# Patient Record
Sex: Female | Born: 1950 | Race: White | Hispanic: No | Marital: Married | Smoking: Former smoker
Health system: Southern US, Community
[De-identification: ages and names within clinical notes are randomized; demographics above are authoritative.]

## PROBLEM LIST (undated history)

## (undated) DIAGNOSIS — J449 Chronic obstructive pulmonary disease, unspecified: Secondary | ICD-10-CM

## (undated) DIAGNOSIS — K5090 Crohn's disease, unspecified, without complications: Secondary | ICD-10-CM

## (undated) DIAGNOSIS — I10 Essential (primary) hypertension: Secondary | ICD-10-CM

## (undated) DIAGNOSIS — M549 Dorsalgia, unspecified: Secondary | ICD-10-CM

## (undated) DIAGNOSIS — J45909 Unspecified asthma, uncomplicated: Secondary | ICD-10-CM

## (undated) HISTORY — PX: CATARACT EXTRACTION, BILATERAL: SHX1313

## (undated) HISTORY — PX: FRACTURE SURGERY: SHX138

## (undated) HISTORY — PX: ABDOMINAL HYSTERECTOMY: SHX81

---

## 2000-11-02 ENCOUNTER — Emergency Department (HOSPITAL_COMMUNITY): Admission: EM | Admit: 2000-11-02 | Discharge: 2000-11-02 | Payer: Self-pay | Admitting: Emergency Medicine

## 2002-05-01 ENCOUNTER — Emergency Department (HOSPITAL_COMMUNITY): Admission: EM | Admit: 2002-05-01 | Discharge: 2002-05-01 | Payer: Self-pay | Admitting: Emergency Medicine

## 2002-05-01 ENCOUNTER — Encounter: Payer: Self-pay | Admitting: Emergency Medicine

## 2002-05-24 ENCOUNTER — Encounter: Admission: RE | Admit: 2002-05-24 | Discharge: 2002-05-24 | Payer: Self-pay | Admitting: Family Medicine

## 2002-05-24 ENCOUNTER — Encounter: Payer: Self-pay | Admitting: Family Medicine

## 2002-07-02 ENCOUNTER — Encounter: Admission: RE | Admit: 2002-07-02 | Discharge: 2002-07-02 | Payer: Self-pay | Admitting: Occupational Medicine

## 2002-07-02 ENCOUNTER — Encounter: Payer: Self-pay | Admitting: Occupational Medicine

## 2002-08-05 ENCOUNTER — Encounter: Payer: Self-pay | Admitting: Occupational Medicine

## 2002-08-05 ENCOUNTER — Encounter: Admission: RE | Admit: 2002-08-05 | Discharge: 2002-08-05 | Payer: Self-pay | Admitting: Occupational Medicine

## 2002-08-21 ENCOUNTER — Encounter: Payer: Self-pay | Admitting: Occupational Medicine

## 2002-08-21 ENCOUNTER — Encounter: Admission: RE | Admit: 2002-08-21 | Discharge: 2002-08-21 | Payer: Self-pay | Admitting: Occupational Medicine

## 2002-09-09 ENCOUNTER — Encounter: Admission: RE | Admit: 2002-09-09 | Discharge: 2002-10-02 | Payer: Self-pay | Admitting: Occupational Medicine

## 2002-10-24 ENCOUNTER — Encounter: Admission: RE | Admit: 2002-10-24 | Discharge: 2002-10-24 | Payer: Self-pay | Admitting: Occupational Medicine

## 2002-10-24 ENCOUNTER — Encounter: Payer: Self-pay | Admitting: Occupational Medicine

## 2002-11-08 ENCOUNTER — Encounter: Payer: Self-pay | Admitting: Neurosurgery

## 2002-11-08 ENCOUNTER — Ambulatory Visit (HOSPITAL_COMMUNITY): Admission: RE | Admit: 2002-11-08 | Discharge: 2002-11-08 | Payer: Self-pay | Admitting: Neurosurgery

## 2009-11-05 ENCOUNTER — Ambulatory Visit: Payer: Self-pay | Admitting: Interventional Radiology

## 2009-11-05 ENCOUNTER — Emergency Department (HOSPITAL_BASED_OUTPATIENT_CLINIC_OR_DEPARTMENT_OTHER): Admission: EM | Admit: 2009-11-05 | Discharge: 2009-11-05 | Payer: Self-pay | Admitting: Emergency Medicine

## 2010-12-16 LAB — CBC
HCT: 39.4 % (ref 36.0–46.0)
MCHC: 34.6 g/dL (ref 30.0–36.0)
MCV: 92.9 fL (ref 78.0–100.0)
Platelets: 291 10*3/uL (ref 150–400)
RDW: 12 % (ref 11.5–15.5)
WBC: 12.8 10*3/uL — ABNORMAL HIGH (ref 4.0–10.5)

## 2010-12-16 LAB — DIFFERENTIAL
Basophils Relative: 1 % (ref 0–1)
Eosinophils Absolute: 0.1 10*3/uL (ref 0.0–0.7)
Eosinophils Relative: 1 % (ref 0–5)
Neutrophils Relative %: 73 % (ref 43–77)

## 2010-12-16 LAB — BASIC METABOLIC PANEL
Calcium: 8.7 mg/dL (ref 8.4–10.5)
GFR calc Af Amer: >60 mL/min (ref >60)
GFR calc non Af Amer: >60 mL/min (ref >60)
Sodium: 142 mEq/L (ref 135–145)

## 2016-07-13 ENCOUNTER — Emergency Department (HOSPITAL_COMMUNITY): Payer: BLUE CROSS/BLUE SHIELD

## 2016-07-13 ENCOUNTER — Encounter (HOSPITAL_COMMUNITY): Payer: Self-pay | Admitting: Emergency Medicine

## 2016-07-13 ENCOUNTER — Observation Stay (HOSPITAL_COMMUNITY)
Admission: EM | Admit: 2016-07-13 | Discharge: 2016-07-15 | Disposition: A | Discharge status: Home / Self Care | Payer: BLUE CROSS/BLUE SHIELD | Attending: Internal Medicine | Admitting: Internal Medicine

## 2016-07-13 DIAGNOSIS — J9602 Acute respiratory failure with hypercapnia: Secondary | ICD-10-CM

## 2016-07-13 DIAGNOSIS — Z79899 Other long term (current) drug therapy: Secondary | ICD-10-CM | POA: Insufficient documentation

## 2016-07-13 DIAGNOSIS — J45909 Unspecified asthma, uncomplicated: Secondary | ICD-10-CM | POA: Diagnosis not present

## 2016-07-13 DIAGNOSIS — Z7982 Long term (current) use of aspirin: Secondary | ICD-10-CM | POA: Diagnosis not present

## 2016-07-13 DIAGNOSIS — K5090 Crohn's disease, unspecified, without complications: Secondary | ICD-10-CM | POA: Insufficient documentation

## 2016-07-13 DIAGNOSIS — F17200 Nicotine dependence, unspecified, uncomplicated: Secondary | ICD-10-CM | POA: Insufficient documentation

## 2016-07-13 DIAGNOSIS — J9621 Acute and chronic respiratory failure with hypoxia: Principal | ICD-10-CM | POA: Insufficient documentation

## 2016-07-13 DIAGNOSIS — I10 Essential (primary) hypertension: Secondary | ICD-10-CM

## 2016-07-13 DIAGNOSIS — E785 Hyperlipidemia, unspecified: Secondary | ICD-10-CM | POA: Diagnosis not present

## 2016-07-13 DIAGNOSIS — J441 Chronic obstructive pulmonary disease with (acute) exacerbation: Secondary | ICD-10-CM | POA: Diagnosis present

## 2016-07-13 DIAGNOSIS — J9601 Acute respiratory failure with hypoxia: Secondary | ICD-10-CM | POA: Diagnosis present

## 2016-07-13 HISTORY — DX: Crohn's disease, unspecified, without complications: K50.90

## 2016-07-13 HISTORY — DX: Essential (primary) hypertension: I10

## 2016-07-13 HISTORY — DX: Unspecified asthma, uncomplicated: J45.909

## 2016-07-13 HISTORY — DX: Chronic obstructive pulmonary disease, unspecified: J44.9

## 2016-07-13 HISTORY — DX: Dorsalgia, unspecified: M54.9

## 2016-07-13 NOTE — ED Triage Notes (Signed)
Pt transported from home by [facility] for c/o shob all day worsening tonight. Pt has used MDI at home several times without relief.  EMS noted significant exp wheezing pt states CPAP has helped her in the past, pt placed on CPAP enroute. IV est #18 R AC, Mag 2gm, Solumedrol 125mg  given. Albuterol 10/Atr .5 given.  Pt states she is feeling better on arrival.

## 2016-07-13 NOTE — ED Provider Notes (Signed)
[facility]-EMERGENCY DEPT Provider Note   CSN: [id number] Arrival date & time: 07/13/16  2257   By signing my name below, I, Freida Busman, attest that this documentation has been prepared under the direction and in the presence of Tomasita Crumble, MD . Electronically Signed: Freida Busman, Scribe. 07/13/2016. 11:39 PM.   History   Chief Complaint Chief Complaint  Patient presents with  . Shortness of Breath    The history is provided by the patient and the EMS personnel. No language interpreter was used.     HPI Comments:  Sally [name] is a 65 y.o. female with a history of COPD who presents to the Emergency Department via EMS complaining of gradually worsening SOB which began today. She has associated wheezing and cough. Pt used  4 puffs of her inhaler without relief. EMS reports oxygen saturation of 93% on fire departmenst O2 upon EMS arrival to the patient. Pt was given magnesium, solumedrol, albuterol, and Atrovent en route and was placed on CPAP with moderate improvement. Pt denies fever. Her only other complaint at this time is burning abdominal pain.    Past Medical History:  Diagnosis Date  . Asthma   . Back pain   . COPD (chronic obstructive pulmonary disease) (HCC)   . Crohn disease (HCC)   . Hypertension     There are no active problems to display for this patient.   Past Surgical History:  Procedure Laterality Date  . ABDOMINAL HYSTERECTOMY    . CATARACT EXTRACTION, BILATERAL    . FRACTURE SURGERY Left    ARM    OB History    No data available       Home Medications    Prior to Admission medications   Not on File    Family History No family history on file.  Social History Social History  Substance Use Topics  . Smoking status: Current Every Day Smoker  . Smokeless tobacco: Never Used  . Alcohol use No     Allergies   Review of patient's allergies indicates no known allergies.   Review of Systems Review of Systems 10 systems reviewed and  all are negative for acute change except as noted in the HPI.  Physical Exam Updated Vital Signs BP 138/76 (BP Location: Right Arm)   Pulse 104   Temp 98.8 F (37.1 C) (Axillary)   Resp (!) 29   Ht 5\' 1"  (1.549 m)   Wt 168 lb (76.2 kg)   SpO2 100%   BMI 31.74 kg/m   Physical Exam  Constitutional: She is oriented to person, place, and time. She appears well-developed and well-nourished. No distress.  HENT:  Head: Normocephalic and atraumatic.  Nose: Nose normal.  Mouth/Throat: Oropharynx is clear and moist. No oropharyngeal exudate.  Eyes: Conjunctivae and EOM are normal. Pupils are equal, round, and reactive to light. No scleral icterus.  Neck: Normal range of motion. Neck supple. No JVD present. No tracheal deviation present. No thyromegaly present.  Cardiovascular: Regular rhythm and normal heart sounds.  Tachycardia present.  Exam reveals no gallop and no friction rub.   No murmur heard. Pulmonary/Chest: Tachypnea noted. She has wheezes. She exhibits no tenderness.  Bipap mask in place Bilateral expiratorywheezing   Abdominal: Soft. Bowel sounds are normal. She exhibits no distension and no mass. There is no tenderness. There is no rebound and no guarding.  Musculoskeletal: Normal range of motion. She exhibits no edema or tenderness.  Lymphadenopathy:    She has no cervical adenopathy.  Neurological:  She is alert and oriented to person, place, and time. No cranial nerve deficit. She exhibits normal muscle tone.  Skin: Skin is warm and dry. No rash noted. No erythema. No pallor.  Nursing note and vitals reviewed.    ED Treatments / Results  DIAGNOSTIC STUDIES:  Oxygen Saturation is 100% on RA, normal by my interpretation.    COORDINATION OF CARE:  11:39 PM Discussed treatment plan with pt at bedside and pt agreed to plan.  Labs (all labs ordered are listed, but only abnormal results are displayed) Labs Reviewed  CBC WITH DIFFERENTIAL/PLATELET  BRAIN NATRIURETIC  PEPTIDE  COMPREHENSIVE METABOLIC PANEL  I-STAT TROPOININ, ED    EKG  EKG Interpretation  Date/Time:  Wednesday July 13 2016 23:09:09 EDT Ventricular Rate:  106 PR Interval:    QRS Duration: 108 QT Interval:  362 QTC Calculation: 481 R Axis:   2 Text Interpretation:  Sinus tachycardia No old tracing to compare Interpretation limited secondary to artifact Confirmed by Erroll Lunani, Rayjon Wery Ayokunle [phone]([id number]) on 07/13/2016 11:12:45 PM       Radiology No results found.  Procedures Procedures (including critical care time)  Medications Ordered in ED Medications - No data to display   Initial Impression / Assessment and Plan / ED Course  I have reviewed the triage vital signs and the nursing notes.  Pertinent labs & imaging results that were available during my care of the patient were reviewed by me and considered in my medical decision making (see chart for details).  Clinical Course    Patient presents to the ED for COPD exacerbation.  She has already received ideal treatment from EMS, including continuous albuterol, ipratropium, solumedrol and magnesium 2g.  She is currently on bipap and appears well but is still wheezing significantly.  Will call hospitalist for admission.  CXR negative for PNA.    2:16 AM [facility]  She is 90% on RA.  97% on 3L [place] .  Will admit to tele for further care.  CRITICAL CARE Performed by: Tomasita CrumbleNI,Mali Eppard   Total critical care time: 40 minutes - respiratory distress on bipap  Critical care time was exclusive of separately billable procedures and treating other patients.  Critical care was necessary to treat or prevent imminent or life-threatening deterioration.  Critical care was time spent personally by me on the following activities: development of treatment plan with patient and/or surrogate as well as nursing, discussions with consultants, evaluation of patient's response to treatment, examination of patient, obtaining history from  patient or surrogate, ordering and performing treatments and interventions, ordering and review of laboratory studies, ordering and review of radiographic studies, pulse oximetry and re-evaluation of patient's condition.   Final Clinical Impressions(s) / ED Diagnoses   Final diagnoses:  None    New Prescriptions New Prescriptions   No medications on file   I personally performed the services described in this documentation, which was scribed in my presence. The recorded information has been reviewed and is accurate.       Tomasita CrumbleAdeleke Marijean Montanye, MD 07/14/16 [phone]

## 2016-07-14 DIAGNOSIS — E785 Hyperlipidemia, unspecified: Secondary | ICD-10-CM

## 2016-07-14 DIAGNOSIS — J9601 Acute respiratory failure with hypoxia: Secondary | ICD-10-CM | POA: Diagnosis not present

## 2016-07-14 DIAGNOSIS — J441 Chronic obstructive pulmonary disease with (acute) exacerbation: Secondary | ICD-10-CM | POA: Diagnosis not present

## 2016-07-14 DIAGNOSIS — J9602 Acute respiratory failure with hypercapnia: Secondary | ICD-10-CM

## 2016-07-14 DIAGNOSIS — I10 Essential (primary) hypertension: Secondary | ICD-10-CM

## 2016-07-14 LAB — CBC WITH DIFFERENTIAL/PLATELET
BASOS ABS: 0 10*3/uL (ref 0.0–0.1)
BASOS PCT: 0 %
EOS ABS: 0.1 10*3/uL (ref 0.0–0.7)
EOS PCT: 1 %
HCT: 41.8 % (ref 36.0–46.0)
HEMOGLOBIN: 14.2 g/dL (ref 12.0–15.0)
LYMPHS ABS: 1.6 10*3/uL (ref 0.7–4.0)
Lymphocytes Relative: 7 %
MCH: 30.4 pg (ref 26.0–34.0)
MCHC: 34 g/dL (ref 30.0–36.0)
MCV: 89.5 fL (ref 78.0–100.0)
Monocytes Absolute: 0.6 10*3/uL (ref 0.1–1.0)
Monocytes Relative: 3 %
NEUTROS PCT: 89 %
Neutro Abs: 18.8 10*3/uL — ABNORMAL HIGH (ref 1.7–7.7)
PLATELETS: 362 10*3/uL (ref 150–400)
RBC: 4.67 MIL/uL (ref 3.87–5.11)
RDW: 13 % (ref 11.5–15.5)
WBC: 21.2 10*3/uL — AB (ref 4.0–10.5)

## 2016-07-14 LAB — COMPREHENSIVE METABOLIC PANEL
ALT: 23 U/L (ref 14–54)
AST: 25 U/L (ref 15–41)
Albumin: 4 g/dL (ref 3.5–5.0)
Alkaline Phosphatase: 151 U/L — ABNORMAL HIGH (ref 38–126)
Anion gap: 10 (ref 5–15)
BUN: 9 mg/dL (ref 6–20)
CHLORIDE: 101 mmol/L (ref 101–111)
CO2: 26 mmol/L (ref 22–32)
Calcium: 9.3 mg/dL (ref 8.9–10.3)
Creatinine, Ser: 0.92 mg/dL (ref 0.44–1.00)
GFR calc Af Amer: >60 mL/min (ref >60)
Glucose, Bld: 188 mg/dL — ABNORMAL HIGH (ref 65–99)
POTASSIUM: 3.6 mmol/L (ref 3.5–5.1)
SODIUM: 137 mmol/L (ref 135–145)
Total Bilirubin: 0.7 mg/dL (ref 0.3–1.2)
Total Protein: 7.7 g/dL (ref 6.5–8.1)

## 2016-07-14 LAB — I-STAT TROPONIN, ED: TROPONIN I, POC: 0.05 ng/mL (ref 0.00–0.08)

## 2016-07-14 LAB — BRAIN NATRIURETIC PEPTIDE: B NATRIURETIC PEPTIDE 5: 38.4 pg/mL (ref 0.0–100.0)

## 2016-07-14 MED ORDER — AZITHROMYCIN 250 MG PO TABS
250.0000 mg | ORAL_TABLET | Freq: Every day | ORAL | Status: DC
  Administered 2016-07-15: 250 mg via ORAL
  Filled 2016-07-14: qty 1

## 2016-07-14 MED ORDER — HYDROXYZINE PAMOATE 25 MG PO CAPS
25.0000 mg | ORAL_CAPSULE | Freq: Three times a day (TID) | ORAL | Status: DC | PRN
  Filled 2016-07-14: qty 1

## 2016-07-14 MED ORDER — INFLUENZA VAC SPLIT QUAD 0.5 ML IM SUSY
0.5000 mL | PREFILLED_SYRINGE | INTRAMUSCULAR | Status: AC
  Administered 2016-07-15: 0.5 mL via INTRAMUSCULAR
  Filled 2016-07-14: qty 0.5

## 2016-07-14 MED ORDER — METHYLPREDNISOLONE SODIUM SUCC 125 MG IJ SOLR
60.0000 mg | Freq: Three times a day (TID) | INTRAMUSCULAR | Status: DC
  Administered 2016-07-14 – 2016-07-15 (×3): 60 mg via INTRAVENOUS
  Filled 2016-07-14 (×3): qty 2

## 2016-07-14 MED ORDER — HYDROXYZINE HCL 25 MG PO TABS: 25.0000 mg | ORAL_TABLET | Freq: Three times a day (TID) | ORAL | Status: DC | PRN

## 2016-07-14 MED ORDER — ATORVASTATIN CALCIUM 10 MG PO TABS
20.0000 mg | ORAL_TABLET | Freq: Every day | ORAL | Status: DC
  Administered 2016-07-14: 20 mg via ORAL
  Filled 2016-07-14: qty 2

## 2016-07-14 MED ORDER — FLUTICASONE PROPIONATE HFA 110 MCG/ACT IN AERO: 2.0000 | INHALATION_SPRAY | Freq: Two times a day (BID) | RESPIRATORY_TRACT | Status: DC

## 2016-07-14 MED ORDER — DILTIAZEM HCL ER COATED BEADS 120 MG PO CP24
240.0000 mg | ORAL_CAPSULE | Freq: Every day | ORAL | Status: DC
  Administered 2016-07-14 – 2016-07-15 (×2): 240 mg via ORAL
  Filled 2016-07-14 (×2): qty 2

## 2016-07-14 MED ORDER — HYDROCODONE-ACETAMINOPHEN 5-325 MG PO TABS
1.0000 | ORAL_TABLET | ORAL | Status: AC | PRN
  Administered 2016-07-14 (×2): 1 via ORAL
  Filled 2016-07-14 (×2): qty 1

## 2016-07-14 MED ORDER — UMECLIDINIUM-VILANTEROL 62.5-25 MCG/INH IN AEPB
1.0000 | INHALATION_SPRAY | Freq: Every day | RESPIRATORY_TRACT | Status: DC
  Filled 2016-07-14: qty 14

## 2016-07-14 MED ORDER — ARFORMOTEROL TARTRATE 15 MCG/2ML IN NEBU
15.0000 ug | INHALATION_SOLUTION | Freq: Two times a day (BID) | RESPIRATORY_TRACT | Status: DC
  Administered 2016-07-14: 15 ug via RESPIRATORY_TRACT
  Filled 2016-07-14: qty 2

## 2016-07-14 MED ORDER — ASPIRIN EC 81 MG PO TBEC
81.0000 mg | DELAYED_RELEASE_TABLET | Freq: Every day | ORAL | Status: DC
  Administered 2016-07-14 – 2016-07-15 (×2): 81 mg via ORAL
  Filled 2016-07-14 (×2): qty 1

## 2016-07-14 MED ORDER — ACETAMINOPHEN 325 MG PO TABS
650.0000 mg | ORAL_TABLET | Freq: Four times a day (QID) | ORAL | Status: DC | PRN
  Administered 2016-07-14 – 2016-07-15 (×2): 650 mg via ORAL
  Filled 2016-07-14 (×2): qty 2

## 2016-07-14 MED ORDER — IPRATROPIUM-ALBUTEROL 0.5-2.5 (3) MG/3ML IN SOLN
3.0000 mL | Freq: Four times a day (QID) | RESPIRATORY_TRACT | Status: DC
  Administered 2016-07-14 – 2016-07-15 (×4): 3 mL via RESPIRATORY_TRACT
  Filled 2016-07-14 (×4): qty 3

## 2016-07-14 MED ORDER — DILTIAZEM HCL ER 240 MG PO CP24
240.0000 mg | ORAL_CAPSULE | Freq: Every day | ORAL | Status: DC
  Filled 2016-07-14: qty 1

## 2016-07-14 MED ORDER — PREDNISONE 20 MG PO TABS
50.0000 mg | ORAL_TABLET | Freq: Every day | ORAL | Status: DC
  Administered 2016-07-14: 50 mg via ORAL
  Filled 2016-07-14: qty 3

## 2016-07-14 MED ORDER — AZITHROMYCIN 250 MG PO TABS
500.0000 mg | ORAL_TABLET | Freq: Every day | ORAL | Status: AC
  Administered 2016-07-14: 500 mg via ORAL
  Filled 2016-07-14: qty 2

## 2016-07-14 MED ORDER — ALBUTEROL SULFATE (2.5 MG/3ML) 0.083% IN NEBU
2.5000 mg | INHALATION_SOLUTION | Freq: Four times a day (QID) | RESPIRATORY_TRACT | Status: DC | PRN
Start: 2016-07-14 — End: 2016-07-14

## 2016-07-14 MED ORDER — GUAIFENESIN ER 600 MG PO TB12
600.0000 mg | ORAL_TABLET | Freq: Two times a day (BID) | ORAL | Status: DC
  Administered 2016-07-14 – 2016-07-15 (×3): 600 mg via ORAL
  Filled 2016-07-14 (×3): qty 1

## 2016-07-14 MED ORDER — ONDANSETRON HCL 4 MG/2ML IJ SOLN
4.0000 mg | Freq: Four times a day (QID) | INTRAMUSCULAR | Status: DC | PRN
  Administered 2016-07-14 – 2016-07-15 (×2): 4 mg via INTRAVENOUS
  Filled 2016-07-14 (×2): qty 2

## 2016-07-14 MED ORDER — ENOXAPARIN SODIUM 40 MG/0.4ML ~~LOC~~ SOLN
40.0000 mg | SUBCUTANEOUS | Status: DC
  Administered 2016-07-14 – 2016-07-15 (×2): 40 mg via SUBCUTANEOUS
  Filled 2016-07-14 (×2): qty 0.4

## 2016-07-14 MED ORDER — ONDANSETRON HCL 4 MG/2ML IJ SOLN
4.0000 mg | Freq: Once | INTRAMUSCULAR | Status: AC
  Administered 2016-07-14: 4 mg via INTRAVENOUS
  Filled 2016-07-14: qty 2

## 2016-07-14 MED ORDER — ALBUTEROL SULFATE (2.5 MG/3ML) 0.083% IN NEBU: 2.5000 mg | INHALATION_SOLUTION | RESPIRATORY_TRACT | Status: DC | PRN

## 2016-07-14 MED ORDER — BUDESONIDE 0.5 MG/2ML IN SUSP
0.5000 mg | Freq: Two times a day (BID) | RESPIRATORY_TRACT | Status: DC
  Administered 2016-07-14 – 2016-07-15 (×3): 0.5 mg via RESPIRATORY_TRACT
  Filled 2016-07-14 (×3): qty 2

## 2016-07-14 MED ORDER — UMECLIDINIUM BROMIDE 62.5 MCG/INH IN AEPB
1.0000 | INHALATION_SPRAY | Freq: Every day | RESPIRATORY_TRACT | Status: DC
  Administered 2016-07-14: 1 via RESPIRATORY_TRACT
  Filled 2016-07-14: qty 7

## 2016-07-14 MED ORDER — ORAL CARE MOUTH RINSE
15.0000 mL | Freq: Two times a day (BID) | OROMUCOSAL | Status: DC
  Administered 2016-07-14: 15 mL via OROMUCOSAL

## 2016-07-14 NOTE — ED Notes (Signed)
Dr. Gardner at bedside 

## 2016-07-14 NOTE — ED Notes (Signed)
Patient stated she is feeling better and wants to go home  Dr Mora Bellman aware and he removed her oxygen to see if she de sats

## 2016-07-14 NOTE — ED Notes (Signed)
Patient nauseated at this time 

## 2016-07-14 NOTE — Progress Notes (Signed)
PROGRESS NOTE        PATIENT DETAILS Name: Sally [name] Age: 65 y.o. Sex: female Date of Birth: March 17, 1951 Admit Date: 07/13/2016 Admitting Physician [name], DO PCP:Pcp Not In System  Brief Narrative: Patient is a 65 y.o. female with history of COPD,HTN,'s, presented with worsening shortness of breath, thought to have COPD exacerbation. Initially  required BiPAP in the emergency room Admitted and started on bronchodilators, steroids and empiric Zithromax.   Subjective: Feels much better. Shortness of breath has markedly improved  No chest pain No nausea  Assessment/Plan: Acute respiratory failure with hypoxia due to COPD exacerbation: Markedly improved, claims that she is almost back to her baseline. Titrated off oxygen this morning. Still some wheezing. We will continue steroids-but changed to Solu-Medrol, continue nebulized bronchodilators and Zithromax. Suspect that if clinical improvement continues, she should be able to go home tomorrow morning. May need to assess for home O2 requirement on discharge.  Hypertension: Controlled, Continue Cardizem.  Dyslipidemia: Continue statin  Tobacco abuse: Counseled.  DVT Prophylaxis: Lovenox  Code Status: Full code  Family Communication: Spouse at bedside  Disposition Plan: Home 10/20  Antimicrobial agents: Azythromycin  Procedures: None  CONSULTS: None  Time spent: 40 minutes  MEDICATIONS: Anti-infectives    Start     Dose/Rate Route Frequency Ordered Stop   07/15/16 1000  azithromycin (ZITHROMAX) tablet 250 mg     250 mg Oral Daily 07/14/16 0302 07/19/16 0959   07/14/16 0600  azithromycin (ZITHROMAX) tablet 500 mg     500 mg Oral Daily 07/14/16 0302 07/14/16 0528      Scheduled Meds: . aspirin EC  81 mg Oral Daily  . atorvastatin  20 mg Oral QHS  . [START ON 07/15/2016] azithromycin  250 mg Oral Daily  . budesonide (PULMICORT) nebulizer solution  0.5 mg Nebulization BID   . diltiazem  240 mg Oral Daily  . enoxaparin (LOVENOX) injection  40 mg Subcutaneous Q24H  . guaiFENesin  600 mg Oral BID  . [START ON 07/15/2016] Influenza vac split quadrivalent PF  0.5 mL Intramuscular Tomorrow-1000  . ipratropium-albuterol  3 mL Nebulization Q6H  . mouth rinse  15 mL Mouth Rinse BID  . methylPREDNISolone (SOLU-MEDROL) injection  60 mg Intravenous Q8H   Continuous Infusions:  PRN Meds:.acetaminophen, albuterol, HYDROcodone-acetaminophen, hydrOXYzine, ondansetron (ZOFRAN) IV   PHYSICAL EXAM: Vital signs: Vitals:   07/14/16 0330 07/14/16 0406 07/14/16 0835 07/14/16 0935  BP: 129/70 (!) 152/55  123/89  Pulse: 94 95  90  Resp: 23 (!) 21  20  Temp:  98.5 F (36.9 C)  97.7 F (36.5 C)  TempSrc:  Oral  Oral  SpO2: 94% 93% 94% 90%  Weight:  76.2 kg (167 lb 15.9 oz)    Height:  5\' 1"  (1.549 m)     Filed Weights   07/13/16 2315 07/14/16 0406  Weight: 76.2 kg (168 lb) 76.2 kg (167 lb 15.9 oz)   Body mass index is 31.74 kg/m.   General appearance :Awake, alert, not in any distress. Speech Clear. Not toxic Looking Eyes:, pupils equally reactive to light and accomodation,no scleral icterus.Pink conjunctiva HEENT: Atraumatic and Normocephalic Neck: supple, no JVD. No cervical lymphadenopathy. No thyromegaly Resp: Bilateral wheezing. Good air entry. CVS: S1 S2 regular, no murmurs.  GI: Bowel sounds present, Non tender and not distended with no gaurding, rigidity or rebound.No organomegaly  Extremities: B/L Lower Ext shows no edema, both legs are warm to touch Neurology:  speech clear,Non focal, sensation is grossly intact. Psychiatric: Normal judgment and insight. Alert and oriented x 3. Normal mood. Musculoskeletal: No digital cyanosis Skin:No Rash, warm and dry Wounds:N/A  I have personally reviewed following labs and imaging studies  LABORATORY DATA: CBC:  Recent Labs Lab 07/13/16 2311  WBC 21.2*  NEUTROABS 18.8*  HGB 14.2  HCT 41.8  MCV 89.5  PLT  362    Basic Metabolic Panel:  Recent Labs Lab 07/13/16 2311  NA 137  K 3.6  CL 101  CO2 26  GLUCOSE 188*  BUN 9  CREATININE 0.92  CALCIUM 9.3    GFR: Estimated Creatinine Clearance: 57.7 mL/min (by C-G formula based on SCr of 0.92 mg/dL).  Liver Function Tests:  Recent Labs Lab 07/13/16 2311  AST 25  ALT 23  ALKPHOS 151*  BILITOT 0.7  PROT 7.7  ALBUMIN 4.0   No results for input(s): LIPASE, AMYLASE in the last 168 hours. No results for input(s): AMMONIA in the last 168 hours.  Coagulation Profile: No results for input(s): INR, PROTIME in the last 168 hours.  Cardiac Enzymes: No results for input(s): CKTOTAL, CKMB, CKMBINDEX, TROPONINI in the last 168 hours.  BNP (last 3 results) No results for input(s): PROBNP in the last 8760 hours.  HbA1C: No results for input(s): HGBA1C in the last 72 hours.  CBG: No results for input(s): GLUCAP in the last 168 hours.  Lipid Profile: No results for input(s): CHOL, HDL, LDLCALC, TRIG, CHOLHDL, LDLDIRECT in the last 72 hours.  Thyroid Function Tests: No results for input(s): TSH, T4TOTAL, FREET4, T3FREE, THYROIDAB in the last 72 hours.  Anemia Panel: No results for input(s): VITAMINB12, FOLATE, FERRITIN, TIBC, IRON, RETICCTPCT in the last 72 hours.  Urine analysis: No results found for: COLORURINE, APPEARANCEUR, LABSPEC, PHURINE, GLUCOSEU, HGBUR, BILIRUBINUR, KETONESUR, PROTEINUR, UROBILINOGEN, NITRITE, LEUKOCYTESUR  Sepsis Labs: Lactic Acid, Venous No results found for: LATICACIDVEN  MICROBIOLOGY: No results found for this or any previous visit (from the past 240 hour(s)).  RADIOLOGY STUDIES/RESULTS: Dg Chest Port 1 View  Result Date: 07/13/2016 CLINICAL DATA:  Dyspnea EXAM: PORTABLE CHEST 1 VIEW COMPARISON:  08/31/2015 FINDINGS: The heart size and mediastinal contours are within normal limits. The lungs are hyperinflated without pneumonic consolidation, pneumothorax nor CHF. No pleural effusion. Partially  visualized left humeral intramedullary nail. No acute osseous abnormality. IMPRESSION: Hyperinflated lungs without acute infiltrate or CHF. Electronically Signed   By: Tollie Ethavid  Kwon M.D.   On: 07/13/2016 23:51     LOS: 0 days   Gerald DexterLogan Scherer, PA-S  [facility] Pager:[phone]  If 7PM-7AM, please contact night-coverage [email or web address] Password [email or web address] 07/14/2016, 11:47 AM  Attending MD note  Patient was seen, examined,treatment plan was discussed with the PA-S.  I have personally reviewed the clinical findings, lab, imaging studies and management of this patient in detail. I agree with the documentation, as recorded by the PA-S   Patient is markedly improved, claims she is almost back to her baseline. Still with some wheezing but moving air well.  On Exam: Gen. exam: Awake, alert, not in any distress Chest: Good air entry bilaterally, no rhonchi or rales CVS: S1-S2 regular, no murmurs Abdomen: Soft, nontender and nondistended Neurology: Non-focal Skin: No rash or lesions  Plan Continue steroids/bronchodilators/Zithromax for another day-suspect that if clinical improvement continues she will be ready to discharge tomorrow morning  Rest as above  [facility]IM[facility],Lenette Rau [facility]

## 2016-07-14 NOTE — Progress Notes (Signed)
Taken off of Bipap at this time and placed on nasal cannula at 3L and pt tolerating well. RT to monitor as needed.

## 2016-07-14 NOTE — ED Notes (Addendum)
Patient sats 88-89% on RA  [place] 2 liters reapplied  [place] sandwich, etc and hot chocolate given upon request

## 2016-07-14 NOTE — Progress Notes (Addendum)
New Admission Note:  Arrival Method: Stretcher from ED Mental Orientation: A&O x4 Telemetry: [address], CCMD notified with second verifier Assessment: Completed Skin: Assessed with Burley Saver, RN, intact with multiple moles on back IV: R AC, SL Pain: 6/10, lower back, on call notified for pain medication Tubes: 2L of oxygen via nasal cannula Safety Measures: Safety Fall Prevention Plan was given, discussed and signed. Admission: Completed. RN unable to complete Abuse/Neglect assessment due to patient's husband at bedside 6 [place] Orientation: Patient has been orientated to the room, unit and the staff. Family: Husband at bedside.  Patient placed on continuous pulse ox. Orders have been reviewed and implemented. Will continue to monitor the patient. Call light has been placed within reach.  Rivka Barbara BSN, RN  Phone Number: [phone]

## 2016-07-14 NOTE — H&P (Addendum)
History and Physical    Sally [name] UJW:[id number] DOB: May 06, 1951 DOA: 07/13/2016   PCP: Pcp Not In System Chief Complaint:  Chief Complaint  Patient presents with  . Shortness of Breath    HPI: Sally [name] is a 65 y.o. female with medical history significant of COPD, HTN.  Patient presents to the ED with c/o cough, SOB, wheezing.  Symptoms onset earlier today, have been worsening.  Other family members have URI illnesses.  Symptoms are persistent and worsening since onset.  Has tried albuterol for symptoms at home without relief.  ED Course: Given mag, solumedrol, albuterol, atrovent, BIPAP.  Has had significant improvement and has now been weaned down to 3L via [place] (although at baseline she does not use oxygen at home).  Review of Systems: As per HPI otherwise 10 point review of systems negative.    Past Medical History:  Diagnosis Date  . Asthma   . Back pain   . COPD (chronic obstructive pulmonary disease) (HCC)   . Crohn disease (HCC)   . Hypertension     Past Surgical History:  Procedure Laterality Date  . ABDOMINAL HYSTERECTOMY    . CATARACT EXTRACTION, BILATERAL    . FRACTURE SURGERY Left    ARM     reports that she has been smoking.  She has never used smokeless tobacco. She reports that she does not drink alcohol or use drugs.  No Known Allergies  No family history on file. No h/o COPD, other family members do seem to have URI symptoms over the past 1 week however.   Prior to Admission medications   Medication Sig Start Date End Date Taking? Authorizing Provider  albuterol (PROVENTIL) (2.5 MG/3ML) 0.083% nebulizer solution Take 2.5 mg by nebulization every 6 (six) hours as needed for wheezing or shortness of breath.  06/03/16  Yes Historical Provider, MD  ANORO ELLIPTA 62.5-25 MCG/INH AEPB Inhale 1 puff into the lungs daily.  06/30/16  Yes Historical Provider, MD  aspirin EC 81 MG tablet Take 81 mg by mouth daily.   Yes Historical Provider, MD    atorvastatin (LIPITOR) 20 MG tablet Take 20 mg by mouth at bedtime. 05/05/16  Yes Historical Provider, MD  DILT-XR 240 MG 24 hr capsule Take 240 mg by mouth daily. 06/30/16  Yes Historical Provider, MD  FLOVENT HFA 110 MCG/ACT inhaler Inhale 2 puffs into the lungs every 12 (twelve) hours.  06/30/16  Yes Historical Provider, MD  hydrOXYzine (VISTARIL) 25 MG capsule Take 25 mg by mouth 3 (three) times daily as needed for anxiety.  05/05/16  Yes Historical Provider, MD    Physical Exam: Vitals:   07/14/16 0130 07/14/16 0145 07/14/16 0200 07/14/16 0215  BP: 121/56 140/63 138/75 131/67  Pulse: 87 84 97 90  Resp: 20 21 25 20   Temp:      TempSrc:      SpO2: 94% 97% 93% 90%  Weight:      Height:          Constitutional: NAD, calm, comfortable Eyes: PERRL, lids and conjunctivae normal ENMT: Mucous membranes are moist. Posterior pharynx clear of any exudate or lesions.Normal dentition.  Neck: normal, supple, no masses, no thyromegaly Respiratory: Diffuse bilateral wheezes Cardiovascular: Regular rate and rhythm, no murmurs / rubs / gallops. No extremity edema. 2+ pedal pulses. No carotid bruits.  Abdomen: no tenderness, no masses palpated. No hepatosplenomegaly. Bowel sounds positive.  Musculoskeletal: no clubbing / cyanosis. No joint deformity upper and lower extremities. Good ROM, no contractures. Normal  muscle tone.  Skin: no rashes, lesions, ulcers. No induration Neurologic: CN 2-12 grossly intact. Sensation intact, DTR normal. Strength 5/5 in all 4.  Psychiatric: Normal judgment and insight. Alert and oriented x 3. Normal mood.    Labs on Admission: I have personally reviewed following labs and imaging studies  CBC:  Recent Labs Lab 07/13/16 2311  WBC 21.2*  NEUTROABS 18.8*  HGB 14.2  HCT 41.8  MCV 89.5  PLT 362   Basic Metabolic Panel:  Recent Labs Lab 07/13/16 2311  NA 137  K 3.6  CL 101  CO2 26  GLUCOSE 188*  BUN 9  CREATININE 0.92  CALCIUM 9.3    GFR: Estimated Creatinine Clearance: 57.7 mL/min (by C-G formula based on SCr of 0.92 mg/dL). Liver Function Tests:  Recent Labs Lab 07/13/16 2311  AST 25  ALT 23  ALKPHOS 151*  BILITOT 0.7  PROT 7.7  ALBUMIN 4.0   No results for input(s): LIPASE, AMYLASE in the last 168 hours. No results for input(s): AMMONIA in the last 168 hours. Coagulation Profile: No results for input(s): INR, PROTIME in the last 168 hours. Cardiac Enzymes: No results for input(s): CKTOTAL, CKMB, CKMBINDEX, TROPONINI in the last 168 hours. BNP (last 3 results) No results for input(s): PROBNP in the last 8760 hours. HbA1C: No results for input(s): HGBA1C in the last 72 hours. CBG: No results for input(s): GLUCAP in the last 168 hours. Lipid Profile: No results for input(s): CHOL, HDL, LDLCALC, TRIG, CHOLHDL, LDLDIRECT in the last 72 hours. Thyroid Function Tests: No results for input(s): TSH, T4TOTAL, FREET4, T3FREE, THYROIDAB in the last 72 hours. Anemia Panel: No results for input(s): VITAMINB12, FOLATE, FERRITIN, TIBC, IRON, RETICCTPCT in the last 72 hours. Urine analysis: No results found for: COLORURINE, APPEARANCEUR, LABSPEC, PHURINE, GLUCOSEU, HGBUR, BILIRUBINUR, KETONESUR, PROTEINUR, UROBILINOGEN, NITRITE, LEUKOCYTESUR Sepsis Labs: @LABRCNTIP (procalcitonin:4,lacticidven:4) )No results found for this or any previous visit (from the past 240 hour(s)).   Radiological Exams on Admission: Dg Chest Port 1 View  Result Date: 07/13/2016 CLINICAL DATA:  Dyspnea EXAM: PORTABLE CHEST 1 VIEW COMPARISON:  08/31/2015 FINDINGS: The heart size and mediastinal contours are within normal limits. The lungs are hyperinflated without pneumonic consolidation, pneumothorax nor CHF. No pleural effusion. Partially visualized left humeral intramedullary nail. No acute osseous abnormality. IMPRESSION: Hyperinflated lungs without acute infiltrate or CHF. Electronically Signed   By: Tollie Eth M.D.   On: 07/13/2016  23:51    EKG: Independently reviewed.  Assessment/Plan Principal Problem:   Acute respiratory failure with hypoxia (HCC) Active Problems:   COPD exacerbation (HCC)    1. COPD exacerbation - causing acute respiratory failure with hypoxia, new oxygen requirement 1. Adult wheeze protocol 2. Prednisone 3. z pak 4. Continuous pulse ox   DVT prophylaxis: Lovenox Code Status: Full Family Communication: Husband at bedside Consults called: None Admission status: Place in [place], [place] Iles. DO [facility] Pager [phone] from 7PM-7AM  If 7AM-7PM, please contact the day physician for the patient [email or web address] Password [email or web address]  07/14/2016, 3:03 AM

## 2016-07-15 DIAGNOSIS — J441 Chronic obstructive pulmonary disease with (acute) exacerbation: Secondary | ICD-10-CM | POA: Diagnosis not present

## 2016-07-15 DIAGNOSIS — J9601 Acute respiratory failure with hypoxia: Secondary | ICD-10-CM | POA: Diagnosis not present

## 2016-07-15 DIAGNOSIS — I10 Essential (primary) hypertension: Secondary | ICD-10-CM | POA: Diagnosis not present

## 2016-07-15 DIAGNOSIS — E785 Hyperlipidemia, unspecified: Secondary | ICD-10-CM | POA: Diagnosis not present

## 2016-07-15 LAB — CBC
HCT: 40.8 % (ref 36.0–46.0)
Hemoglobin: 13.3 g/dL (ref 12.0–15.0)
MCH: 29.8 pg (ref 26.0–34.0)
MCHC: 32.6 g/dL (ref 30.0–36.0)
MCV: 91.3 fL (ref 78.0–100.0)
PLATELETS: 371 10*3/uL (ref 150–400)
RBC: 4.47 MIL/uL (ref 3.87–5.11)
RDW: 13.1 % (ref 11.5–15.5)
WBC: 16 10*3/uL — AB (ref 4.0–10.5)

## 2016-07-15 MED ORDER — PREDNISONE 10 MG PO TABS: ORAL_TABLET | ORAL | 0 refills | Status: DC

## 2016-07-15 MED ORDER — AZITHROMYCIN 250 MG PO TABS: 250.0000 mg | ORAL_TABLET | Freq: Every day | ORAL | 0 refills | Status: DC

## 2016-07-15 NOTE — Care Management Note (Signed)
Case Management Note  Patient Details  Name: Sally [name] MRN: [id number] Date of Birth: 02-12-1951  Subjective/Objective:     CM following for progression and d/c planning.                Action/Plan: 07/15/2016 Noted order for home oxygen, met with pt who has used oxygen previously from [facility] and wishes to use that agency again. This CM notified [facility] of plan for pt to d/c and need for home oxygen, qualifying sats in the notes and pt has diagnosis of COPD. No other HH needs identified.   Expected Discharge Date:    07/15/2016              Expected Discharge Plan:  Home/Self Care  In-House Referral:  NA  Discharge planning Services  CM Consult  Post Acute Care Choice:  Durable Medical Equipment Choice offered to:  Patient  DME Arranged:  Oxygen DME Agency:  [place]:  NA [place] Agency:  NA  Status of Service:  Completed, signed off  If discussed at [place] of Stay Meetings, dates discussed:    Additional Comments:  Adron Bene, RN 07/15/2016, 12:24 PM

## 2016-07-15 NOTE — Final Progress Note (Signed)
Patient discharge teaching given, including activity, diet, follow-up appoints, and medications. Patient verbalized understanding of all discharge instructions. IV access was d/c'd. Vitals are stable. Skin is intact except as charted in most recent assessments. Pt to be escorted out by NT, to be driven home by family.  Chris-RN 

## 2016-07-15 NOTE — Progress Notes (Signed)
SATURATION QUALIFICATIONS: (This note is used to comply with regulatory documentation for home oxygen)  Patient Saturations on Room Air at Rest = 92%  Patient Saturations on Room Air while Ambulating = 85%  Patient Saturations on 2 Liters of oxygen while Ambulating = 94%  Please briefly explain why patient needs home oxygen: Patient desats while ambulating.

## 2016-07-15 NOTE — Discharge Summary (Addendum)
PATIENT DETAILS Name: Sally [name] Age: 65 y.o. Sex: female Date of Birth: 1951-02-06 MRN: [id number]. Admitting Physician: Hillary BowJared M Gardner, DO PCP:Pcp Not In System  Admit Date: 07/13/2016 Discharge date: 07/15/2016  Recommendations for Outpatient Follow-up:  1. Follow up with PCP in 1 week to evaluate COPD exacerbation and HTN 2. Qualifies for  home oxygen 3. Please obtain BMP/CBC in one week  Admitted From:  Home  Disposition: Home   Home Health: No  Equipment/Devices: None  Discharge Condition: Stable  CODE STATUS: FULL CODE  Diet recommendation:  Heart Healthy  Brief Summary: See H&P, Labs, Consult and Test reports for all details. In brief, patient is 65 y.o. female with history of COPD and HTN who presented with worsening shortness of breath, thought to have COPD exacerbation. Initially required BiPAP in the emergency room. Admitted and started on bronchodilators, steroids and empiric Zithromax. Patient has rapidly improved and claims to have returned to baseline.   Brief Hospital Course: Acute on chronic respiratory failure with hypoxia due to COPD exacerbation: Markedly improved. Titrated off oxygen and ambulated yesterday. Claims that she is back to her baseline. Will send home with Zithromax and tapering prednisone. Patient claims she will follow-up with her pulmonologist next week  Chronic hypoxemic respiratory failure: Secondary to long-standing COPD, qualifies for home O2-have ordered. Have counseled patient regarding importance of not smoking while she is on oxygen.  Hypertension: Occasional elevated SBP. Continue Cardizem. Follow-up with PCP in one week.  Dyslipidemia: Stable. Continue statin.  Tobacco abuse: Counseled.  Procedures/Studies: None  Discharge Diagnoses:  Principal Problem:   Acute respiratory failure with hypoxia (HCC) Active Problems:   COPD exacerbation (HCC)   Essential hypertension   Dyslipidemia   Discharge  Instructions:  Activity:  As tolerated with assistance as needed.  Discharge Instructions    Call MD for:  difficulty breathing, headache or visual disturbances    Complete by:  As directed    Diet - low sodium heart healthy    Complete by:  As directed    Increase activity slowly    Complete by:  As directed        Medication List    TAKE these medications   albuterol (2.5 MG/3ML) 0.083% nebulizer solution Commonly known as:  PROVENTIL Take 2.5 mg by nebulization every 6 (six) hours as needed for wheezing or shortness of breath.   ANORO ELLIPTA 62.5-25 MCG/INH Aepb Generic drug:  umeclidinium-vilanterol Inhale 1 puff into the lungs daily.   aspirin EC 81 MG tablet Take 81 mg by mouth daily.   atorvastatin 20 MG tablet Commonly known as:  LIPITOR Take 20 mg by mouth at bedtime.   azithromycin 250 MG tablet Commonly known as:  ZITHROMAX Take 1 tablet (250 mg total) by mouth daily.   DILT-XR 240 MG 24 hr capsule Generic drug:  diltiazem Take 240 mg by mouth daily.   FLOVENT HFA 110 MCG/ACT inhaler Generic drug:  fluticasone Inhale 2 puffs into the lungs every 12 (twelve) hours.   hydrOXYzine 25 MG capsule Commonly known as:  VISTARIL Take 25 mg by mouth 3 (three) times daily as needed for anxiety.   predniSONE 10 MG tablet Commonly known as:  DELTASONE Take 4 tablets (40 mg) daily for 2 days, then, Take 3 tablets (30 mg) daily for 2 days, then, Take 2 tablets (20 mg) daily for 2 days, then, Take 1 tablets (10 mg) daily for 1 days, then stop      Follow-up Information  CABEZA,YURI, MD. Schedule an appointment as soon as possible for a visit in 1 week(s).   Specialty:  Internal Medicine Contact information: [address] [place] [address] [phone]          No Known Allergies  Consultations: None   Other Procedures/Studies: Dg Chest Port 1 View  Result Date: 07/13/2016 CLINICAL DATA:  Dyspnea EXAM: PORTABLE CHEST 1 VIEW  COMPARISON:  08/31/2015 FINDINGS: The heart size and mediastinal contours are within normal limits. The lungs are hyperinflated without pneumonic consolidation, pneumothorax nor CHF. No pleural effusion. Partially visualized left humeral intramedullary nail. No acute osseous abnormality. IMPRESSION: Hyperinflated lungs without acute infiltrate or CHF. Electronically Signed   By: Tollie Eth M.D.   On: 07/13/2016 23:51      TODAY-DAY OF DISCHARGE:  Subjective:   Sally [name] today has no headache, no chest or abdominal pain, no new weakness tingling or numbness, feels much better and back to baseline. Wants to go home today.  Objective:   Blood pressure (!) 158/82, pulse (!) 104, temperature 98.5 F (36.9 C), temperature source Oral, resp. rate 20, height 5\' 1"  (1.549 m), weight 76.3 kg (168 lb 3.4 oz), SpO2 95 %.  Intake/Output Summary (Last 24 hours) at 07/15/16 1018 Last data filed at 07/15/16 0602  Gross per 24 hour  Intake              240 ml  Output                0 ml  Net              240 ml   Filed Weights   07/13/16 2315 07/14/16 0406 07/14/16 2039  Weight: 76.2 kg (168 lb) 76.2 kg (167 lb 15.9 oz) 76.3 kg (168 lb 3.4 oz)    Exam: Awake Alert, Oriented *3, No new F.N deficits, Normal affect [place].AT,PERRAL Supple Neck,No JVD, No cervical lymphadenopathy appriciated.  Symmetrical chest wall movement. Good air movement bilaterally. Few scattered rhonchi. RRR,No Gallops,Rubs or new Murmurs, No Parasternal Heave +ve B.Sounds, Abd Soft, Non tender, No organomegaly appriciated, No rebound -guarding or rigidity. No Cyanosis, Clubbing or edema, No new Rash or bruise   PERTINENT RADIOLOGIC STUDIES: Dg Chest Port 1 View  Result Date: 07/13/2016 CLINICAL DATA:  Dyspnea EXAM: PORTABLE CHEST 1 VIEW COMPARISON:  08/31/2015 FINDINGS: The heart size and mediastinal contours are within normal limits. The lungs are hyperinflated without pneumonic consolidation, pneumothorax nor CHF.  No pleural effusion. Partially visualized left humeral intramedullary nail. No acute osseous abnormality. IMPRESSION: Hyperinflated lungs without acute infiltrate or CHF. Electronically Signed   By: Tollie Eth M.D.   On: 07/13/2016 23:51     PERTINENT LAB RESULTS: CBC:  Recent Labs  07/13/16 2311 07/15/16 0639  WBC 21.2* 16.0*  HGB 14.2 13.3  HCT 41.8 40.8  PLT 362 371   CMET CMP     Component Value Date/Time   NA 137 07/13/2016 2311   K 3.6 07/13/2016 2311   CL 101 07/13/2016 2311   CO2 26 07/13/2016 2311   GLUCOSE 188 (H) 07/13/2016 2311   BUN 9 07/13/2016 2311   CREATININE 0.92 07/13/2016 2311   CALCIUM 9.3 07/13/2016 2311   PROT 7.7 07/13/2016 2311   ALBUMIN 4.0 07/13/2016 2311   AST 25 07/13/2016 2311   ALT 23 07/13/2016 2311   ALKPHOS 151 (H) 07/13/2016 2311   BILITOT 0.7 07/13/2016 2311   GFRNONAA >60 07/13/2016 2311   GFRAA >60 07/13/2016  2311    GFR Estimated Creatinine Clearance: 57.7 mL/min (by C-G formula based on SCr of 0.92 mg/dL). No results for input(s): LIPASE, AMYLASE in the last 72 hours. No results for input(s): CKTOTAL, CKMB, CKMBINDEX, TROPONINI in the last 72 hours. Invalid input(s): POCBNP No results for input(s): DDIMER in the last 72 hours. No results for input(s): HGBA1C in the last 72 hours. No results for input(s): CHOL, HDL, LDLCALC, TRIG, CHOLHDL, LDLDIRECT in the last 72 hours. No results for input(s): TSH, T4TOTAL, T3FREE, THYROIDAB in the last 72 hours.  Invalid input(s): FREET3 No results for input(s): VITAMINB12, FOLATE, FERRITIN, TIBC, IRON, RETICCTPCT in the last 72 hours. Coags: No results for input(s): INR in the last 72 hours.  Invalid input(s): PT Microbiology: No results found for this or any previous visit (from the past 240 hour(s)).  FURTHER DISCHARGE INSTRUCTIONS:  Get Medicines reviewed and adjusted: Please take all your medications with you for your next visit with your Primary MD  Laboratory/radiological  data: Please request your Primary MD to go over all hospital tests and procedure/radiological results at the follow up, please ask your Primary MD to get all Hospital records sent to his/her office.  In some cases, they will be blood work, cultures and biopsy results pending at the time of your discharge. Please request that your primary care M.D. goes through all the records of your hospital data and follows up on these results.  Also Note the following: If you experience worsening of your admission symptoms, develop shortness of breath, life threatening emergency, suicidal or homicidal thoughts you must seek medical attention immediately by calling 911 or calling your MD immediately  if symptoms less severe.  You must read complete instructions/literature along with all the possible adverse reactions/side effects for all the Medicines you take and that have been prescribed to you. Take any new Medicines after you have completely understood and accpet all the possible adverse reactions/side effects.   Do not drive when taking Pain medications or sleeping medications (Benzodaizepines)  Do not take more than prescribed Pain, Sleep and Anxiety Medications. It is not advisable to combine anxiety,sleep and pain medications without talking with your primary care practitioner  Special Instructions: If you have smoked or chewed Tobacco  in the last 2 yrs please stop smoking, stop any regular Alcohol  and or any Recreational drug use.  Wear Seat belts while driving.  Please note: You were cared for by a hospitalist during your hospital stay. Once you are discharged, your primary care physician will handle any further medical issues. Please note that NO REFILLS for any discharge medications will be authorized once you are discharged, as it is imperative that you return to your primary care physician (or establish a relationship with a primary care physician if you do not have one) for your post hospital  discharge needs so that they can reassess your need for medications and monitor your lab values.  Total Time spent coordinating discharge including counseling, education and face to face time equals 25 minutes.  Signed: Gerald Dexter PA-S 07/15/2016 10:18 AM   Attending MD note  Patient was seen, examined,treatment plan was discussed with the PA-S.  I have personally reviewed the clinical findings, lab, imaging studies and management of this patient in detail. I agree with the documentation, as recorded by the PA-S.   Patient is much better-claims she is back to her baseline, she claims that she has ambulated in the hallway and did not have any shortness of  breath. On exam, she appears very comfortable. It is good air entry bilaterally, only a few scattered rhonchi.  On Exam: Gen. exam: Awake, alert, not in any distress Chest: Good air entry bilaterally, few scattered rhonchi CVS: S1-S2 regular, no murmurs Abdomen: Soft, nontender and nondistended Neurology: Non-focal Skin: No rash or lesions  Plan Discharge home today Taper prednisone over a week Continue her usual bronchodilator regimen She indicates to me that she will follow up with her primary pulmonologist in [facility] next week  Rest as above  [facility]

## 2016-07-15 NOTE — Progress Notes (Signed)
Pt ambulated a short distance down the hall and oxygen saturation drop form 95% to 86%  on room air.  Pt was given 2L oxygen via nasal cannula.  Oxygen saturation rose back to 95%.

## 2016-09-29 ENCOUNTER — Inpatient Hospital Stay (HOSPITAL_BASED_OUTPATIENT_CLINIC_OR_DEPARTMENT_OTHER)
Admission: EM | Admit: 2016-09-29 | Discharge: 2016-10-06 | DRG: 202 | Disposition: A | Discharge status: Home / Self Care | Payer: BLUE CROSS/BLUE SHIELD | Attending: Internal Medicine | Admitting: Internal Medicine

## 2016-09-29 ENCOUNTER — Emergency Department (HOSPITAL_BASED_OUTPATIENT_CLINIC_OR_DEPARTMENT_OTHER): Payer: BLUE CROSS/BLUE SHIELD

## 2016-09-29 DIAGNOSIS — Z79899 Other long term (current) drug therapy: Secondary | ICD-10-CM

## 2016-09-29 DIAGNOSIS — Z7952 Long term (current) use of systemic steroids: Secondary | ICD-10-CM

## 2016-09-29 DIAGNOSIS — E785 Hyperlipidemia, unspecified: Secondary | ICD-10-CM | POA: Diagnosis present

## 2016-09-29 DIAGNOSIS — F17200 Nicotine dependence, unspecified, uncomplicated: Secondary | ICD-10-CM | POA: Diagnosis present

## 2016-09-29 DIAGNOSIS — Z90710 Acquired absence of both cervix and uterus: Secondary | ICD-10-CM

## 2016-09-29 DIAGNOSIS — J9602 Acute respiratory failure with hypercapnia: Secondary | ICD-10-CM | POA: Diagnosis present

## 2016-09-29 DIAGNOSIS — J210 Acute bronchiolitis due to respiratory syncytial virus: Principal | ICD-10-CM | POA: Diagnosis present

## 2016-09-29 DIAGNOSIS — Z7982 Long term (current) use of aspirin: Secondary | ICD-10-CM

## 2016-09-29 DIAGNOSIS — Z8249 Family history of ischemic heart disease and other diseases of the circulatory system: Secondary | ICD-10-CM

## 2016-09-29 DIAGNOSIS — K5900 Constipation, unspecified: Secondary | ICD-10-CM | POA: Diagnosis not present

## 2016-09-29 DIAGNOSIS — K5090 Crohn's disease, unspecified, without complications: Secondary | ICD-10-CM | POA: Diagnosis present

## 2016-09-29 DIAGNOSIS — J9601 Acute respiratory failure with hypoxia: Secondary | ICD-10-CM | POA: Diagnosis present

## 2016-09-29 DIAGNOSIS — J441 Chronic obstructive pulmonary disease with (acute) exacerbation: Secondary | ICD-10-CM | POA: Diagnosis present

## 2016-09-29 DIAGNOSIS — Z7951 Long term (current) use of inhaled steroids: Secondary | ICD-10-CM

## 2016-09-29 DIAGNOSIS — R000 Tachycardia, unspecified: Secondary | ICD-10-CM | POA: Diagnosis present

## 2016-09-29 DIAGNOSIS — T380X5A Adverse effect of glucocorticoids and synthetic analogues, initial encounter: Secondary | ICD-10-CM | POA: Diagnosis present

## 2016-09-29 DIAGNOSIS — J440 Chronic obstructive pulmonary disease with acute lower respiratory infection: Secondary | ICD-10-CM | POA: Diagnosis present

## 2016-09-29 DIAGNOSIS — I10 Essential (primary) hypertension: Secondary | ICD-10-CM | POA: Diagnosis present

## 2016-09-29 LAB — COMPREHENSIVE METABOLIC PANEL
ALBUMIN: 4.1 g/dL (ref 3.5–5.0)
ALK PHOS: 143 U/L — AB (ref 38–126)
ALT: 25 U/L (ref 14–54)
AST: 27 U/L (ref 15–41)
Anion gap: 11 (ref 5–15)
BUN: 10 mg/dL (ref 6–20)
CALCIUM: 8.7 mg/dL — AB (ref 8.9–10.3)
CHLORIDE: 103 mmol/L (ref 101–111)
CO2: 23 mmol/L (ref 22–32)
CREATININE: 0.73 mg/dL (ref 0.44–1.00)
GFR calc Af Amer: >60 mL/min (ref >60)
GFR calc non Af Amer: >60 mL/min (ref >60)
GLUCOSE: 157 mg/dL — AB (ref 65–99)
Potassium: 3.7 mmol/L (ref 3.5–5.1)
SODIUM: 137 mmol/L (ref 135–145)
Total Bilirubin: 0.3 mg/dL (ref 0.3–1.2)
Total Protein: 7.8 g/dL (ref 6.5–8.1)

## 2016-09-29 LAB — CBC
HCT: 42.2 % (ref 36.0–46.0)
HEMOGLOBIN: 14 g/dL (ref 12.0–15.0)
MCH: 29.9 pg (ref 26.0–34.0)
MCHC: 33.2 g/dL (ref 30.0–36.0)
MCV: 90.2 fL (ref 78.0–100.0)
PLATELETS: 328 10*3/uL (ref 150–400)
RBC: 4.68 MIL/uL (ref 3.87–5.11)
RDW: 13.5 % (ref 11.5–15.5)
WBC: 10.7 10*3/uL — AB (ref 4.0–10.5)

## 2016-09-29 LAB — BRAIN NATRIURETIC PEPTIDE: B Natriuretic Peptide: 29.6 pg/mL (ref 0.0–100.0)

## 2016-09-29 LAB — TROPONIN I

## 2016-09-29 MED ORDER — ONDANSETRON HCL 4 MG/2ML IJ SOLN
INTRAMUSCULAR | Status: AC
  Administered 2016-09-29: 4 mg via INTRAVENOUS
  Filled 2016-09-29: qty 2

## 2016-09-29 MED ORDER — ALBUTEROL SULFATE (2.5 MG/3ML) 0.083% IN NEBU
5.0000 mg | INHALATION_SOLUTION | Freq: Once | RESPIRATORY_TRACT | Status: DC
  Filled 2016-09-29: qty 6

## 2016-09-29 MED ORDER — ALBUTEROL SULFATE (2.5 MG/3ML) 0.083% IN NEBU
5.0000 mg | INHALATION_SOLUTION | Freq: Once | RESPIRATORY_TRACT | Status: AC
  Administered 2016-09-29: 5 mg via RESPIRATORY_TRACT
  Filled 2016-09-29: qty 6

## 2016-09-29 MED ORDER — ALBUTEROL SULFATE (2.5 MG/3ML) 0.083% IN NEBU
2.5000 mg | INHALATION_SOLUTION | Freq: Once | RESPIRATORY_TRACT | Status: AC
  Administered 2016-09-29: 2.5 mg via RESPIRATORY_TRACT

## 2016-09-29 MED ORDER — IPRATROPIUM-ALBUTEROL 0.5-2.5 (3) MG/3ML IN SOLN
3.0000 mL | RESPIRATORY_TRACT | Status: DC
  Administered 2016-09-30: 3 mL via RESPIRATORY_TRACT
  Filled 2016-09-29: qty 3

## 2016-09-29 MED ORDER — DOXYCYCLINE HYCLATE 100 MG IV SOLR
INTRAVENOUS | Status: AC
  Filled 2016-09-29: qty 100

## 2016-09-29 MED ORDER — MORPHINE SULFATE (PF) 4 MG/ML IV SOLN
4.0000 mg | Freq: Once | INTRAVENOUS | Status: AC
  Administered 2016-09-29: 4 mg via INTRAVENOUS
  Filled 2016-09-29: qty 1

## 2016-09-29 MED ORDER — ONDANSETRON HCL 4 MG/2ML IJ SOLN
4.0000 mg | Freq: Once | INTRAMUSCULAR | Status: AC
  Administered 2016-09-29: 4 mg via INTRAVENOUS

## 2016-09-29 MED ORDER — METHYLPREDNISOLONE SODIUM SUCC 125 MG IJ SOLR
125.0000 mg | Freq: Once | INTRAMUSCULAR | Status: AC
  Administered 2016-09-29: 125 mg via INTRAVENOUS
  Filled 2016-09-29: qty 2

## 2016-09-29 MED ORDER — ALBUTEROL SULFATE (2.5 MG/3ML) 0.083% IN NEBU
2.5000 mg | INHALATION_SOLUTION | RESPIRATORY_TRACT | Status: DC | PRN
  Administered 2016-09-30: 2.5 mg via RESPIRATORY_TRACT
  Filled 2016-09-29: qty 3

## 2016-09-29 MED ORDER — IPRATROPIUM-ALBUTEROL 0.5-2.5 (3) MG/3ML IN SOLN
3.0000 mL | Freq: Once | RESPIRATORY_TRACT | Status: AC
  Administered 2016-09-29: 3 mL via RESPIRATORY_TRACT
  Filled 2016-09-29: qty 3

## 2016-09-29 MED ORDER — DOXYCYCLINE HYCLATE 100 MG IV SOLR
100.0000 mg | Freq: Once | INTRAVENOUS | Status: AC
  Administered 2016-09-29: 100 mg via INTRAVENOUS
  Filled 2016-09-29: qty 100

## 2016-09-29 NOTE — Progress Notes (Signed)
[facility] transfer discussed with Victoriano Lain  Sally [name] is a 66 year old female with past medical history of HTN, COPD, tobacco abuse, and Crohn's disease; who presented with complaints of 1 week history of productive cough and worsening SOB. O2 saturations as low as 85% on room air, heart rates up to 110, respirations up to 28, blood pressure 178/82.. WBC 10.7. Given 3 breathing treatments and placed on 3 L of nasal cannula oxygen with improvement of O2 sats noted to the low 90s. Chest x-ray negative for acute infiltrate. Abx of Doxycycline IV.  Inpatient admission to telemetry bed for presumed COPD exacerbation.

## 2016-09-29 NOTE — ED Provider Notes (Signed)
[facility] Provider Note   CSN: [id number] Arrival date & time: 09/29/16  2145   By signing my name below, I, Sally [name], attest that this documentation has been prepared under the direction and in the presence of Geoffery Lyons, MD. Electronically Signed: Cynda [name], Scribe. 09/29/16. 10:17 PM.  History   Chief Complaint Chief Complaint  Patient presents with  . Shortness of Breath    HPI Comments: Sally [name] is a 66 y.o. female with a history of COPD, Crohn's, and hypertension, who presents to the Emergency Department complaining of sudden-onset, gradually worsening shortness of breath that began this morning.Pateint has associated nausea with no appetite. Patient states her shortness of breath has progressively worsened throughout the day. Patient used her home nebulizer with no improvement. Patient states the severity as a 10 out of 10. She denies any sputum production, or any other symptoms.   The history is provided by the patient. No language interpreter was used.  Shortness of Breath  This is a chronic problem. The current episode started 6 to 12 hours ago. The problem has been gradually worsening. Associated symptoms include wheezing. Pertinent negatives include no sputum production and no chest pain. It is unknown what precipitated the problem. Associated medical issues include COPD.    Past Medical History:  Diagnosis Date  . Asthma   . Back pain   . COPD (chronic obstructive pulmonary disease) (HCC)   . Crohn disease (HCC)   . Hypertension     Patient Active Problem List   Diagnosis Date Noted  . COPD exacerbation (HCC) 07/14/2016  . Acute respiratory failure with hypoxia (HCC)   . Essential hypertension   . Dyslipidemia     Past Surgical History:  Procedure Laterality Date  . ABDOMINAL HYSTERECTOMY    . CATARACT EXTRACTION, BILATERAL    . FRACTURE SURGERY Left    ARM    OB History    No data available       Home Medications     Prior to Admission medications   Medication Sig Start Date End Date Taking? Authorizing Provider  albuterol (PROVENTIL) (2.5 MG/3ML) 0.083% nebulizer solution Take 2.5 mg by nebulization every 6 (six) hours as needed for wheezing or shortness of breath.  06/03/16   Historical Provider, MD  ANORO ELLIPTA 62.5-25 MCG/INH AEPB Inhale 1 puff into the lungs daily.  06/30/16   Historical Provider, MD  aspirin EC 81 MG tablet Take 81 mg by mouth daily.    Historical Provider, MD  atorvastatin (LIPITOR) 20 MG tablet Take 20 mg by mouth at bedtime. 05/05/16   Historical Provider, MD  azithromycin (ZITHROMAX) 250 MG tablet Take 1 tablet (250 mg total) by mouth daily. 07/15/16   Shanker Levora Dredge, MD  DILT-XR 240 MG 24 hr capsule Take 240 mg by mouth daily. 06/30/16   Historical Provider, MD  FLOVENT HFA 110 MCG/ACT inhaler Inhale 2 puffs into the lungs every 12 (twelve) hours.  06/30/16   Historical Provider, MD  hydrOXYzine (VISTARIL) 25 MG capsule Take 25 mg by mouth 3 (three) times daily as needed for anxiety.  05/05/16   Historical Provider, MD  predniSONE (DELTASONE) 10 MG tablet Take 4 tablets (40 mg) daily for 2 days, then, Take 3 tablets (30 mg) daily for 2 days, then, Take 2 tablets (20 mg) daily for 2 days, then, Take 1 tablets (10 mg) daily for 1 days, then stop 07/15/16   Maretta Bees, MD    Family History No family  history on file.  Social History Social History  Substance Use Topics  . Smoking status: Current Every Day Smoker  . Smokeless tobacco: Never Used  . Alcohol use No     Allergies   Patient has no known allergies.   Review of Systems Review of Systems  Constitutional: Positive for appetite change.  Respiratory: Positive for shortness of breath and wheezing. Negative for sputum production.   Cardiovascular: Negative for chest pain.  All other systems reviewed and are negative.    Physical Exam Updated Vital Signs BP 178/82 (BP Location: Right Arm)   Temp 98  F (36.7 C) (Oral)   Resp (!) 28   Ht 5\' 1"  (1.549 m)   Wt 163 lb (73.9 kg)   SpO2 (!) 84%   BMI 30.80 kg/m   Physical Exam  Constitutional: She is oriented to person, place, and time. She appears well-developed and well-nourished. No distress.  HENT:  Head: Normocephalic and atraumatic.  Eyes: EOM are normal.  Neck: Normal range of motion.  Cardiovascular: Normal rate, regular rhythm and normal heart sounds.   Pulmonary/Chest: She is in respiratory distress. She has wheezes.  Patient with bilateral expiratory wheezes and decreased air movement.  She is in moderate respiratory distress.    Abdominal: Soft. She exhibits no distension. There is no tenderness.  Musculoskeletal: Normal range of motion. She exhibits no edema.  Neurological: She is alert and oriented to person, place, and time.  Skin: Skin is warm and dry.  Psychiatric: She has a normal mood and affect. Judgment normal.  Nursing note and vitals reviewed.    ED Treatments / Results  DIAGNOSTIC STUDIES: Oxygen Saturation is 84% on [place], low by my interpretation.    COORDINATION OF CARE: 10:10 PM Discussed treatment plan with pt at bedside and pt agreed to plan.  Labs (all labs ordered are listed, but only abnormal results are displayed) Labs Reviewed  CBC  COMPREHENSIVE METABOLIC PANEL    EKG  EKG Interpretation None       Radiology No results found.  Procedures Procedures (including critical care time)  Medications Ordered in ED Medications  ipratropium-albuterol (DUONEB) 0.5-2.5 (3) MG/3ML nebulizer solution 3 mL (3 mLs Nebulization Given 09/29/16 2159)  albuterol (PROVENTIL) (2.5 MG/3ML) 0.083% nebulizer solution 5 mg (5 mg Nebulization Given 09/29/16 2158)     Initial Impression / Assessment and Plan / ED Course  I have reviewed the triage vital signs and the nursing notes.  Pertinent labs & imaging results that were available during my care of the patient were reviewed by me and considered in  my medical decision making (see chart for details).  Clinical Course     Patient presents with wheezing and respiratory distress with oxygen saturations of 85%. This appears to be an exacerbation of COPD. She was given several nebulizer treatments as well as steroids and has improved, however does remain hypoxic with some difficulty finishing sentences and persistent wheezing. I feel as though she will require admission for IV steroids and repeat nebs. Dr. Katrinka Blazing agrees to accept in transfer.  CRITICAL CARE Performed by: Geoffery Lyons Total critical care time: 30 minutes Critical care time was exclusive of separately billable procedures and treating other patients. Critical care was necessary to treat or prevent imminent or life-threatening deterioration. Critical care was time spent personally by me on the following activities: development of treatment plan with patient and/or surrogate as well as nursing, discussions with consultants, evaluation of patient's response to treatment, examination of patient,  obtaining history from patient or surrogate, ordering and performing treatments and interventions, ordering and review of laboratory studies, ordering and review of radiographic studies, pulse oximetry and re-evaluation of patient's condition.   Final Clinical Impressions(s) / ED Diagnoses   Final diagnoses:  None    New Prescriptions New Prescriptions   No medications on file   I personally performed the services described in this documentation, which was scribed in my presence. The recorded information has been reviewed and is accurate.        Geoffery Lyons, MD 09/29/16 2325

## 2016-09-29 NOTE — ED Triage Notes (Signed)
Patient is alert and oriented x4.  She is being seen for shortness of breath that started this morning.  She has a history of asthma and COPD.  Patient is having labored breathing with expiatory wheezing on arrival to the ED.  Currently she rates her pain 10 of 10.

## 2016-09-29 NOTE — ED Notes (Signed)
SOB since this am, productive cough, yellow mucus. Last home neb at 1900ish, lower back pain, chronic

## 2016-09-30 ENCOUNTER — Encounter (HOSPITAL_COMMUNITY): Payer: Self-pay | Admitting: Internal Medicine

## 2016-09-30 DIAGNOSIS — J9601 Acute respiratory failure with hypoxia: Secondary | ICD-10-CM | POA: Diagnosis not present

## 2016-09-30 DIAGNOSIS — J441 Chronic obstructive pulmonary disease with (acute) exacerbation: Secondary | ICD-10-CM | POA: Diagnosis not present

## 2016-09-30 DIAGNOSIS — I10 Essential (primary) hypertension: Secondary | ICD-10-CM | POA: Diagnosis not present

## 2016-09-30 DIAGNOSIS — J9602 Acute respiratory failure with hypercapnia: Secondary | ICD-10-CM | POA: Diagnosis not present

## 2016-09-30 LAB — CBC WITH DIFFERENTIAL/PLATELET
Basophils Absolute: 0 10*3/uL (ref 0.0–0.1)
Basophils Relative: 0 %
EOS ABS: 0 10*3/uL (ref 0.0–0.7)
EOS PCT: 0 %
HCT: 39.6 % (ref 36.0–46.0)
Hemoglobin: 13.6 g/dL (ref 12.0–15.0)
LYMPHS ABS: 0.5 10*3/uL — AB (ref 0.7–4.0)
Lymphocytes Relative: 4 %
MCH: 30.6 pg (ref 26.0–34.0)
MCHC: 34.3 g/dL (ref 30.0–36.0)
MCV: 89 fL (ref 78.0–100.0)
MONOS PCT: 0 %
Monocytes Absolute: 0 10*3/uL — ABNORMAL LOW (ref 0.1–1.0)
Neutro Abs: 12 10*3/uL — ABNORMAL HIGH (ref 1.7–7.7)
Neutrophils Relative %: 96 %
PLATELETS: 314 10*3/uL (ref 150–400)
RBC: 4.45 MIL/uL (ref 3.87–5.11)
RDW: 13.2 % (ref 11.5–15.5)
WBC: 12.5 10*3/uL — ABNORMAL HIGH (ref 4.0–10.5)

## 2016-09-30 LAB — BLOOD GAS, ARTERIAL
Acid-Base Excess: 1.3 mmol/L (ref 0.0–2.0)
Bicarbonate: 26.6 mmol/L (ref 20.0–28.0)
Drawn by: 31101
O2 Content: 3 L/min
O2 SAT: 95.6 %
PATIENT TEMPERATURE: 98.6
PO2 ART: 88 mmHg (ref 83.0–108.0)
pCO2 arterial: 52 mmHg — ABNORMAL HIGH (ref 32.0–48.0)
pH, Arterial: 7.329 — ABNORMAL LOW (ref 7.350–7.450)

## 2016-09-30 LAB — COMPREHENSIVE METABOLIC PANEL
ALBUMIN: 3.6 g/dL (ref 3.5–5.0)
ALT: 24 U/L (ref 14–54)
AST: 20 U/L (ref 15–41)
Alkaline Phosphatase: 133 U/L — ABNORMAL HIGH (ref 38–126)
Anion gap: 11 (ref 5–15)
BUN: 9 mg/dL (ref 6–20)
CHLORIDE: 103 mmol/L (ref 101–111)
CO2: 26 mmol/L (ref 22–32)
CREATININE: 0.83 mg/dL (ref 0.44–1.00)
Calcium: 8.9 mg/dL (ref 8.9–10.3)
GFR calc Af Amer: >60 mL/min (ref >60)
Glucose, Bld: 201 mg/dL — ABNORMAL HIGH (ref 65–99)
Potassium: 3.9 mmol/L (ref 3.5–5.1)
SODIUM: 140 mmol/L (ref 135–145)
Total Bilirubin: 0.2 mg/dL — ABNORMAL LOW (ref 0.3–1.2)
Total Protein: 7.3 g/dL (ref 6.5–8.1)

## 2016-09-30 LAB — TROPONIN I

## 2016-09-30 MED ORDER — HYDROXYZINE HCL 25 MG PO TABS
25.0000 mg | ORAL_TABLET | Freq: Three times a day (TID) | ORAL | Status: DC | PRN
  Administered 2016-10-01 – 2016-10-05 (×2): 25 mg via ORAL
  Filled 2016-09-30 (×2): qty 1

## 2016-09-30 MED ORDER — DULOXETINE HCL 30 MG PO CPEP
30.0000 mg | ORAL_CAPSULE | Freq: Every day | ORAL | Status: DC
  Administered 2016-09-30 – 2016-10-06 (×7): 30 mg via ORAL
  Filled 2016-09-30 (×7): qty 1

## 2016-09-30 MED ORDER — IPRATROPIUM-ALBUTEROL 0.5-2.5 (3) MG/3ML IN SOLN: 3.0000 mL | RESPIRATORY_TRACT | Status: DC | PRN

## 2016-09-30 MED ORDER — IPRATROPIUM-ALBUTEROL 0.5-2.5 (3) MG/3ML IN SOLN
3.0000 mL | Freq: Four times a day (QID) | RESPIRATORY_TRACT | Status: DC
  Administered 2016-10-01 – 2016-10-05 (×17): 3 mL via RESPIRATORY_TRACT
  Filled 2016-09-30 (×17): qty 3

## 2016-09-30 MED ORDER — ONDANSETRON HCL 4 MG PO TABS
4.0000 mg | ORAL_TABLET | Freq: Four times a day (QID) | ORAL | Status: DC | PRN
  Administered 2016-09-30 – 2016-10-06 (×5): 4 mg via ORAL
  Filled 2016-09-30 (×5): qty 1

## 2016-09-30 MED ORDER — ONDANSETRON HCL 4 MG/2ML IJ SOLN
4.0000 mg | Freq: Four times a day (QID) | INTRAMUSCULAR | Status: DC | PRN
  Administered 2016-09-30 – 2016-10-04 (×4): 4 mg via INTRAVENOUS
  Filled 2016-09-30 (×5): qty 2

## 2016-09-30 MED ORDER — ENOXAPARIN SODIUM 40 MG/0.4ML ~~LOC~~ SOLN
40.0000 mg | SUBCUTANEOUS | Status: DC
  Administered 2016-09-30 – 2016-10-06 (×7): 40 mg via SUBCUTANEOUS
  Filled 2016-09-30 (×7): qty 0.4

## 2016-09-30 MED ORDER — METHYLPREDNISOLONE SODIUM SUCC 40 MG IJ SOLR
40.0000 mg | Freq: Two times a day (BID) | INTRAMUSCULAR | Status: DC
  Administered 2016-09-30 – 2016-10-01 (×3): 40 mg via INTRAVENOUS
  Filled 2016-09-30 (×3): qty 1

## 2016-09-30 MED ORDER — DOXYCYCLINE HYCLATE 100 MG IV SOLR
100.0000 mg | Freq: Two times a day (BID) | INTRAVENOUS | Status: DC
  Filled 2016-09-30: qty 100

## 2016-09-30 MED ORDER — ASPIRIN EC 81 MG PO TBEC
81.0000 mg | DELAYED_RELEASE_TABLET | Freq: Every day | ORAL | Status: DC
  Administered 2016-09-30 – 2016-10-06 (×7): 81 mg via ORAL
  Filled 2016-09-30 (×7): qty 1

## 2016-09-30 MED ORDER — ACETAMINOPHEN 325 MG PO TABS
650.0000 mg | ORAL_TABLET | Freq: Four times a day (QID) | ORAL | Status: DC | PRN
  Administered 2016-09-30 – 2016-10-05 (×4): 650 mg via ORAL
  Filled 2016-09-30 (×5): qty 2

## 2016-09-30 MED ORDER — ALBUTEROL SULFATE (2.5 MG/3ML) 0.083% IN NEBU
2.5000 mg | INHALATION_SOLUTION | RESPIRATORY_TRACT | Status: DC | PRN
  Administered 2016-10-01 (×3): 2.5 mg via RESPIRATORY_TRACT
  Filled 2016-09-30 (×3): qty 3

## 2016-09-30 MED ORDER — DILTIAZEM HCL ER COATED BEADS 240 MG PO CP24
240.0000 mg | ORAL_CAPSULE | Freq: Every day | ORAL | Status: DC
  Administered 2016-09-30 – 2016-10-06 (×7): 240 mg via ORAL
  Filled 2016-09-30 (×7): qty 1

## 2016-09-30 MED ORDER — TRAMADOL HCL 50 MG PO TABS
50.0000 mg | ORAL_TABLET | Freq: Four times a day (QID) | ORAL | Status: DC | PRN
  Administered 2016-09-30 – 2016-10-06 (×10): 50 mg via ORAL
  Filled 2016-09-30 (×10): qty 1

## 2016-09-30 MED ORDER — ATORVASTATIN CALCIUM 20 MG PO TABS
20.0000 mg | ORAL_TABLET | Freq: Every day | ORAL | Status: DC
  Administered 2016-09-30 – 2016-10-05 (×5): 20 mg via ORAL
  Filled 2016-09-30 (×5): qty 1

## 2016-09-30 MED ORDER — BUDESONIDE 0.25 MG/2ML IN SUSP
0.2500 mg | Freq: Two times a day (BID) | RESPIRATORY_TRACT | Status: DC
  Administered 2016-09-30 – 2016-10-06 (×13): 0.25 mg via RESPIRATORY_TRACT
  Filled 2016-09-30 (×13): qty 2

## 2016-09-30 MED ORDER — DOXYCYCLINE HYCLATE 100 MG PO TABS
100.0000 mg | ORAL_TABLET | Freq: Two times a day (BID) | ORAL | Status: DC
  Administered 2016-09-30 – 2016-10-05 (×10): 100 mg via ORAL
  Filled 2016-09-30 (×11): qty 1

## 2016-09-30 MED ORDER — IPRATROPIUM-ALBUTEROL 0.5-2.5 (3) MG/3ML IN SOLN
3.0000 mL | RESPIRATORY_TRACT | Status: DC
  Administered 2016-09-30 (×4): 3 mL via RESPIRATORY_TRACT
  Filled 2016-09-30 (×4): qty 3

## 2016-09-30 MED ORDER — IPRATROPIUM-ALBUTEROL 0.5-2.5 (3) MG/3ML IN SOLN: 3.0000 mL | Freq: Four times a day (QID) | RESPIRATORY_TRACT | Status: DC

## 2016-09-30 MED ORDER — ACETAMINOPHEN 650 MG RE SUPP: 650.0000 mg | Freq: Four times a day (QID) | RECTAL | Status: DC | PRN

## 2016-09-30 NOTE — Care Management Note (Addendum)
Case Management Note Donn Pierini RN, BSN Unit 2W-Case Manager [phone]  Patient Details  Name: Sally [name] MRN: [id number] Date of Birth: 04/19/1951  Subjective/Objective:  Pt admitted with PNA/COPD                  Action/Plan: PTA pt lived at home with spouse- referral received for possible medication needs- spoke with pt at bedside- per pt she does not use home 02- however does have a nebulizer at home- no other DME. PCP- Andreas Blower. -- per pt she had BCBS up until first of the year- but as of Jan. 1- does not have insurance currently- pt will turn 66 next month and plans to sign up for Medicare- pt also asked about Medicaid- informed pt she could go to DSS and speak with someone about applying for Medicaid. Went over pt's medications with her- and she states that she has medications at home but is getting low on her Cymbalta. Uses [facility] -- checked pt's Hospital account notes- and per notes pt's insurance has been verified- with effective date of 09/26/16- will speak with pt again regarding insurance- as pt has active insurance would not qualify for [facility] program for any kind of medication assistance.   Expected Discharge Date:                Expected Discharge Plan:  Home/Self Care  In-House Referral:     Discharge planning Services  CM Consult, Medication Assistance  Post Acute Care Choice:    Choice offered to:     DME Arranged:    DME Agency:     HH Arranged:    HH Agency:     Status of Service:  In process, will continue to follow  If discussed at Long Length of Stay Meetings, dates discussed:    Additional Comments:  Darrold Span, RN 09/30/2016, 2:40 PM

## 2016-09-30 NOTE — Progress Notes (Signed)
Progress note  Patient admitted earlier this morning, see H&P. Patient admitted with chief complaint of increasing shortness of breath since yesterday as well as productive cough. She is admitted for acute hypoxemic and hypercapnic respiratory failure due to COPD exacerbation. She states that since she has received treatment with steroids, nebulizer, antibiotics, she does feel better, although not at baseline yet. Continue present treatment. Plan to wean steroids tomorrow if improved  Noralee Stain, DO [facility] [email or web address] Password [email or web address] 09/30/2016, 12:09 PM

## 2016-09-30 NOTE — H&P (Signed)
History and Physical    Sally [name] AVW:[id number] DOB: [birth year] DOA: 09/29/2016  PCP: Dennis Bast, MD  Patient coming from: Home.  Chief Complaint: Shortness of breath.  HPI: Sally [name] is a 66 y.o. female with history of COPD, hypertension, hyperlipidemia and Crohn's disease in remission presents to the ER with complaints of increasing shortness of breath since yesterday morning. Patient also has been having productive cough. Denies any associated fever chills or chest pain. In the ER patient was found to be having continuous wheezing and was given steroids and nebulizer. Since patient continues to have persistent wheezing will be admitted for further management of COPD exacerbation. On my exam patient is mildly short of breath and is alert awake and oriented. Has bilateral expiratory wheeze.  ED Course: Chest x-ray was unremarkable EKG shows sinus tachycardia. Patient was given nebulizer and steroids. ABG done shows mild hypercapnia.  Review of Systems: As per HPI, rest all negative.   Past Medical History:  Diagnosis Date  . Asthma   . Back pain   . COPD (chronic obstructive pulmonary disease) (HCC)   . Crohn disease (HCC)   . Hypertension     Past Surgical History:  Procedure Laterality Date  . ABDOMINAL HYSTERECTOMY    . CATARACT EXTRACTION, BILATERAL    . FRACTURE SURGERY Left    ARM     reports that she has been smoking.  She has never used smokeless tobacco. She reports that she does not drink alcohol or use drugs.  No Known Allergies  Family History  Problem Relation Age of Onset  . Hypertension Other     Prior to Admission medications   Medication Sig Start Date End Date Taking? Authorizing Provider  albuterol (PROVENTIL) (2.5 MG/3ML) 0.083% nebulizer solution Take 2.5 mg by nebulization every 6 (six) hours as needed for wheezing or shortness of breath.  06/03/16   Historical Provider, MD  ANORO ELLIPTA 62.5-25 MCG/INH AEPB Inhale 1 puff into the lungs  daily.  06/30/16   Historical Provider, MD  aspirin EC 81 MG tablet Take 81 mg by mouth daily.    Historical Provider, MD  atorvastatin (LIPITOR) 20 MG tablet Take 20 mg by mouth at bedtime. 05/05/16   Historical Provider, MD  azithromycin (ZITHROMAX) 250 MG tablet Take 1 tablet (250 mg total) by mouth daily. 07/15/16   Shanker Levora Dredge, MD  DILT-XR 240 MG 24 hr capsule Take 240 mg by mouth daily. 06/30/16   Historical Provider, MD  FLOVENT HFA 110 MCG/ACT inhaler Inhale 2 puffs into the lungs every 12 (twelve) hours.  06/30/16   Historical Provider, MD  hydrOXYzine (VISTARIL) 25 MG capsule Take 25 mg by mouth 3 (three) times daily as needed for anxiety.  05/05/16   Historical Provider, MD  predniSONE (DELTASONE) 10 MG tablet Take 4 tablets (40 mg) daily for 2 days, then, Take 3 tablets (30 mg) daily for 2 days, then, Take 2 tablets (20 mg) daily for 2 days, then, Take 1 tablets (10 mg) daily for 1 days, then stop 07/15/16   Maretta Bees, MD    Physical Exam: Vitals:   09/30/16 0030 09/30/16 0055 09/30/16 0149 09/30/16 0204  BP: 162/75 155/81 (!) 161/73   Pulse: 86 87 91   Resp: 24 23 (!) 24   Temp:  98.7 F (37.1 C) 99.2 F (37.3 C)   TempSrc:  Oral Axillary   SpO2: 97% 97% 95% 97%  Weight:   75.1 kg (165 lb 9.6 oz)  Height:   5\' 1"  (1.549 m)       Constitutional: Moderately well nourished. Vitals:   09/30/16 0030 09/30/16 0055 09/30/16 0149 09/30/16 0204  BP: 162/75 155/81 (!) 161/73   Pulse: 86 87 91   Resp: 24 23 (!) 24   Temp:  98.7 F (37.1 C) 99.2 F (37.3 C)   TempSrc:  Oral Axillary   SpO2: 97% 97% 95% 97%  Weight:   75.1 kg (165 lb 9.6 oz)   Height:   5\' 1"  (1.549 m)    Eyes: Anicteric no pallor. ENMT: No discharge from the ears eyes nose and mouth. Neck: No JVD appreciated no mass felt. Respiratory: Bilateral expiratory wheeze and no crepitations. Cardiovascular: S1-S2 heard no murmurs appreciated. Abdomen: Soft nontender bowel sounds present. No guarding  or rigidity. Musculoskeletal: No edema. No joint effusion. Skin: No rash. Skin appears warm. Neurologic: Alert awake oriented to time place and person. Moves all extremities. Psychiatric: Appears normal. Normal affect.   Labs on Admission: I have personally reviewed following labs and imaging studies  CBC:  Recent Labs Lab 09/29/16 2157  WBC 10.7*  HGB 14.0  HCT 42.2  MCV 90.2  PLT 328   Basic Metabolic Panel:  Recent Labs Lab 09/29/16 2157  NA 137  K 3.7  CL 103  CO2 23  GLUCOSE 157*  BUN 10  CREATININE 0.73  CALCIUM 8.7*   GFR: Estimated Creatinine Clearance: 65.8 mL/min (by C-G formula based on SCr of 0.73 mg/dL). Liver Function Tests:  Recent Labs Lab 09/29/16 2157  AST 27  ALT 25  ALKPHOS 143*  BILITOT 0.3  PROT 7.8  ALBUMIN 4.1   No results for input(s): LIPASE, AMYLASE in the last 168 hours. No results for input(s): AMMONIA in the last 168 hours. Coagulation Profile: No results for input(s): INR, PROTIME in the last 168 hours. Cardiac Enzymes:  Recent Labs Lab 09/29/16 2157  TROPONINI <0.03   BNP (last 3 results) No results for input(s): PROBNP in the last 8760 hours. HbA1C: No results for input(s): HGBA1C in the last 72 hours. CBG: No results for input(s): GLUCAP in the last 168 hours. Lipid Profile: No results for input(s): CHOL, HDL, LDLCALC, TRIG, CHOLHDL, LDLDIRECT in the last 72 hours. Thyroid Function Tests: No results for input(s): TSH, T4TOTAL, FREET4, T3FREE, THYROIDAB in the last 72 hours. Anemia Panel: No results for input(s): VITAMINB12, FOLATE, FERRITIN, TIBC, IRON, RETICCTPCT in the last 72 hours. Urine analysis: No results found for: COLORURINE, APPEARANCEUR, LABSPEC, PHURINE, GLUCOSEU, HGBUR, BILIRUBINUR, KETONESUR, PROTEINUR, UROBILINOGEN, NITRITE, LEUKOCYTESUR Sepsis Labs: @LABRCNTIP (procalcitonin:4,lacticidven:4) )No results found for this or any previous visit (from the past 240 hour(s)).   Radiological Exams on  Admission: Dg Chest 2 View  Result Date: 09/29/2016 CLINICAL DATA:  Increased shortness of breath.  History of asthma. EXAM: CHEST  2 VIEW COMPARISON:  07/13/2016 FINDINGS: Cardiomediastinal silhouette is normal. Mediastinal contours appear intact. Tortuosity of the aorta. There is no evidence of focal airspace consolidation, pleural effusion or pneumothorax. Chronic interstitial lung changes and upper lobe predominant emphysema noted. Osseous structures are without acute abnormality. Soft tissues are grossly normal. IMPRESSION: No active cardiopulmonary disease. Chronic emphysematous changes of the lungs. Electronically Signed   By: Ted Mcalpine M.D.   On: 09/29/2016 22:38    EKG: Independently reviewed. Sinus tachycardia with nonspecific ST-T changes.  Assessment/Plan Principal Problem:   Acute respiratory failure with hypoxia and hypercapnia (HCC) Active Problems:   COPD exacerbation (HCC)   Essential hypertension  1. Acute respiratory failure with hypoxia and hypercapnia secondary to COPD exacerbation - patient appears short of breath but able to complete sentences. I will place patient on Solu-Medrol 40 mg every 12 hourly with doxycycline nebulizer treatment and Pulmicort. We'll closely monitor in telemetry. 2. Hypertension - blood pressure mildly elevated. Probably from #1. Continue diltiazem. 3. Hyperlipidemia on statins. 4. History of Crohn's in remission.  Note that patient requested regular diet.   DVT prophylaxis: Lovenox. Code Status: Full code.  Family Communication: Discussed with patient.  Disposition Plan: Home.  Consults called: None.  Admission status: Inpatient.    Eduard Clos MD [facility] Pager [phone].  If 7PM-7AM, please contact night-coverage [email or web address] Password [email or web address]  09/30/2016, 3:21 AM

## 2016-09-30 NOTE — Progress Notes (Signed)
Pt. States she takes tx. QID at home. Pt. Currently in no distress but has Exp. Wheezing. Pt. Will be given a tx. Pt. On 3L nasal cannula as well.

## 2016-10-01 DIAGNOSIS — J9601 Acute respiratory failure with hypoxia: Secondary | ICD-10-CM | POA: Diagnosis not present

## 2016-10-01 DIAGNOSIS — J9602 Acute respiratory failure with hypercapnia: Secondary | ICD-10-CM | POA: Diagnosis not present

## 2016-10-01 LAB — BLOOD GAS, ARTERIAL
Acid-Base Excess: 4.2 mmol/L — ABNORMAL HIGH (ref 0.0–2.0)
BICARBONATE: 30.5 mmol/L — AB (ref 20.0–28.0)
Drawn by: 275531
O2 CONTENT: 4 L/min
O2 Saturation: 96.5 %
PATIENT TEMPERATURE: 98.6
PCO2 ART: 67.1 mmHg — AB (ref 32.0–48.0)
PO2 ART: 91.8 mmHg (ref 83.0–108.0)
pH, Arterial: 7.28 — ABNORMAL LOW (ref 7.350–7.450)

## 2016-10-01 LAB — CBC WITH DIFFERENTIAL/PLATELET
BASOS PCT: 0 %
Basophils Absolute: 0 10*3/uL (ref 0.0–0.1)
EOS ABS: 0 10*3/uL (ref 0.0–0.7)
Eosinophils Relative: 0 %
HCT: 42.7 % (ref 36.0–46.0)
Hemoglobin: 13.9 g/dL (ref 12.0–15.0)
Lymphocytes Relative: 6 %
Lymphs Abs: 1.1 10*3/uL (ref 0.7–4.0)
MCH: 29.6 pg (ref 26.0–34.0)
MCHC: 32.6 g/dL (ref 30.0–36.0)
MCV: 90.9 fL (ref 78.0–100.0)
MONO ABS: 0.5 10*3/uL (ref 0.1–1.0)
MONOS PCT: 3 %
Neutro Abs: 16.7 10*3/uL — ABNORMAL HIGH (ref 1.7–7.7)
Neutrophils Relative %: 91 %
Platelets: 326 10*3/uL (ref 150–400)
RBC: 4.7 MIL/uL (ref 3.87–5.11)
RDW: 13.2 % (ref 11.5–15.5)
WBC: 18.3 10*3/uL — ABNORMAL HIGH (ref 4.0–10.5)

## 2016-10-01 LAB — BASIC METABOLIC PANEL
Anion gap: 10 (ref 5–15)
BUN: 15 mg/dL (ref 6–20)
CALCIUM: 9 mg/dL (ref 8.9–10.3)
CO2: 27 mmol/L (ref 22–32)
CREATININE: 0.71 mg/dL (ref 0.44–1.00)
Chloride: 103 mmol/L (ref 101–111)
GFR calc non Af Amer: >60 mL/min (ref >60)
Glucose, Bld: 165 mg/dL — ABNORMAL HIGH (ref 65–99)
Potassium: 4.7 mmol/L (ref 3.5–5.1)
SODIUM: 140 mmol/L (ref 135–145)

## 2016-10-01 MED ORDER — LORAZEPAM 2 MG/ML IJ SOLN
1.0000 mg | Freq: Four times a day (QID) | INTRAMUSCULAR | Status: DC | PRN
  Administered 2016-10-01 – 2016-10-04 (×5): 1 mg via INTRAVENOUS
  Filled 2016-10-01 (×4): qty 1

## 2016-10-01 MED ORDER — METHYLPREDNISOLONE SODIUM SUCC 40 MG IJ SOLR
40.0000 mg | Freq: Four times a day (QID) | INTRAMUSCULAR | Status: DC
  Administered 2016-10-01 – 2016-10-04 (×12): 40 mg via INTRAVENOUS
  Filled 2016-10-01 (×12): qty 1

## 2016-10-01 MED ORDER — LORAZEPAM 2 MG/ML IJ SOLN
INTRAMUSCULAR | Status: AC
  Filled 2016-10-01: qty 1

## 2016-10-01 MED ORDER — IBUPROFEN 800 MG PO TABS
800.0000 mg | ORAL_TABLET | Freq: Four times a day (QID) | ORAL | Status: DC | PRN
  Administered 2016-10-01: 800 mg via ORAL
  Filled 2016-10-01: qty 1

## 2016-10-01 NOTE — Progress Notes (Signed)
Patient transferred to 3W room 38.

## 2016-10-01 NOTE — Progress Notes (Signed)
PROGRESS NOTE    Sally [name]  ZOX:[id number] DOB: December 19, 1950 DOA: 09/29/2016 PCP: Dennis Bast, MD     Brief Narrative:  Sally [name] is a 66 y.o. female with history of COPD, hypertension, hyperlipidemia and Crohn's disease in remission presents to the ER with complaints of increasing shortness of breath, productive cough. She is admitted for acute respiratory failure secondary to COPD exacerbation.  Assessment & Plan:   Principal Problem:   Acute respiratory failure with hypoxia and hypercapnia (HCC) Active Problems:   COPD exacerbation (HCC)   Essential hypertension  Acute hypoxemic and hypercapnic respiratory failure secondary to COPD exacerbation -Solu-Medrol - increase dose today as patient worsening -Continue doxycycline -Nebs -Shortly after my evaluation this morning, the patient continued to deteriorate. ABG revealed hypercapnia. Patient transferred to stepdown unit for BiPAP management. Confirmed with patient that she is a FULL CODE STATUS.   Essential hypertension -Continue diltiazem  Hyperlipidemia -Continue lipitor   Leukocytosis -Likely secondary to steroid use, not sepsis. Trend CBC   DVT prophylaxis: lovenox Code Status: FULL, confirmed today prior to BIPAP Family Communication: Patient specifically asked me not to update husband  Disposition Plan: Transfer to stepdown today  Consultants:   None  Procedures:   None  Antimicrobials:   Doxycycline 1/4 >>>    Subjective: Patient states that she is not doing any better. She continues to complain of generalized malaise, shortness of breath, productive cough. She is very anxious this morning.    Objective: Vitals:   10/01/16 1119 10/01/16 1204 10/01/16 1431 10/01/16 1435  BP:  (!) 167/55  (!) 157/62  Pulse:  79 75   Resp:  16 18   Temp:  98.5 F (36.9 C)  97.6 F (36.4 C)  TempSrc:  Oral  Axillary  SpO2: 95% 98% 98% 100%  Weight:      Height:  5\' 1"  (1.549 m)      Intake/Output  Summary (Last 24 hours) at 10/01/16 1446 Last data filed at 10/01/16 0900  Gross per 24 hour  Intake              600 ml  Output             1000 ml  Net             -400 ml   Filed Weights   09/29/16 2151 09/30/16 0149 10/01/16 0503  Weight: 73.9 kg (163 lb) 75.1 kg (165 lb 9.6 oz) 76.3 kg (168 lb 4.8 oz)    Examination:  General exam: Appears anxious, labored breathing  Respiratory system: Diminished breath sounds diffusely, minimal wheezing, no conversational dyspnea but does appear uncomfortable with respiration  Cardiovascular system: S1 & S2 heard, RRR. No JVD, murmurs, rubs, gallops or clicks. No pedal edema. Gastrointestinal system: Abdomen is nondistended, soft and nontender. No organomegaly or masses felt. Normal bowel sounds heard. Central nervous system: Alert and oriented. No focal neurological deficits. Extremities: Symmetric 5 x 5 power. Skin: No rashes, lesions or ulcers Psychiatry: Judgement and insight appear normal. Mood & affect appropriate.   Data Reviewed: I have personally reviewed following labs and imaging studies  CBC:  Recent Labs Lab 09/29/16 2157 09/30/16 0344 10/01/16 0236  WBC 10.7* 12.5* 18.3*  NEUTROABS  --  12.0* 16.7*  HGB 14.0 13.6 13.9  HCT 42.2 39.6 42.7  MCV 90.2 89.0 90.9  PLT 328 314 326   Basic Metabolic Panel:  Recent Labs Lab 09/29/16 2157 09/30/16 0344 10/01/16 0236  NA 137 140 140  K 3.7  3.9 4.7  CL 103 103 103  CO2 23 26 27   GLUCOSE 157* 201* 165*  BUN 10 9 15   CREATININE 0.73 0.83 0.71  CALCIUM 8.7* 8.9 9.0   GFR: Estimated Creatinine Clearance: 66.4 mL/min (by C-G formula based on SCr of 0.71 mg/dL). Liver Function Tests:  Recent Labs Lab 09/29/16 2157 09/30/16 0344  AST 27 20  ALT 25 24  ALKPHOS 143* 133*  BILITOT 0.3 0.2*  PROT 7.8 7.3  ALBUMIN 4.1 3.6   No results for input(s): LIPASE, AMYLASE in the last 168 hours. No results for input(s): AMMONIA in the last 168 hours. Coagulation Profile: No  results for input(s): INR, PROTIME in the last 168 hours. Cardiac Enzymes:  Recent Labs Lab 09/29/16 2157 09/30/16 0344  TROPONINI <0.03 <0.03   BNP (last 3 results) No results for input(s): PROBNP in the last 8760 hours. HbA1C: No results for input(s): HGBA1C in the last 72 hours. CBG: No results for input(s): GLUCAP in the last 168 hours. Lipid Profile: No results for input(s): CHOL, HDL, LDLCALC, TRIG, CHOLHDL, LDLDIRECT in the last 72 hours. Thyroid Function Tests: No results for input(s): TSH, T4TOTAL, FREET4, T3FREE, THYROIDAB in the last 72 hours. Anemia Panel: No results for input(s): VITAMINB12, FOLATE, FERRITIN, TIBC, IRON, RETICCTPCT in the last 72 hours. Sepsis Labs: No results for input(s): PROCALCITON, LATICACIDVEN in the last 168 hours.  No results found for this or any previous visit (from the past 240 hour(s)).     Radiology Studies: Dg Chest 2 View  Result Date: 09/29/2016 CLINICAL DATA:  Increased shortness of breath.  History of asthma. EXAM: CHEST  2 VIEW COMPARISON:  07/13/2016 FINDINGS: Cardiomediastinal silhouette is normal. Mediastinal contours appear intact. Tortuosity of the aorta. There is no evidence of focal airspace consolidation, pleural effusion or pneumothorax. Chronic interstitial lung changes and upper lobe predominant emphysema noted. Osseous structures are without acute abnormality. Soft tissues are grossly normal. IMPRESSION: No active cardiopulmonary disease. Chronic emphysematous changes of the lungs. Electronically Signed   By: Ted Mcalpine M.D.   On: 09/29/2016 22:38      Scheduled Meds: . aspirin EC  81 mg Oral Daily  . atorvastatin  20 mg Oral QHS  . budesonide (PULMICORT) nebulizer solution  0.25 mg Nebulization BID  . diltiazem  240 mg Oral Daily  . doxycycline  100 mg Oral Q12H  . DULoxetine  30 mg Oral Daily  . enoxaparin (LOVENOX) injection  40 mg Subcutaneous Q24H  . ipratropium-albuterol  3 mL Nebulization QID  .  LORazepam      . methylPREDNISolone (SOLU-MEDROL) injection  40 mg Intravenous Q6H   Continuous Infusions:   LOS: 2 days    Time spent: 40 minutes   Noralee Stain, DO [facility] [email or web address] Password [email or web address] 10/01/2016, 2:46 PM

## 2016-10-01 NOTE — Progress Notes (Signed)
Patient noted to have no improvement in lung sounds with all available neb treatments and ordered IV steroids.  O2 sat before and after breathing treatment was 85% on 3 L [place].  MD was called.  Rapid Response was called.

## 2016-10-01 NOTE — Significant Event (Signed)
Rapid Response Event Note  Overview:  Called by RN to evaluate patient Time Called: 1129 Arrival Time: 1135 Event Type: Respiratory  Initial Focused Assessment:  Called by RN to evaluate patient due to increased SOB and wheezing.  As per RN Patient has been receiving breathing treatments this am which is not giving patient long term relief.  On my arrival to patients bedside, patient sitting on the side of the bed, trying to eat her breakfast.  Can not speak in complete sentences. 167/55, 79, 97%, 3 l [place], RR 22.  Appears anxious. Breath Sounds diminished with inspiratory and expiratory wheezes bilaterally.  Patient expresses frustration due to Her breathing status not improving.     Interventions:  MD paged to evaluate.  Spoke with MD, ABG ordered.  Results given to MD Patient started on BIPAP.  RT sat with patient while on 2 W  Plan of Care (if not transferred):  Patient to transfer to SDU when available  Event Summary: RN or RT to call if assistance needed   at      at          [facility], [place]

## 2016-10-02 DIAGNOSIS — J9602 Acute respiratory failure with hypercapnia: Secondary | ICD-10-CM | POA: Diagnosis not present

## 2016-10-02 DIAGNOSIS — J9601 Acute respiratory failure with hypoxia: Secondary | ICD-10-CM | POA: Diagnosis not present

## 2016-10-02 LAB — BLOOD GAS, ARTERIAL
Acid-Base Excess: 5.6 mmol/L — ABNORMAL HIGH (ref 0.0–2.0)
BICARBONATE: 31.4 mmol/L — AB (ref 20.0–28.0)
DRAWN BY: 406621
FIO2: 40
Mode: POSITIVE
O2 Saturation: 99 %
PATIENT TEMPERATURE: 98.6
PCO2 ART: 62.5 mmHg — AB (ref 32.0–48.0)
PEEP/CPAP: 6 cmH2O
PO2 ART: 148 mmHg — AB (ref 83.0–108.0)
PRESSURE CONTROL: 8 cmH2O
RATE: 12 resp/min
pH, Arterial: 7.321 — ABNORMAL LOW (ref 7.350–7.450)

## 2016-10-02 LAB — BASIC METABOLIC PANEL
ANION GAP: 8 (ref 5–15)
BUN: 32 mg/dL — ABNORMAL HIGH (ref 6–20)
CHLORIDE: 103 mmol/L (ref 101–111)
CO2: 30 mmol/L (ref 22–32)
CREATININE: 0.97 mg/dL (ref 0.44–1.00)
Calcium: 9 mg/dL (ref 8.9–10.3)
GFR calc non Af Amer: >60 mL/min (ref >60)
Glucose, Bld: 166 mg/dL — ABNORMAL HIGH (ref 65–99)
POTASSIUM: 4.4 mmol/L (ref 3.5–5.1)
Sodium: 141 mmol/L (ref 135–145)

## 2016-10-02 LAB — CBC WITH DIFFERENTIAL/PLATELET
Basophils Absolute: 0 10*3/uL (ref 0.0–0.1)
Basophils Relative: 0 %
Eosinophils Absolute: 0 10*3/uL (ref 0.0–0.7)
Eosinophils Relative: 0 %
HEMATOCRIT: 40.5 % (ref 36.0–46.0)
HEMOGLOBIN: 12.8 g/dL (ref 12.0–15.0)
Lymphocytes Relative: 10 %
Lymphs Abs: 1.2 10*3/uL (ref 0.7–4.0)
MCH: 29.6 pg (ref 26.0–34.0)
MCHC: 31.6 g/dL (ref 30.0–36.0)
MCV: 93.8 fL (ref 78.0–100.0)
MONOS PCT: 3 %
Monocytes Absolute: 0.3 10*3/uL (ref 0.1–1.0)
NEUTROS ABS: 10 10*3/uL — AB (ref 1.7–7.7)
NEUTROS PCT: 87 %
Platelets: 316 10*3/uL (ref 150–400)
RBC: 4.32 MIL/uL (ref 3.87–5.11)
RDW: 13.7 % (ref 11.5–15.5)
WBC: 11.4 10*3/uL — AB (ref 4.0–10.5)

## 2016-10-02 MED ORDER — KETOROLAC TROMETHAMINE 30 MG/ML IJ SOLN
15.0000 mg | Freq: Four times a day (QID) | INTRAMUSCULAR | Status: DC | PRN
  Administered 2016-10-02 – 2016-10-06 (×8): 15 mg via INTRAVENOUS
  Filled 2016-10-02 (×8): qty 1

## 2016-10-02 NOTE — Progress Notes (Signed)
Pt taken off bipap so she could eat her dinner. Placed on 5L HFNC. RN to call if pt wob increases. RT will closely monitor.

## 2016-10-02 NOTE — Progress Notes (Signed)
PROGRESS NOTE    Sally [name]  [id number] DOB: February 26, 1951 DOA: 09/29/2016 PCP: Dennis Bast, MD     Brief Narrative:  Sally [name] is a 66 y.o. female with history of COPD, hypertension, hyperlipidemia and Crohn's disease in remission presents to the ER with complaints of increasing shortness of breath, productive cough. She is admitted for acute respiratory failure secondary to COPD exacerbation.  Assessment & Plan:   Principal Problem:   Acute respiratory failure with hypoxia and hypercapnia (HCC) Active Problems:   COPD exacerbation (HCC)   Essential hypertension  Acute hypoxemic and hypercapnic respiratory failure secondary to COPD exacerbation -Continue Solu-Medrol, plan to wean to PO once off bipap  -Continue doxycycline -Nebs -Transferred to stepdown yesterday for BiPAP. Improved in appearance. Remain on BiPAP today, hopefully able to wean   Essential hypertension -Continue diltiazem  Hyperlipidemia -Continue lipitor   Leukocytosis -Likely secondary to steroid use, not sepsis. Trend CBC   DVT prophylaxis: lovenox  Code Status: FULL Family Communication: Patient has specifically asked me not to update husband  Disposition Plan: pending further improvement   Consultants:   None  Procedures:   None  Antimicrobials:   Doxycycline 1/4 >>>    Subjective: Patient doing much better on BiPAP this morning. She complains of hunger and headache. She denies any chest pain or shortness of breath. Continues to have mild coughing.   Objective: Vitals:   10/01/16 2338 10/02/16 0414 10/02/16 0751 10/02/16 0814  BP: (!) 155/70 134/71    Pulse: 85 86    Resp: 17 18    Temp: 97.6 F (36.4 C) 97.9 F (36.6 C)    TempSrc: Axillary Axillary    SpO2: 99% 99% 100% 99%  Weight:  76.7 kg (169 lb)    Height:       No intake or output data in the 24 hours ending 10/02/16 1104 Filed Weights   09/30/16 0149 10/01/16 0503 10/02/16 0414  Weight: 75.1 kg (165 lb 9.6  oz) 76.3 kg (168 lb 4.8 oz) 76.7 kg (169 lb)    Examination:  General exam: Appears comfortable, calm  Respiratory system: Diminished breath sounds diffusely, on BiPAP  Cardiovascular system: S1 & S2 heard, RRR. No JVD, murmurs, rubs, gallops or clicks. No pedal edema. Gastrointestinal system: Abdomen is nondistended, soft and nontender. No organomegaly or masses felt. Normal bowel sounds heard. Central nervous system: Alert and oriented. No focal neurological deficits. Extremities: Symmetric 5 x 5 power. Skin: No rashes, lesions or ulcers Psychiatry: Judgement and insight appear normal. Mood & affect appropriate.   Data Reviewed: I have personally reviewed following labs and imaging studies  CBC:  Recent Labs Lab 09/29/16 2157 09/30/16 0344 10/01/16 0236 10/02/16 0430  WBC 10.7* 12.5* 18.3* 11.4*  NEUTROABS  --  12.0* 16.7* 10.0*  HGB 14.0 13.6 13.9 12.8  HCT 42.2 39.6 42.7 40.5  MCV 90.2 89.0 90.9 93.8  PLT 328 314 326 316   Basic Metabolic Panel:  Recent Labs Lab 09/29/16 2157 09/30/16 0344 10/01/16 0236 10/02/16 0430  NA 137 140 140 141  K 3.7 3.9 4.7 4.4  CL 103 103 103 103  CO2 23 26 27 30   GLUCOSE 157* 201* 165* 166*  BUN 10 9 15  32*  CREATININE 0.73 0.83 0.71 0.97  CALCIUM 8.7* 8.9 9.0 9.0   GFR: Estimated Creatinine Clearance: 54.9 mL/min (by C-G formula based on SCr of 0.97 mg/dL). Liver Function Tests:  Recent Labs Lab 09/29/16 2157 09/30/16 0344  AST 27 20  ALT 25  24  ALKPHOS 143* 133*  BILITOT 0.3 0.2*  PROT 7.8 7.3  ALBUMIN 4.1 3.6   No results for input(s): LIPASE, AMYLASE in the last 168 hours. No results for input(s): AMMONIA in the last 168 hours. Coagulation Profile: No results for input(s): INR, PROTIME in the last 168 hours. Cardiac Enzymes:  Recent Labs Lab 09/29/16 2157 09/30/16 0344  TROPONINI <0.03 <0.03   BNP (last 3 results) No results for input(s): PROBNP in the last 8760 hours. HbA1C: No results for input(s):  HGBA1C in the last 72 hours. CBG: No results for input(s): GLUCAP in the last 168 hours. Lipid Profile: No results for input(s): CHOL, HDL, LDLCALC, TRIG, CHOLHDL, LDLDIRECT in the last 72 hours. Thyroid Function Tests: No results for input(s): TSH, T4TOTAL, FREET4, T3FREE, THYROIDAB in the last 72 hours. Anemia Panel: No results for input(s): VITAMINB12, FOLATE, FERRITIN, TIBC, IRON, RETICCTPCT in the last 72 hours. Sepsis Labs: No results for input(s): PROCALCITON, LATICACIDVEN in the last 168 hours.  No results found for this or any previous visit (from the past 240 hour(s)).     Radiology Studies: No results found.    Scheduled Meds: . aspirin EC  81 mg Oral Daily  . atorvastatin  20 mg Oral QHS  . budesonide (PULMICORT) nebulizer solution  0.25 mg Nebulization BID  . diltiazem  240 mg Oral Daily  . doxycycline  100 mg Oral Q12H  . DULoxetine  30 mg Oral Daily  . enoxaparin (LOVENOX) injection  40 mg Subcutaneous Q24H  . ipratropium-albuterol  3 mL Nebulization QID  . methylPREDNISolone (SOLU-MEDROL) injection  40 mg Intravenous Q6H   Continuous Infusions:   LOS: 3 days    Time spent: 30 minutes   Noralee Stain, DO [facility] [email or web address] Password [email or web address] 10/02/2016, 11:04 AM

## 2016-10-02 NOTE — Progress Notes (Signed)
Patient placed on BiPAP for the night without complication. Tolerating it well at this time. Patient placed on due to increased work of breathing and shortness of breath on 6L nasal cannula.

## 2016-10-02 NOTE — Progress Notes (Signed)
Rt found pt's vent/bipap alarming this am due to high leak (>100). RT explained to RN when leak is high, it causes bipap to shut down until corrected and mask is adjusted on pt. When leak is high, pt re-breathes CO2, causing her to remain on bipap longer. Rapid Response RN made aware of situation. RT will continue to closely monitor pt

## 2016-10-02 NOTE — Progress Notes (Signed)
Pt taken off bipap and placed on 6L HFNC. Pt more awake at this time and in no distress. RN at bedside. RT will continue to monitor need for bipap again.

## 2016-10-03 DIAGNOSIS — J9602 Acute respiratory failure with hypercapnia: Secondary | ICD-10-CM | POA: Diagnosis not present

## 2016-10-03 DIAGNOSIS — J9601 Acute respiratory failure with hypoxia: Secondary | ICD-10-CM | POA: Diagnosis not present

## 2016-10-03 MED ORDER — HYDRALAZINE HCL 20 MG/ML IJ SOLN: 5.0000 mg | INTRAMUSCULAR | Status: DC | PRN

## 2016-10-03 NOTE — Plan of Care (Signed)
Problem: Safety: Goal: Ability to remain free from injury will improve Outcome: Progressing Patient moved closer to nurses desk, increased assessments, pt educated to use call light or phone for assistance, call light phone, tray and personal belongings within reach.  Problem: Physical Regulation: Goal: Ability to maintain clinical measurements within normal limits will improve Outcome: Progressing Bi-pap removed placed on high flow 02 BNC, sats remaining in high 90s

## 2016-10-03 NOTE — Progress Notes (Signed)
PROGRESS NOTE    Sally [name]  WUJ:[id number] DOB: [birth year] DOA: 09/29/2016 PCP: Dennis Bast, MD     Brief Narrative:  Sally [name] is a 66 y.o. female with history of COPD, hypertension, hyperlipidemia and Crohn's disease in remission presents to the ER with complaints of increasing shortness of breath, productive cough. She is admitted for acute respiratory failure secondary to COPD exacerbation.  Assessment & Plan:   Principal Problem:   Acute respiratory failure with hypoxia and hypercapnia (HCC) Active Problems:   COPD exacerbation (HCC)   Essential hypertension  Acute hypoxemic and hypercapnic respiratory failure secondary to COPD exacerbation -Continue Solu-Medrol, plan to wean to PO once off bipap  -Continue doxycycline -Nebs -Transferred to stepdown for BiPAP. Improved in appearance. Remain on BiPAP today, hopefully able to wean. ABG tomorrow AM   Essential hypertension -Continue diltiazem  Hyperlipidemia -Continue lipitor   Leukocytosis -Likely secondary to steroid use, not sepsis. Trend CBC   DVT prophylaxis: lovenox  Code Status: FULL Family Communication: Patient has specifically asked me not to update husband  Disposition Plan: pending further improvement   Consultants:   None  Procedures:   None  Antimicrobials:   Doxycycline 1/4 >>>    Subjective: Patient currently off BiPAP this morning. Denies CP or SOB. States she is starving but no other acute events. Suspect she will require BiPAP another day    Objective: Vitals:   10/03/16 0436 10/03/16 0603 10/03/16 0737 10/03/16 0905  BP: (!) 176/84 (!) 156/75    Pulse: (!) 113  90   Resp: (!) 22  18   Temp:   98.1 F (36.7 C)   TempSrc:   Oral   SpO2: 97% 98% 100% 99%  Weight: 76.2 kg (168 lb)     Height:        Intake/Output Summary (Last 24 hours) at 10/03/16 1126 Last data filed at 10/03/16 0700  Gross per 24 hour  Intake              650 ml  Output             1000 ml  Net              -350 ml   Filed Weights   10/01/16 0503 10/02/16 0414 10/03/16 0436  Weight: 76.3 kg (168 lb 4.8 oz) 76.7 kg (169 lb) 76.2 kg (168 lb)    Examination:  General exam: Appears comfortable, calm  Respiratory system: Diminished breath sounds diffusely, on HFNC  Cardiovascular system: S1 & S2 heard, RRR. No JVD, murmurs, rubs, gallops or clicks. No pedal edema. Gastrointestinal system: Abdomen is nondistended, soft and nontender. No organomegaly or masses felt. Normal bowel sounds heard. Central nervous system: Alert and oriented. No focal neurological deficits. Extremities: Symmetric 5 x 5 power. Skin: No rashes, lesions or ulcers Psychiatry: Judgement and insight appear normal. Mood & affect appropriate.   Data Reviewed: I have personally reviewed following labs and imaging studies  CBC:  Recent Labs Lab 09/29/16 2157 09/30/16 0344 10/01/16 0236 10/02/16 0430  WBC 10.7* 12.5* 18.3* 11.4*  NEUTROABS  --  12.0* 16.7* 10.0*  HGB 14.0 13.6 13.9 12.8  HCT 42.2 39.6 42.7 40.5  MCV 90.2 89.0 90.9 93.8  PLT 328 314 326 316   Basic Metabolic Panel:  Recent Labs Lab 09/29/16 2157 09/30/16 0344 10/01/16 0236 10/02/16 0430  NA 137 140 140 141  K 3.7 3.9 4.7 4.4  CL 103 103 103 103  CO2 23 26 27  30  GLUCOSE 157* 201* 165* 166*  BUN 10 9 15  32*  CREATININE 0.73 0.83 0.71 0.97  CALCIUM 8.7* 8.9 9.0 9.0   GFR: Estimated Creatinine Clearance: 54.8 mL/min (by C-G formula based on SCr of 0.97 mg/dL). Liver Function Tests:  Recent Labs Lab 09/29/16 2157 09/30/16 0344  AST 27 20  ALT 25 24  ALKPHOS 143* 133*  BILITOT 0.3 0.2*  PROT 7.8 7.3  ALBUMIN 4.1 3.6   No results for input(s): LIPASE, AMYLASE in the last 168 hours. No results for input(s): AMMONIA in the last 168 hours. Coagulation Profile: No results for input(s): INR, PROTIME in the last 168 hours. Cardiac Enzymes:  Recent Labs Lab 09/29/16 2157 09/30/16 0344  TROPONINI <0.03 <0.03   BNP (last 3  results) No results for input(s): PROBNP in the last 8760 hours. HbA1C: No results for input(s): HGBA1C in the last 72 hours. CBG: No results for input(s): GLUCAP in the last 168 hours. Lipid Profile: No results for input(s): CHOL, HDL, LDLCALC, TRIG, CHOLHDL, LDLDIRECT in the last 72 hours. Thyroid Function Tests: No results for input(s): TSH, T4TOTAL, FREET4, T3FREE, THYROIDAB in the last 72 hours. Anemia Panel: No results for input(s): VITAMINB12, FOLATE, FERRITIN, TIBC, IRON, RETICCTPCT in the last 72 hours. Sepsis Labs: No results for input(s): PROCALCITON, LATICACIDVEN in the last 168 hours.  No results found for this or any previous visit (from the past 240 hour(s)).     Radiology Studies: No results found.    Scheduled Meds: . aspirin EC  81 mg Oral Daily  . atorvastatin  20 mg Oral QHS  . budesonide (PULMICORT) nebulizer solution  0.25 mg Nebulization BID  . diltiazem  240 mg Oral Daily  . doxycycline  100 mg Oral Q12H  . DULoxetine  30 mg Oral Daily  . enoxaparin (LOVENOX) injection  40 mg Subcutaneous Q24H  . ipratropium-albuterol  3 mL Nebulization QID  . methylPREDNISolone (SOLU-MEDROL) injection  40 mg Intravenous Q6H   Continuous Infusions:   LOS: 4 days    Time spent: 30 minutes   Noralee Stain, DO [facility] [email or web address] Password [email or web address] 10/03/2016, 11:26 AM

## 2016-10-03 NOTE — Progress Notes (Signed)
Patient transported from 3W38 to 3W26 without complication.

## 2016-10-03 NOTE — Progress Notes (Signed)
Pt found by nursing staff to have bipap mask off with saturations in th 70s.  Pt's bipap reapplied with sats 99-100%.  This is a recurrent event.  Pt moved closer to nurse's station into surveilence room for closer observation.  Pt notified and gave consent to move.  RT assisted with pt transfer.  Will continue to monitor closely.

## 2016-10-03 NOTE — Progress Notes (Signed)
Patient's recent SBP's have been >160. Takes Cardizem 240 mg PO daily. Paged NP Craige Cotta. Ordered Hydralazine IV 5 mg if SBP > 170. Will continue to monitor closely.

## 2016-10-04 DIAGNOSIS — J9601 Acute respiratory failure with hypoxia: Secondary | ICD-10-CM | POA: Diagnosis not present

## 2016-10-04 DIAGNOSIS — J9602 Acute respiratory failure with hypercapnia: Secondary | ICD-10-CM | POA: Diagnosis not present

## 2016-10-04 LAB — BLOOD GAS, ARTERIAL
Acid-Base Excess: 8.9 mmol/L — ABNORMAL HIGH (ref 0.0–2.0)
Bicarbonate: 34.7 mmol/L — ABNORMAL HIGH (ref 20.0–28.0)
DRAWN BY: 365271
O2 CONTENT: 2 L/min
O2 SAT: 96.7 %
PATIENT TEMPERATURE: 98.6
pCO2 arterial: 65.7 mmHg (ref 32.0–48.0)
pH, Arterial: 7.342 — ABNORMAL LOW (ref 7.350–7.450)
pO2, Arterial: 92.9 mmHg (ref 83.0–108.0)

## 2016-10-04 MED ORDER — PREDNISONE 10 MG PO TABS
50.0000 mg | ORAL_TABLET | Freq: Every day | ORAL | Status: DC
  Administered 2016-10-05 – 2016-10-06 (×2): 50 mg via ORAL
  Filled 2016-10-04 (×2): qty 2

## 2016-10-04 NOTE — Progress Notes (Addendum)
PROGRESS NOTE    Sally [name]  FIE:[id number] DOB: May 22, 1951 DOA: 09/29/2016 PCP: Dennis Bast, MD     Brief Narrative:  Sally [name] is a 66 y.o. female with history of COPD, hypertension, hyperlipidemia and Crohn's disease in remission presents to the ER with complaints of increasing shortness of breath, productive cough. She is admitted for acute respiratory failure secondary to COPD exacerbation. Due to worsening respiratory status, she was transferred to step down unit for BiPAP.   Assessment & Plan:   Principal Problem:   Acute respiratory failure with hypoxia and hypercapnia (HCC) Active Problems:   COPD exacerbation (HCC)   Essential hypertension  Acute hypoxemic and hypercapnic respiratory failure secondary to COPD exacerbation -Continue doxycycline, Nebs -Wean steroids to PO today  -Transferred to stepdown for BiPAP. Improving slowly, has been off BiPAP just over 24 hours   Essential hypertension -Continue diltiazem  Hyperlipidemia -Continue lipitor    DVT prophylaxis: lovenox  Code Status: FULL Family Communication: Patient has specifically asked me not to update husband  Disposition Plan: pending further improvement   Consultants:   None  Procedures:   None  Antimicrobials:   Doxycycline 1/4 >>>    Subjective: Patient currently off BiPAP this morning. Denies CP or SOB. States that she is starting to feel better    Objective: Vitals:   10/04/16 0745 10/04/16 0855 10/04/16 0858 10/04/16 1100  BP: (!) 155/60   (!) 141/58  Pulse: 64   71  Resp: 18     Temp: 97.3 F (36.3 C)   98.2 F (36.8 C)  TempSrc: Oral   Oral  SpO2: 99% 98% 98% 97%  Weight:      Height:        Intake/Output Summary (Last 24 hours) at 10/04/16 1133 Last data filed at 10/04/16 0900  Gross per 24 hour  Intake              900 ml  Output                0 ml  Net              900 ml   Filed Weights   10/02/16 0414 10/03/16 0436 10/04/16 0349  Weight: 76.7 kg (169  lb) 76.2 kg (168 lb) 75.3 kg (166 lb)    Examination:  General exam: Appears comfortable, calm  Respiratory system: Diminished breath sounds diffusely, on [place] O2  Cardiovascular system: S1 & S2 heard, RRR. No JVD, murmurs, rubs, gallops or clicks. No pedal edema. Gastrointestinal system: Abdomen is nondistended, soft and nontender. No organomegaly or masses felt. Normal bowel sounds heard. Central nervous system: Alert and oriented. No focal neurological deficits. Extremities: Symmetric 5 x 5 power. Skin: No rashes, lesions or ulcers Psychiatry: Judgement and insight appear normal. Mood & affect appropriate.   Data Reviewed: I have personally reviewed following labs and imaging studies  CBC:  Recent Labs Lab 09/29/16 2157 09/30/16 0344 10/01/16 0236 10/02/16 0430  WBC 10.7* 12.5* 18.3* 11.4*  NEUTROABS  --  12.0* 16.7* 10.0*  HGB 14.0 13.6 13.9 12.8  HCT 42.2 39.6 42.7 40.5  MCV 90.2 89.0 90.9 93.8  PLT 328 314 326 316   Basic Metabolic Panel:  Recent Labs Lab 09/29/16 2157 09/30/16 0344 10/01/16 0236 10/02/16 0430  NA 137 140 140 141  K 3.7 3.9 4.7 4.4  CL 103 103 103 103  CO2 23 26 27 30   GLUCOSE 157* 201* 165* 166*  BUN 10 9 15  32*  CREATININE 0.73 0.83 0.71 0.97  CALCIUM 8.7* 8.9 9.0 9.0   GFR: Estimated Creatinine Clearance: 54.4 mL/min (by C-G formula based on SCr of 0.97 mg/dL). Liver Function Tests:  Recent Labs Lab 09/29/16 2157 09/30/16 0344  AST 27 20  ALT 25 24  ALKPHOS 143* 133*  BILITOT 0.3 0.2*  PROT 7.8 7.3  ALBUMIN 4.1 3.6   No results for input(s): LIPASE, AMYLASE in the last 168 hours. No results for input(s): AMMONIA in the last 168 hours. Coagulation Profile: No results for input(s): INR, PROTIME in the last 168 hours. Cardiac Enzymes:  Recent Labs Lab 09/29/16 2157 09/30/16 0344  TROPONINI <0.03 <0.03   BNP (last 3 results) No results for input(s): PROBNP in the last 8760 hours. HbA1C: No results for input(s): HGBA1C in  the last 72 hours. CBG: No results for input(s): GLUCAP in the last 168 hours. Lipid Profile: No results for input(s): CHOL, HDL, LDLCALC, TRIG, CHOLHDL, LDLDIRECT in the last 72 hours. Thyroid Function Tests: No results for input(s): TSH, T4TOTAL, FREET4, T3FREE, THYROIDAB in the last 72 hours. Anemia Panel: No results for input(s): VITAMINB12, FOLATE, FERRITIN, TIBC, IRON, RETICCTPCT in the last 72 hours. Sepsis Labs: No results for input(s): PROCALCITON, LATICACIDVEN in the last 168 hours.  No results found for this or any previous visit (from the past 240 hour(s)).     Radiology Studies: No results found.    Scheduled Meds: . aspirin EC  81 mg Oral Daily  . atorvastatin  20 mg Oral QHS  . budesonide (PULMICORT) nebulizer solution  0.25 mg Nebulization BID  . diltiazem  240 mg Oral Daily  . doxycycline  100 mg Oral Q12H  . DULoxetine  30 mg Oral Daily  . enoxaparin (LOVENOX) injection  40 mg Subcutaneous Q24H  . ipratropium-albuterol  3 mL Nebulization QID  . methylPREDNISolone (SOLU-MEDROL) injection  40 mg Intravenous Q6H   Continuous Infusions:   LOS: 5 days    Time spent: 30 minutes   Noralee Stain, DO [facility] [email or web address] Password [email or web address] 10/04/2016, 11:33 AM

## 2016-10-04 NOTE — Progress Notes (Signed)
Critical ABG result called to RN.

## 2016-10-04 NOTE — Evaluation (Signed)
Physical Therapy Evaluation Patient Details Name: Sally [name] MRN: [id number] DOB: [birth year] Today's Date: 10/04/2016   History of Present Illness  Sally [name] is a 66 y.o. female with history of COPD, hypertension, hyperlipidemia and Crohn's disease in remission presents to the ER with complaints of increasing shortness of breath, productive cough. She is admitted for acute respiratory failure secondary to COPD exacerbation  Clinical Impression  Pt presents with decreased strength, activity tolerance and mobility and will benefit from skilled PT services to address deficits and progress towards prior level of function.  Pt on 3LO2 throughout session with spO2 90% with mobility.    Follow Up Recommendations Home health PT    Equipment Recommendations   (TBD based on progress)    Recommendations for Other Services       Precautions / Restrictions Precautions Precautions: Fall Restrictions Weight Bearing Restrictions: No      Mobility  Bed Mobility Overal bed mobility: Modified Independent             General bed mobility comments: increased time with HOB raised  Transfers Overall transfer level: Needs assistance Equipment used: Rolling walker (2 wheeled) Transfers: Sit to/from Stand Sit to Stand: Min guard         General transfer comment: multiple sit <> stand from bed with pt able to tolerate standing x 2.5 minutes at most, steadying assist for balance when standing  Ambulation/Gait Ambulation/Gait assistance: Min guard Ambulation Distance (Feet): 5 Feet Assistive device: Rolling walker (2 wheeled)       General Gait Details: pt wtih decreased cadence, step and stride length, cues for breathing during gait.  Stairs            Wheelchair Mobility    Modified Rankin (Stroke Patients Only)       Balance Overall balance assessment: Needs assistance           Standing balance-Leahy Scale: Fair Standing balance comment: requires RW for  standing balance                             Pertinent Vitals/Pain Pain Assessment: 0-10 Pain Score: 4  Pain Location: back, head Pain Intervention(s): Limited activity within patient's tolerance;Monitored during session;Repositioned    Home Living Family/patient expects to be discharged to:: Private residence Living Arrangements: Spouse/significant other Available Help at Discharge: Family Type of Home: House Home Access: Stairs to enter Entrance Stairs-Rails: None Secretary/administrator of Steps: 5 Home Layout: One level        Prior Function Level of Independence: Independent               Hand Dominance        Extremity/Trunk Assessment   Upper Extremity Assessment Upper Extremity Assessment: Generalized weakness    Lower Extremity Assessment Lower Extremity Assessment: Generalized weakness    Cervical / Trunk Assessment Cervical / Trunk Assessment: Kyphotic  Communication   Communication: No difficulties  Cognition Arousal/Alertness: Awake/alert Behavior During Therapy: WFL for tasks assessed/performed Overall Cognitive Status: Within Functional Limits for tasks assessed                      General Comments      Exercises     Assessment/Plan    PT Assessment Patient needs continued PT services  PT Problem List Decreased strength;Decreased mobility;Decreased activity tolerance;Decreased balance;Cardiopulmonary status limiting activity          PT Treatment Interventions DME instruction;Therapeutic activities;Modalities;Gait  training;Therapeutic exercise;Patient/family education;Stair training;Balance training;Functional mobility training;Neuromuscular re-education;Manual techniques    PT Goals (Current goals can be found in the Care Plan section)  Acute Rehab PT Goals Patient Stated Goal: go home and never feel this better again PT Goal Formulation: With patient Time For Goal Achievement: 10/11/16 Potential to Achieve  Goals: Good    Frequency Min 3X/week   Barriers to discharge        Co-evaluation               End of Session Equipment Utilized During Treatment: Oxygen Activity Tolerance: Patient limited by fatigue Patient left: in bed;with call bell/phone within reach Nurse Communication: Mobility status         Time: 1610-9604 PT Time Calculation (min) (ACUTE ONLY): 21 min   Charges:   PT Evaluation $PT Eval Moderate Complexity: 1 Procedure PT Treatments $Therapeutic Activity: 8-22 mins   PT G Codes:        Brelee Renk 10-29-2016, 2:02 PM

## 2016-10-05 DIAGNOSIS — J441 Chronic obstructive pulmonary disease with (acute) exacerbation: Secondary | ICD-10-CM

## 2016-10-05 DIAGNOSIS — J9602 Acute respiratory failure with hypercapnia: Secondary | ICD-10-CM

## 2016-10-05 DIAGNOSIS — J9601 Acute respiratory failure with hypoxia: Secondary | ICD-10-CM | POA: Diagnosis not present

## 2016-10-05 DIAGNOSIS — I10 Essential (primary) hypertension: Secondary | ICD-10-CM

## 2016-10-05 DIAGNOSIS — E785 Hyperlipidemia, unspecified: Secondary | ICD-10-CM | POA: Diagnosis not present

## 2016-10-05 LAB — RESPIRATORY PANEL BY PCR
ADENOVIRUS-RVPPCR: NOT DETECTED
BORDETELLA PERTUSSIS-RVPCR: NOT DETECTED
CHLAMYDOPHILA PNEUMONIAE-RVPPCR: NOT DETECTED
CORONAVIRUS 229E-RVPPCR: NOT DETECTED
CORONAVIRUS HKU1-RVPPCR: NOT DETECTED
Coronavirus NL63: NOT DETECTED
Coronavirus OC43: NOT DETECTED
INFLUENZA A H1 2009-RVPPR: NOT DETECTED
INFLUENZA A H1-RVPPCR: NOT DETECTED
INFLUENZA A H3-RVPPCR: NOT DETECTED
Influenza A: NOT DETECTED
Influenza B: NOT DETECTED
MYCOPLASMA PNEUMONIAE-RVPPCR: NOT DETECTED
Metapneumovirus: NOT DETECTED
Parainfluenza Virus 1: NOT DETECTED
Parainfluenza Virus 2: NOT DETECTED
Parainfluenza Virus 3: NOT DETECTED
Parainfluenza Virus 4: NOT DETECTED
RESPIRATORY SYNCYTIAL VIRUS-RVPPCR: DETECTED — AB
Rhinovirus / Enterovirus: NOT DETECTED

## 2016-10-05 MED ORDER — POLYETHYLENE GLYCOL 3350 17 G PO PACK
17.0000 g | PACK | Freq: Every day | ORAL | Status: DC
Start: 2016-10-05 — End: 2016-10-06
  Administered 2016-10-05 – 2016-10-06 (×2): 17 g via ORAL
  Filled 2016-10-05 (×2): qty 1

## 2016-10-05 MED ORDER — IPRATROPIUM-ALBUTEROL 0.5-2.5 (3) MG/3ML IN SOLN
3.0000 mL | Freq: Three times a day (TID) | RESPIRATORY_TRACT | Status: DC
  Administered 2016-10-05 – 2016-10-06 (×4): 3 mL via RESPIRATORY_TRACT
  Filled 2016-10-05 (×5): qty 3

## 2016-10-05 NOTE — Progress Notes (Signed)
Progress Note    Sally [name]  RUE:[id number] DOB: Feb 10, 1951  DOA: 09/29/2016 PCP: Dennis Bast, MD    Brief Narrative:   Chief complaint: Follow-up COPD exacerbation  Sally [name] is an 66 y.o. female with history of COPD, hypertension, hyperlipidemia and Crohn's disease in remission presents to the ER with complaints of increasing shortness of breath, productive cough. She is admitted for acute respiratory failure secondary to COPD exacerbation. Due to worsening respiratory status, she was transferred to step down unit for BiPAP.   Assessment/Plan:   Principal Problem: Acute hypoxemic and hypercapnic respiratory failure secondary to COPD exacerbation Currently being treated with empiric doxycycline, Nebs, steroids. Respiratory status has improved and she has not required BiPAP for the past 48 hours. Still mildly toxic in appearance and reports myalgias and headache. Will obtain a respiratory virus panel.  Active problems:  Essential hypertension Continue diltiazem, occasionally with high systolic pressure. Monitor as steroids are weaned.  Hyperlipidemia Continue lipitor.   Family Communication/Anticipated D/C date and plan/Code Status   DVT prophylaxis: Lovenox ordered. Code Status: Full Code.  Family Communication: Multiple family members updated at the bedside. Disposition Plan: Home in 24-48 hours depending on progress.   Medical Consultants:    None.   Procedures:    None  Anti-Infectives:    Doxycycline 09/29/16--->  Subjective:   The patient reports that Her shortness of breath has improved but she continues to have myalgias and a headache. Review of symptoms is positive for a dry cough and constipation as well as mild loss of appetite which has improved, and negative for vomiting.  Objective:    Vitals:   10/04/16 2337 10/05/16 0056 10/05/16 0300 10/05/16 0803  BP:  (!) 153/70 (!) 155/69 135/66  Pulse:  67 88 74  Resp:      Temp:  98.4  F (36.9 C) 97.5 F (36.4 C) 98.3 F (36.8 C)  TempSrc:  Oral Oral Axillary  SpO2: 95% 98% 95% 98%  Weight:   74.8 kg (165 lb)   Height:        Intake/Output Summary (Last 24 hours) at 10/05/16 0841 Last data filed at 10/05/16 0305  Gross per 24 hour  Intake             1200 ml  Output              350 ml  Net              850 ml   Filed Weights   10/03/16 0436 10/04/16 0349 10/05/16 0300  Weight: 76.2 kg (168 lb) 75.3 kg (166 lb) 74.8 kg (165 lb)    Exam: General exam: Appears calm, Sitting up in the chair. Respiratory system: Diminished with an occasional wheeze. Respiratory effort mildly increased. Cardiovascular system: S1 & S2 heard, RRR. No JVD,  rubs, gallops or clicks. No murmurs. Gastrointestinal system: Abdomen is nondistended, soft and nontender. No organomegaly or masses felt. Normal bowel sounds heard. Central nervous system: Alert and oriented. No focal neurological deficits. Extremities: No clubbing,  or cyanosis. No edema. Skin: No rashes, lesions or ulcers. Psychiatry: Judgement and insight appear normal. Mood & affect appropriate.   Data Reviewed:   I have personally reviewed following labs and imaging studies:  Labs: Basic Metabolic Panel:  Recent Labs Lab 09/29/16 2157 09/30/16 0344 10/01/16 0236 10/02/16 0430  NA 137 140 140 141  K 3.7 3.9 4.7 4.4  CL 103 103 103 103  CO2 23 26 27  30  GLUCOSE 157* 201* 165* 166*  BUN 10 9 15  32*  CREATININE 0.73 0.83 0.71 0.97  CALCIUM 8.7* 8.9 9.0 9.0   GFR Estimated Creatinine Clearance: 54.2 mL/min (by C-G formula based on SCr of 0.97 mg/dL). Liver Function Tests:  Recent Labs Lab 09/29/16 2157 09/30/16 0344  AST 27 20  ALT 25 24  ALKPHOS 143* 133*  BILITOT 0.3 0.2*  PROT 7.8 7.3  ALBUMIN 4.1 3.6   No results for input(s): LIPASE, AMYLASE in the last 168 hours. No results for input(s): AMMONIA in the last 168 hours. Coagulation profile No results for input(s): INR, PROTIME in the last 168  hours.  CBC:  Recent Labs Lab 09/29/16 2157 09/30/16 0344 10/01/16 0236 10/02/16 0430  WBC 10.7* 12.5* 18.3* 11.4*  NEUTROABS  --  12.0* 16.7* 10.0*  HGB 14.0 13.6 13.9 12.8  HCT 42.2 39.6 42.7 40.5  MCV 90.2 89.0 90.9 93.8  PLT 328 314 326 316   Cardiac Enzymes:  Recent Labs Lab 09/29/16 2157 09/30/16 0344  TROPONINI <0.03 <0.03   BNP (last 3 results) No results for input(s): PROBNP in the last 8760 hours. CBG: No results for input(s): GLUCAP in the last 168 hours. D-Dimer: No results for input(s): DDIMER in the last 72 hours. Hgb A1c: No results for input(s): HGBA1C in the last 72 hours. Lipid Profile: No results for input(s): CHOL, HDL, LDLCALC, TRIG, CHOLHDL, LDLDIRECT in the last 72 hours. Thyroid function studies: No results for input(s): TSH, T4TOTAL, T3FREE, THYROIDAB in the last 72 hours.  Invalid input(s): FREET3 Anemia work up: No results for input(s): VITAMINB12, FOLATE, FERRITIN, TIBC, IRON, RETICCTPCT in the last 72 hours. Sepsis Labs:  Recent Labs Lab 09/29/16 2157 09/30/16 0344 10/01/16 0236 10/02/16 0430  WBC 10.7* 12.5* 18.3* 11.4*    Microbiology No results found for this or any previous visit (from the past 240 hour(s)).  Radiology: No results found.  Medications:   . aspirin EC  81 mg Oral Daily  . atorvastatin  20 mg Oral QHS  . budesonide (PULMICORT) nebulizer solution  0.25 mg Nebulization BID  . diltiazem  240 mg Oral Daily  . doxycycline  100 mg Oral Q12H  . DULoxetine  30 mg Oral Daily  . enoxaparin (LOVENOX) injection  40 mg Subcutaneous Q24H  . ipratropium-albuterol  3 mL Nebulization QID  . predniSONE  50 mg Oral Q breakfast   Continuous Infusions:  4 or more problem point, 2 data points, high risk: Level III visit   Problems/DDx Points   Self limited or minor (max 2)       1   Established problem, stable       1   Established problem, worsening       2   New problem, no additional W/U planned (max 1)       3    New problem, additional W/U planned        4    Data Reviewed Points   Review/order clinical lab tests       1   Review/order x-rays       1   Review/order tests (Echo, EKG, PFTs, etc)       1   Discussion of test results w/ performing MD       1   Independent review of image, tracing or specimen       2   Decision to obtain old records       1   Review and summation  of old records       2    Level of risk Presenting prob Diagnostics Management   Minimal 1 self limited/minor Labs CXR EKG/EEG U/A U/S Rest Gargles Bandages Dressings   Low 2 or more self limited/minor 1 stable chronic Acute uncomplicated illness Tests (PFTS) Non-CV imaging Arterial labs Biopsies of skin OTC drugs Minor surgery-no risk PT OT IVF without additives    Moderate 1 or more chronic illnesses w/ mild exac, progression or S/E from tx 2 or more stable chronic illnesses Undiagnosed new problem w/ uncertain prognosis Acute complicated injury  Stress tests Endoscopies with no risk factors Deep needle or incisional bx CV imaging without risk LP Thoracentesis Paracentesis Minor surgery w/ risks Elective major surgery w/ no risk (open, percutaneous or endoscopic) Prescription drugs Therapeutic nucl med IVF with additives Closed tx of fracture/dislocation    High Severe exac of chronic illness Acute or chronic illness/injury may pose a threat to life or bodily function (ARF) Change in neuro status    CV imaging w/ contrast and risk Cardio electophysiologic tests Endoscopies w/ risk Discography Elective major surgery Emergency major surgery Parenteral controlled substances Drug therapy req monitoring for toxicity DNR/de-escalation of care    MDM Prob points Data points Risk   Straightforward    <1    <1    Min   Low complexity    2    2    Low   Moderate    3    3    Mod   High Complexity    4 or more    4 or more    High      LOS: 6 days   Gweneth Fredlund  [facility] Pager [phone]. If unable to reach me by pager, please call my cell phone at [phone].  *Please refer to [email or web address], password [email or web address] to get updated schedule on who will round on this patient, as hospitalists switch teams weekly. If 7PM-7AM, please contact night-coverage at [email or web address], password [email or web address] for any overnight needs.  10/05/2016, 8:41 AM

## 2016-10-05 NOTE — Progress Notes (Signed)
Physical Therapy Treatment Patient Details Name: Sally [name] MRN: [id number] DOB: 04/23/1951 Today's Date: 10/05/2016    History of Present Illness Sally [name] is a 66 y.o. female with history of COPD, hypertension, hyperlipidemia and Crohn's disease in remission presents to the ER with complaints of increasing shortness of breath, productive cough. She is admitted for acute respiratory failure secondary to COPD exacerbation    PT Comments    Pt agreeable to ambulation around bed. Pt keeping eyes shut for most of session.  Encouraged OOB, opened blinds, and educated on importance of mobility.  Will con't to follow.  Follow Up Recommendations  Home health PT     Equipment Recommendations  Other (comment) (to be determined)    Recommendations for Other Services       Precautions / Restrictions Precautions Precautions: Fall Restrictions Weight Bearing Restrictions: No    Mobility  Bed Mobility Overal bed mobility: Modified Independent             General bed mobility comments: increased time with HOB raised  Transfers Overall transfer level: Needs assistance Equipment used: 1 person hand held assist Transfers: Sit to/from Stand Sit to Stand: Min guard            Ambulation/Gait Ambulation/Gait assistance: Min assist Ambulation Distance (Feet): 10 Feet Assistive device: 1 person hand held assist Gait Pattern/deviations: Decreased step length - right;Decreased step length - left;Trunk flexed Gait velocity: decreased Gait velocity interpretation: Below normal speed for age/gender General Gait Details: Pt needing cues to open eyes.  HHA and cruising furniture around the bed to recliner.   Stairs            Wheelchair Mobility    Modified Rankin (Stroke Patients Only)       Balance             Standing balance-Leahy Scale: Poor Standing balance comment: required UE support                    Cognition Arousal/Alertness:  Awake/alert Behavior During Therapy: WFL for tasks assessed/performed Overall Cognitive Status: Within Functional Limits for tasks assessed                 General Comments: Pt not opening eyes    Exercises      General Comments        Pertinent Vitals/Pain Pain Assessment: Faces Faces Pain Scale: Hurts even more Pain Location: back, head Pain Descriptors / Indicators: Grimacing Pain Intervention(s): Repositioned;Heat applied    Home Living                      Prior Function            PT Goals (current goals can now be found in the care plan section) Acute Rehab PT Goals PT Goal Formulation: With patient Time For Goal Achievement: 10/11/16 Potential to Achieve Goals: Good Progress towards PT goals: Progressing toward goals    Frequency    Min 3X/week      PT Plan Current plan remains appropriate    Co-evaluation             End of Session Equipment Utilized During Treatment: Oxygen Activity Tolerance: Patient limited by fatigue Patient left: in chair;with call bell/phone within reach;with nursing/sitter in room;with family/visitor present     Time: 9449-6759 PT Time Calculation (min) (ACUTE ONLY): 16 min  Charges:  $Gait Training: 8-22 mins  G Codes:      Sally [name] 10/05/2016, 11:09 AM

## 2016-10-06 DIAGNOSIS — J210 Acute bronchiolitis due to respiratory syncytial virus: Secondary | ICD-10-CM | POA: Diagnosis present

## 2016-10-06 DIAGNOSIS — E785 Hyperlipidemia, unspecified: Secondary | ICD-10-CM | POA: Diagnosis not present

## 2016-10-06 DIAGNOSIS — J441 Chronic obstructive pulmonary disease with (acute) exacerbation: Secondary | ICD-10-CM | POA: Diagnosis not present

## 2016-10-06 DIAGNOSIS — J9601 Acute respiratory failure with hypoxia: Secondary | ICD-10-CM | POA: Diagnosis not present

## 2016-10-06 DIAGNOSIS — I10 Essential (primary) hypertension: Secondary | ICD-10-CM | POA: Diagnosis not present

## 2016-10-06 MED ORDER — HYDROXYZINE PAMOATE 25 MG PO CAPS: 25.0000 mg | ORAL_CAPSULE | Freq: Three times a day (TID) | ORAL | 2 refills | Status: DC | PRN

## 2016-10-06 MED ORDER — DULOXETINE HCL 30 MG PO CPEP: 30.0000 mg | ORAL_CAPSULE | Freq: Every day | ORAL | 3 refills | Status: DC

## 2016-10-06 MED ORDER — PREDNISONE 20 MG PO TABS: 40.0000 mg | ORAL_TABLET | Freq: Every day | ORAL | 0 refills | Status: DC

## 2016-10-06 NOTE — Progress Notes (Signed)
SATURATION QUALIFICATIONS: (This note is used to comply with regulatory documentation for home oxygen)  Patient Saturations on Room Air at Rest = 88%  Patient Saturations on Room Air while Ambulating = N/A  Patient Saturations on 3 Liters of oxygen while Ambulating = 92%  Please briefly explain why patient needs home oxygen:

## 2016-10-06 NOTE — Care Management (Addendum)
Case Management Note Initial Note started by Donn Pierini RN, BSN 09-30-16 Unit 2W-Case Manager [phone]  Patient Details  Name: Ariceli Stobaugh MRN: [id number] Date of Birth: [birth year]  Subjective/Objective:  Pt admitted with PNA/COPD                  Action/Plan: PTA pt lived at home with spouse- referral received for possible medication needs- spoke with pt at bedside- per pt she does not use home 02- however does have a nebulizer at home- no other DME. PCP- Andreas Blower. -- per pt she had BCBS up until first of the year- but as of Jan. 1- does not have insurance currently- pt will turn 66 next month and plans to sign up for Medicare- pt also asked about Medicaid- informed pt she could go to DSS and speak with someone about applying for Medicaid. Went over pt's medications with her- and she states that she has medications at home but is getting low on her Cymbalta. Uses [facility] -- checked pt's Hospital account notes- and per notes pt's insurance has been verified- with effective date of 09/26/16- will speak with pt again regarding insurance- as pt has active insurance would not qualify for [facility] program for any kind of medication assistance.   Expected Discharge Date:                         Expected Discharge Plan:  Home/Self Care  In-House Referral:     Discharge planning Services  CM Consult, Medication Assistance  Post Acute Care Choice: Durable Medical Equipment Choice offered to:  Patient   DME Arranged:   Oxygen DME Agency:   [facility]  HH Arranged:  N/A  HH Agency:   N/A  Status of Service:  Completed.   If discussed at Long Length of Stay Meetings, dates discussed:    Additional Comments: 1630 10-06-16 Tomi Bamberger, RN,BSN, [phone] Insurance verified as active. [facility] to provide pt with DME. DME 02 to be delivered to room. Pt declined HH PT services at this time. No further needs from CM at this time.    1556 10-06-16  Tomi Bamberger, RN,BSN [phone] CM did speak with pt in regards to DME 02.  Pt will need 02 for home. CM did provide referral to [facility] for 02. [facility] is checking insurance to see if active. Pt aware of plan. Awaiting return call in regards to DME.    1226 10-06-16 Tomi Bamberger, RN,BSN [phone]  Pt is from home with husband- per pt she gets $600.00 per month unable to pay the premiums on the insurance. BCBS is active in the system. Per pt she will be 66 in Feb and was advised by her insurance agent not to get any insurance and await Medicare. Pt has PCP Dennis Bast. CM did reach out to the Financial Counselor to see if they can assist with insurance verification. Pt will need HHPT Serivces- need to determine if therapy will be covered. Pt has DME nebulizer and may need home 02 once stable. CM will continue to monitor.

## 2016-10-06 NOTE — Discharge Summary (Signed)
Physician Discharge Summary  [name] ZOX:[id number] DOB: Aug 26, 1951 DOA: 09/29/2016  PCP: Dennis Bast, MD  Admit date: 09/29/2016 Discharge date: 10/06/2016  Admitted From: Home Discharge disposition: Home   Recommendations for Outpatient Follow-Up:   1. F/U with PCP in 1 week to reassess BP control and possible need for adjustment of antihypertensives. 2. Required supplemental oxygen at discharge, wean as tolerated.   Discharge Diagnosis:   Principal Problem:    Acute respiratory failure with hypoxia and hypercapnia (HCC) Active Problems:    COPD exacerbation (HCC)    Essential hypertension    Dyslipidemia    RSV (acute bronchiolitis due to respiratory syncytial virus)  Discharge Condition: Improved.  Diet recommendation: Low sodium, heart healthy.   History of Present Illness:   Sally [name] is an 66 y.o. female with history of COPD, hypertension, hyperlipidemia and Crohn's disease in remission presents to the ER with complaints of increasing shortness of breath, productive cough. She is admitted for acute respiratory failure secondary to COPD exacerbation. Due to worsening respiratory status, she was transferred to step down unit for BiPAP.    Hospital Course by Problem:   Principal Problem: Acute hypoxemic and hypercapnic respiratory failure secondary to COPD exacerbation from RSV Treated with empiric doxycycline, Nebs, steroids. Respiratory status has improved after requiring BiPAP earlier in her hospitalization. Respiratory virus panel + for RSV.  D/C home on 3 more days of oral prednisone.  No further antibiotics needed. Sent home on supplemental oxygen, wean as tolerated.  Active problems:  Essential hypertension Continue diltiazem.  Hyperlipidemia Continue lipitor.     Medical Consultants:    None.   Discharge Exam:   Vitals:   10/06/16 0741 10/06/16 1143  BP: (!) 158/76 (!) 176/79  Pulse: 68 81  Resp: 14 11  Temp: 98.1 F (36.7  C) 97.8 F (36.6 C)   Vitals:   10/06/16 0520 10/06/16 0741 10/06/16 0909 10/06/16 1143  BP: (!) 166/76 (!) 158/76  (!) 176/79  Pulse: 75 68  81  Resp: 17 14  11   Temp: 97.7 F (36.5 C) 98.1 F (36.7 C)  97.8 F (36.6 C)  TempSrc: Oral Oral  Oral  SpO2: 96% 98% 93% 95%  Weight: 75 kg (165 lb 6.4 oz)     Height: 5\' 1"  (1.549 m)       General exam: NAD. Respiratory system: Diminished with an occasional rare wheeze. Respiratory effort improved.  Cardiovascular system: S1 & S2 heard, RRR. No JVD,  rubs, gallops or clicks. No murmurs. Gastrointestinal system: Abdomen is nondistended, soft and nontender. No organomegaly or masses felt. Normal bowel sounds heard. Central nervous system: Alert and oriented. No focal neurological deficits. Extremities: No clubbing,  or cyanosis. No edema. Skin: No rashes, lesions or ulcers. Psychiatry: Judgement and insight appear normal. Mood & affect appropriate.    The results of significant diagnostics from this hospitalization (including imaging, microbiology, ancillary and laboratory) are listed below for reference.     Procedures and Diagnostic Studies:   Dg Chest 2 View  Result Date: 09/29/2016 CLINICAL DATA:  Increased shortness of breath.  History of asthma. EXAM: CHEST  2 VIEW COMPARISON:  07/13/2016 FINDINGS: Cardiomediastinal silhouette is normal. Mediastinal contours appear intact. Tortuosity of the aorta. There is no evidence of focal airspace consolidation, pleural effusion or pneumothorax. Chronic interstitial lung changes and upper lobe predominant emphysema noted. Osseous structures are without acute abnormality. Soft tissues are grossly normal. IMPRESSION: No active cardiopulmonary disease. Chronic emphysematous changes of the lungs. Electronically Signed  By: Ted Mcalpine M.D.   On: 09/29/2016 22:38     Labs:   Basic Metabolic Panel:  Recent Labs Lab 09/29/16 2157 09/30/16 0344 10/01/16 0236 10/02/16 0430  NA 137  140 140 141  K 3.7 3.9 4.7 4.4  CL 103 103 103 103  CO2 23 26 27 30   GLUCOSE 157* 201* 165* 166*  BUN 10 9 15  32*  CREATININE 0.73 0.83 0.71 0.97  CALCIUM 8.7* 8.9 9.0 9.0   GFR Estimated Creatinine Clearance: 54.3 mL/min (by C-G formula based on SCr of 0.97 mg/dL). Liver Function Tests:  Recent Labs Lab 09/29/16 2157 09/30/16 0344  AST 27 20  ALT 25 24  ALKPHOS 143* 133*  BILITOT 0.3 0.2*  PROT 7.8 7.3  ALBUMIN 4.1 3.6   No results for input(s): LIPASE, AMYLASE in the last 168 hours. No results for input(s): AMMONIA in the last 168 hours. Coagulation profile No results for input(s): INR, PROTIME in the last 168 hours.  CBC:  Recent Labs Lab 09/29/16 2157 09/30/16 0344 10/01/16 0236 10/02/16 0430  WBC 10.7* 12.5* 18.3* 11.4*  NEUTROABS  --  12.0* 16.7* 10.0*  HGB 14.0 13.6 13.9 12.8  HCT 42.2 39.6 42.7 40.5  MCV 90.2 89.0 90.9 93.8  PLT 328 314 326 316   Cardiac Enzymes:  Recent Labs Lab 09/29/16 2157 09/30/16 0344  TROPONINI <0.03 <0.03   BNP: Invalid input(s): POCBNP CBG: No results for input(s): GLUCAP in the last 168 hours. D-Dimer No results for input(s): DDIMER in the last 72 hours. Hgb A1c No results for input(s): HGBA1C in the last 72 hours. Lipid Profile No results for input(s): CHOL, HDL, LDLCALC, TRIG, CHOLHDL, LDLDIRECT in the last 72 hours. Thyroid function studies No results for input(s): TSH, T4TOTAL, T3FREE, THYROIDAB in the last 72 hours.  Invalid input(s): FREET3 Anemia work up No results for input(s): VITAMINB12, FOLATE, FERRITIN, TIBC, IRON, RETICCTPCT in the last 72 hours. Microbiology Recent Results (from the past 240 hour(s))  Respiratory Panel by PCR     Status: Abnormal   Collection Time: 10/05/16  1:20 PM  Result Value Ref Range Status   Adenovirus NOT DETECTED NOT DETECTED Final   Coronavirus 229E NOT DETECTED NOT DETECTED Final   Coronavirus HKU1 NOT DETECTED NOT DETECTED Final   Coronavirus NL63 NOT DETECTED NOT  DETECTED Final   Coronavirus OC43 NOT DETECTED NOT DETECTED Final   Metapneumovirus NOT DETECTED NOT DETECTED Final   Rhinovirus / Enterovirus NOT DETECTED NOT DETECTED Final   Influenza A NOT DETECTED NOT DETECTED Final   Influenza A H1 NOT DETECTED NOT DETECTED Final   Influenza A H1 2009 NOT DETECTED NOT DETECTED Final   Influenza A H3 NOT DETECTED NOT DETECTED Final   Influenza B NOT DETECTED NOT DETECTED Final   Parainfluenza Virus 1 NOT DETECTED NOT DETECTED Final   Parainfluenza Virus 2 NOT DETECTED NOT DETECTED Final   Parainfluenza Virus 3 NOT DETECTED NOT DETECTED Final   Parainfluenza Virus 4 NOT DETECTED NOT DETECTED Final   Respiratory Syncytial Virus DETECTED (A) NOT DETECTED Corrected    Comment: CRITICAL RESULT CALLED TO, READ BACK BY AND VERIFIED WITH: T TOURVILLE RN 1720 10/05/16 A BROWNING CORRECTED ON 01/10 AT 2214: PREVIOUSLY REPORTED AS DETECTED T TOURVILLE RN 1720 10/05/16 A BROWNING    Bordetella pertussis NOT DETECTED NOT DETECTED Final   Chlamydophila pneumoniae NOT DETECTED NOT DETECTED Final   Mycoplasma pneumoniae NOT DETECTED NOT DETECTED Final     Discharge Instructions:  Discharge Instructions    Call MD for:  difficulty breathing, headache or visual disturbances    Complete by:  As directed    Call MD for:  extreme fatigue    Complete by:  As directed    Call MD for:  persistant dizziness or light-headedness    Complete by:  As directed    Call MD for:  temperature >100.4    Complete by:  As directed    Diet - low sodium heart healthy    Complete by:  As directed    Increase activity slowly    Complete by:  As directed      Allergies as of 10/06/2016   No Known Allergies     Medication List    TAKE these medications   albuterol 108 (90 Base) MCG/ACT inhaler Commonly known as:  PROVENTIL HFA;VENTOLIN HFA Inhale 1-2 puffs into the lungs every 6 (six) hours as needed for wheezing or shortness of breath.   albuterol (2.5 MG/3ML) 0.083%  nebulizer solution Commonly known as:  PROVENTIL Take 2.5 mg by nebulization every 6 (six) hours as needed for wheezing or shortness of breath.   ANORO ELLIPTA 62.5-25 MCG/INH Aepb Generic drug:  umeclidinium-vilanterol Inhale 1 puff into the lungs daily.   aspirin EC 81 MG tablet Take 81 mg by mouth daily.   atorvastatin 20 MG tablet Commonly known as:  LIPITOR Take 20 mg by mouth at bedtime.   DILT-XR 240 MG 24 hr capsule Generic drug:  diltiazem Take 240 mg by mouth daily.   DULoxetine 30 MG capsule Commonly known as:  CYMBALTA Take 1 capsule (30 mg total) by mouth daily.   FLOVENT HFA 110 MCG/ACT inhaler Generic drug:  fluticasone Inhale 2 puffs into the lungs every 12 (twelve) hours.   hydrOXYzine 25 MG capsule Commonly known as:  VISTARIL Take 1 capsule (25 mg total) by mouth 3 (three) times daily as needed for anxiety.   predniSONE 20 MG tablet Commonly known as:  DELTASONE Take 2 tablets (40 mg total) by mouth daily with breakfast.      Follow-up Information    [facility] Follow up.   Why:  Oxygen Contact information: [address] [place] [address] [phone]            Time coordinating discharge: > 35 minutes.  Signed:  RAMA,CHRISTINA  Pager [phone] [facility] 10/06/2016, 8:38 PM

## 2017-09-26 ENCOUNTER — Encounter (HOSPITAL_COMMUNITY): Payer: Self-pay

## 2017-09-26 ENCOUNTER — Inpatient Hospital Stay (HOSPITAL_COMMUNITY)
Admission: EM | Admit: 2017-09-26 | Discharge: 2017-10-27 | DRG: 004 | Disposition: A | Discharge status: Rehabilitation Facility | Payer: Medicare Other | Attending: Internal Medicine | Admitting: Internal Medicine

## 2017-09-26 ENCOUNTER — Emergency Department (HOSPITAL_COMMUNITY): Payer: Medicare Other

## 2017-09-26 DIAGNOSIS — Z930 Tracheostomy status: Secondary | ICD-10-CM | POA: Diagnosis not present

## 2017-09-26 DIAGNOSIS — I10 Essential (primary) hypertension: Secondary | ICD-10-CM | POA: Diagnosis present

## 2017-09-26 DIAGNOSIS — E870 Hyperosmolality and hypernatremia: Secondary | ICD-10-CM | POA: Diagnosis not present

## 2017-09-26 DIAGNOSIS — Z7189 Other specified counseling: Secondary | ICD-10-CM | POA: Diagnosis not present

## 2017-09-26 DIAGNOSIS — J101 Influenza due to other identified influenza virus with other respiratory manifestations: Secondary | ICD-10-CM

## 2017-09-26 DIAGNOSIS — Z7952 Long term (current) use of systemic steroids: Secondary | ICD-10-CM

## 2017-09-26 DIAGNOSIS — G9340 Encephalopathy, unspecified: Secondary | ICD-10-CM | POA: Diagnosis present

## 2017-09-26 DIAGNOSIS — J42 Unspecified chronic bronchitis: Secondary | ICD-10-CM | POA: Diagnosis not present

## 2017-09-26 DIAGNOSIS — Z01818 Encounter for other preprocedural examination: Secondary | ICD-10-CM

## 2017-09-26 DIAGNOSIS — J9620 Acute and chronic respiratory failure, unspecified whether with hypoxia or hypercapnia: Secondary | ICD-10-CM | POA: Diagnosis not present

## 2017-09-26 DIAGNOSIS — G7281 Critical illness myopathy: Secondary | ICD-10-CM | POA: Diagnosis not present

## 2017-09-26 DIAGNOSIS — J9601 Acute respiratory failure with hypoxia: Secondary | ICD-10-CM | POA: Diagnosis not present

## 2017-09-26 DIAGNOSIS — D649 Anemia, unspecified: Secondary | ICD-10-CM | POA: Diagnosis not present

## 2017-09-26 DIAGNOSIS — K5090 Crohn's disease, unspecified, without complications: Secondary | ICD-10-CM | POA: Diagnosis present

## 2017-09-26 DIAGNOSIS — Z7951 Long term (current) use of inhaled steroids: Secondary | ICD-10-CM

## 2017-09-26 DIAGNOSIS — J95811 Postprocedural pneumothorax: Secondary | ICD-10-CM | POA: Diagnosis not present

## 2017-09-26 DIAGNOSIS — Z8249 Family history of ischemic heart disease and other diseases of the circulatory system: Secondary | ICD-10-CM

## 2017-09-26 DIAGNOSIS — Z9841 Cataract extraction status, right eye: Secondary | ICD-10-CM

## 2017-09-26 DIAGNOSIS — F419 Anxiety disorder, unspecified: Secondary | ICD-10-CM | POA: Diagnosis not present

## 2017-09-26 DIAGNOSIS — J441 Chronic obstructive pulmonary disease with (acute) exacerbation: Secondary | ICD-10-CM | POA: Diagnosis present

## 2017-09-26 DIAGNOSIS — Z9689 Presence of other specified functional implants: Secondary | ICD-10-CM

## 2017-09-26 DIAGNOSIS — K219 Gastro-esophageal reflux disease without esophagitis: Secondary | ICD-10-CM | POA: Diagnosis present

## 2017-09-26 DIAGNOSIS — E785 Hyperlipidemia, unspecified: Secondary | ICD-10-CM | POA: Diagnosis present

## 2017-09-26 DIAGNOSIS — R1310 Dysphagia, unspecified: Secondary | ICD-10-CM | POA: Diagnosis not present

## 2017-09-26 DIAGNOSIS — D62 Acute posthemorrhagic anemia: Secondary | ICD-10-CM | POA: Diagnosis not present

## 2017-09-26 DIAGNOSIS — J9602 Acute respiratory failure with hypercapnia: Secondary | ICD-10-CM | POA: Diagnosis not present

## 2017-09-26 DIAGNOSIS — J410 Simple chronic bronchitis: Secondary | ICD-10-CM | POA: Diagnosis not present

## 2017-09-26 DIAGNOSIS — M7989 Other specified soft tissue disorders: Secondary | ICD-10-CM | POA: Diagnosis not present

## 2017-09-26 DIAGNOSIS — F17210 Nicotine dependence, cigarettes, uncomplicated: Secondary | ICD-10-CM | POA: Diagnosis present

## 2017-09-26 DIAGNOSIS — E1165 Type 2 diabetes mellitus with hyperglycemia: Secondary | ICD-10-CM | POA: Diagnosis present

## 2017-09-26 DIAGNOSIS — D72829 Elevated white blood cell count, unspecified: Secondary | ICD-10-CM | POA: Diagnosis not present

## 2017-09-26 DIAGNOSIS — Z9889 Other specified postprocedural states: Secondary | ICD-10-CM | POA: Diagnosis not present

## 2017-09-26 DIAGNOSIS — J1000 Influenza due to other identified influenza virus with unspecified type of pneumonia: Secondary | ICD-10-CM | POA: Diagnosis present

## 2017-09-26 DIAGNOSIS — J939 Pneumothorax, unspecified: Secondary | ICD-10-CM | POA: Diagnosis not present

## 2017-09-26 DIAGNOSIS — J9621 Acute and chronic respiratory failure with hypoxia: Secondary | ICD-10-CM | POA: Diagnosis present

## 2017-09-26 DIAGNOSIS — Z86718 Personal history of other venous thrombosis and embolism: Secondary | ICD-10-CM | POA: Diagnosis not present

## 2017-09-26 DIAGNOSIS — J9801 Acute bronchospasm: Secondary | ICD-10-CM | POA: Diagnosis present

## 2017-09-26 DIAGNOSIS — I82409 Acute embolism and thrombosis of unspecified deep veins of unspecified lower extremity: Secondary | ICD-10-CM | POA: Diagnosis not present

## 2017-09-26 DIAGNOSIS — Z978 Presence of other specified devices: Secondary | ICD-10-CM

## 2017-09-26 DIAGNOSIS — Z79899 Other long term (current) drug therapy: Secondary | ICD-10-CM

## 2017-09-26 DIAGNOSIS — Z7982 Long term (current) use of aspirin: Secondary | ICD-10-CM

## 2017-09-26 DIAGNOSIS — J449 Chronic obstructive pulmonary disease, unspecified: Secondary | ICD-10-CM | POA: Diagnosis not present

## 2017-09-26 DIAGNOSIS — Y658 Other specified misadventures during surgical and medical care: Secondary | ICD-10-CM | POA: Diagnosis not present

## 2017-09-26 DIAGNOSIS — Y654 Failure to introduce or to remove other tube or instrument: Secondary | ICD-10-CM

## 2017-09-26 DIAGNOSIS — R0600 Dyspnea, unspecified: Secondary | ICD-10-CM | POA: Diagnosis not present

## 2017-09-26 DIAGNOSIS — Z4682 Encounter for fitting and adjustment of non-vascular catheter: Secondary | ICD-10-CM

## 2017-09-26 DIAGNOSIS — M21371 Foot drop, right foot: Secondary | ICD-10-CM | POA: Diagnosis not present

## 2017-09-26 DIAGNOSIS — Z430 Encounter for attention to tracheostomy: Secondary | ICD-10-CM

## 2017-09-26 DIAGNOSIS — Y92230 Patient room in hospital as the place of occurrence of the external cause: Secondary | ICD-10-CM | POA: Diagnosis not present

## 2017-09-26 DIAGNOSIS — J9622 Acute and chronic respiratory failure with hypercapnia: Secondary | ICD-10-CM | POA: Diagnosis present

## 2017-09-26 DIAGNOSIS — Z9842 Cataract extraction status, left eye: Secondary | ICD-10-CM

## 2017-09-26 DIAGNOSIS — S270XXA Traumatic pneumothorax, initial encounter: Secondary | ICD-10-CM | POA: Diagnosis not present

## 2017-09-26 DIAGNOSIS — R0902 Hypoxemia: Secondary | ICD-10-CM

## 2017-09-26 DIAGNOSIS — Z4659 Encounter for fitting and adjustment of other gastrointestinal appliance and device: Secondary | ICD-10-CM

## 2017-09-26 DIAGNOSIS — Z90710 Acquired absence of both cervix and uterus: Secondary | ICD-10-CM

## 2017-09-26 DIAGNOSIS — I82492 Acute embolism and thrombosis of other specified deep vein of left lower extremity: Secondary | ICD-10-CM | POA: Diagnosis not present

## 2017-09-26 DIAGNOSIS — J9600 Acute respiratory failure, unspecified whether with hypoxia or hypercapnia: Secondary | ICD-10-CM

## 2017-09-26 DIAGNOSIS — I82402 Acute embolism and thrombosis of unspecified deep veins of left lower extremity: Secondary | ICD-10-CM | POA: Diagnosis not present

## 2017-09-26 DIAGNOSIS — E876 Hypokalemia: Secondary | ICD-10-CM | POA: Diagnosis not present

## 2017-09-26 DIAGNOSIS — M21372 Foot drop, left foot: Secondary | ICD-10-CM | POA: Diagnosis not present

## 2017-09-26 DIAGNOSIS — J9690 Respiratory failure, unspecified, unspecified whether with hypoxia or hypercapnia: Secondary | ICD-10-CM

## 2017-09-26 DIAGNOSIS — J440 Chronic obstructive pulmonary disease with acute lower respiratory infection: Secondary | ICD-10-CM | POA: Diagnosis present

## 2017-09-26 DIAGNOSIS — Z7409 Other reduced mobility: Secondary | ICD-10-CM | POA: Diagnosis not present

## 2017-09-26 DIAGNOSIS — G6281 Critical illness polyneuropathy: Secondary | ICD-10-CM | POA: Diagnosis not present

## 2017-09-26 DIAGNOSIS — E871 Hypo-osmolality and hyponatremia: Secondary | ICD-10-CM | POA: Diagnosis present

## 2017-09-26 DIAGNOSIS — F4321 Adjustment disorder with depressed mood: Secondary | ICD-10-CM | POA: Diagnosis not present

## 2017-09-26 DIAGNOSIS — Z9289 Personal history of other medical treatment: Secondary | ICD-10-CM

## 2017-09-26 LAB — I-STAT ARTERIAL BLOOD GAS, ED
Acid-Base Excess: 1 mmol/L (ref 0.0–2.0)
Bicarbonate: 29.2 mmol/L — ABNORMAL HIGH (ref 20.0–28.0)
O2 Saturation: 100 %
TCO2: 31 mmol/L (ref 22–32)
pCO2 arterial: 62.3 mmHg — ABNORMAL HIGH (ref 32.0–48.0)
pH, Arterial: 7.278 — ABNORMAL LOW (ref 7.350–7.450)
pO2, Arterial: 590 mmHg — ABNORMAL HIGH (ref 83.0–108.0)

## 2017-09-26 LAB — BASIC METABOLIC PANEL
Anion gap: 13 (ref 5–15)
BUN: 18 mg/dL (ref 6–20)
CO2: 24 mmol/L (ref 22–32)
Calcium: 8.8 mg/dL — ABNORMAL LOW (ref 8.9–10.3)
Chloride: 97 mmol/L — ABNORMAL LOW (ref 101–111)
Creatinine, Ser: 0.98 mg/dL (ref 0.44–1.00)
GFR calc Af Amer: >60 mL/min (ref >60)
GFR calc non Af Amer: 59 mL/min — ABNORMAL LOW (ref >60)
Glucose, Bld: 276 mg/dL — ABNORMAL HIGH (ref 65–99)
Potassium: 4.4 mmol/L (ref 3.5–5.1)
Sodium: 134 mmol/L — ABNORMAL LOW (ref 135–145)

## 2017-09-26 LAB — CBC WITH DIFFERENTIAL/PLATELET
Basophils Absolute: 0 10*3/uL (ref 0.0–0.1)
Basophils Relative: 0 %
Eosinophils Absolute: 0 10*3/uL (ref 0.0–0.7)
Eosinophils Relative: 0 %
HCT: 41.9 % (ref 36.0–46.0)
Hemoglobin: 13.9 g/dL (ref 12.0–15.0)
Lymphocytes Relative: 14 %
Lymphs Abs: 3.1 10*3/uL (ref 0.7–4.0)
MCH: 31 pg (ref 26.0–34.0)
MCHC: 33.2 g/dL (ref 30.0–36.0)
MCV: 93.3 fL (ref 78.0–100.0)
Monocytes Absolute: 1.3 10*3/uL — ABNORMAL HIGH (ref 0.1–1.0)
Monocytes Relative: 6 %
Neutro Abs: 17.5 10*3/uL — ABNORMAL HIGH (ref 1.7–7.7)
Neutrophils Relative %: 80 %
Platelets: 514 10*3/uL — ABNORMAL HIGH (ref 150–400)
RBC: 4.49 MIL/uL (ref 3.87–5.11)
RDW: 13.8 % (ref 11.5–15.5)
WBC: 21.9 10*3/uL — ABNORMAL HIGH (ref 4.0–10.5)

## 2017-09-26 LAB — CBG MONITORING, ED: GLUCOSE-CAPILLARY: 259 mg/dL — AB (ref 65–99)

## 2017-09-26 LAB — CBC
HCT: 40.8 % (ref 36.0–46.0)
HEMOGLOBIN: 13.5 g/dL (ref 12.0–15.0)
MCH: 30.9 pg (ref 26.0–34.0)
MCHC: 33.1 g/dL (ref 30.0–36.0)
MCV: 93.4 fL (ref 78.0–100.0)
PLATELETS: 412 10*3/uL — AB (ref 150–400)
RBC: 4.37 MIL/uL (ref 3.87–5.11)
RDW: 14 % (ref 11.5–15.5)
WBC: 16.7 10*3/uL — ABNORMAL HIGH (ref 4.0–10.5)

## 2017-09-26 LAB — GLUCOSE, CAPILLARY: Glucose-Capillary: 141 mg/dL — ABNORMAL HIGH (ref 65–99)

## 2017-09-26 LAB — TRIGLYCERIDES: TRIGLYCERIDES: 236 mg/dL — AB (ref <150)

## 2017-09-26 LAB — CREATININE, SERUM
CREATININE: 1.09 mg/dL — AB (ref 0.44–1.00)
GFR calc Af Amer: >60 mL/min (ref >60)
GFR calc non Af Amer: 52 mL/min — ABNORMAL LOW (ref >60)

## 2017-09-26 LAB — MAGNESIUM: Magnesium: 2.4 mg/dL (ref 1.7–2.4)

## 2017-09-26 LAB — PHOSPHORUS: Phosphorus: 4.6 mg/dL (ref 2.5–4.6)

## 2017-09-26 MED ORDER — ROCURONIUM BROMIDE 50 MG/5ML IV SOLN
INTRAVENOUS | Status: AC | PRN
  Administered 2017-09-26: 80 mg via INTRAVENOUS

## 2017-09-26 MED ORDER — PROPOFOL 1000 MG/100ML IV EMUL
5.0000 ug/kg/min | INTRAVENOUS | Status: DC
  Administered 2017-09-26: 10 ug/kg/min via INTRAVENOUS
  Filled 2017-09-26 (×2): qty 100

## 2017-09-26 MED ORDER — ALBUTEROL SULFATE (2.5 MG/3ML) 0.083% IN NEBU
2.5000 mg | INHALATION_SOLUTION | RESPIRATORY_TRACT | Status: DC | PRN
  Administered 2017-10-14: 2.5 mg via RESPIRATORY_TRACT
  Filled 2017-09-26: qty 3

## 2017-09-26 MED ORDER — ONDANSETRON HCL 4 MG/2ML IJ SOLN
4.0000 mg | Freq: Four times a day (QID) | INTRAMUSCULAR | Status: DC | PRN
  Administered 2017-10-10 – 2017-10-17 (×4): 4 mg via INTRAVENOUS
  Filled 2017-09-26 (×4): qty 2

## 2017-09-26 MED ORDER — PRO-STAT SUGAR FREE PO LIQD
30.0000 mL | Freq: Two times a day (BID) | ORAL | Status: DC
  Administered 2017-09-26 – 2017-09-27 (×2): 30 mL
  Filled 2017-09-26 (×3): qty 30

## 2017-09-26 MED ORDER — PROPOFOL 1000 MG/100ML IV EMUL
0.0000 ug/kg/min | INTRAVENOUS | Status: DC
  Administered 2017-09-26: 40 ug/kg/min via INTRAVENOUS
  Administered 2017-09-27: 25 ug/kg/min via INTRAVENOUS
  Administered 2017-09-27 (×2): 50 ug/kg/min via INTRAVENOUS
  Administered 2017-09-28 (×3): 30 ug/kg/min via INTRAVENOUS
  Administered 2017-09-28: 25 ug/kg/min via INTRAVENOUS
  Administered 2017-09-29 – 2017-09-30 (×3): 30 ug/kg/min via INTRAVENOUS
  Filled 2017-09-26 (×10): qty 100

## 2017-09-26 MED ORDER — ETOMIDATE 2 MG/ML IV SOLN
INTRAVENOUS | Status: AC | PRN
  Administered 2017-09-26: 20 mg via INTRAVENOUS

## 2017-09-26 MED ORDER — ALBUTEROL SULFATE (2.5 MG/3ML) 0.083% IN NEBU
2.5000 mg | INHALATION_SOLUTION | RESPIRATORY_TRACT | Status: DC
  Administered 2017-09-26 – 2017-10-13 (×99): 2.5 mg via RESPIRATORY_TRACT
  Filled 2017-09-26 (×98): qty 3

## 2017-09-26 MED ORDER — LORAZEPAM 2 MG/ML IJ SOLN
1.0000 mg | Freq: Once | INTRAMUSCULAR | Status: AC
  Administered 2017-09-26: 1 mg via INTRAVENOUS

## 2017-09-26 MED ORDER — FENTANYL CITRATE (PF) 100 MCG/2ML IJ SOLN: 50.0000 ug | INTRAMUSCULAR | Status: DC | PRN

## 2017-09-26 MED ORDER — DILTIAZEM HCL ER COATED BEADS 240 MG PO CP24
240.0000 mg | ORAL_CAPSULE | Freq: Every day | ORAL | Status: DC
  Filled 2017-09-26 (×2): qty 1

## 2017-09-26 MED ORDER — ALBUTEROL (5 MG/ML) CONTINUOUS INHALATION SOLN
15.0000 mg/h | INHALATION_SOLUTION | RESPIRATORY_TRACT | Status: DC
  Administered 2017-09-26: 15 mg/h via RESPIRATORY_TRACT
  Filled 2017-09-26: qty 20

## 2017-09-26 MED ORDER — ACETAMINOPHEN 325 MG PO TABS
650.0000 mg | ORAL_TABLET | ORAL | Status: DC | PRN
  Administered 2017-10-11: 650 mg via ORAL
  Filled 2017-09-26: qty 2

## 2017-09-26 MED ORDER — MIDAZOLAM HCL 2 MG/2ML IJ SOLN
1.0000 mg | INTRAMUSCULAR | Status: DC | PRN
  Administered 2017-09-27 – 2017-09-28 (×2): 1 mg via INTRAVENOUS
  Filled 2017-09-26 (×4): qty 2

## 2017-09-26 MED ORDER — MIDAZOLAM HCL 2 MG/2ML IJ SOLN
1.0000 mg | INTRAMUSCULAR | Status: DC | PRN
  Administered 2017-09-27: 1 mg via INTRAVENOUS
  Administered 2017-09-28: 2 mg via INTRAVENOUS
  Filled 2017-09-26: qty 2

## 2017-09-26 MED ORDER — FENTANYL CITRATE (PF) 100 MCG/2ML IJ SOLN
INTRAMUSCULAR | Status: AC | PRN
  Administered 2017-09-26: 100 ug via INTRAVENOUS

## 2017-09-26 MED ORDER — HEPARIN SODIUM (PORCINE) 5000 UNIT/ML IJ SOLN
5000.0000 [IU] | Freq: Three times a day (TID) | INTRAMUSCULAR | Status: DC
  Administered 2017-09-26 – 2017-10-27 (×91): 5000 [IU] via SUBCUTANEOUS
  Filled 2017-09-26 (×94): qty 1

## 2017-09-26 MED ORDER — IPRATROPIUM BROMIDE 0.02 % IN SOLN
0.5000 mg | Freq: Once | RESPIRATORY_TRACT | Status: AC
  Administered 2017-09-26: 0.5 mg via RESPIRATORY_TRACT
  Filled 2017-09-26: qty 2.5

## 2017-09-26 MED ORDER — PROPOFOL 1000 MG/100ML IV EMUL
INTRAVENOUS | Status: AC
  Filled 2017-09-26: qty 100

## 2017-09-26 MED ORDER — FAMOTIDINE IN NACL 20-0.9 MG/50ML-% IV SOLN
20.0000 mg | Freq: Two times a day (BID) | INTRAVENOUS | Status: DC
  Administered 2017-09-26 – 2017-10-12 (×33): 20 mg via INTRAVENOUS
  Filled 2017-09-26 (×35): qty 50

## 2017-09-26 MED ORDER — FENTANYL CITRATE (PF) 100 MCG/2ML IJ SOLN
50.0000 ug | INTRAMUSCULAR | Status: AC | PRN
  Administered 2017-09-27 (×3): 50 ug via INTRAVENOUS
  Filled 2017-09-26 (×3): qty 2

## 2017-09-26 MED ORDER — FENTANYL CITRATE (PF) 100 MCG/2ML IJ SOLN
INTRAMUSCULAR | Status: AC
  Filled 2017-09-26: qty 2

## 2017-09-26 MED ORDER — VITAL HIGH PROTEIN PO LIQD
1000.0000 mL | ORAL | Status: DC
  Administered 2017-09-26: 1000 mL

## 2017-09-26 MED ORDER — SODIUM CHLORIDE 0.9 % IV SOLN
250.0000 mL | INTRAVENOUS | Status: DC | PRN
  Administered 2017-10-06 – 2017-10-11 (×3): 250 mL via INTRAVENOUS

## 2017-09-26 NOTE — ED Notes (Signed)
Chaplain paged to contact family

## 2017-09-26 NOTE — H&P (Addendum)
PULMONARY / CRITICAL CARE MEDICINE   Name: Gwenn Teodoro MRN: [id number] DOB: May 25, 1951    ADMISSION DATE:  09/26/2017 CONSULTATION DATE:  09/26/2017  REFERRING MD:  Juleen China  CHIEF COMPLAINT:  Dyspnea  HISTORY OF PRESENT ILLNESS:   67 year old female with a past medical history significant for severe COPD came to the emergency department on September 26, 2017 complaining of shortness of breath.  Her husband provides the history because she was intubated on my arrival.  He says that she continues to smoke cigarettes, greater than 1 pack/day.  She uses albuterol about 3 times a day but does not take any other inhalers.  She does not use oxygen.  About a week ago they had a cough or cold and were prescribed a Z-Pak.  She said that her symptoms persisted.  He denied fevers or chills.  She came to the emergency department and was initially treated with BiPAP but she developed worsening respiratory failure and required intubation.  PAST MEDICAL HISTORY :  She  has a past medical history of Asthma, Back pain, COPD (chronic obstructive pulmonary disease) (HCC), Crohn disease (HCC), and Hypertension.  PAST SURGICAL HISTORY: She  has a past surgical history that includes Abdominal hysterectomy; Fracture surgery (Left); and Cataract extraction, bilateral.  No Known Allergies  No current facility-administered medications on file prior to encounter.    Current Outpatient Medications on File Prior to Encounter  Medication Sig  . albuterol (PROVENTIL HFA;VENTOLIN HFA) 108 (90 Base) MCG/ACT inhaler Inhale 1-2 puffs into the lungs every 6 (six) hours as needed for wheezing or shortness of breath.  Marland Kitchen albuterol (PROVENTIL) (2.5 MG/3ML) 0.083% nebulizer solution Take 2.5 mg by nebulization every 6 (six) hours as needed for wheezing or shortness of breath.   Ailene Ards ELLIPTA 62.5-25 MCG/INH AEPB Inhale 1 puff into the lungs daily.   Marland Kitchen aspirin EC 81 MG tablet Take 81 mg by mouth daily.  Marland Kitchen atorvastatin (LIPITOR) 20  MG tablet Take 20 mg by mouth at bedtime.  Marland Kitchen DILT-XR 240 MG 24 hr capsule Take 240 mg by mouth daily.  . DULoxetine (CYMBALTA) 30 MG capsule Take 1 capsule (30 mg total) by mouth daily.  Marland Kitchen FLOVENT HFA 110 MCG/ACT inhaler Inhale 2 puffs into the lungs every 12 (twelve) hours.   . hydrOXYzine (VISTARIL) 25 MG capsule Take 1 capsule (25 mg total) by mouth 3 (three) times daily as needed for anxiety.  . predniSONE (DELTASONE) 20 MG tablet Take 2 tablets (40 mg total) by mouth daily with breakfast.    FAMILY HISTORY:  Her indicated that the status of her other is unknown.   SOCIAL HISTORY: She  reports that she has been smoking.  she has never used smokeless tobacco. She reports that she does not drink alcohol or use drugs.  REVIEW OF SYSTEMS:   Cannot obtain due to intubation  SUBJECTIVE:  As above  VITAL SIGNS: BP 125/82   Pulse 99   Resp (!) 24   Ht 5\' 1"  (1.549 m)   Wt 72.6 kg (160 lb)   SpO2 100%   BMI 30.23 kg/m   HEMODYNAMICS:    VENTILATOR SETTINGS: Vent Mode: PRVC FiO2 (%):  [40 %-100 %] 40 % Set Rate:  [18 bmp-24 bmp] 24 bmp Vt Set:  [380 mL] 380 mL PEEP:  [5 cmH20] 5 cmH20 Plateau Pressure:  [23 cmH20] 23 cmH20  INTAKE / OUTPUT: No intake/output data recorded.  PHYSICAL EXAMINATION:  General:  In bed on vent HENT: NCAT ETT in  place PULM: Wheezing, poor air movement B, vent supported breathing CV: RRR, no mgr GI: BS+, soft, nontender MSK: normal bulk and tone Neuro: sedated on vent    LABS:  BMET Recent Labs  Lab 09/26/17 1446  NA 134*  K 4.4  CL 97*  CO2 24  BUN 18  CREATININE 0.98  GLUCOSE 276*    Electrolytes Recent Labs  Lab 09/26/17 1446  CALCIUM 8.8*    CBC Recent Labs  Lab 09/26/17 1446  WBC 21.9*  HGB 13.9  HCT 41.9  PLT 514*    Coag's No results for input(s): APTT, INR in the last 168 hours.  Sepsis Markers No results for input(s): LATICACIDVEN, PROCALCITON, O2SATVEN in the last 168 hours.  ABG Recent Labs   Lab 09/26/17 1654  PHART 7.278*  PCO2ART 62.3*  PO2ART 590.0*    Liver Enzymes No results for input(s): AST, ALT, ALKPHOS, BILITOT, ALBUMIN in the last 168 hours.  Cardiac Enzymes No results for input(s): TROPONINI, PROBNP in the last 168 hours.  Glucose Recent Labs  Lab 09/26/17 1452  GLUCAP 259*    Imaging Dg Chest Portable 1 View  Result Date: 09/26/2017 CLINICAL DATA:  Respiratory failure.  Check ETT position. EXAM: PORTABLE CHEST 1 VIEW COMPARISON:  None. FINDINGS: The heart size and mediastinal contours are within normal limits. The tip of an endotracheal tube is 5.5 cm above the carina, in satisfactory position. A gastric tube with tip and side-port side port project within the expected location the stomach. Intramedullary nail fixation of the left humerus. IMPRESSION: 1. Satisfactory support line and tube positions. 2. Pulmonary hyperinflation. Electronically Signed   By: Tollie Eth M.D.   On: 09/26/2017 16:16   Dg Chest Portable 1 View  Result Date: 09/26/2017 CLINICAL DATA:  Shortness of breath for the past 12 hours. EXAM: PORTABLE CHEST 1 VIEW COMPARISON:  Chest x-ray dated December 07, 2016. FINDINGS: The heart size and mediastinal contours are within normal limits. Both lungs are clear. The visualized skeletal structures are unremarkable. IMPRESSION: No active disease. Electronically Signed   By: Obie Dredge M.D.   On: 09/26/2017 14:59     STUDIES:  Chest x-ray shows endotracheal tube in place, emphysema  CULTURES: September 26, 2017 respiratory culture September 26, 2017 respiratory viral panel  ANTIBIOTICS: Levaquin  SIGNIFICANT EVENTS:   LINES/TUBES: January 1 endotracheal tube  DISCUSSION: 67 year old female with a past medical history significant for severe COPD admitted for a COPD exacerbation causing acute respiratory failure with hypoxemia necessitating mechanical ventilation.  ASSESSMENT / PLAN:  PULMONARY A: Acute respiratory failure with  hypoxemia COPD exacerbation P:   Solu-Medrol twice daily DuoNeb every 6 hours Full mechanical vent support VAP prevention Daily WUA/SBT   CARDIOVASCULAR A:  No acute issues P:  Telemetry monitoring  RENAL A:   No acute issues P:   Monitor BMET and UOP Replace electrolytes as needed   GASTROINTESTINAL A:   GERD P:   Pepcid Tube feeding per protocol  HEMATOLOGIC A:   No acute issues P:  Monitor for bleeding  INFECTIOUS A:   COPD exacerbation P:   Check respiratory viral panel Levaquin  ENDOCRINE A:   No acute issues P:   Monitor glucose  NEUROLOGIC A:   Acute encephalopathy secondary to intubation/hypercarbic respiratory failure P:   RASS goal: 0--1 Propofol infusion, as needed fentanyl, as needed Versed per PAD protocol   FAMILY  - Updates: husband updated bedside by me  - Inter-disciplinary family meet or Palliative Care  meeting due by:  day 7  My cc time 45 minutes  Heber Hidalgo, MD [place] [facility] Pager: [phone] Cell: [phone] After 3pm or if no response, call [phone]   09/26/2017, 5:53 PM

## 2017-09-26 NOTE — ED Notes (Signed)
Pt needed suctioning, showed obvious signs of discomfort rate increased by 

## 2017-09-26 NOTE — ED Triage Notes (Signed)
Pt arrived via [facility] from home c/o SOB x12 hours. Hx-COPD. EMS gave 1 SL Nitro, 125mg  Solumedrol.  Pt arrives anxious pulling of CPAP and mottled.

## 2017-09-26 NOTE — ED Notes (Signed)
Family at bedside. 

## 2017-09-26 NOTE — ED Notes (Signed)
Pt moving swiping at face propofol increased

## 2017-09-26 NOTE — ED Provider Notes (Signed)
[facility] EMERGENCY DEPARTMENT Provider Note   CSN: [id number] Arrival date & time: 09/26/17  1442     History   Chief Complaint Chief Complaint  Patient presents with  . Respiratory Distress    HPI Helane Vanroy is a 67 y.o. female.  HPI   67 year old female no respiratory distress.  Emesis bring her from home.  Worsening breathing over the past 12 hours.  She reports a history of COPD but is not on home oxygen.  On EMS arrival she is very anxious tachypneic and diffuse wheezing.  She was given 125 mg of Solu-Medrol tried starting her on CPAP but she could not tolerate it very well because of anxiety.  Past Medical History:  Diagnosis Date  . Asthma   . Back pain   . COPD (chronic obstructive pulmonary disease) (HCC)   . Crohn disease (HCC)   . Hypertension     Patient Active Problem List   Diagnosis Date Noted  . RSV (acute bronchiolitis due to respiratory syncytial virus) 10/06/2016  . COPD exacerbation (HCC) 07/14/2016  . Acute respiratory failure with hypoxia and hypercapnia (HCC)   . Essential hypertension   . Dyslipidemia     Past Surgical History:  Procedure Laterality Date  . ABDOMINAL HYSTERECTOMY    . CATARACT EXTRACTION, BILATERAL    . FRACTURE SURGERY Left    ARM    OB History    No data available       Home Medications    Prior to Admission medications   Medication Sig Start Date End Date Taking? Authorizing Provider  albuterol (PROVENTIL HFA;VENTOLIN HFA) 108 (90 Base) MCG/ACT inhaler Inhale 1-2 puffs into the lungs every 6 (six) hours as needed for wheezing or shortness of breath.    [provider]  albuterol (PROVENTIL) (2.5 MG/3ML) 0.083% nebulizer solution Take 2.5 mg by nebulization every 6 (six) hours as needed for wheezing or shortness of breath.  06/03/16   [provider]  ANORO ELLIPTA 62.5-25 MCG/INH AEPB Inhale 1 puff into the lungs daily.  06/30/16   [provider]  aspirin EC 81 MG  tablet Take 81 mg by mouth daily.    [provider]  atorvastatin (LIPITOR) 20 MG tablet Take 20 mg by mouth at bedtime. 05/05/16   [provider]  DILT-XR 240 MG 24 hr capsule Take 240 mg by mouth daily. 06/30/16   [provider]  DULoxetine (CYMBALTA) 30 MG capsule Take 1 capsule (30 mg total) by mouth daily. 10/06/16 01/04/17  Rama, Maryruth Bun, MD  FLOVENT HFA 110 MCG/ACT inhaler Inhale 2 puffs into the lungs every 12 (twelve) hours.  06/30/16   [provider]  hydrOXYzine (VISTARIL) 25 MG capsule Take 1 capsule (25 mg total) by mouth 3 (three) times daily as needed for anxiety. 10/06/16   Rama, Maryruth Bun, MD  predniSONE (DELTASONE) 20 MG tablet Take 2 tablets (40 mg total) by mouth daily with breakfast. 10/06/16   Rama, Maryruth Bun, MD    Family History Family History  Problem Relation Age of Onset  . Hypertension Other     Social History Social History   Tobacco Use  . Smoking status: Current Every Day Smoker  . Smokeless tobacco: Never Used  Substance Use Topics  . Alcohol use: No  . Drug use: No     Allergies   Patient has no known allergies.   Review of Systems Review of Systems  All systems reviewed and negative, other than  as noted in HPI.  Physical Exam Updated Vital Signs Ht 5\' 5"  (1.651 m)   Wt 72.6 kg (160 lb)   BMI 26.63 kg/m   Physical Exam  Constitutional: She appears well-developed and well-nourished. No distress.  HENT:  Head: Normocephalic and atraumatic.  Eyes: Conjunctivae are normal. Right eye exhibits no discharge. Left eye exhibits no discharge.  Neck: Neck supple.  Cardiovascular: Regular rhythm and normal heart sounds. Exam reveals no gallop and no friction rub.  No murmur heard. Sinus tachycardia  Pulmonary/Chest: She is in respiratory distress.  Tachypnea. accessory muscle usage.  Expiratory wheezing bilaterally.  Abdominal: Soft. She exhibits no distension. There is no tenderness.    Musculoskeletal: She exhibits no edema or tenderness.  Neurological: She is alert.  Skin: Skin is warm and dry.  Psychiatric: She has a normal mood and affect. Her behavior is normal. Thought content normal.  Nursing note and vitals reviewed.    ED Treatments / Results  Labs (all labs ordered are listed, but only abnormal results are displayed) Labs Reviewed  CBC WITH DIFFERENTIAL/PLATELET - Abnormal; Notable for the following components:      Result Value   WBC 21.9 (*)    Platelets 514 (*)    Neutro Abs 17.5 (*)    Monocytes Absolute 1.3 (*)    All other components within normal limits  BASIC METABOLIC PANEL - Abnormal; Notable for the following components:   Sodium 134 (*)    Chloride 97 (*)    Glucose, Bld 276 (*)    Calcium 8.8 (*)    GFR calc non Af Amer 59 (*)    All other components within normal limits  CBG MONITORING, ED - Abnormal; Notable for the following components:   Glucose-Capillary 259 (*)    All other components within normal limits  I-STAT ARTERIAL BLOOD GAS, ED    EKG  EKG Interpretation None       Radiology Dg Chest Portable 1 View  Result Date: 09/26/2017 CLINICAL DATA:  Shortness of breath for the past 12 hours. EXAM: PORTABLE CHEST 1 VIEW COMPARISON:  Chest x-ray dated December 07, 2016. FINDINGS: The heart size and mediastinal contours are within normal limits. Both lungs are clear. The visualized skeletal structures are unremarkable. IMPRESSION: No active disease. Electronically Signed   By: Obie Dredge M.D.   On: 09/26/2017 14:59    Procedures Procedures (including critical care time)  CRITICAL CARE Performed by: Raeford Razor Total critical care time: 35 minutes Critical care time was exclusive of separately billable procedures and treating other patients. Critical care was necessary to treat or prevent imminent or life-threatening deterioration. Critical care was time spent personally by me on the following activities: development  of treatment plan with patient and/or surrogate as well as nursing, discussions with consultants, evaluation of patient's response to treatment, examination of patient, obtaining history from patient or surrogate, ordering and performing treatments and interventions, ordering and review of laboratory studies, ordering and review of radiographic studies, pulse oximetry and re-evaluation of patient's condition.  INTUBATION Performed by: Raeford Razor  Required items: required blood products, implants, devices, and special equipment available Patient identity confirmed: provided demographic data and hospital-assigned identification number Time out: Immediately prior to procedure a "time out" was called to verify the correct patient, procedure, equipment, support staff and site/side marked as required.  Indications: Airway protection Intubation method: Glidescope Laryngoscopy   Preoxygenation: BVM  Sedatives: Etomidate Paralytic: Succinylcholine  Tube Size: 7.5 cuffed  Post-procedure assessment: chest rise  and ETCO2 monitor Breath sounds: equal and absent over the epigastrium Tube secured with: ETT holder Chest x-ray interpreted by radiologist and me.  Chest x-ray findings: endotracheal tube in appropriate position  Patient tolerated the procedure well with no immediate complications.     Medications Ordered in ED Medications  albuterol (PROVENTIL,VENTOLIN) solution continuous neb (not administered)  ipratropium (ATROVENT) nebulizer solution 0.5 mg (not administered)     Initial Impression / Assessment and Plan / ED Course  I have reviewed the triage vital signs and the nursing notes.  Pertinent labs & imaging results that were available during my care of the patient were reviewed by me and considered in my medical decision making (see chart for details).  Clinical Course as of Sep 26 1657  Tue Sep 26, 2017  1606 Patient increasing work of breathing. She was given Ativan and  then too drowsy to protect her airway.  She was intubated.  [SK]  1655 Husband at bedside.  He reports that her breathing is there is been worsening over the past several days.  She was evaluated in urgent care on the 30th prescribed steroids.  She has been taking these as far as he is aware.  She continues to smoke.  [SK]    Clinical Course User Index [SK] Raeford Razor, MD    67 year old female with respiratory distress.  Clinically COPD exacerbation.  See Solu-Medrol prehospital.  She was started on BiPAP in a continuous neb treatment.  Despite BiPAP her work of breathing increased.  Ativan was tried because of how anxious she was but she became too drowsy to protect her airway.  She was intubated.  Chest x-ray does not show any acute abnormality.  Final Clinical Impressions(s) / ED Diagnoses   Final diagnoses:  COPD exacerbation (HCC)  Acute on chronic respiratory failure, unspecified whether with hypoxia or hypercapnia [facility])    ED Discharge Orders    None       Raeford Razor, MD 09/26/17 1659

## 2017-09-26 NOTE — ED Notes (Signed)
Respiratory called for transport, unable to transport at this time will be soon

## 2017-09-27 ENCOUNTER — Inpatient Hospital Stay (HOSPITAL_COMMUNITY): Payer: Medicare Other

## 2017-09-27 LAB — RESPIRATORY PANEL BY PCR
ADENOVIRUS-RVPPCR: NOT DETECTED
Bordetella pertussis: NOT DETECTED
CORONAVIRUS NL63-RVPPCR: NOT DETECTED
CORONAVIRUS OC43-RVPPCR: NOT DETECTED
Chlamydophila pneumoniae: NOT DETECTED
Coronavirus 229E: NOT DETECTED
Coronavirus HKU1: NOT DETECTED
INFLUENZA A H1 2009-RVPPR: DETECTED — AB
INFLUENZA B-RVPPCR: NOT DETECTED
MYCOPLASMA PNEUMONIAE-RVPPCR: NOT DETECTED
Metapneumovirus: NOT DETECTED
PARAINFLUENZA VIRUS 1-RVPPCR: NOT DETECTED
PARAINFLUENZA VIRUS 3-RVPPCR: NOT DETECTED
PARAINFLUENZA VIRUS 4-RVPPCR: NOT DETECTED
Parainfluenza Virus 2: NOT DETECTED
RESPIRATORY SYNCYTIAL VIRUS-RVPPCR: NOT DETECTED
RHINOVIRUS / ENTEROVIRUS - RVPPCR: NOT DETECTED

## 2017-09-27 LAB — POCT I-STAT 3, ART BLOOD GAS (G3+)
Acid-Base Excess: 1 mmol/L (ref 0.0–2.0)
Acid-Base Excess: 1 mmol/L (ref 0.0–2.0)
BICARBONATE: 29 mmol/L — AB (ref 20.0–28.0)
BICARBONATE: 28.9 mmol/L — AB (ref 20.0–28.0)
O2 Saturation: 99 %
O2 Saturation: 99 %
PO2 ART: 155 mmHg — AB (ref 83.0–108.0)
TCO2: 31 mmol/L (ref 22–32)
TCO2: 31 mmol/L (ref 22–32)
pCO2 arterial: 61.6 mmHg — ABNORMAL HIGH (ref 32.0–48.0)
pCO2 arterial: 61.3 mmHg — ABNORMAL HIGH (ref 32.0–48.0)
pH, Arterial: 7.28 — ABNORMAL LOW (ref 7.350–7.450)
pH, Arterial: 7.281 — ABNORMAL LOW (ref 7.350–7.450)
pO2, Arterial: 161 mmHg — ABNORMAL HIGH (ref 83.0–108.0)

## 2017-09-27 LAB — GLUCOSE, CAPILLARY
GLUCOSE-CAPILLARY: 152 mg/dL — AB (ref 65–99)
Glucose-Capillary: 183 mg/dL — ABNORMAL HIGH (ref 65–99)
Glucose-Capillary: 174 mg/dL — ABNORMAL HIGH (ref 65–99)
Glucose-Capillary: 186 mg/dL — ABNORMAL HIGH (ref 65–99)
Glucose-Capillary: 172 mg/dL — ABNORMAL HIGH (ref 65–99)

## 2017-09-27 LAB — CBC
HCT: 39.7 % (ref 36.0–46.0)
HEMOGLOBIN: 12.8 g/dL (ref 12.0–15.0)
MCH: 30.7 pg (ref 26.0–34.0)
MCHC: 32.2 g/dL (ref 30.0–36.0)
MCV: 95.2 fL (ref 78.0–100.0)
Platelets: 392 10*3/uL (ref 150–400)
RBC: 4.17 MIL/uL (ref 3.87–5.11)
RDW: 14.2 % (ref 11.5–15.5)
WBC: 16.7 10*3/uL — AB (ref 4.0–10.5)

## 2017-09-27 LAB — BASIC METABOLIC PANEL
ANION GAP: 9 (ref 5–15)
BUN: 23 mg/dL — AB (ref 6–20)
CHLORIDE: 102 mmol/L (ref 101–111)
CO2: 26 mmol/L (ref 22–32)
Calcium: 8.5 mg/dL — ABNORMAL LOW (ref 8.9–10.3)
Creatinine, Ser: 0.85 mg/dL (ref 0.44–1.00)
GFR calc Af Amer: >60 mL/min (ref >60)
Glucose, Bld: 155 mg/dL — ABNORMAL HIGH (ref 65–99)
Potassium: 4.2 mmol/L (ref 3.5–5.1)
SODIUM: 137 mmol/L (ref 135–145)

## 2017-09-27 LAB — MRSA PCR SCREENING: MRSA BY PCR: NEGATIVE

## 2017-09-27 LAB — MAGNESIUM
MAGNESIUM: 2.4 mg/dL (ref 1.7–2.4)
MAGNESIUM: 2.5 mg/dL — AB (ref 1.7–2.4)

## 2017-09-27 LAB — HIV ANTIBODY (ROUTINE TESTING W REFLEX): HIV Screen 4th Generation wRfx: NONREACTIVE

## 2017-09-27 LAB — PHOSPHORUS
PHOSPHORUS: 3.1 mg/dL (ref 2.5–4.6)
Phosphorus: 4.3 mg/dL (ref 2.5–4.6)

## 2017-09-27 MED ORDER — METHYLPREDNISOLONE SODIUM SUCC 125 MG IJ SOLR
60.0000 mg | Freq: Two times a day (BID) | INTRAMUSCULAR | Status: DC
  Administered 2017-09-27 – 2017-09-29 (×5): 60 mg via INTRAVENOUS
  Filled 2017-09-27 (×3): qty 0.96
  Filled 2017-09-27: qty 2
  Filled 2017-09-27: qty 0.96

## 2017-09-27 MED ORDER — CHLORHEXIDINE GLUCONATE 0.12% ORAL RINSE (MEDLINE KIT)
15.0000 mL | Freq: Two times a day (BID) | OROMUCOSAL | Status: DC
  Administered 2017-09-27 – 2017-10-06 (×19): 15 mL via OROMUCOSAL

## 2017-09-27 MED ORDER — FENTANYL CITRATE (PF) 100 MCG/2ML IJ SOLN
50.0000 ug | Freq: Once | INTRAMUSCULAR | Status: AC
  Administered 2017-09-27: 50 ug via INTRAVENOUS

## 2017-09-27 MED ORDER — FENTANYL BOLUS VIA INFUSION
25.0000 ug | INTRAVENOUS | Status: DC | PRN
  Administered 2017-09-28 – 2017-09-29 (×3): 25 ug via INTRAVENOUS
  Filled 2017-09-27: qty 25

## 2017-09-27 MED ORDER — MIDAZOLAM HCL 2 MG/2ML IJ SOLN
2.0000 mg | Freq: Once | INTRAMUSCULAR | Status: AC
  Administered 2017-09-27: 2 mg via INTRAVENOUS

## 2017-09-27 MED ORDER — OSELTAMIVIR PHOSPHATE 75 MG PO CAPS
75.0000 mg | ORAL_CAPSULE | Freq: Two times a day (BID) | ORAL | Status: AC
  Administered 2017-09-27 – 2017-10-01 (×10): 75 mg via ORAL
  Filled 2017-09-27 (×10): qty 1

## 2017-09-27 MED ORDER — VITAL HIGH PROTEIN PO LIQD
1000.0000 mL | ORAL | Status: DC
  Administered 2017-09-27 – 2017-10-02 (×4): 1000 mL
  Filled 2017-09-27 (×4): qty 1000

## 2017-09-27 MED ORDER — INSULIN ASPART 100 UNIT/ML ~~LOC~~ SOLN
0.0000 [IU] | SUBCUTANEOUS | Status: DC
  Administered 2017-09-27 – 2017-09-28 (×6): 3 [IU] via SUBCUTANEOUS
  Administered 2017-09-28: 5 [IU] via SUBCUTANEOUS
  Administered 2017-09-28 – 2017-09-29 (×3): 3 [IU] via SUBCUTANEOUS
  Administered 2017-09-29: 8 [IU] via SUBCUTANEOUS
  Administered 2017-09-29 (×3): 3 [IU] via SUBCUTANEOUS
  Administered 2017-09-30: 5 [IU] via SUBCUTANEOUS
  Administered 2017-09-30 – 2017-10-01 (×6): 3 [IU] via SUBCUTANEOUS
  Administered 2017-10-01 (×2): 5 [IU] via SUBCUTANEOUS
  Administered 2017-10-01 (×2): 3 [IU] via SUBCUTANEOUS
  Administered 2017-10-01 – 2017-10-02 (×3): 5 [IU] via SUBCUTANEOUS
  Administered 2017-10-02 (×2): 3 [IU] via SUBCUTANEOUS
  Administered 2017-10-02 – 2017-10-03 (×3): 2 [IU] via SUBCUTANEOUS
  Administered 2017-10-03: 3 [IU] via SUBCUTANEOUS
  Administered 2017-10-03: 5 [IU] via SUBCUTANEOUS
  Administered 2017-10-03: 2 [IU] via SUBCUTANEOUS
  Administered 2017-10-03 – 2017-10-04 (×6): 3 [IU] via SUBCUTANEOUS
  Administered 2017-10-04: 5 [IU] via SUBCUTANEOUS
  Administered 2017-10-05 (×4): 3 [IU] via SUBCUTANEOUS
  Administered 2017-10-05: 2 [IU] via SUBCUTANEOUS
  Administered 2017-10-06: 3 [IU] via SUBCUTANEOUS
  Administered 2017-10-06 (×2): 2 [IU] via SUBCUTANEOUS
  Administered 2017-10-06 (×2): 3 [IU] via SUBCUTANEOUS
  Administered 2017-10-07: 2 [IU] via SUBCUTANEOUS
  Administered 2017-10-07 (×5): 3 [IU] via SUBCUTANEOUS
  Administered 2017-10-08: 2 [IU] via SUBCUTANEOUS
  Administered 2017-10-08 (×3): 3 [IU] via SUBCUTANEOUS
  Administered 2017-10-08: 2 [IU] via SUBCUTANEOUS
  Administered 2017-10-08 – 2017-10-09 (×2): 3 [IU] via SUBCUTANEOUS
  Administered 2017-10-09 – 2017-10-10 (×2): 2 [IU] via SUBCUTANEOUS
  Administered 2017-10-10: 1 [IU] via SUBCUTANEOUS
  Administered 2017-10-10 (×2): 2 [IU] via SUBCUTANEOUS
  Administered 2017-10-10: 1 [IU] via SUBCUTANEOUS
  Administered 2017-10-11 (×3): 2 [IU] via SUBCUTANEOUS
  Administered 2017-10-12: 3 [IU] via SUBCUTANEOUS
  Administered 2017-10-12 – 2017-10-13 (×4): 2 [IU] via SUBCUTANEOUS
  Administered 2017-10-13: 3 [IU] via SUBCUTANEOUS
  Administered 2017-10-13: 2 [IU] via SUBCUTANEOUS
  Administered 2017-10-14: 3 [IU] via SUBCUTANEOUS
  Administered 2017-10-15 – 2017-10-16 (×3): 2 [IU] via SUBCUTANEOUS
  Administered 2017-10-16: 3 [IU] via SUBCUTANEOUS
  Administered 2017-10-17 – 2017-10-20 (×7): 2 [IU] via SUBCUTANEOUS
  Administered 2017-10-22: 3 [IU] via SUBCUTANEOUS
  Administered 2017-10-23 – 2017-10-24 (×3): 2 [IU] via SUBCUTANEOUS
  Administered 2017-10-24: 3 [IU] via SUBCUTANEOUS
  Administered 2017-10-24 – 2017-10-25 (×3): 2 [IU] via SUBCUTANEOUS

## 2017-09-27 MED ORDER — FENTANYL 2500MCG IN NS 250ML (10MCG/ML) PREMIX INFUSION
0.0000 ug/h | INTRAVENOUS | Status: DC
  Administered 2017-09-27: 50 ug/h via INTRAVENOUS
  Administered 2017-09-28 – 2017-09-30 (×4): 200 ug/h via INTRAVENOUS
  Administered 2017-10-01: 50 ug/h via INTRAVENOUS
  Administered 2017-10-02: 0 ug/h via INTRAVENOUS
  Filled 2017-09-27 (×6): qty 250

## 2017-09-27 MED ORDER — LORAZEPAM 2 MG/ML IJ SOLN
INTRAMUSCULAR | Status: AC
  Filled 2017-09-27: qty 1

## 2017-09-27 MED ORDER — PRO-STAT SUGAR FREE PO LIQD
30.0000 mL | Freq: Three times a day (TID) | ORAL | Status: DC
Start: 2017-09-27 — End: 2017-10-03
  Administered 2017-09-27 – 2017-10-03 (×18): 30 mL
  Filled 2017-09-27 (×18): qty 30

## 2017-09-27 MED ORDER — DILTIAZEM HCL 60 MG PO TABS
75.0000 mg | ORAL_TABLET | Freq: Three times a day (TID) | ORAL | Status: DC
  Administered 2017-09-27 – 2017-10-01 (×12): 75 mg via ORAL
  Filled 2017-09-27 (×12): qty 0.5

## 2017-09-27 MED ORDER — SODIUM CHLORIDE 0.9 % IV SOLN
25.0000 ug/h | INTRAVENOUS | Status: DC
  Filled 2017-09-27: qty 50

## 2017-09-27 MED ORDER — ORAL CARE MOUTH RINSE
15.0000 mL | Freq: Four times a day (QID) | OROMUCOSAL | Status: DC
  Administered 2017-09-27 – 2017-10-06 (×38): 15 mL via OROMUCOSAL

## 2017-09-27 NOTE — Progress Notes (Signed)
Inpatient Diabetes Program Recommendations  AACE/ADA: New Consensus Statement on Inpatient Glycemic Control (2015)  Target Ranges:  Prepandial:   less than 140 mg/dL      Peak postprandial:   less than 180 mg/dL (1-2 hours)      Critically ill patients:  140 - 180 mg/dL   Lab Results  Component Value Date   GLUCAP 174 (H) 09/27/2017    Review of Glycemic Control Results for Sally, [name] (MRN [id number]) as of 09/27/2017 11:52  Ref. Range 09/26/2017 23:30 09/27/2017 03:54 09/27/2017 07:28 09/27/2017 11:48  Glucose-Capillary Latest Ref Range: 65 - 99 mg/dL 401 (H) 027 (H) 253 (H) 174 (H)   Diabetes history: None Outpatient Diabetes medications: None Current orders for Inpatient glycemic control: None  Inpatient Diabetes Program Recommendations:    Noted initiation of Solumedrol 60mg  Q12H and tube feeds. Would expect blood sugars to increase throughout the day. Already checking sugars Q4H, therefore, if blood sugars exceed 250mg /dL may initiate the ICU glycemic control order set.   Thanks, Lujean Rave, MSN, RNC-OB Diabetes Coordinator [phone] (8a-5p)

## 2017-09-27 NOTE — Progress Notes (Signed)
RT changed pts ETT holder with help of another RT. Will continue to monitor.

## 2017-09-27 NOTE — Progress Notes (Signed)
PULMONARY / CRITICAL CARE MEDICINE   Name: Sally [name] MRN: [id number] DOB: 10-Oct-1950    ADMISSION DATE:  09/26/2017 CONSULTATION DATE:  09/26/2017  REFERRING MD:  Juleen China  CHIEF COMPLAINT:  dyspnea  HISTORY OF PRESENT ILLNESS:   67 year old female with a past medical history significant for severe COPD came to the emergency department on September 26, 2017 complaining of shortness of breath.  Her husband provides the history because she was intubated on my arrival.  He says that she continues to smoke cigarettes, greater than 1 pack/day.  She uses albuterol about 3 times a day but does not take any other inhalers.  She does not use oxygen.  About a week ago they had a cough or cold and were prescribed a Z-Pak.  She said that her symptoms persisted.  He denied fevers or chills.  She came to the emergency department and was initially treated with BiPAP but she developed worsening respiratory failure and required intubation.    PAST MEDICAL HISTORY :  She  has a past medical history of Asthma, Back pain, COPD (chronic obstructive pulmonary disease) (HCC), Crohn disease (HCC), and Hypertension.  PAST SURGICAL HISTORY: She  has a past surgical history that includes Abdominal hysterectomy; Fracture surgery (Left); and Cataract extraction, bilateral.  No Known Allergies  No current facility-administered medications on file prior to encounter.    Current Outpatient Medications on File Prior to Encounter  Medication Sig  . albuterol (PROVENTIL HFA;VENTOLIN HFA) 108 (90 Base) MCG/ACT inhaler Inhale 1-2 puffs into the lungs every 6 (six) hours as needed for wheezing or shortness of breath.  Marland Kitchen albuterol (PROVENTIL) (2.5 MG/3ML) 0.083% nebulizer solution Take 2.5 mg by nebulization every 6 (six) hours as needed for wheezing or shortness of breath.   Ailene Ards ELLIPTA 62.5-25 MCG/INH AEPB Inhale 1 puff into the lungs daily.   Marland Kitchen aspirin EC 81 MG tablet Take 81 mg by mouth daily.  Marland Kitchen atorvastatin (LIPITOR)  20 MG tablet Take 20 mg by mouth at bedtime.  Marland Kitchen DILT-XR 240 MG 24 hr capsule Take 240 mg by mouth daily.  . DULoxetine (CYMBALTA) 30 MG capsule Take 1 capsule (30 mg total) by mouth daily.  Marland Kitchen FLOVENT HFA 110 MCG/ACT inhaler Inhale 2 puffs into the lungs every 12 (twelve) hours.   . hydrOXYzine (VISTARIL) 25 MG capsule Take 1 capsule (25 mg total) by mouth 3 (three) times daily as needed for anxiety.  . predniSONE (DELTASONE) 20 MG tablet Take 2 tablets (40 mg total) by mouth daily with breakfast.    FAMILY HISTORY:  Her indicated that the status of her other is unknown.   SOCIAL HISTORY: She  reports that she has been smoking.  she has never used smokeless tobacco. She reports that she does not drink alcohol or use drugs.  REVIEW OF SYSTEMS:   Pt intubated  SUBJECTIVE:  Some vent dysynchrony this am, afebrile, tachycardia improving, increased wheezing overnight   VITAL SIGNS: BP 122/72   Pulse 95   Temp 98.5 F (36.9 C) (Oral)   Resp (!) 24   Ht 5\' 1"  (1.549 m)   Wt 169 lb 15.6 oz (77.1 kg)   SpO2 96%   BMI 32.12 kg/m   HEMODYNAMICS:    VENTILATOR SETTINGS: Vent Mode: PRVC FiO2 (%):  [40 %-100 %] 40 % Set Rate:  [18 bmp-24 bmp] 24 bmp Vt Set:  [380 mL-450 mL] 450 mL PEEP:  [5 cmH20] 5 cmH20 Plateau Pressure:  [23 cmH20-25 cmH20] 23 cmH20  INTAKE / OUTPUT: I/O last 3 completed shifts: In: 77 [I.V.:54] Out: -   PHYSICAL EXAMINATION: General:  Sedated, looks slightly uncomfortable Neuro:  Sedated, on vent HENT:  ETT in place PULM: continued Wheezing diffusely, poor air movement, vent supported breathing CV: RRR, no murmurs, clear S1, S2 GI: BS+, soft, nontender MSK: normal bulk and tone    LABS:  BMET Recent Labs  Lab 09/26/17 1446 09/26/17 2109  NA 134*  --   K 4.4  --   CL 97*  --   CO2 24  --   BUN 18  --   CREATININE 0.98 1.09*  GLUCOSE 276*  --     Electrolytes Recent Labs  Lab 09/26/17 1446 09/26/17 2109  CALCIUM 8.8*  --   MG  --   2.4  PHOS  --  4.6    CBC Recent Labs  Lab 09/26/17 1446 09/26/17 2109  WBC 21.9* 16.7*  HGB 13.9 13.5  HCT 41.9 40.8  PLT 514* 412*    Coag's No results for input(s): APTT, INR in the last 168 hours.  Sepsis Markers No results for input(s): LATICACIDVEN, PROCALCITON, O2SATVEN in the last 168 hours.  ABG Recent Labs  Lab 09/26/17 1654  PHART 7.278*  PCO2ART 62.3*  PO2ART 590.0*    Liver Enzymes No results for input(s): AST, ALT, ALKPHOS, BILITOT, ALBUMIN in the last 168 hours.  Cardiac Enzymes No results for input(s): TROPONINI, PROBNP in the last 168 hours.  Glucose Recent Labs  Lab 09/26/17 1452 09/26/17 2330 09/27/17 0354  GLUCAP 259* 141* 183*    Imaging Dg Chest Port 1 View  Result Date: 09/27/2017 CLINICAL DATA:  Initial evaluation for COPD exacerbation. EXAM: PORTABLE CHEST 1 VIEW COMPARISON:  Prior radiograph from 09/26/2017. FINDINGS: Endotracheal tube in place with tip positioned well above the carina. Enteric tube courses in the abdomen. Cardiac and mediastinal silhouettes are stable, and remain within normal limits. Aortic atherosclerosis. Lungs are normally inflated. Changes related COPD. No focal infiltrates. No pulmonary edema or pleural effusion. No pneumothorax. Osseous structures unchanged. IMPRESSION: 1. Support apparatus in satisfactory position. 2. COPD.  No new acute cardiopulmonary abnormality. Electronically Signed   By: Rise Mu M.D.   On: 09/27/2017 06:52   Dg Chest Portable 1 View  Result Date: 09/26/2017 CLINICAL DATA:  Respiratory failure.  Check ETT position. EXAM: PORTABLE CHEST 1 VIEW COMPARISON:  None. FINDINGS: The heart size and mediastinal contours are within normal limits. The tip of an endotracheal tube is 5.5 cm above the carina, in satisfactory position. A gastric tube with tip and side-port side port project within the expected location the stomach. Intramedullary nail fixation of the left humerus. IMPRESSION: 1.  Satisfactory support line and tube positions. 2. Pulmonary hyperinflation. Electronically Signed   By: Tollie Eth M.D.   On: 09/26/2017 16:16   Dg Chest Portable 1 View  Result Date: 09/26/2017 CLINICAL DATA:  Shortness of breath for the past 12 hours. EXAM: PORTABLE CHEST 1 VIEW COMPARISON:  Chest x-ray dated December 07, 2016. FINDINGS: The heart size and mediastinal contours are within normal limits. Both lungs are clear. The visualized skeletal structures are unremarkable. IMPRESSION: No active disease. Electronically Signed   By: Obie Dredge M.D.   On: 09/26/2017 14:59         STUDIES:  Chest x-ray shows endotracheal tube in place, emphysema   CULTURES: September 26, 2017 respiratory culture September 26, 2017 respiratory viral panel  ANTIBIOTICS: Levaquin   SIGNIFICANT EVENTS:  LINES/TUBES: January 1 endotracheal tube   DISCUSSION: 67 year old female with a past medical history significant for severe COPD admitted for a COPD exacerbation causing acute respiratory failure with hypoxemia necessitating mechanical ventilation.   ASSESSMENT / PLAN:   PULMONARY A: Acute respiratory failure with hypoxemia COPD exacerbation P:   Solu-Medrol 60mg  twice daily DuoNeb every 6 hours Full mechanical vent support VAP prevention Daily WUA/SBT Will recheck ABG today     CARDIOVASCULAR A:  Sinus tach P:  Telemetry monitoring On diltiazem for HTN only no afib history   RENAL A:   No acute issues P:   Monitor BMET and UOP Replace electrolytes as needed     GASTROINTESTINAL A:   GERD P:   Pepcid Tube feeding per protocol   HEMATOLOGIC A:   No acute issues P:  Monitor for bleeding   INFECTIOUS A:   COPD exacerbation P:   Respiratory viral panel pending Continue Levaquin   ENDOCRINE A:   No acute issues P:   Monitor glucose   NEUROLOGIC A:   Acute encephalopathy secondary to intubation/hypercarbic respiratory failure P:   RASS goal: 0-  -1 Propofol infusion, switched to fentanyl gtt due to pt discomfort,  as needed Versed per PAD protocol     FAMILY  - Updates: husband updated yesterday at bedside   - Inter-disciplinary family meet or Palliative Care meeting due by:  day 7     Pulmonary and Critical Care Medicine [facility] Pager: [phone]  09/27/2017, 7:10 AM

## 2017-09-27 NOTE — Progress Notes (Signed)
Attending:  I have seen and examined the patient with nurse practitioner/resident and agree with the note above.  We formulated the plan together and I elicited the following history.    Subjective: Some vent dyssynchrony overnight Not following commands  Objective: Vitals:   09/27/17 0600 09/27/17 0737 09/27/17 0812 09/27/17 0913  BP: 123/68 (!) 150/75    Pulse: 92 (!) 109    Resp: (!) 24 (!) 27    Temp:   98.5 F (36.9 C)   TempSrc:   Oral   SpO2: 96% 95%  96%  Weight:      Height:       Vent Mode: PCV FiO2 (%):  [40 %-100 %] 40 % Set Rate:  [18 bmp-24 bmp] 24 bmp Vt Set:  [380 mL-450 mL] 450 mL PEEP:  [5 cmH20] 5 cmH20 Plateau Pressure:  [23 cmH20-26 cmH20] 26 cmH20  Intake/Output Summary (Last 24 hours) at 09/27/2017 0933 Last data filed at 09/26/2017 2200 Gross per 24 hour  Intake 54.01 ml  Output -  Net 54.01 ml    General:  In bed on vent HENT: NCAT ETT in place PULM: Wheezing B, vent supported breathing CV: RRR, no mgr GI: BS+, soft, nontender MSK: normal bulk and tone Neuro: sedated on vent     CBC    Component Value Date/Time   WBC 16.7 (H) 09/26/2017 2109   RBC 4.37 09/26/2017 2109   HGB 13.5 09/26/2017 2109   HCT 40.8 09/26/2017 2109   PLT 412 (H) 09/26/2017 2109   MCV 93.4 09/26/2017 2109   MCH 30.9 09/26/2017 2109   MCHC 33.1 09/26/2017 2109   RDW 14.0 09/26/2017 2109   LYMPHSABS 3.1 09/26/2017 1446   MONOABS 1.3 (H) 09/26/2017 1446   EOSABS 0.0 09/26/2017 1446   BASOSABS 0.0 09/26/2017 1446    BMET    Component Value Date/Time   NA 134 (L) 09/26/2017 1446   K 4.4 09/26/2017 1446   CL 97 (L) 09/26/2017 1446   CO2 24 09/26/2017 1446   GLUCOSE 276 (H) 09/26/2017 1446   BUN 18 09/26/2017 1446   CREATININE 1.09 (H) 09/26/2017 2109   CALCIUM 8.8 (L) 09/26/2017 1446   GFRNONAA 52 (L) 09/26/2017 2109   GFRAA >60 09/26/2017 2109   RSV pending  CXR images reviewed: emphysema, support apparatus in place, no  infiltrate  Impression/Plan:  Acute respiratory failure with hypoxemia: continue full vent support, daily SBT/WUA COPD exacerbation: solumedrol, continue duoneb, add higher sedation, change to pressure control Hypertension: continuing dilt for now Renal as above F/U RSV panel   My cc time 31 minutes  Heber Atwood, MD [place] [facility] Pager: [phone] Cell: [phone] After 3pm or if no response, call [phone]

## 2017-09-27 NOTE — Progress Notes (Signed)
Initial Nutrition Assessment  DOCUMENTATION CODES:   Not applicable  INTERVENTION:    Vital High Protein at 25 ml/h (600 ml per day)  Pro-stat 30 ml TID  Provides 900 kcal (1214 kcal total with TF + Propofol), 98 gm protein, 502 ml free water daily  NUTRITION DIAGNOSIS:   Inadequate oral intake related to inability to eat as evidenced by NPO status.  GOAL:   Provide needs based on ASPEN/SCCM guidelines  MONITOR:   Vent status, TF tolerance, Labs, I & O's  REASON FOR ASSESSMENT:   Ventilator, Consult Enteral/tube feeding initiation and management  ASSESSMENT:   67 yo female with PMH of COPD, HTN, Crohn's DZ, asthma who was admitted on 1/1 with COPD exacerbation, requiring intubation.  Discussed patient in ICU rounds and with RN today. Received MD Consult for TF initiation and management. Patient is currently intubated on ventilator support MV: 7.9 L/min Temp (24hrs), Avg:98.6 F (37 C), Min:98.2 F (36.8 C), Max:98.9 F (37.2 C)  Propofol: 10.9 ml/hr providing 314 kcal from lipid. Labs reviewed. CBG's: [phone] Medications reviewed and include Propofol.  NUTRITION - FOCUSED PHYSICAL EXAM:    Most Recent Value  Orbital Region  No depletion  Upper Arm Region  No depletion  Thoracic and Lumbar Region  Unable to assess  Buccal Region  No depletion  Temple Region  No depletion  Clavicle Bone Region  No depletion  Clavicle and Acromion Bone Region  No depletion  Scapular Bone Region  Unable to assess  Dorsal Hand  Unable to assess  Patellar Region  No depletion  Anterior Thigh Region  No depletion  Posterior Calf Region  No depletion  Edema (RD Assessment)  None  Hair  Reviewed  Eyes  Unable to assess  Mouth  Unable to assess  Skin  Reviewed  Nails  Unable to assess       Diet Order:  Diet NPO time specified  EDUCATION NEEDS:   No education needs have been identified at this time  Skin:  Skin Assessment: Reviewed RN Assessment  Last BM:   PTA  Height:   Ht Readings from Last 1 Encounters:  09/26/17 5\' 1"  (1.549 m)    Weight:   Wt Readings from Last 1 Encounters:  09/27/17 169 lb 15.6 oz (77.1 kg)    Ideal Body Weight:  47.7 kg  BMI:  Body mass index is 32.12 kg/m.  Estimated Nutritional Needs:   Kcal:  [phone]  Protein:  95 gm  Fluid:  1.2-1.4 L   Joaquin Courts, RD, LDN, CNSC Pager [phone] After Hours Pager [phone]

## 2017-09-28 ENCOUNTER — Inpatient Hospital Stay (HOSPITAL_COMMUNITY): Payer: Medicare Other

## 2017-09-28 ENCOUNTER — Other Ambulatory Visit: Payer: Self-pay

## 2017-09-28 DIAGNOSIS — J9620 Acute and chronic respiratory failure, unspecified whether with hypoxia or hypercapnia: Secondary | ICD-10-CM

## 2017-09-28 LAB — GLUCOSE, CAPILLARY
GLUCOSE-CAPILLARY: 172 mg/dL — AB (ref 65–99)
GLUCOSE-CAPILLARY: 182 mg/dL — AB (ref 65–99)
Glucose-Capillary: 174 mg/dL — ABNORMAL HIGH (ref 65–99)
Glucose-Capillary: 172 mg/dL — ABNORMAL HIGH (ref 65–99)
Glucose-Capillary: 170 mg/dL — ABNORMAL HIGH (ref 65–99)
Glucose-Capillary: 166 mg/dL — ABNORMAL HIGH (ref 65–99)
Glucose-Capillary: 171 mg/dL — ABNORMAL HIGH (ref 65–99)

## 2017-09-28 LAB — CBC
HEMATOCRIT: 38.3 % (ref 36.0–46.0)
HEMOGLOBIN: 12 g/dL (ref 12.0–15.0)
MCH: 30 pg (ref 26.0–34.0)
MCHC: 31.3 g/dL (ref 30.0–36.0)
MCV: 95.8 fL (ref 78.0–100.0)
Platelets: 353 10*3/uL (ref 150–400)
RBC: 4 MIL/uL (ref 3.87–5.11)
RDW: 14 % (ref 11.5–15.5)
WBC: 14.7 10*3/uL — ABNORMAL HIGH (ref 4.0–10.5)

## 2017-09-28 LAB — POCT I-STAT 3, ART BLOOD GAS (G3+)
ACID-BASE EXCESS: 3 mmol/L — AB (ref 0.0–2.0)
Bicarbonate: 32.2 mmol/L — ABNORMAL HIGH (ref 20.0–28.0)
O2 SAT: 98 %
TCO2: 34 mmol/L — ABNORMAL HIGH (ref 22–32)
pCO2 arterial: 70.7 mmHg (ref 32.0–48.0)
pH, Arterial: 7.264 — ABNORMAL LOW (ref 7.350–7.450)
pO2, Arterial: 125 mmHg — ABNORMAL HIGH (ref 83.0–108.0)

## 2017-09-28 LAB — BASIC METABOLIC PANEL
ANION GAP: 10 (ref 5–15)
BUN: 42 mg/dL — ABNORMAL HIGH (ref 6–20)
CALCIUM: 8.8 mg/dL — AB (ref 8.9–10.3)
CO2: 26 mmol/L (ref 22–32)
CREATININE: 0.9 mg/dL (ref 0.44–1.00)
Chloride: 104 mmol/L (ref 101–111)
GLUCOSE: 168 mg/dL — AB (ref 65–99)
Potassium: 4.4 mmol/L (ref 3.5–5.1)
Sodium: 140 mmol/L (ref 135–145)

## 2017-09-28 LAB — MAGNESIUM: Magnesium: 2.6 mg/dL — ABNORMAL HIGH (ref 1.7–2.4)

## 2017-09-28 LAB — TROPONIN I
TROPONIN I: 0.08 ng/mL — AB (ref <0.03)
Troponin I: 0.15 ng/mL (ref <0.03)
Troponin I: 0.11 ng/mL (ref <0.03)

## 2017-09-28 LAB — PHOSPHORUS: Phosphorus: 3.2 mg/dL (ref 2.5–4.6)

## 2017-09-28 MED ORDER — HYDRALAZINE HCL 20 MG/ML IJ SOLN
5.0000 mg | Freq: Four times a day (QID) | INTRAMUSCULAR | Status: DC | PRN
  Administered 2017-09-30 (×2): 5 mg via INTRAVENOUS
  Filled 2017-09-28 (×2): qty 1

## 2017-09-28 MED ORDER — ASPIRIN EC 81 MG PO TBEC
81.0000 mg | DELAYED_RELEASE_TABLET | Freq: Every day | ORAL | Status: DC
  Administered 2017-09-28: 81 mg via ORAL
  Filled 2017-09-28 (×2): qty 1

## 2017-09-28 MED ORDER — MIDAZOLAM HCL 2 MG/2ML IJ SOLN
4.0000 mg | Freq: Once | INTRAMUSCULAR | Status: AC
  Administered 2017-09-28: 4 mg via INTRAVENOUS
  Filled 2017-09-28: qty 4

## 2017-09-28 MED ORDER — ARFORMOTEROL TARTRATE 15 MCG/2ML IN NEBU
15.0000 ug | INHALATION_SOLUTION | Freq: Two times a day (BID) | RESPIRATORY_TRACT | Status: DC
  Administered 2017-09-28 – 2017-10-27 (×58): 15 ug via RESPIRATORY_TRACT
  Filled 2017-09-28 (×63): qty 2

## 2017-09-28 MED ORDER — BUDESONIDE 0.5 MG/2ML IN SUSP
0.5000 mg | Freq: Two times a day (BID) | RESPIRATORY_TRACT | Status: DC
  Administered 2017-09-28 – 2017-10-27 (×59): 0.5 mg via RESPIRATORY_TRACT
  Filled 2017-09-28 (×63): qty 2

## 2017-09-28 MED ORDER — CLONAZEPAM 1 MG PO TABS
1.0000 mg | ORAL_TABLET | Freq: Two times a day (BID) | ORAL | Status: DC
  Administered 2017-09-28 – 2017-10-03 (×11): 1 mg via ORAL
  Filled 2017-09-28 (×11): qty 1

## 2017-09-28 MED ORDER — MIDAZOLAM HCL 2 MG/2ML IJ SOLN
2.0000 mg | INTRAMUSCULAR | Status: DC | PRN
  Administered 2017-10-01 (×3): 2 mg via INTRAVENOUS
  Filled 2017-09-28 (×3): qty 2

## 2017-09-28 NOTE — Progress Notes (Signed)
CRITICAL VALUE ALERT  Critical Value:  Troponin 0.15  Date & Time Notied: 09/28/2017 0725  Provider Notified: B.Winfrey MD   Orders Received/Actions taken: MD notified face to face

## 2017-09-28 NOTE — Progress Notes (Signed)
MD notified of panic results on ABG PCO2 70.7. Reported to Dr Kendrick Fries. No changes a this time, patient is looking more comfortable on vent with previous changes. Will cont to monitor

## 2017-09-28 NOTE — Progress Notes (Signed)
eLink Physician-Brief Progress Note Patient Name: Sally [name] DOB: 1951/02/08 MRN: [id number]   Date of Service  09/28/2017  HPI/Events of Note  New flipped Twaves on leads II, III  eICU Interventions  Check trop     Intervention Category Intermediate Interventions: Diagnostic test evaluation  Kyng Matlock V. Majour Frei 09/28/2017, 1:26 AM

## 2017-09-28 NOTE — Progress Notes (Signed)
PULMONARY / CRITICAL CARE MEDICINE   Name: Sally [name] MRN: [id number] DOB: 1951-06-05    ADMISSION DATE:  09/26/2017 CONSULTATION DATE:  09/26/2017  REFERRING MD:  Juleen China  CHIEF COMPLAINT:  dyspnea  HISTORY OF PRESENT ILLNESS:   68 year old female with a past medical history significant for severe COPD came to the emergency department on September 26, 2017 complaining of shortness of breath.  Her husband provides the history because she was intubated on my arrival.  He says that she continues to smoke cigarettes, greater than 1 pack/day.  She uses albuterol about 3 times a day but does not take any other inhalers.  She does not use oxygen.  About a week ago they had a cough or cold and were prescribed a Z-Pak.  She said that her symptoms persisted.  He denied fevers or chills.  She came to the emergency department and was initially treated with BiPAP but she developed worsening respiratory failure and required intubation. Respiratory panel returned influenza A positive   PAST MEDICAL HISTORY :  She  has a past medical history of Asthma, Back pain, COPD (chronic obstructive pulmonary disease) (HCC), Crohn disease (HCC), and Hypertension.  PAST SURGICAL HISTORY: She  has a past surgical history that includes Abdominal hysterectomy; Fracture surgery (Left); and Cataract extraction, bilateral.  No Known Allergies  No current facility-administered medications on file prior to encounter.    Current Outpatient Medications on File Prior to Encounter  Medication Sig  . albuterol (PROVENTIL) (2.5 MG/3ML) 0.083% nebulizer solution Take 2.5 mg by nebulization every 6 (six) hours as needed for wheezing or shortness of breath.   . cefdinir (OMNICEF) 300 MG capsule Take 300 mg by mouth 2 (two) times daily.  . methylPREDNISolone (MEDROL DOSEPAK) 4 MG TBPK tablet Take by mouth.  Marland Kitchen albuterol (PROVENTIL HFA;VENTOLIN HFA) 108 (90 Base) MCG/ACT inhaler Inhale 1-2 puffs into the lungs every 6 (six) hours as  needed for wheezing or shortness of breath.  Ailene Ards ELLIPTA 62.5-25 MCG/INH AEPB Inhale 1 puff into the lungs daily.   Marland Kitchen aspirin EC 81 MG tablet Take 81 mg by mouth daily.  Marland Kitchen atorvastatin (LIPITOR) 20 MG tablet Take 20 mg by mouth at bedtime.  Marland Kitchen DILT-XR 240 MG 24 hr capsule Take 240 mg by mouth daily.  . DULoxetine (CYMBALTA) 30 MG capsule Take 1 capsule (30 mg total) by mouth daily.  Marland Kitchen FLOVENT HFA 110 MCG/ACT inhaler Inhale 2 puffs into the lungs every 12 (twelve) hours.   . hydrOXYzine (VISTARIL) 25 MG capsule Take 1 capsule (25 mg total) by mouth 3 (three) times daily as needed for anxiety.  . predniSONE (DELTASONE) 20 MG tablet Take 2 tablets (40 mg total) by mouth daily with breakfast.    FAMILY HISTORY:  Her indicated that the status of her other is unknown.   SOCIAL HISTORY: She  reports that she has been smoking.  she has never used smokeless tobacco. She reports that she does not drink alcohol or use drugs.  REVIEW OF SYSTEMS:   Pt intubated  SUBJECTIVE:  Wheezing improved, pt looks more comfortable today, some concern for EKG changes overnight, elevation in troponin   VITAL SIGNS: BP 122/70   Pulse 70   Temp 98.8 F (37.1 C) (Oral)   Resp (!) 9   Ht 5\' 1"  (1.549 m)   Wt 170 lb 10.2 oz (77.4 kg)   SpO2 95%   BMI 32.24 kg/m   HEMODYNAMICS:    VENTILATOR SETTINGS: Vent Mode: PCV FiO2 (%):  [  40 %] 40 % Set Rate:  [16 bmp-24 bmp] 16 bmp PEEP:  [5 cmH20] 5 cmH20 Plateau Pressure:  [18 cmH20-21 cmH20] 18 cmH20  INTAKE / OUTPUT: I/O last 3 completed shifts: In: 2277.4 [I.V.:812.1; NG/GT:1365.3; IV Piggyback:100] Out: 1225 [Urine:1225]  PHYSICAL EXAMINATION: General:  Sedated, comfortable Neuro:  Sedated, on vent HENT:  ETT in place PULM: Lungs clear today, still belly breathing, vent supported breathing CV: RRR, no murmurs, clear S1, S2 GI: BS+, soft, nontender, slightly distended MSK: normal bulk and tone    LABS:  BMET Recent Labs  Lab  09/26/17 1446 09/26/17 2109 09/27/17 0949  NA 134*  --  137  K 4.4  --  4.2  CL 97*  --  102  CO2 24  --  26  BUN 18  --  23*  CREATININE 0.98 1.09* 0.85  GLUCOSE 276*  --  155*    Electrolytes Recent Labs  Lab 09/26/17 1446  09/27/17 0949 09/27/17 1642 09/28/17 0542  CALCIUM 8.8*  --  8.5*  --   --   MG  --    < > 2.4 2.5* 2.6*  PHOS  --    < > 4.3 3.1 3.2   < > = values in this interval not displayed.    CBC Recent Labs  Lab 09/26/17 1446 09/26/17 2109 09/27/17 0949  WBC 21.9* 16.7* 16.7*  HGB 13.9 13.5 12.8  HCT 41.9 40.8 39.7  PLT 514* 412* 392    Coag's No results for input(s): APTT, INR in the last 168 hours.  Sepsis Markers No results for input(s): LATICACIDVEN, PROCALCITON, O2SATVEN in the last 168 hours.  ABG Recent Labs  Lab 09/26/17 1654 09/27/17 1050 09/27/17 1453  PHART 7.278* 7.280* 7.281*  PCO2ART 62.3* 61.6* 61.3*  PO2ART 590.0* 155.0* 161.0*    Liver Enzymes No results for input(s): AST, ALT, ALKPHOS, BILITOT, ALBUMIN in the last 168 hours.  Cardiac Enzymes Recent Labs  Lab 09/28/17 0542  TROPONINI 0.15*    Glucose Recent Labs  Lab 09/27/17 0728 09/27/17 1148 09/27/17 1551 09/27/17 2022 09/27/17 2355 09/28/17 0400  GLUCAP 152* 174* 186* 172* 172* 174*    Imaging No results found.       STUDIES:  Chest x-ray shows endotracheal tube in place, emphysema   CULTURES: September 26, 2017 respiratory culture September 26, 2017 respiratory viral panel  ANTIBIOTICS: Levaquin d/ced 09/27/17 positive for Influenza A   SIGNIFICANT EVENTS:     LINES/TUBES: January 1 endotracheal tube   DISCUSSION: 67 year old female with a past medical history significant for severe COPD admitted for a COPD exacerbation causing acute respiratory failure with hypoxemia necessitating mechanical ventilation.   ASSESSMENT / PLAN:   PULMONARY A: Acute respiratory failure with hypoxemia COPD exacerbation P:   Solu-Medrol 60mg  twice  daily DuoNeb every 6 hours Full mechanical vent support VAP prevention Daily WUA/SBT ABG still showing CO2 retention w/mild resp acidosis, pt already breathing over RR      CARDIOVASCULAR A:  Sinus tach Some T wave inversions leads 2,3, avf  overnight elevated troponin to 0.15 P:  Telemetry monitoring On diltiazem for HTN only no afib history Will trend troponins   RENAL A:   No acute issues P:   Monitor BMET and UOP Replace electrolytes as needed     GASTROINTESTINAL A:   GERD P:   Pepcid Tube feeding per protocol   HEMATOLOGIC A:   No acute issues P:  Monitor for bleeding   INFECTIOUS A:   COPD  exacerbation P:   Respiratory viral shows influenza A H1 Will d/c abx, now on tamiflu   ENDOCRINE A:   No acute issues P:   Monitor glucose   NEUROLOGIC A:   Acute encephalopathy secondary to intubation/hypercarbic respiratory failure P:   RASS goal: 0- -1 Propofol infusion, switched to fentanyl gtt due to pt discomfort,  as needed Versed per PAD protocol     FAMILY  - Updates: husband updated yesterday at bedside   - Inter-disciplinary family meet or Palliative Care meeting due by:  day 7     Pulmonary and Critical Care Medicine [facility] Pager: [phone]  09/28/2017, 7:39 AM

## 2017-09-29 ENCOUNTER — Inpatient Hospital Stay (HOSPITAL_COMMUNITY): Payer: Medicare Other

## 2017-09-29 ENCOUNTER — Ambulatory Visit (HOSPITAL_COMMUNITY): Payer: Medicare Other

## 2017-09-29 DIAGNOSIS — J9621 Acute and chronic respiratory failure with hypoxia: Secondary | ICD-10-CM

## 2017-09-29 DIAGNOSIS — J9622 Acute and chronic respiratory failure with hypercapnia: Secondary | ICD-10-CM

## 2017-09-29 DIAGNOSIS — R0600 Dyspnea, unspecified: Secondary | ICD-10-CM

## 2017-09-29 LAB — GLUCOSE, CAPILLARY
GLUCOSE-CAPILLARY: 185 mg/dL — AB (ref 65–99)
Glucose-Capillary: 151 mg/dL — ABNORMAL HIGH (ref 65–99)
Glucose-Capillary: 165 mg/dL — ABNORMAL HIGH (ref 65–99)
Glucose-Capillary: 166 mg/dL — ABNORMAL HIGH (ref 65–99)
Glucose-Capillary: 173 mg/dL — ABNORMAL HIGH (ref 65–99)
Glucose-Capillary: 176 mg/dL — ABNORMAL HIGH (ref 65–99)

## 2017-09-29 LAB — BASIC METABOLIC PANEL
Anion gap: 10 (ref 5–15)
BUN: 61 mg/dL — AB (ref 6–20)
CALCIUM: 8.8 mg/dL — AB (ref 8.9–10.3)
CO2: 30 mmol/L (ref 22–32)
CREATININE: 0.98 mg/dL (ref 0.44–1.00)
Chloride: 101 mmol/L (ref 101–111)
GFR calc non Af Amer: 59 mL/min — ABNORMAL LOW (ref >60)
GLUCOSE: 158 mg/dL — AB (ref 65–99)
Potassium: 4.6 mmol/L (ref 3.5–5.1)
Sodium: 141 mmol/L (ref 135–145)

## 2017-09-29 LAB — CBC
HEMATOCRIT: 36.3 % (ref 36.0–46.0)
Hemoglobin: 11.1 g/dL — ABNORMAL LOW (ref 12.0–15.0)
MCH: 29.5 pg (ref 26.0–34.0)
MCHC: 30.6 g/dL (ref 30.0–36.0)
MCV: 96.5 fL (ref 78.0–100.0)
Platelets: 338 10*3/uL (ref 150–400)
RBC: 3.76 MIL/uL — ABNORMAL LOW (ref 3.87–5.11)
RDW: 14 % (ref 11.5–15.5)
WBC: 12.1 10*3/uL — ABNORMAL HIGH (ref 4.0–10.5)

## 2017-09-29 LAB — ECHOCARDIOGRAM COMPLETE
Height: 61 in
WEIGHTICAEL: 2761.92 [oz_av]

## 2017-09-29 LAB — TRIGLYCERIDES: Triglycerides: 452 mg/dL — ABNORMAL HIGH (ref <150)

## 2017-09-29 LAB — TROPONIN I: Troponin I: 0.06 ng/mL (ref <0.03)

## 2017-09-29 MED ORDER — BISACODYL 10 MG RE SUPP
10.0000 mg | Freq: Once | RECTAL | Status: AC
  Administered 2017-09-29: 10 mg via RECTAL
  Filled 2017-09-29: qty 1

## 2017-09-29 MED ORDER — SENNOSIDES 8.8 MG/5ML PO SYRP
5.0000 mL | ORAL_SOLUTION | Freq: Two times a day (BID) | ORAL | Status: DC
  Administered 2017-09-29 – 2017-10-09 (×20): 5 mL
  Filled 2017-09-29 (×27): qty 5

## 2017-09-29 MED ORDER — ASPIRIN 81 MG PO CHEW
81.0000 mg | CHEWABLE_TABLET | Freq: Every day | ORAL | Status: DC
Start: 2017-09-29 — End: 2017-10-13
  Administered 2017-09-29 – 2017-10-13 (×13): 81 mg via ORAL
  Filled 2017-09-29 (×13): qty 1

## 2017-09-29 MED ORDER — METHYLPREDNISOLONE SODIUM SUCC 125 MG IJ SOLR
125.0000 mg | Freq: Three times a day (TID) | INTRAMUSCULAR | Status: DC
  Administered 2017-09-29 – 2017-09-30 (×3): 125 mg via INTRAVENOUS
  Filled 2017-09-29 (×3): qty 2

## 2017-09-29 NOTE — Progress Notes (Signed)
PULMONARY / CRITICAL CARE MEDICINE   Name: Sally [name] MRN: [id number] DOB: 1950/10/12    ADMISSION DATE:  09/26/2017 CONSULTATION DATE:  09/26/2017  REFERRING MD:  Juleen China  CHIEF COMPLAINT:  dyspnea  HISTORY OF PRESENT ILLNESS:   67 year old female with a past medical history significant for severe COPD came to the emergency department on September 26, 2017 complaining of shortness of breath.  Her husband provides the history because she was intubated on my arrival.  He says that she continues to smoke cigarettes, greater than 1 pack/day.  She uses albuterol about 3 times a day but does not take any other inhalers.  She does not use oxygen.  About a week ago they had a cough or cold and were prescribed a Z-Pak.  She said that her symptoms persisted.  He denied fevers or chills.  She came to the emergency department and was initially treated with BiPAP but she developed worsening respiratory failure and required intubation. Respiratory panel returned influenza A positive   PAST MEDICAL HISTORY :  She  has a past medical history of Asthma, Back pain, COPD (chronic obstructive pulmonary disease) (HCC), Crohn disease (HCC), and Hypertension.  PAST SURGICAL HISTORY: She  has a past surgical history that includes Abdominal hysterectomy; Fracture surgery (Left); and Cataract extraction, bilateral.  No Known Allergies  No current facility-administered medications on file prior to encounter.    Current Outpatient Medications on File Prior to Encounter  Medication Sig  . albuterol (PROVENTIL) (2.5 MG/3ML) 0.083% nebulizer solution Take 2.5 mg by nebulization every 6 (six) hours as needed for wheezing or shortness of breath.   . cefdinir (OMNICEF) 300 MG capsule Take 300 mg by mouth 2 (two) times daily.  . methylPREDNISolone (MEDROL DOSEPAK) 4 MG TBPK tablet Take by mouth.  Marland Kitchen albuterol (PROVENTIL HFA;VENTOLIN HFA) 108 (90 Base) MCG/ACT inhaler Inhale 1-2 puffs into the lungs every 6 (six) hours as  needed for wheezing or shortness of breath.  Ailene Ards ELLIPTA 62.5-25 MCG/INH AEPB Inhale 1 puff into the lungs daily.   Marland Kitchen aspirin EC 81 MG tablet Take 81 mg by mouth daily.  Marland Kitchen atorvastatin (LIPITOR) 20 MG tablet Take 20 mg by mouth at bedtime.  Marland Kitchen DILT-XR 240 MG 24 hr capsule Take 240 mg by mouth daily.  . DULoxetine (CYMBALTA) 30 MG capsule Take 1 capsule (30 mg total) by mouth daily.  Marland Kitchen FLOVENT HFA 110 MCG/ACT inhaler Inhale 2 puffs into the lungs every 12 (twelve) hours.   . hydrOXYzine (VISTARIL) 25 MG capsule Take 1 capsule (25 mg total) by mouth 3 (three) times daily as needed for anxiety.  . predniSONE (DELTASONE) 20 MG tablet Take 2 tablets (40 mg total) by mouth daily with breakfast.    FAMILY HISTORY:  Her indicated that the status of her other is unknown.   SOCIAL HISTORY: She  reports that she has been smoking.  she has never used smokeless tobacco. She reports that she does not drink alcohol or use drugs.  REVIEW OF SYSTEMS:   Pt intubated  SUBJECTIVE:  Wheezing improved, pt looks more comfortable today, troponins trending down  VITAL SIGNS: BP 132/61   Pulse 76   Temp 98.2 F (36.8 C) (Oral)   Resp 16   Ht 5\' 1"  (1.549 m)   Wt 172 lb 9.9 oz (78.3 kg)   SpO2 92%   BMI 32.62 kg/m   HEMODYNAMICS:    VENTILATOR SETTINGS: Vent Mode: PRVC FiO2 (%):  [30 %-40 %] 40 % Set  Rate:  [16 bmp] 16 bmp Vt Set:  [380 mL] 380 mL PEEP:  [5 cmH20-13 cmH20] 13 cmH20 Plateau Pressure:  [19 cmH20-26 cmH20] 26 cmH20  INTAKE / OUTPUT: I/O last 3 completed shifts: In: 2434.8 [I.V.:1234.8; NG/GT:1100; IV Piggyback:100] Out: 1550 [Urine:1550]  PHYSICAL EXAMINATION: General:  Sedated, comfortable Neuro:  Sedated, on vent HENT:  ETT in place PULM: Lungs clear today, vent supported breathing CV: RRR, no murmurs, clear S1, S2 GI: BS+, soft, nontender, slightly distended MSK: normal bulk and tone    LABS:  BMET Recent Labs  Lab 09/27/17 0949 09/28/17 1034  09/29/17 0514  NA 137 140 141  K 4.2 4.4 4.6  CL 102 104 101  CO2 26 26 30   BUN 23* 42* 61*  CREATININE 0.85 0.90 0.98  GLUCOSE 155* 168* 158*    Electrolytes Recent Labs  Lab 09/27/17 0949 09/27/17 1642 09/28/17 0542 09/28/17 1034 09/29/17 0514  CALCIUM 8.5*  --   --  8.8* 8.8*  MG 2.4 2.5* 2.6*  --   --   PHOS 4.3 3.1 3.2  --   --     CBC Recent Labs  Lab 09/27/17 0949 09/28/17 1034 09/29/17 0514  WBC 16.7* 14.7* 12.1*  HGB 12.8 12.0 11.1*  HCT 39.7 38.3 36.3  PLT 392 353 338    Coag's No results for input(s): APTT, INR in the last 168 hours.  Sepsis Markers No results for input(s): LATICACIDVEN, PROCALCITON, O2SATVEN in the last 168 hours.  ABG Recent Labs  Lab 09/27/17 1050 09/27/17 1453 09/28/17 1321  PHART 7.280* 7.281* 7.264*  PCO2ART 61.6* 61.3* 70.7*  PO2ART 155.0* 161.0* 125.0*    Liver Enzymes No results for input(s): AST, ALT, ALKPHOS, BILITOT, ALBUMIN in the last 168 hours.  Cardiac Enzymes Recent Labs  Lab 09/28/17 1034 09/28/17 1801 09/29/17 0041  TROPONINI 0.11* 0.08* 0.06*    Glucose Recent Labs  Lab 09/28/17 0802 09/28/17 1129 09/28/17 1525 09/28/17 2002 09/28/17 2346 09/29/17 0327  GLUCAP 172* 170* 166* 171* 182* 166*    Imaging No results found.       STUDIES:  Chest x-ray shows endotracheal tube in place, emphysema   CULTURES: September 26, 2017 respiratory culture September 26, 2017 respiratory viral panel  ANTIBIOTICS: Levaquin d/ced 09/27/17 positive for Influenza A   SIGNIFICANT EVENTS:     LINES/TUBES: January 1 endotracheal tube   DISCUSSION: 67 year old female with a past medical history significant for severe COPD admitted for a COPD exacerbation causing acute respiratory failure with hypoxemia necessitating mechanical ventilation.   ASSESSMENT / PLAN:   PULMONARY A: Acute respiratory failure with hypoxemia COPD exacerbation P:   Solu-Medrol 60mg  twice daily Albuterol every 4  hours Added pulmicort and brovana Full mechanical vent support VAP prevention Daily WUA/SBT Patient working with vent today RR 16, no breath stacking, looks more comfortable Will consider repeat ABG expect it to be better     CARDIOVASCULAR A:  Sinus tach Some T wave inversions 09/28/17 early morning,  leads 2,3, avf   elevated troponin to 0.15 P:  Telemetry monitoring On diltiazem for HTN only no afib history troponins trending down last was 0.06 Started asa Will need ischemic workup on discharge   RENAL A:   No acute issues P:   Cr stable Monitor BMET and UOP Replace electrolytes as needed     GASTROINTESTINAL A:   GERD P:   Pepcid Tube feeding per protocol   HEMATOLOGIC A:   No acute issues P:  Monitor for  bleeding   INFECTIOUS A:   COPD exacerbation P:   Respiratory viral shows influenza A H1  d/ced abx, now on tamiflu   ENDOCRINE A:   No acute issues P:   Monitor glucose   NEUROLOGIC A:   Acute encephalopathy secondary to intubation/hypercarbic respiratory failure P:   RASS goal: 0- -1 Propofol infusion, switched to fentanyl gtt due to pt discomfort,  as needed Versed per PAD protocol     FAMILY  - Updates: husband updated yesterday at bedside   - Inter-disciplinary family meet or Palliative Care meeting due by:  day 7     Pulmonary and Critical Care Medicine [facility] Pager: [phone]  09/29/2017, 7:23 AM

## 2017-09-29 NOTE — Progress Notes (Signed)
  Echocardiogram 2D Echocardiogram has been performed.  Janalyn Harder 09/29/2017, 9:56 AM

## 2017-09-30 ENCOUNTER — Inpatient Hospital Stay (HOSPITAL_COMMUNITY): Payer: Medicare Other

## 2017-09-30 LAB — BLOOD GAS, ARTERIAL
Acid-Base Excess: 8 mmol/L — ABNORMAL HIGH (ref 0.0–2.0)
BICARBONATE: 33 mmol/L — AB (ref 20.0–28.0)
Drawn by: 51191
FIO2: 50
MECHVT: 500 mL
O2 Saturation: 98.7 %
PEEP: 5 cmH2O
PO2 ART: 132 mmHg — AB (ref 83.0–108.0)
Patient temperature: 99.8
RATE: 16 resp/min
pCO2 arterial: 57.1 mmHg — ABNORMAL HIGH (ref 32.0–48.0)
pH, Arterial: 7.384 (ref 7.350–7.450)

## 2017-09-30 LAB — GLUCOSE, CAPILLARY
GLUCOSE-CAPILLARY: 172 mg/dL — AB (ref 65–99)
GLUCOSE-CAPILLARY: 212 mg/dL — AB (ref 65–99)
Glucose-Capillary: 163 mg/dL — ABNORMAL HIGH (ref 65–99)
Glucose-Capillary: 173 mg/dL — ABNORMAL HIGH (ref 65–99)
Glucose-Capillary: 195 mg/dL — ABNORMAL HIGH (ref 65–99)
Glucose-Capillary: 204 mg/dL — ABNORMAL HIGH (ref 65–99)

## 2017-09-30 LAB — CBC
HEMATOCRIT: 34 % — AB (ref 36.0–46.0)
Hemoglobin: 10.4 g/dL — ABNORMAL LOW (ref 12.0–15.0)
MCH: 29.4 pg (ref 26.0–34.0)
MCHC: 30.6 g/dL (ref 30.0–36.0)
MCV: 96 fL (ref 78.0–100.0)
Platelets: 303 10*3/uL (ref 150–400)
RBC: 3.54 MIL/uL — ABNORMAL LOW (ref 3.87–5.11)
RDW: 14.2 % (ref 11.5–15.5)
WBC: 11.2 10*3/uL — ABNORMAL HIGH (ref 4.0–10.5)

## 2017-09-30 LAB — BASIC METABOLIC PANEL
Anion gap: 9 (ref 5–15)
BUN: 55 mg/dL — ABNORMAL HIGH (ref 6–20)
CALCIUM: 9.2 mg/dL (ref 8.9–10.3)
CO2: 30 mmol/L (ref 22–32)
CREATININE: 0.81 mg/dL (ref 0.44–1.00)
Chloride: 106 mmol/L (ref 101–111)
GFR calc non Af Amer: >60 mL/min (ref >60)
GLUCOSE: 177 mg/dL — AB (ref 65–99)
Potassium: 4.7 mmol/L (ref 3.5–5.1)
Sodium: 145 mmol/L (ref 135–145)

## 2017-09-30 MED ORDER — METHYLPREDNISOLONE SODIUM SUCC 125 MG IJ SOLR
80.0000 mg | Freq: Three times a day (TID) | INTRAMUSCULAR | Status: DC
  Administered 2017-09-30 – 2017-10-02 (×6): 80 mg via INTRAVENOUS
  Filled 2017-09-30 (×7): qty 1.28

## 2017-09-30 MED ORDER — DEXMEDETOMIDINE HCL 200 MCG/2ML IV SOLN
0.4000 ug/kg/h | INTRAVENOUS | Status: DC
  Administered 2017-09-30: 0.4 ug/kg/h via INTRAVENOUS
  Administered 2017-09-30: 0.7 ug/kg/h via INTRAVENOUS
  Filled 2017-09-30 (×2): qty 2

## 2017-09-30 MED ORDER — DEXMEDETOMIDINE HCL IN NACL 400 MCG/100ML IV SOLN
0.4000 ug/kg/h | INTRAVENOUS | Status: DC
  Administered 2017-09-30 – 2017-10-01 (×3): 0.8 ug/kg/h via INTRAVENOUS
  Administered 2017-10-01: 1 ug/kg/h via INTRAVENOUS
  Administered 2017-10-01: 1.2 ug/kg/h via INTRAVENOUS
  Administered 2017-10-02 (×2): 0.8 ug/kg/h via INTRAVENOUS
  Administered 2017-10-02: 1.2 ug/kg/h via INTRAVENOUS
  Administered 2017-10-02 – 2017-10-03 (×3): 1 ug/kg/h via INTRAVENOUS
  Administered 2017-10-04: 0.8 ug/kg/h via INTRAVENOUS
  Administered 2017-10-04 (×2): 1 ug/kg/h via INTRAVENOUS
  Administered 2017-10-04: 0.8 ug/kg/h via INTRAVENOUS
  Administered 2017-10-05 (×4): 1.2 ug/kg/h via INTRAVENOUS
  Administered 2017-10-06: 0.6 ug/kg/h via INTRAVENOUS
  Administered 2017-10-06 (×3): 1.2 ug/kg/h via INTRAVENOUS
  Filled 2017-09-30 (×24): qty 100

## 2017-09-30 MED ORDER — DEXTROSE 5 % IV SOLN
1.0000 g | Freq: Every day | INTRAVENOUS | Status: AC
  Administered 2017-09-30 – 2017-10-04 (×5): 1 g via INTRAVENOUS
  Filled 2017-09-30 (×5): qty 10

## 2017-09-30 NOTE — Progress Notes (Signed)
Titrated FIO2 back down to  40% per order.

## 2017-09-30 NOTE — Progress Notes (Signed)
eLink Physician-Brief Progress Note Patient Name: Sally [name] DOB: 14-Jun-1951 MRN: [id number]   Date of Service  09/30/2017  HPI/Events of Note  Restraints renewed  eICU Interventions       Intervention Category Minor Interventions: Other:  [name] 09/30/2017, 8:04 PM

## 2017-09-30 NOTE — Progress Notes (Addendum)
67 year old woman, smoker with severe COPD admitted 1/1 with acute hypercarbic respiratory failure requiring mechanical ventilation due to influenza A  She remains synchronous overnight with new ventilator settings and oxygen saturation remains about 93% on 40% FiO2.  On my exam this morning, auto PEEP is minimal on tidal volume of 500 Rhonchi are decreased although expiration is prolonged she remains deeply sedated with RA SS -5 on propofol and fentanyl.  Labs were reviewed, triglycerides are increasing Chest x-ray shows right basilar atelectasis/infiltrate.  Impression/plan Acute hypercarbic respiratory failure due to COPD exacerbation-auto PEEP seems sufficiently controlled and bronchospasm has decreased somewhat. We will decrease IV Solu-Medrol 80 every 8 and initiate spontaneous breathing trials if agitation is controlled.  Influenza A-continue Tamiflu for 5 days Add empiric ceftriaxone for right lower lobe infiltrate   Agitation/acute encephalopathy related to inadequate sedation-since triglycerides are rising, will discontinue propofol and use Precedex instead with low-dose fentanyl to avoid ventilator asynchrony Clonazepam to continue  Hopeful for extubation over the next few days based on progress with weaning.  My critical care time x 32 m  Tennie Grussing V. Vassie Loll MD

## 2017-10-01 ENCOUNTER — Inpatient Hospital Stay (HOSPITAL_COMMUNITY): Payer: Medicare Other

## 2017-10-01 DIAGNOSIS — J101 Influenza due to other identified influenza virus with other respiratory manifestations: Secondary | ICD-10-CM

## 2017-10-01 LAB — CBC
HCT: 37 % (ref 36.0–46.0)
Hemoglobin: 11.1 g/dL — ABNORMAL LOW (ref 12.0–15.0)
MCH: 29.1 pg (ref 26.0–34.0)
MCHC: 30 g/dL (ref 30.0–36.0)
MCV: 96.9 fL (ref 78.0–100.0)
PLATELETS: 294 10*3/uL (ref 150–400)
RBC: 3.82 MIL/uL — ABNORMAL LOW (ref 3.87–5.11)
RDW: 14.9 % (ref 11.5–15.5)
WBC: 13.4 10*3/uL — ABNORMAL HIGH (ref 4.0–10.5)

## 2017-10-01 LAB — GLUCOSE, CAPILLARY
GLUCOSE-CAPILLARY: 190 mg/dL — AB (ref 65–99)
GLUCOSE-CAPILLARY: 223 mg/dL — AB (ref 65–99)
GLUCOSE-CAPILLARY: 178 mg/dL — AB (ref 65–99)
Glucose-Capillary: 210 mg/dL — ABNORMAL HIGH (ref 65–99)
Glucose-Capillary: 187 mg/dL — ABNORMAL HIGH (ref 65–99)

## 2017-10-01 LAB — BASIC METABOLIC PANEL
Anion gap: 6 (ref 5–15)
BUN: 41 mg/dL — ABNORMAL HIGH (ref 6–20)
CALCIUM: 8.3 mg/dL — AB (ref 8.9–10.3)
CO2: 33 mmol/L — ABNORMAL HIGH (ref 22–32)
CREATININE: 0.73 mg/dL (ref 0.44–1.00)
Chloride: 110 mmol/L (ref 101–111)
Glucose, Bld: 211 mg/dL — ABNORMAL HIGH (ref 65–99)
Potassium: 4.9 mmol/L (ref 3.5–5.1)
SODIUM: 149 mmol/L — AB (ref 135–145)

## 2017-10-01 LAB — MAGNESIUM: MAGNESIUM: 2.7 mg/dL — AB (ref 1.7–2.4)

## 2017-10-01 LAB — PHOSPHORUS: Phosphorus: 3.3 mg/dL (ref 2.5–4.6)

## 2017-10-01 MED ORDER — FREE WATER
300.0000 mL | Freq: Three times a day (TID) | Status: DC
  Administered 2017-10-01 – 2017-10-02 (×3): 300 mL

## 2017-10-01 MED ORDER — DILTIAZEM HCL 30 MG PO TABS
30.0000 mg | ORAL_TABLET | Freq: Three times a day (TID) | ORAL | Status: DC
  Administered 2017-10-01 – 2017-10-13 (×32): 30 mg via ORAL
  Filled 2017-10-01 (×37): qty 1

## 2017-10-01 MED ORDER — HYDRALAZINE HCL 20 MG/ML IJ SOLN
10.0000 mg | Freq: Four times a day (QID) | INTRAMUSCULAR | Status: DC | PRN
  Administered 2017-10-01 – 2017-10-06 (×4): 10 mg via INTRAVENOUS
  Filled 2017-10-01 (×4): qty 1

## 2017-10-01 NOTE — Progress Notes (Signed)
PULMONARY / CRITICAL CARE MEDICINE   Name: Sally [name] MRN: [id number] DOB: 26-Nov-1950    ADMISSION DATE:  09/26/2017 CONSULTATION DATE:  09/26/2017  REFERRING MD:  Juleen China  CHIEF COMPLAINT:  dyspnea  HISTORY OF PRESENT ILLNESS:   67 year old female with a past medical history significant for severe COPD came to the emergency department on September 26, 2017 complaining of shortness of breath.  Her husband provides the history because she was intubated on my arrival.  He says that she continues to smoke cigarettes, greater than 1 pack/day.  She uses albuterol about 3 times a day but does not take any other inhalers.  She does not use oxygen.  About a week ago they had a cough or cold and were prescribed a Z-Pak.  She said that her symptoms persisted.  He denied fevers or chills.  She came to the emergency department and was initially treated with BiPAP but she developed worsening respiratory failure and required intubation. Respiratory panel returned influenza A positive   PAST MEDICAL HISTORY :  She  has a past medical history of Asthma, Back pain, COPD (chronic obstructive pulmonary disease) (HCC), Crohn disease (HCC), and Hypertension.  PAST SURGICAL HISTORY: She  has a past surgical history that includes Abdominal hysterectomy; Fracture surgery (Left); and Cataract extraction, bilateral.  No Known Allergies  No current facility-administered medications on file prior to encounter.    Current Outpatient Medications on File Prior to Encounter  Medication Sig  . albuterol (PROVENTIL) (2.5 MG/3ML) 0.083% nebulizer solution Take 2.5 mg by nebulization every 6 (six) hours as needed for wheezing or shortness of breath.   . cefdinir (OMNICEF) 300 MG capsule Take 300 mg by mouth 2 (two) times daily.  . methylPREDNISolone (MEDROL DOSEPAK) 4 MG TBPK tablet Take by mouth.  Marland Kitchen albuterol (PROVENTIL HFA;VENTOLIN HFA) 108 (90 Base) MCG/ACT inhaler Inhale 1-2 puffs into the lungs every 6 (six) hours as  needed for wheezing or shortness of breath.  Ailene Ards ELLIPTA 62.5-25 MCG/INH AEPB Inhale 1 puff into the lungs daily.   Marland Kitchen aspirin EC 81 MG tablet Take 81 mg by mouth daily.  Marland Kitchen atorvastatin (LIPITOR) 20 MG tablet Take 20 mg by mouth at bedtime.  Marland Kitchen DILT-XR 240 MG 24 hr capsule Take 240 mg by mouth daily.  . DULoxetine (CYMBALTA) 30 MG capsule Take 1 capsule (30 mg total) by mouth daily.  Marland Kitchen FLOVENT HFA 110 MCG/ACT inhaler Inhale 2 puffs into the lungs every 12 (twelve) hours.   . hydrOXYzine (VISTARIL) 25 MG capsule Take 1 capsule (25 mg total) by mouth 3 (three) times daily as needed for anxiety.  . predniSONE (DELTASONE) 20 MG tablet Take 2 tablets (40 mg total) by mouth daily with breakfast.    FAMILY HISTORY:  Her indicated that the status of her other is unknown.   SOCIAL HISTORY: She  reports that she has been smoking.  she has never used smokeless tobacco. She reports that she does not drink alcohol or use drugs.  REVIEW OF SYSTEMS:   Pt intubated  SUBJECTIVE:  No wheezing today, taking deeper breaths, pt looks comfortable today,   VITAL SIGNS: BP (!) 160/65   Pulse 70   Temp 98.7 F (37.1 C) (Oral)   Resp 16   Ht 5\' 1"  (1.549 m)   Wt 175 lb 4.3 oz (79.5 kg)   SpO2 96%   BMI 33.12 kg/m   HEMODYNAMICS:    VENTILATOR SETTINGS: Vent Mode: PRVC FiO2 (%):  [40 %-50 %] 40 %  Set Rate:  [16 bmp] 16 bmp Vt Set:  [500 mL] 500 mL PEEP:  [5 cmH20] 5 cmH20 Pressure Support:  [10 cmH20] 10 cmH20 Plateau Pressure:  [14 cmH20-21 cmH20] 15 cmH20  INTAKE / OUTPUT: I/O last 3 completed shifts: In: 2467.8 [I.V.:1317.8; NG/GT:900; IV Piggyback:250] Out: 3600 [Urine:3600]  PHYSICAL EXAMINATION: General:  Sedated, comfortable Neuro:  Sedated, on vent HENT:  ETT in place PULM: Lungs clear today, vent supported breathing CV: RRR, no murmurs, clear S1, S2 GI: BS+, soft, nontender, slightly distended MSK: normal bulk and tone    LABS:  BMET Recent Labs  Lab 09/29/17 0514  09/30/17 0437 10/01/17 0225  NA 141 145 149*  K 4.6 4.7 4.9  CL 101 106 110  CO2 30 30 33*  BUN 61* 55* 41*  CREATININE 0.98 0.81 0.73  GLUCOSE 158* 177* 211*    Electrolytes Recent Labs  Lab 09/27/17 1642 09/28/17 0542  09/29/17 0514 09/30/17 0437 10/01/17 0225  CALCIUM  --   --    < > 8.8* 9.2 8.3*  MG 2.5* 2.6*  --   --   --  2.7*  PHOS 3.1 3.2  --   --   --  3.3   < > = values in this interval not displayed.    CBC Recent Labs  Lab 09/29/17 0514 09/30/17 0437 10/01/17 0225  WBC 12.1* 11.2* 13.4*  HGB 11.1* 10.4* 11.1*  HCT 36.3 34.0* 37.0  PLT 338 303 294    Coag's No results for input(s): APTT, INR in the last 168 hours.  Sepsis Markers No results for input(s): LATICACIDVEN, PROCALCITON, O2SATVEN in the last 168 hours.  ABG Recent Labs  Lab 09/28/17 1321 09/29/17 1230 09/30/17 0357  PHART 7.264* 7.209* 7.384  PCO2ART 70.7* 79.0* 57.1*  PO2ART 125.0* 104 132*    Liver Enzymes No results for input(s): AST, ALT, ALKPHOS, BILITOT, ALBUMIN in the last 168 hours.  Cardiac Enzymes Recent Labs  Lab 09/28/17 1034 09/28/17 1801 09/29/17 0041  TROPONINI 0.11* 0.08* 0.06*    Glucose Recent Labs  Lab 09/30/17 0739 09/30/17 1133 09/30/17 1606 09/30/17 1936 09/30/17 2349 10/01/17 0355  GLUCAP 163* 173* 212* 195* 204* 190*    Imaging No results found.       STUDIES:  Chest x-ray shows endotracheal tube in place, emphysema   CULTURES: September 26, 2017 respiratory culture September 26, 2017 respiratory viral panel  ANTIBIOTICS: Levaquin d/ced 09/27/17 positive for Influenza A Ceftriaxone 09/30/17----  for new RLL infiltrate   SIGNIFICANT EVENTS:     LINES/TUBES: January 1 endotracheal tube   DISCUSSION: 67 year old female with a past medical history significant for severe COPD admitted for a COPD exacerbation causing acute respiratory failure with hypoxemia necessitating mechanical ventilation.   ASSESSMENT / PLAN:    PULMONARY A: Acute respiratory failure with hypoxemia COPD exacerbation P:   Solu-Medrol @ 80mg  every 8 hours, will try to cut back Albuterol every 4 hours Added pulmicort and brovana Full mechanical vent support VAP prevention Daily WUA/SBT Patient working with vent today no dysynchrony Last ABG showed improvement in hypercapnea and pH     CARDIOVASCULAR A:  Sinus tach Some T wave inversions 09/28/17 early morning,  leads 2,3, avf   elevated troponin to 0.15 P:  Telemetry monitoring On diltiazem for HTN only no afib history troponins trending down last was 0.06 Started asa Will need ischemic workup on discharge BP trending up has standing dilt and PRN hydral , mild bradycardia with switch to precedex  will decrease dilt dose and increase hydral   RENAL A:   No acute issues P:   Cr stable Monitor BMET and UOP Replace electrolytes as needed Will add free water due to hypernatremia     GASTROINTESTINAL A:   GERD P:   Pepcid Tube feeding per protocol   HEMATOLOGIC A:   No acute issues P:  Monitor for bleeding   INFECTIOUS A:   COPD exacerbation P:   Respiratory viral shows influenza A H1 5 days of tamiflu Added ceftriaxone prophylactically for new RLL infiltrate   ENDOCRINE A:   Some increase in blood glucose due to steroid increase P:   ICU SSI   NEUROLOGIC A:   Acute encephalopathy secondary to intubation/hypercarbic respiratory failure P:   RASS goal: 0- -1 Propofol infusion d/ced switched to precedex, on fentanyl gtt due to pt discomfort,  conazepam scheduled, as needed Versed per PAD protocol     FAMILY  - Updates: husband updated yesterday at bedside   - Inter-disciplinary family meet or Palliative Care meeting due by:  day 7     Pulmonary and Critical Care Medicine [facility] Pager: [phone]  10/01/2017, 6:38 AM

## 2017-10-01 NOTE — Progress Notes (Signed)
Wasted 100 mL fentanyl in sink with Haliegh, RN.

## 2017-10-02 LAB — GLUCOSE, CAPILLARY
GLUCOSE-CAPILLARY: 151 mg/dL — AB (ref 65–99)
GLUCOSE-CAPILLARY: 206 mg/dL — AB (ref 65–99)
Glucose-Capillary: 203 mg/dL — ABNORMAL HIGH (ref 65–99)
Glucose-Capillary: 143 mg/dL — ABNORMAL HIGH (ref 65–99)
Glucose-Capillary: 139 mg/dL — ABNORMAL HIGH (ref 65–99)
Glucose-Capillary: 156 mg/dL — ABNORMAL HIGH (ref 65–99)

## 2017-10-02 LAB — BLOOD GAS, ARTERIAL
ACID-BASE EXCESS: 3 mmol/L — AB (ref 0.0–2.0)
BICARBONATE: 30.4 mmol/L — AB (ref 20.0–28.0)
Drawn by: 50522
FIO2: 40
LHR: 12 {breaths}/min
MECHVT: 500 mL
O2 Saturation: 96.6 %
PEEP/CPAP: 5 cmH2O
Patient temperature: 98.6
pCO2 arterial: 79 mmHg (ref 32.0–48.0)
pH, Arterial: 7.209 — ABNORMAL LOW (ref 7.350–7.450)
pO2, Arterial: 104 mmHg (ref 83.0–108.0)

## 2017-10-02 LAB — CBC
HCT: 38.5 % (ref 36.0–46.0)
HEMOGLOBIN: 11.5 g/dL — AB (ref 12.0–15.0)
MCH: 29.3 pg (ref 26.0–34.0)
MCHC: 29.9 g/dL — AB (ref 30.0–36.0)
MCV: 98 fL (ref 78.0–100.0)
PLATELETS: 297 10*3/uL (ref 150–400)
RBC: 3.93 MIL/uL (ref 3.87–5.11)
RDW: 15 % (ref 11.5–15.5)
WBC: 17.1 10*3/uL — ABNORMAL HIGH (ref 4.0–10.5)

## 2017-10-02 LAB — BASIC METABOLIC PANEL
Anion gap: 9 (ref 5–15)
BUN: 45 mg/dL — AB (ref 6–20)
CHLORIDE: 110 mmol/L (ref 101–111)
CO2: 31 mmol/L (ref 22–32)
CREATININE: 0.64 mg/dL (ref 0.44–1.00)
Calcium: 8.3 mg/dL — ABNORMAL LOW (ref 8.9–10.3)
GFR calc Af Amer: >60 mL/min (ref >60)
GFR calc non Af Amer: >60 mL/min (ref >60)
GLUCOSE: 228 mg/dL — AB (ref 65–99)
Potassium: 5 mmol/L (ref 3.5–5.1)
SODIUM: 150 mmol/L — AB (ref 135–145)

## 2017-10-02 MED ORDER — BISACODYL 10 MG RE SUPP
10.0000 mg | Freq: Once | RECTAL | Status: AC
  Administered 2017-10-02: 10 mg via RECTAL
  Filled 2017-10-02: qty 1

## 2017-10-02 MED ORDER — DEXTROSE 5 % IV SOLN: INTRAVENOUS | Status: DC

## 2017-10-02 MED ORDER — FREE WATER
300.0000 mL | Status: DC
  Administered 2017-10-02 – 2017-10-03 (×6): 300 mL

## 2017-10-02 MED ORDER — METHYLPREDNISOLONE SODIUM SUCC 40 MG IJ SOLR
40.0000 mg | Freq: Two times a day (BID) | INTRAMUSCULAR | Status: DC
  Administered 2017-10-02 – 2017-10-08 (×12): 40 mg via INTRAVENOUS
  Filled 2017-10-02 (×12): qty 1

## 2017-10-02 MED ORDER — LACTULOSE 10 GM/15ML PO SOLN
30.0000 g | Freq: Once | ORAL | Status: AC
  Administered 2017-10-02: 30 g
  Filled 2017-10-02: qty 45

## 2017-10-02 NOTE — Progress Notes (Signed)
PULMONARY / CRITICAL CARE MEDICINE   Name: Sally [name] MRN: [id number] DOB: 1950/10/31    ADMISSION DATE:  09/26/2017 CONSULTATION DATE:  09/26/2017  REFERRING MD:  Juleen China  CHIEF COMPLAINT:  dyspnea  HISTORY OF PRESENT ILLNESS:   67 year old female with a past medical history significant for severe COPD came to the emergency department on September 26, 2017 complaining of shortness of breath.  Her husband provides the history because she was intubated on my arrival.  He says that she continues to smoke cigarettes, greater than 1 pack/day.  She uses albuterol about 3 times a day but does not take any other inhalers.  She does not use oxygen.  About a week ago they had a cough or cold and were prescribed a Z-Pak.  She said that her symptoms persisted.  He denied fevers or chills.  She came to the emergency department and was initially treated with BiPAP but she developed worsening respiratory failure and required intubation. Respiratory panel returned influenza A positive   PAST MEDICAL HISTORY :  She  has a past medical history of Asthma, Back pain, COPD (chronic obstructive pulmonary disease) (HCC), Crohn disease (HCC), and Hypertension.  PAST SURGICAL HISTORY: She  has a past surgical history that includes Abdominal hysterectomy; Fracture surgery (Left); and Cataract extraction, bilateral.  No Known Allergies  No current facility-administered medications on file prior to encounter.    Current Outpatient Medications on File Prior to Encounter  Medication Sig  . albuterol (PROVENTIL) (2.5 MG/3ML) 0.083% nebulizer solution Take 2.5 mg by nebulization every 6 (six) hours as needed for wheezing or shortness of breath.   . cefdinir (OMNICEF) 300 MG capsule Take 300 mg by mouth 2 (two) times daily.  . methylPREDNISolone (MEDROL DOSEPAK) 4 MG TBPK tablet Take by mouth.  Marland Kitchen albuterol (PROVENTIL HFA;VENTOLIN HFA) 108 (90 Base) MCG/ACT inhaler Inhale 1-2 puffs into the lungs every 6 (six) hours as  needed for wheezing or shortness of breath.  Ailene Ards ELLIPTA 62.5-25 MCG/INH AEPB Inhale 1 puff into the lungs daily.   Marland Kitchen aspirin EC 81 MG tablet Take 81 mg by mouth daily.  Marland Kitchen atorvastatin (LIPITOR) 20 MG tablet Take 20 mg by mouth at bedtime.  Marland Kitchen DILT-XR 240 MG 24 hr capsule Take 240 mg by mouth daily.  . DULoxetine (CYMBALTA) 30 MG capsule Take 1 capsule (30 mg total) by mouth daily.  Marland Kitchen FLOVENT HFA 110 MCG/ACT inhaler Inhale 2 puffs into the lungs every 12 (twelve) hours.   . hydrOXYzine (VISTARIL) 25 MG capsule Take 1 capsule (25 mg total) by mouth 3 (three) times daily as needed for anxiety.  . predniSONE (DELTASONE) 20 MG tablet Take 2 tablets (40 mg total) by mouth daily with breakfast.    FAMILY HISTORY:  Her indicated that the status of her other is unknown.   SOCIAL HISTORY: She  reports that she has been smoking.  she has never used smokeless tobacco. She reports that she does not drink alcohol or use drugs.  REVIEW OF SYSTEMS:   Pt intubated  SUBJECTIVE:  No wheezing today, taking deeper breaths, pt looks comfortable today, later this morning working well with pressure support will attempt to wean  VITAL SIGNS: BP (!) 155/75   Pulse 77   Temp 98.7 F (37.1 C) (Oral)   Resp 18   Ht 5\' 1"  (1.549 m)   Wt 172 lb 6.4 oz (78.2 kg)   SpO2 98%   BMI 32.57 kg/m   HEMODYNAMICS:    VENTILATOR  SETTINGS: Vent Mode: PSV;CPAP FiO2 (%):  [40 %] 40 % Set Rate:  [16 bmp] 16 bmp Vt Set:  [500 mL] 500 mL PEEP:  [5 cmH20] 5 cmH20 Pressure Support:  [10 cmH20] 10 cmH20 Plateau Pressure:  [14 cmH20-21 cmH20] 18 cmH20  INTAKE / OUTPUT: I/O last 3 completed shifts: In: 3059.4 [I.V.:1059.4; NG/GT:1800; IV Piggyback:200] Out: 2775 [Urine:2775]  PHYSICAL EXAMINATION: General:  Sedated, comfortable Neuro:  Sedated, on vent HENT:  ETT in place PULM: Lungs clear today, vent supported breathing CV: RRR, no murmurs, clear S1, S2 GI: BS+, soft, nontender, slightly distended MSK:  normal bulk and tone    LABS:  BMET Recent Labs  Lab 09/30/17 0437 10/01/17 0225 10/02/17 0347  NA 145 149* 150*  K 4.7 4.9 5.0  CL 106 110 110  CO2 30 33* 31  BUN 55* 41* 45*  CREATININE 0.81 0.73 0.64  GLUCOSE 177* 211* 228*    Electrolytes Recent Labs  Lab 09/27/17 1642 09/28/17 0542  09/30/17 0437 10/01/17 0225 10/02/17 0347  CALCIUM  --   --    < > 9.2 8.3* 8.3*  MG 2.5* 2.6*  --   --  2.7*  --   PHOS 3.1 3.2  --   --  3.3  --    < > = values in this interval not displayed.    CBC Recent Labs  Lab 09/30/17 0437 10/01/17 0225 10/02/17 0347  WBC 11.2* 13.4* 17.1*  HGB 10.4* 11.1* 11.5*  HCT 34.0* 37.0 38.5  PLT 303 294 297    Coag's No results for input(s): APTT, INR in the last 168 hours.  Sepsis Markers No results for input(s): LATICACIDVEN, PROCALCITON, O2SATVEN in the last 168 hours.  ABG Recent Labs  Lab 09/28/17 1321 09/29/17 1230 09/30/17 0357  PHART 7.264* 7.209* 7.384  PCO2ART 70.7* 79.0* 57.1*  PO2ART 125.0* 104 132*    Liver Enzymes No results for input(s): AST, ALT, ALKPHOS, BILITOT, ALBUMIN in the last 168 hours.  Cardiac Enzymes Recent Labs  Lab 09/28/17 1034 09/28/17 1801 09/29/17 0041  TROPONINI 0.11* 0.08* 0.06*    Glucose Recent Labs  Lab 10/01/17 0806 10/01/17 1141 10/01/17 1612 10/01/17 2005 10/02/17 0005 10/02/17 0417  GLUCAP 223* 178* 210* 187* 206* 203*    Imaging No results found.       STUDIES:  Chest x-ray shows endotracheal tube in place, emphysema   CULTURES: September 26, 2017 respiratory culture September 26, 2017 respiratory viral panel  ANTIBIOTICS: Levaquin d/ced 09/27/17 positive for Influenza A Ceftriaxone 09/30/17----  for new RLL infiltrate   SIGNIFICANT EVENTS:     LINES/TUBES: January 1 endotracheal tube   DISCUSSION: 67 year old female with a past medical history significant for severe COPD admitted for a COPD exacerbation causing acute respiratory failure with hypoxemia  necessitating mechanical ventilation.   ASSESSMENT / PLAN:   PULMONARY A: Acute respiratory failure with hypoxemia COPD exacerbation P:   Solu-Medrol @ 40mg  every 12 hours decreased from 80mg  q6 Albuterol every 4 hours Added pulmicort and brovana Full mechanical vent support VAP prevention Daily WUA/SBT Patient working with vent today no dysynchrony Last ABG showed improvement in hypercapnea and pH Will attempt to wean today     CARDIOVASCULAR A:  Sinus tach Some T wave inversions 09/28/17 early morning,  leads 2,3, avf   elevated troponin to 0.15 P:  Telemetry monitoring On diltiazem for HTN only no afib history troponins trending down last was 0.06 Started asa Will need ischemic workup on discharge BP  trending up has standing dilt and PRN hydral , mild bradycardia with switch to precedex decreased dilt dose and increase hydral   RENAL A:   No acute issues P:   Cr stable Monitor BMET and UOP Replace electrolytes as needed Will add more free water due to hypernatremia     GASTROINTESTINAL A:   GERD P:   Pepcid Tube feeding per protocol Dulcolax suppository given Colace scheduled   HEMATOLOGIC A:   No acute issues P:  Monitor for bleeding   INFECTIOUS A:   COPD exacerbation P:   Respiratory viral shows influenza A H1 5 days of tamiflu Added 5 days ceftriaxone prophylactically for new RLL infiltrate   ENDOCRINE A:   Some increase in blood glucose due to steroid increase P:   ICU SSI   NEUROLOGIC A:   Acute encephalopathy secondary to intubation/hypercarbic respiratory failure P:   RASS goal: 0- -1 Propofol infusion d/ced switched to precedex due to rising triglycerides, on fentanyl gtt due to pt discomfort,  clonazepam scheduled, as needed Versed per PAD protocol     FAMILY  - Updates: husband updated yesterday at bedside   - Inter-disciplinary family meet or Palliative Care meeting due by:  day 7     Pulmonary and Critical Care  [facility] Pager: [phone]  10/02/2017, 7:52 AM

## 2017-10-02 NOTE — Progress Notes (Signed)
Inpatient Diabetes Program Recommendations  AACE/ADA: New Consensus Statement on Inpatient Glycemic Control (2015)  Target Ranges:  Prepandial:   less than 140 mg/dL      Peak postprandial:   less than 180 mg/dL (1-2 hours)      Critically ill patients:  140 - 180 mg/dL   Lab Results  Component Value Date   GLUCAP 143 (H) 10/02/2017    Review of Glycemic ControlResults for Sally [name], Sally [name] (MRN [id number]) as of 10/02/2017 11:33  Ref. Range 10/01/2017 16:12 10/01/2017 20:05 10/02/2017 00:05 10/02/2017 04:17 10/02/2017 08:18  Glucose-Capillary Latest Ref Range: 65 - 99 mg/dL 937 (H) 342 (H) 876 (H) 203 (H) 143 (H)    Diabetes history: None Outpatient Diabetes medications: None Current orders for Inpatient glycemic control:  Novolog moderate q 4 hours  Inpatient Diabetes Program Recommendations:    Blood sugars >goal.  May consider adding Levemir 10 units daily.   Thanks,  Beryl Meager, RN, BC-ADM Inpatient Diabetes Coordinator Pager [phone] (8a-5p)

## 2017-10-03 ENCOUNTER — Inpatient Hospital Stay (HOSPITAL_COMMUNITY): Payer: Medicare Other

## 2017-10-03 DIAGNOSIS — J9602 Acute respiratory failure with hypercapnia: Secondary | ICD-10-CM

## 2017-10-03 LAB — GLUCOSE, CAPILLARY
GLUCOSE-CAPILLARY: 168 mg/dL — AB (ref 65–99)
GLUCOSE-CAPILLARY: 128 mg/dL — AB (ref 65–99)
GLUCOSE-CAPILLARY: 129 mg/dL — AB (ref 65–99)
Glucose-Capillary: 168 mg/dL — ABNORMAL HIGH (ref 65–99)
Glucose-Capillary: 172 mg/dL — ABNORMAL HIGH (ref 65–99)
Glucose-Capillary: 199 mg/dL — ABNORMAL HIGH (ref 65–99)
Glucose-Capillary: 209 mg/dL — ABNORMAL HIGH (ref 65–99)

## 2017-10-03 LAB — BLOOD GAS, ARTERIAL
Acid-Base Excess: 7.3 mmol/L — ABNORMAL HIGH (ref 0.0–2.0)
Bicarbonate: 31.8 mmol/L — ABNORMAL HIGH (ref 20.0–28.0)
Drawn by: 331761
FIO2: 40
MECHVT: 500 mL
O2 Saturation: 97.4 %
PATIENT TEMPERATURE: 98.6
PCO2 ART: 49.9 mmHg — AB (ref 32.0–48.0)
PEEP: 5 cmH2O
RATE: 18 resp/min
pH, Arterial: 7.421 (ref 7.350–7.450)
pO2, Arterial: 104 mmHg (ref 83.0–108.0)

## 2017-10-03 LAB — BASIC METABOLIC PANEL
ANION GAP: 12 (ref 5–15)
BUN: 43 mg/dL — ABNORMAL HIGH (ref 6–20)
CO2: 29 mmol/L (ref 22–32)
Calcium: 8.4 mg/dL — ABNORMAL LOW (ref 8.9–10.3)
Chloride: 108 mmol/L (ref 101–111)
Creatinine, Ser: 0.7 mg/dL (ref 0.44–1.00)
GFR calc Af Amer: >60 mL/min (ref >60)
GFR calc non Af Amer: >60 mL/min (ref >60)
GLUCOSE: 196 mg/dL — AB (ref 65–99)
POTASSIUM: 4.6 mmol/L (ref 3.5–5.1)
Sodium: 149 mmol/L — ABNORMAL HIGH (ref 135–145)

## 2017-10-03 LAB — CBC
HEMATOCRIT: 38.4 % (ref 36.0–46.0)
Hemoglobin: 11.4 g/dL — ABNORMAL LOW (ref 12.0–15.0)
MCH: 29.5 pg (ref 26.0–34.0)
MCHC: 29.7 g/dL — ABNORMAL LOW (ref 30.0–36.0)
MCV: 99.5 fL (ref 78.0–100.0)
Platelets: 311 10*3/uL (ref 150–400)
RBC: 3.86 MIL/uL — AB (ref 3.87–5.11)
RDW: 15.2 % (ref 11.5–15.5)
WBC: 21.8 10*3/uL — AB (ref 4.0–10.5)

## 2017-10-03 MED ORDER — FENTANYL CITRATE (PF) 100 MCG/2ML IJ SOLN
50.0000 ug | INTRAMUSCULAR | Status: DC | PRN
  Administered 2017-10-03 – 2017-10-05 (×6): 50 ug via INTRAVENOUS
  Filled 2017-10-03 (×7): qty 2

## 2017-10-03 MED ORDER — MIDAZOLAM HCL 2 MG/2ML IJ SOLN
INTRAMUSCULAR | Status: AC
  Administered 2017-10-03: 2 mg
  Filled 2017-10-03: qty 2

## 2017-10-03 MED ORDER — FENTANYL CITRATE (PF) 100 MCG/2ML IJ SOLN
50.0000 ug | INTRAMUSCULAR | Status: AC | PRN
  Administered 2017-10-04 – 2017-10-05 (×3): 50 ug via INTRAVENOUS
  Filled 2017-10-03 (×3): qty 2

## 2017-10-03 MED ORDER — VITAL HIGH PROTEIN PO LIQD
1000.0000 mL | ORAL | Status: DC
  Administered 2017-10-03: 1000 mL

## 2017-10-03 MED ORDER — ETOMIDATE 2 MG/ML IV SOLN
30.0000 mg | Freq: Once | INTRAVENOUS | Status: AC
  Administered 2017-10-03: 30 mg via INTRAVENOUS
  Filled 2017-10-03: qty 15

## 2017-10-03 MED ORDER — FREE WATER
200.0000 mL | Freq: Three times a day (TID) | Status: DC
  Administered 2017-10-03 – 2017-10-04 (×2): 200 mL

## 2017-10-03 MED ORDER — DEXTROSE 5 % IV SOLN
INTRAVENOUS | Status: DC
  Administered 2017-10-03 (×2): via INTRAVENOUS

## 2017-10-03 MED ORDER — FENTANYL CITRATE (PF) 100 MCG/2ML IJ SOLN
INTRAMUSCULAR | Status: AC
  Administered 2017-10-03: 100 ug
  Filled 2017-10-03: qty 2

## 2017-10-03 MED ORDER — INSULIN DETEMIR 100 UNIT/ML ~~LOC~~ SOLN
10.0000 [IU] | Freq: Every day | SUBCUTANEOUS | Status: DC
  Administered 2017-10-03 – 2017-10-08 (×6): 10 [IU] via SUBCUTANEOUS
  Filled 2017-10-03 (×7): qty 0.1

## 2017-10-03 NOTE — Procedures (Signed)
Extubation Procedure Note  Patient Details:   Name: Sally [name] DOB: 1951-08-16 MRN: [id number]   Airway Documentation:     Evaluation  O2 sats: stable throughout Complications: Complications of patient began to experience difficulty breathing & was placed on BIPAP per CCM.  Patient did not tolerate procedure well. Bilateral Breath Sounds: Expiratory wheezes, Stridor(Dr. Vassie Loll made aware )   Yes   Patient was extubated to a 4L [place] & was given 2.5 of Albuterol without relief of shortness of breath. Patient's began to have stridor and wheezes in all lung fields. CCM MD was made aware & patient was immediately placed on BIPAP 14/7 with 40% FIO2.   Sally [name], Sally [name] 10/03/2017, 11:32 AM

## 2017-10-03 NOTE — Progress Notes (Signed)
175 mls Fentanyl wasted and witnessed by April Lewis RN

## 2017-10-03 NOTE — Progress Notes (Signed)
PULMONARY / CRITICAL CARE MEDICINE   Name: Sally [name] MRN: [id number] DOB: 07-23-1951    ADMISSION DATE:  09/26/2017 CONSULTATION DATE:  09/26/2017  REFERRING MD:  Juleen China  CHIEF COMPLAINT:  dyspnea  HISTORY OF PRESENT ILLNESS:   67 year old female with a past medical history significant for severe COPD came to the emergency department on September 26, 2017 complaining of shortness of breath.  Her husband provides the history because she was intubated on my arrival.  He says that she continues to smoke cigarettes, greater than 1 pack/day.  She uses albuterol about 3 times a day but does not take any other inhalers.  She does not use oxygen.  About a week ago they had a cough or cold and were prescribed a Z-Pak.  She said that her symptoms persisted.  He denied fevers or chills.  She came to the emergency department and was initially treated with BiPAP but she developed worsening respiratory failure and required intubation. Respiratory panel returned influenza A positive   PAST MEDICAL HISTORY :  She  has a past medical history of Asthma, Back pain, COPD (chronic obstructive pulmonary disease) (HCC), Crohn disease (HCC), and Hypertension.  PAST SURGICAL HISTORY: She  has a past surgical history that includes Abdominal hysterectomy; Fracture surgery (Left); and Cataract extraction, bilateral.  No Known Allergies  No current facility-administered medications on file prior to encounter.    Current Outpatient Medications on File Prior to Encounter  Medication Sig  . albuterol (PROVENTIL) (2.5 MG/3ML) 0.083% nebulizer solution Take 2.5 mg by nebulization every 6 (six) hours as needed for wheezing or shortness of breath.   . cefdinir (OMNICEF) 300 MG capsule Take 300 mg by mouth 2 (two) times daily.  . methylPREDNISolone (MEDROL DOSEPAK) 4 MG TBPK tablet Take by mouth.  Marland Kitchen albuterol (PROVENTIL HFA;VENTOLIN HFA) 108 (90 Base) MCG/ACT inhaler Inhale 1-2 puffs into the lungs every 6 (six) hours as  needed for wheezing or shortness of breath.  Ailene Ards ELLIPTA 62.5-25 MCG/INH AEPB Inhale 1 puff into the lungs daily.   Marland Kitchen aspirin EC 81 MG tablet Take 81 mg by mouth daily.  Marland Kitchen atorvastatin (LIPITOR) 20 MG tablet Take 20 mg by mouth at bedtime.  Marland Kitchen DILT-XR 240 MG 24 hr capsule Take 240 mg by mouth daily.  . DULoxetine (CYMBALTA) 30 MG capsule Take 1 capsule (30 mg total) by mouth daily.  Marland Kitchen FLOVENT HFA 110 MCG/ACT inhaler Inhale 2 puffs into the lungs every 12 (twelve) hours.   . hydrOXYzine (VISTARIL) 25 MG capsule Take 1 capsule (25 mg total) by mouth 3 (three) times daily as needed for anxiety.  . predniSONE (DELTASONE) 20 MG tablet Take 2 tablets (40 mg total) by mouth daily with breakfast.    FAMILY HISTORY:  Her indicated that the status of her other is unknown.   SOCIAL HISTORY: She  reports that she has been smoking.  she has never used smokeless tobacco. She reports that she does not drink alcohol or use drugs.  REVIEW OF SYSTEMS:   Pt intubated  SUBJECTIVE:  No wheezing today, taking deeper breaths, pt looks comfortable today, will attempt to extubate this morning   VITAL SIGNS: BP 133/66   Pulse 71   Temp 98.6 F (37 C)   Resp 17   Ht 5\' 1"  (1.549 m)   Wt 171 lb 4.8 oz (77.7 kg)   SpO2 99%   BMI 32.37 kg/m   HEMODYNAMICS:    VENTILATOR SETTINGS: Vent Mode: PRVC FiO2 (%):  [  40 %] 40 % Set Rate:  [16 bmp] 16 bmp Vt Set:  [500 mL] 500 mL PEEP:  [5 cmH20] 5 cmH20 Pressure Support:  [5 cmH20-15 cmH20] 15 cmH20 Plateau Pressure:  [12 cmH20-19 cmH20] 12 cmH20  INTAKE / OUTPUT: I/O last 3 completed shifts: In: 5490 [P.O.:1300; I.V.:1005; ZO/XW:9604; IV Piggyback:200] Out: 2785 [Urine:2785]  PHYSICAL EXAMINATION: General:  Sedated, comfortable Neuro:  Sedated, on vent HENT:  ETT in place PULM: Lungs clear today, vent supported breathing CV: RRR, no murmurs, clear S1, S2 GI: BS+, soft, nontender, slightly distended MSK: normal bulk and  tone    LABS:  BMET Recent Labs  Lab 10/01/17 0225 10/02/17 0347 10/03/17 0333  NA 149* 150* 149*  K 4.9 5.0 4.6  CL 110 110 108  CO2 33* 31 29  BUN 41* 45* 43*  CREATININE 0.73 0.64 0.70  GLUCOSE 211* 228* 196*    Electrolytes Recent Labs  Lab 09/27/17 1642 09/28/17 0542  10/01/17 0225 10/02/17 0347 10/03/17 0333  CALCIUM  --   --    < > 8.3* 8.3* 8.4*  MG 2.5* 2.6*  --  2.7*  --   --   PHOS 3.1 3.2  --  3.3  --   --    < > = values in this interval not displayed.    CBC Recent Labs  Lab 10/01/17 0225 10/02/17 0347 10/03/17 0333  WBC 13.4* 17.1* 21.8*  HGB 11.1* 11.5* 11.4*  HCT 37.0 38.5 38.4  PLT 294 297 311    Coag's No results for input(s): APTT, INR in the last 168 hours.  Sepsis Markers No results for input(s): LATICACIDVEN, PROCALCITON, O2SATVEN in the last 168 hours.  ABG Recent Labs  Lab 09/28/17 1321 09/29/17 1230 09/30/17 0357  PHART 7.264* 7.209* 7.384  PCO2ART 70.7* 79.0* 57.1*  PO2ART 125.0* 104 132*    Liver Enzymes No results for input(s): AST, ALT, ALKPHOS, BILITOT, ALBUMIN in the last 168 hours.  Cardiac Enzymes Recent Labs  Lab 09/28/17 1034 09/28/17 1801 09/29/17 0041  TROPONINI 0.11* 0.08* 0.06*    Glucose Recent Labs  Lab 10/02/17 1151 10/02/17 1526 10/02/17 2028 10/03/17 0058 10/03/17 0322 10/03/17 0748  GLUCAP 151* 139* 156* 172* 168* 209*    Imaging No results found.       STUDIES:  Chest x-ray shows endotracheal tube in place, emphysema   CULTURES: September 26, 2017 respiratory culture September 26, 2017 respiratory viral panel  ANTIBIOTICS: Levaquin d/ced 09/27/17 positive for Influenza A Ceftriaxone 09/30/17----  for new RLL infiltrate   SIGNIFICANT EVENTS:     LINES/TUBES: January 1 endotracheal tube   DISCUSSION: 67 year old female with a past medical history significant for severe COPD admitted for a COPD exacerbation causing acute respiratory failure with hypoxemia necessitating  mechanical ventilation.   ASSESSMENT / PLAN:   PULMONARY A: Acute respiratory failure with hypoxemia COPD exacerbation P:   Solu-Medrol @ 40mg  every 12 hours decreased from 80mg  q6 Albuterol every 4 hours Added pulmicort and brovana Full mechanical vent support VAP prevention Daily WUA/SBT Patient working with vent today no dysynchrony Last ABG showed improvement in hypercapnea and pH Will attempt to wean today     CARDIOVASCULAR A:  Sinus tach Some T wave inversions 09/28/17 early morning,  leads 2,3, avf   elevated troponin to 0.15 P:  Telemetry monitoring On diltiazem for HTN only no afib history troponins trending down last was 0.06 Started asa Will need ischemic workup on discharge BP trending up has standing dilt  and PRN hydral , mild bradycardia with switch to precedex decreased dilt dose and increase hydral   RENAL A:   No acute issues P:   Cr stable Monitor BMET and UOP Replace electrolytes as needed Free water doubled yesterday need to add D5W due to poor response    GASTROINTESTINAL A:   GERD P:   Pepcid Tube feeding per protocol Colace scheduled Had bowel movements after lactulose yesterday evening   HEMATOLOGIC A:   No acute issues P:  Monitor for bleeding   INFECTIOUS A:   COPD exacerbation P:   Respiratory viral shows influenza A H1 5 days of tamiflu Added 5 days ceftriaxone prophylactically for new RLL infiltrate   ENDOCRINE A:   Some increase in blood glucose due to steroid increase P:   ICU SSI   NEUROLOGIC A:   Acute encephalopathy secondary to intubation/hypercarbic respiratory failure P:   RASS goal: 0- -1 Propofol infusion d/ced switched to precedex due to rising triglycerides, on fentanyl gtt due to pt discomfort,  clonazepam scheduled, as needed Versed per PAD protocol     FAMILY  - Updates: husband updated yesterday at bedside   - Inter-disciplinary family meet or Palliative Care meeting due by:  day 7      Pulmonary and Critical Care Medicine [facility] Pager: [phone]  10/03/2017, 7:51 AM

## 2017-10-03 NOTE — Progress Notes (Signed)
PT Cancellation Note  Patient Details Name: Sally [name] MRN: [id number] DOB: 11/29/1950   Cancelled Treatment:    Reason Eval/Treat Not Completed: Patient not medically ready, patient recently extubated then placed on bipap. Not appropriate for evaluation at this time, will follow for readiness.   Fabio Asa 10/03/2017, 1:43 PM

## 2017-10-03 NOTE — Progress Notes (Signed)
Patient's daughter updated via phone that her mom was intubated again .

## 2017-10-04 ENCOUNTER — Inpatient Hospital Stay (HOSPITAL_COMMUNITY): Payer: Medicare Other

## 2017-10-04 LAB — GLUCOSE, CAPILLARY
GLUCOSE-CAPILLARY: 178 mg/dL — AB (ref 65–99)
Glucose-Capillary: 206 mg/dL — ABNORMAL HIGH (ref 65–99)
Glucose-Capillary: 153 mg/dL — ABNORMAL HIGH (ref 65–99)
Glucose-Capillary: 114 mg/dL — ABNORMAL HIGH (ref 65–99)
Glucose-Capillary: 153 mg/dL — ABNORMAL HIGH (ref 65–99)
Glucose-Capillary: 173 mg/dL — ABNORMAL HIGH (ref 65–99)

## 2017-10-04 LAB — CBC
HEMATOCRIT: 36.4 % (ref 36.0–46.0)
HEMOGLOBIN: 11 g/dL — AB (ref 12.0–15.0)
MCH: 28.9 pg (ref 26.0–34.0)
MCHC: 30.2 g/dL (ref 30.0–36.0)
MCV: 95.8 fL (ref 78.0–100.0)
Platelets: 271 10*3/uL (ref 150–400)
RBC: 3.8 MIL/uL — AB (ref 3.87–5.11)
RDW: 14.5 % (ref 11.5–15.5)
WBC: 18.7 10*3/uL — ABNORMAL HIGH (ref 4.0–10.5)

## 2017-10-04 LAB — BASIC METABOLIC PANEL
Anion gap: 8 (ref 5–15)
BUN: 39 mg/dL — ABNORMAL HIGH (ref 6–20)
CALCIUM: 8.1 mg/dL — AB (ref 8.9–10.3)
CO2: 30 mmol/L (ref 22–32)
Chloride: 102 mmol/L (ref 101–111)
Creatinine, Ser: 0.62 mg/dL (ref 0.44–1.00)
GFR calc non Af Amer: >60 mL/min (ref >60)
GLUCOSE: 186 mg/dL — AB (ref 65–99)
POTASSIUM: 4.8 mmol/L (ref 3.5–5.1)
Sodium: 140 mmol/L (ref 135–145)

## 2017-10-04 MED ORDER — VITAL HIGH PROTEIN PO LIQD
1000.0000 mL | ORAL | Status: DC
  Administered 2017-10-04: 1000 mL
  Filled 2017-10-04: qty 1000

## 2017-10-04 MED ORDER — FREE WATER
300.0000 mL | Freq: Four times a day (QID) | Status: DC
  Administered 2017-10-04 – 2017-10-05 (×3): 300 mL

## 2017-10-04 MED ORDER — NICOTINE 21 MG/24HR TD PT24
21.0000 mg | MEDICATED_PATCH | Freq: Every day | TRANSDERMAL | Status: DC
Start: 2017-10-04 — End: 2017-10-27
  Administered 2017-10-04 – 2017-10-27 (×24): 21 mg via TRANSDERMAL
  Filled 2017-10-04 (×24): qty 1

## 2017-10-04 MED ORDER — FREE WATER: 250.0000 mL | Freq: Three times a day (TID) | Status: DC

## 2017-10-04 MED ORDER — PRO-STAT SUGAR FREE PO LIQD
30.0000 mL | Freq: Every day | ORAL | Status: DC
  Administered 2017-10-04 – 2017-10-06 (×3): 30 mL
  Filled 2017-10-04 (×3): qty 30

## 2017-10-04 NOTE — Progress Notes (Signed)
PT Cancellation Note  Patient Details Name: Sally [name] MRN: [id number] DOB: 03-21-1951   Cancelled Treatment:    Reason Eval/Treat Not Completed: Patient not medically ready. Pt re-intubated (failed extubation yesterday 10/03/17). Pt's RN requesting to hold PT for today. PT will continue to f/u with pt as available and appropriate.    Alessandra Bevels Barnaby Rippeon 10/04/2017, 10:22 AM

## 2017-10-04 NOTE — Procedures (Signed)
Intubation Procedure Note Sally [name] [id number] September 12, 1951  Procedure: Intubation Indications: Respiratory insufficiency  Procedure Details Consent: Risks of procedure as well as the alternatives and risks of each were explained to the (patient/caregiver).  Consent for procedure obtained. Time Out: Verified patient identification, verified procedure, site/side was marked, verified correct patient position, special equipment/implants available, medications/allergies/relevent history reviewed, required imaging and test results available.  Performed  Maximum sterile technique was used including cap, gloves, gown, hand hygiene and mask.  MAC and 3  Versed 2 fent 100 Etomidate 30 in divided doses  Evaluation Hemodynamic Status: BP stable throughout; O2 sats: stable throughout Patient's Current Condition: stable Complications: No apparent complications Patient did tolerate procedure well. Chest X-ray ordered to verify placement.  CXR: tube position acceptable.   Leanna Sato Elsworth Soho 10/04/2017

## 2017-10-04 NOTE — Progress Notes (Signed)
Nutrition Follow-up  DOCUMENTATION CODES:   Not applicable  INTERVENTION:    Vital High Protein at 40 ml/h (960 ml per day)  Pro-stat 30 ml once daily  Provides 1060 kcal, 99 gm protein, 803 ml free water daily  NUTRITION DIAGNOSIS:   Inadequate oral intake related to inability to eat as evidenced by NPO status.  Ongoing  GOAL:   Provide needs based on ASPEN/SCCM guidelines   Being addressed with TF  MONITOR:   Vent status, TF tolerance, Labs, I & O's  ASSESSMENT:   67 yo female with PMH of COPD, HTN, Crohn's DZ, asthma who was admitted on 1/1 with COPD exacerbation, requiring intubation.  Discussed patient in ICU rounds and with RN today. Patient was extubated yesterday, but required re-intubation. TF was held for extubation, but resumed after re-intubation. Patient is currently receiving Vital High Protein via OGT at 25 ml/h (600 ml/day) to provide 600 kcals, 53 gm protein, 502 ml free water daily.  Patient remains intubated on ventilator support MV: 7.8 L/min Temp (24hrs), Avg:98.8 F (37.1 C), Min:98.6 F (37 C), Max:99 F (37.2 C)  Propofol: off Labs reviewed. CBG's: [phone] Medications reviewed.  Diet Order:  No diet orders on file  EDUCATION NEEDS:   No education needs have been identified at this time  Skin:  Skin Assessment: Reviewed RN Assessment  Last BM:  1/7 (rectal tube)  Height:   Ht Readings from Last 1 Encounters:  09/26/17 5\' 1"  (1.549 m)    Weight:   Wt Readings from Last 1 Encounters:  10/04/17 172 lb 6.4 oz (78.2 kg)    Ideal Body Weight:  47.7 kg  BMI:  Body mass index is 32.57 kg/m.  Estimated Nutritional Needs:   Kcal:  [phone]  Protein:  95 gm  Fluid:  1.2-1.4 L   Joaquin Courts, RD, LDN, CNSC Pager [phone] After Hours Pager [phone]

## 2017-10-04 NOTE — Care Management Note (Signed)
Case Management Note  Patient Details  Name: Sally [name] MRN: [id number] Date of Birth: Feb 13, 1951  Subjective/Objective:                   Pt admitted with respiratory failure secondary to the flu  Action/Plan:   PTA independent from home with husband.  Pt had to be re-intubated 10/03/17.  CM will continue to follow for discharge needs   Expected Discharge Date:                  Expected Discharge Plan:  (From home with husband)  In-House Referral:     Discharge planning Services  CM Consult  Post Acute Care Choice:    Choice offered to:     DME Arranged:    DME Agency:     HH Arranged:    HH Agency:     Status of Service:     If discussed at [employer], dates discussed:    Additional Comments:  Cherylann Parr, RN 10/04/2017, 4:24 PM

## 2017-10-04 NOTE — Progress Notes (Signed)
PULMONARY / CRITICAL CARE MEDICINE   Name: Sally [name] MRN: [id number] DOB: [birth year]    ADMISSION DATE:  09/26/2017 CONSULTATION DATE:  09/26/2017  REFERRING MD:  Juleen China  CHIEF COMPLAINT:  dyspnea  HISTORY OF PRESENT ILLNESS:   67 year old female with a past medical history significant for severe COPD came to the emergency department on September 26, 2017 complaining of shortness of breath.  Her husband provides the history because she was intubated on my arrival.  He says that she continues to smoke cigarettes, greater than 1 pack/day.  She uses albuterol about 3 times a day but does not take any other inhalers.  She does not use oxygen.  About a week ago they had a cough or cold and were prescribed a Z-Pak.  She said that her symptoms persisted.  He denied fevers or chills.  She came to the emergency department and was initially treated with BiPAP but she developed worsening respiratory failure and required intubation. Respiratory panel returned influenza A positive   PAST MEDICAL HISTORY :  She  has a past medical history of Asthma, Back pain, COPD (chronic obstructive pulmonary disease) (HCC), Crohn disease (HCC), and Hypertension.  PAST SURGICAL HISTORY: She  has a past surgical history that includes Abdominal hysterectomy; Fracture surgery (Left); and Cataract extraction, bilateral.  No Known Allergies  No current facility-administered medications on file prior to encounter.    Current Outpatient Medications on File Prior to Encounter  Medication Sig  . albuterol (PROVENTIL) (2.5 MG/3ML) 0.083% nebulizer solution Take 2.5 mg by nebulization every 6 (six) hours as needed for wheezing or shortness of breath.   . cefdinir (OMNICEF) 300 MG capsule Take 300 mg by mouth 2 (two) times daily.  . methylPREDNISolone (MEDROL DOSEPAK) 4 MG TBPK tablet Take by mouth.  Marland Kitchen albuterol (PROVENTIL HFA;VENTOLIN HFA) 108 (90 Base) MCG/ACT inhaler Inhale 1-2 puffs into the lungs every 6 (six) hours as  needed for wheezing or shortness of breath.  Ailene Ards ELLIPTA 62.5-25 MCG/INH AEPB Inhale 1 puff into the lungs daily.   Marland Kitchen aspirin EC 81 MG tablet Take 81 mg by mouth daily.  Marland Kitchen atorvastatin (LIPITOR) 20 MG tablet Take 20 mg by mouth at bedtime.  Marland Kitchen DILT-XR 240 MG 24 hr capsule Take 240 mg by mouth daily.  . DULoxetine (CYMBALTA) 30 MG capsule Take 1 capsule (30 mg total) by mouth daily.  Marland Kitchen FLOVENT HFA 110 MCG/ACT inhaler Inhale 2 puffs into the lungs every 12 (twelve) hours.   . hydrOXYzine (VISTARIL) 25 MG capsule Take 1 capsule (25 mg total) by mouth 3 (three) times daily as needed for anxiety.  . predniSONE (DELTASONE) 20 MG tablet Take 2 tablets (40 mg total) by mouth daily with breakfast.    FAMILY HISTORY:  Her indicated that the status of her other is unknown.   SOCIAL HISTORY: She  reports that she has been smoking.  she has never used smokeless tobacco. She reports that she does not drink alcohol or use drugs.  REVIEW OF SYSTEMS:   Pt intubated  SUBJECTIVE:  Pt failed extubation yesterday, reintubated on 10/5 pressure support ventilation this am, hyponatremia corrected    VITAL SIGNS: BP (!) 111/55   Pulse 66   Temp 99 F (37.2 C) (Oral)   Resp 19   Ht 5\' 1"  (1.549 m)   Wt 172 lb 6.4 oz (78.2 kg)   SpO2 98%   BMI 32.57 kg/m   HEMODYNAMICS:    VENTILATOR SETTINGS: Vent Mode: PSV;CPAP FiO2 (%):  [  40 %] 40 % Set Rate:  [10 bmp-18 bmp] 18 bmp Vt Set:  [500 mL] 500 mL PEEP:  [5 cmH20-7 cmH20] 5 cmH20 Pressure Support:  [5 cmH20-10 cmH20] 10 cmH20 Plateau Pressure:  [16 cmH20-19 cmH20] 16 cmH20  INTAKE / OUTPUT: I/O last 3 completed shifts: In: 4294.3 [I.V.:2354.3; NG/GT:1740; IV Piggyback:200] Out: 2152 [Urine:2151; Stool:1]  PHYSICAL EXAMINATION: General:  Sedated, comfortable Neuro:  Sedated, on vent HENT:  ETT in place PULM: Lungs clear today, vent supported breathing CV: RRR, no murmurs, clear S1, S2 GI: BS+, soft, nontender, slightly distended MSK:  normal bulk and tone    LABS:  BMET Recent Labs  Lab 10/02/17 0347 10/03/17 0333 10/04/17 0326  NA 150* 149* 140  K 5.0 4.6 4.8  CL 110 108 102  CO2 31 29 30   BUN 45* 43* 39*  CREATININE 0.64 0.70 0.62  GLUCOSE 228* 196* 186*    Electrolytes Recent Labs  Lab 09/27/17 1642 09/28/17 0542  10/01/17 0225 10/02/17 0347 10/03/17 0333 10/04/17 0326  CALCIUM  --   --    < > 8.3* 8.3* 8.4* 8.1*  MG 2.5* 2.6*  --  2.7*  --   --   --   PHOS 3.1 3.2  --  3.3  --   --   --    < > = values in this interval not displayed.    CBC Recent Labs  Lab 10/02/17 0347 10/03/17 0333 10/04/17 0326  WBC 17.1* 21.8* 18.7*  HGB 11.5* 11.4* 11.0*  HCT 38.5 38.4 36.4  PLT 297 311 271    Coag's No results for input(s): APTT, INR in the last 168 hours.  Sepsis Markers No results for input(s): LATICACIDVEN, PROCALCITON, O2SATVEN in the last 168 hours.  ABG Recent Labs  Lab 09/29/17 1230 09/30/17 0357 10/03/17 1829  PHART 7.209* 7.384 7.421  PCO2ART 79.0* 57.1* 49.9*  PO2ART 104 132* 104    Liver Enzymes No results for input(s): AST, ALT, ALKPHOS, BILITOT, ALBUMIN in the last 168 hours.  Cardiac Enzymes Recent Labs  Lab 09/28/17 1034 09/28/17 1801 09/29/17 0041  TROPONINI 0.11* 0.08* 0.06*    Glucose Recent Labs  Lab 10/03/17 0748 10/03/17 1128 10/03/17 1527 10/03/17 1947 10/03/17 2353 10/04/17 0349  GLUCAP 209* 128* 129* 168* 199* 178*    Imaging Portable Chest Xray  Result Date: 10/04/2017 CLINICAL DATA:  Encounter for intubation. EXAM: PORTABLE CHEST 1 VIEW COMPARISON:  10/03/2017. FINDINGS: ET tube slightly low, 1.8 cm above carina. Normal cardiomediastinal silhouette. No consolidation or edema. RIGHT base atelectasis, platelike. Orogastric tube tip lies within the stomach or beyond, below the radiographic exposure. IMPRESSION: No consolidation or edema. ET tube slightly low. RIGHT base atelectasis, stable. Electronically Signed   By: Elsie Stain M.D.    On: 10/04/2017 07:22   Portable Chest Xray  Result Date: 10/03/2017 CLINICAL DATA:  ET tube placement EXAM: PORTABLE CHEST 1 VIEW COMPARISON:  10/01/2017 FINDINGS: The endotracheal tube tip is above the carina. NG tube tip is below the GE junction. Aortic atherosclerosis. Normal heart size. No pleural effusion or edema. Atelectasis is noted within the right base. IMPRESSION: 1. Support apparatus as position above. 2. Right base atelectasis. Electronically Signed   By: Signa Kell M.D.   On: 10/03/2017 17:44   Dg Abd Portable 1v  Result Date: 10/03/2017 CLINICAL DATA:  Nasogastric placement. EXAM: PORTABLE ABDOMEN - 1 VIEW COMPARISON:  None. FINDINGS: Nasogastric tube has its tip in the gastric antrum. Gas pattern is normal. IMPRESSION:  Nasogastric tube tip gastric antrum. Electronically Signed   By: Paulina Fusi M.D.   On: 10/03/2017 17:43         STUDIES:  Chest x-ray shows endotracheal tube in place, emphysema   CULTURES: September 26, 2017 respiratory culture September 26, 2017 respiratory viral panel  ANTIBIOTICS: Levaquin d/ced 09/27/17 positive for Influenza A Ceftriaxone 09/30/17---- 10/05/17 for new RLL infiltrate   SIGNIFICANT EVENTS:     LINES/TUBES: January 1 endotracheal tube   DISCUSSION: 67 year old female with a past medical history significant for severe COPD admitted for a COPD exacerbation causing acute respiratory failure with hypoxemia necessitating mechanical ventilation.   ASSESSMENT / PLAN:   PULMONARY A: Acute respiratory failure with hypoxemia COPD exacerbation P:   Solu-Medrol @ 40mg  every 12 hours decreased from 80mg  q6 Albuterol every 4 hours Added pulmicort and brovana Full mechanical vent support VAP prevention Daily WUA/SBT Patient working with vent today no dysynchrony Last ABG showed improvement in hypercapnea and pH Failed extubation reintubated on 10/03/17     CARDIOVASCULAR A:  Sinus tach Some T wave inversions 09/28/17 early morning,   leads 2,3, avf   elevated troponin to 0.15 P:  Telemetry monitoring On diltiazem for HTN only no afib history troponins trending down last was 0.06 Started asa Will need ischemic workup on discharge BP trending up has standing dilt and PRN hydral , mild bradycardia with switch to precedex decreased dilt dose and increase hydral   RENAL A:   No acute issues P:   Cr stable Monitor BMET and UOP Replace electrolytes as needed Will turn off D5W and stick with free water today   GASTROINTESTINAL A:   GERD P:   Pepcid Tube feeding per protocol Colace scheduled    HEMATOLOGIC A:   No acute issues P:  Monitor for bleeding   INFECTIOUS A:   COPD exacerbation P:   Respiratory viral shows influenza A H1 5 days of tamiflu Added 5 days ceftriaxone prophylactically for new RLL infiltrate   ENDOCRINE A:   Some increase in blood glucose due to steroid increase P:   ICU SSI   NEUROLOGIC A:   Acute encephalopathy secondary to intubation/hypercarbic respiratory failure P:   RASS goal: 0- -1 Propofol infusion d/ced switched to precedex due to rising triglycerides,  Fentanyl PRN, clonazepam d/ced, as needed Versed per PAD protocol     FAMILY  - Updates: husband updated yesterday at bedside   - Inter-disciplinary family meet or Palliative Care meeting due by:  day 7     Pulmonary and Critical Care Medicine [facility] Pager: [phone]  10/04/2017, 7:42 AM

## 2017-10-05 ENCOUNTER — Inpatient Hospital Stay (HOSPITAL_COMMUNITY): Payer: Medicare Other

## 2017-10-05 LAB — CBC
HEMATOCRIT: 35.5 % — AB (ref 36.0–46.0)
Hemoglobin: 11.5 g/dL — ABNORMAL LOW (ref 12.0–15.0)
MCH: 30.5 pg (ref 26.0–34.0)
MCHC: 32.4 g/dL (ref 30.0–36.0)
MCV: 94.2 fL (ref 78.0–100.0)
PLATELETS: 229 10*3/uL (ref 150–400)
RBC: 3.77 MIL/uL — ABNORMAL LOW (ref 3.87–5.11)
RDW: 14.1 % (ref 11.5–15.5)
WBC: 20.4 10*3/uL — AB (ref 4.0–10.5)

## 2017-10-05 LAB — BASIC METABOLIC PANEL
Anion gap: 8 (ref 5–15)
BUN: 32 mg/dL — ABNORMAL HIGH (ref 6–20)
CALCIUM: 8.2 mg/dL — AB (ref 8.9–10.3)
CO2: 29 mmol/L (ref 22–32)
CREATININE: 0.57 mg/dL (ref 0.44–1.00)
Chloride: 99 mmol/L — ABNORMAL LOW (ref 101–111)
GFR calc Af Amer: >60 mL/min (ref >60)
Glucose, Bld: 179 mg/dL — ABNORMAL HIGH (ref 65–99)
POTASSIUM: 4.8 mmol/L (ref 3.5–5.1)
SODIUM: 136 mmol/L (ref 135–145)

## 2017-10-05 LAB — GLUCOSE, CAPILLARY
GLUCOSE-CAPILLARY: 123 mg/dL — AB (ref 65–99)
GLUCOSE-CAPILLARY: 182 mg/dL — AB (ref 65–99)
Glucose-Capillary: 167 mg/dL — ABNORMAL HIGH (ref 65–99)
Glucose-Capillary: 123 mg/dL — ABNORMAL HIGH (ref 65–99)
Glucose-Capillary: 98 mg/dL (ref 65–99)
Glucose-Capillary: 151 mg/dL — ABNORMAL HIGH (ref 65–99)

## 2017-10-05 MED ORDER — MIDAZOLAM HCL 2 MG/2ML IJ SOLN
2.0000 mg | Freq: Once | INTRAMUSCULAR | Status: AC
  Administered 2017-10-05: 2 mg via INTRAVENOUS

## 2017-10-05 MED ORDER — MIDAZOLAM HCL 2 MG/2ML IJ SOLN
INTRAMUSCULAR | Status: AC
  Filled 2017-10-05: qty 2

## 2017-10-05 MED ORDER — FREE WATER
250.0000 mL | Freq: Three times a day (TID) | Status: DC
  Administered 2017-10-05 – 2017-10-06 (×2): 250 mL

## 2017-10-05 MED ORDER — FENTANYL CITRATE (PF) 100 MCG/2ML IJ SOLN
50.0000 ug | Freq: Once | INTRAMUSCULAR | Status: AC
  Administered 2017-10-05: 50 ug via INTRAVENOUS

## 2017-10-05 MED ORDER — FENTANYL CITRATE (PF) 100 MCG/2ML IJ SOLN
INTRAMUSCULAR | Status: AC
  Filled 2017-10-05: qty 2

## 2017-10-05 MED ORDER — FENTANYL CITRATE (PF) 100 MCG/2ML IJ SOLN
50.0000 ug | INTRAMUSCULAR | Status: DC | PRN
  Administered 2017-10-05 – 2017-10-10 (×6): 100 ug via INTRAVENOUS
  Filled 2017-10-05 (×6): qty 2

## 2017-10-05 MED ORDER — FENTANYL CITRATE (PF) 100 MCG/2ML IJ SOLN
100.0000 ug | Freq: Once | INTRAMUSCULAR | Status: AC
  Administered 2017-10-05: 100 ug via INTRAVENOUS

## 2017-10-05 NOTE — Progress Notes (Signed)
BS w/ RT and MD. Will not extubate today 2nd to lack of cuff leak and hx. Restarting TF and precedex at reduced rate.

## 2017-10-05 NOTE — Progress Notes (Signed)
PT Cancellation Note  Patient Details Name: Sally [name] MRN: [id number] DOB: Jan 27, 1951   Cancelled Treatment:    Reason Eval/Treat Not Completed: Patient not medically ready. PT signing off. Please re-order when pt is appropriate.   Alessandra Bevels Ulyses Panico 10/05/2017, 2:46 PM

## 2017-10-05 NOTE — Progress Notes (Signed)
This note also relates to the following rows which could not be included: SpO2 - Cannot attach notes to unvalidated device data  Patient placed back on full support for rest and due to sedation medication increased by RN. Patient weaned successfully all day.

## 2017-10-05 NOTE — Progress Notes (Signed)
PULMONARY / CRITICAL CARE MEDICINE   Name: Sally [name] MRN: [id number] DOB: 06/04/1951    ADMISSION DATE:  09/26/2017 CONSULTATION DATE:  09/26/2017  REFERRING MD:  Juleen China  CHIEF COMPLAINT:  dyspnea  HISTORY OF PRESENT ILLNESS:   67 year old female with a past medical history significant for severe COPD came to the emergency department on September 26, 2017 complaining of shortness of breath.  Her husband provides the history because she was intubated on my arrival.  He says that she continues to smoke cigarettes, greater than 1 pack/day.  She uses albuterol about 3 times a day but does not take any other inhalers.  She does not use oxygen.  About a week ago they had a cough or cold and were prescribed a Z-Pak.  She said that her symptoms persisted.  He denied fevers or chills.  She came to the emergency department and was initially treated with BiPAP but she developed worsening respiratory failure and required intubation. Respiratory panel returned influenza A positive   PAST MEDICAL HISTORY :  She  has a past medical history of Asthma, Back pain, COPD (chronic obstructive pulmonary disease) (HCC), Crohn disease (HCC), and Hypertension.  PAST SURGICAL HISTORY: She  has a past surgical history that includes Abdominal hysterectomy; Fracture surgery (Left); and Cataract extraction, bilateral.  No Known Allergies  No current facility-administered medications on file prior to encounter.    Current Outpatient Medications on File Prior to Encounter  Medication Sig  . albuterol (PROVENTIL) (2.5 MG/3ML) 0.083% nebulizer solution Take 2.5 mg by nebulization every 6 (six) hours as needed for wheezing or shortness of breath.   . methylPREDNISolone (MEDROL DOSEPAK) 4 MG TBPK tablet Take by mouth.  Marland Kitchen albuterol (PROVENTIL HFA;VENTOLIN HFA) 108 (90 Base) MCG/ACT inhaler Inhale 1-2 puffs into the lungs every 6 (six) hours as needed for wheezing or shortness of breath.  Ailene Ards ELLIPTA 62.5-25 MCG/INH AEPB  Inhale 1 puff into the lungs daily.   Marland Kitchen aspirin EC 81 MG tablet Take 81 mg by mouth daily.  Marland Kitchen atorvastatin (LIPITOR) 20 MG tablet Take 20 mg by mouth at bedtime.  Marland Kitchen DILT-XR 240 MG 24 hr capsule Take 240 mg by mouth daily.  . DULoxetine (CYMBALTA) 30 MG capsule Take 1 capsule (30 mg total) by mouth daily.  Marland Kitchen FLOVENT HFA 110 MCG/ACT inhaler Inhale 2 puffs into the lungs every 12 (twelve) hours.   . hydrOXYzine (VISTARIL) 25 MG capsule Take 1 capsule (25 mg total) by mouth 3 (three) times daily as needed for anxiety.  . predniSONE (DELTASONE) 20 MG tablet Take 2 tablets (40 mg total) by mouth daily with breakfast.    FAMILY HISTORY:  Her indicated that the status of her other is unknown.   SOCIAL HISTORY: She  reports that she has been smoking.  she has never used smokeless tobacco. She reports that she does not drink alcohol or use drugs.  REVIEW OF SYSTEMS:   Pt intubated  SUBJECTIVE:  Pt with minimal pressure support today, weaned sedation for possible extubation but no air leak around cuff suggestive of edematous airway will wait for at least 24 hours before proceeding.     VITAL SIGNS: BP (!) 129/59   Pulse 75   Temp 98.6 F (37 C) (Oral)   Resp 20   Ht 5\' 1"  (1.549 m)   Wt 172 lb 6.4 oz (78.2 kg)   SpO2 99%   BMI 32.57 kg/m   HEMODYNAMICS:    VENTILATOR SETTINGS: Vent Mode: PRVC FiO2 (%):  [  40 %] 40 % Set Rate:  [18 bmp] 18 bmp Vt Set:  [500 mL] 500 mL PEEP:  [5 cmH20] 5 cmH20 Pressure Support:  [10 cmH20] 10 cmH20 Plateau Pressure:  [15 cmH20-17 cmH20] 16 cmH20  INTAKE / OUTPUT: I/O last 3 completed shifts: In: 3873 [I.V.:2213; NG/GT:1410; IV Piggyback:250] Out: 2177 [Urine:2176; Stool:1]  PHYSICAL EXAMINATION: General:  Sedated, comfortable Neuro:  Sedated, on vent HENT:  ETT in place PULM: Lungs clear today, vent supported breathing CV: RRR, no murmurs, clear S1, S2 GI: BS+, soft, nontender, slightly distended MSK: normal bulk and  tone    LABS:  BMET Recent Labs  Lab 10/02/17 0347 10/03/17 0333 10/04/17 0326  NA 150* 149* 140  K 5.0 4.6 4.8  CL 110 108 102  CO2 31 29 30   BUN 45* 43* 39*  CREATININE 0.64 0.70 0.62  GLUCOSE 228* 196* 186*    Electrolytes Recent Labs  Lab 09/28/17 0542  10/01/17 0225 10/02/17 0347 10/03/17 0333 10/04/17 0326  CALCIUM  --    < > 8.3* 8.3* 8.4* 8.1*  MG 2.6*  --  2.7*  --   --   --   PHOS 3.2  --  3.3  --   --   --    < > = values in this interval not displayed.    CBC Recent Labs  Lab 10/02/17 0347 10/03/17 0333 10/04/17 0326  WBC 17.1* 21.8* 18.7*  HGB 11.5* 11.4* 11.0*  HCT 38.5 38.4 36.4  PLT 297 311 271    Coag's No results for input(s): APTT, INR in the last 168 hours.  Sepsis Markers No results for input(s): LATICACIDVEN, PROCALCITON, O2SATVEN in the last 168 hours.  ABG Recent Labs  Lab 09/29/17 1230 09/30/17 0357 10/03/17 1829  PHART 7.209* 7.384 7.421  PCO2ART 79.0* 57.1* 49.9*  PO2ART 104 132* 104    Liver Enzymes No results for input(s): AST, ALT, ALKPHOS, BILITOT, ALBUMIN in the last 168 hours.  Cardiac Enzymes Recent Labs  Lab 09/28/17 1034 09/28/17 1801 09/29/17 0041  TROPONINI 0.11* 0.08* 0.06*    Glucose Recent Labs  Lab 10/04/17 0735 10/04/17 1142 10/04/17 1528 10/04/17 1952 10/04/17 2325 10/05/17 0349  GLUCAP 206* 153* 114* 153* 173* 182*    Imaging Portable Chest Xray  Result Date: 10/04/2017 CLINICAL DATA:  Encounter for intubation. EXAM: PORTABLE CHEST 1 VIEW COMPARISON:  10/03/2017. FINDINGS: ET tube slightly low, 1.8 cm above carina. Normal cardiomediastinal silhouette. No consolidation or edema. RIGHT base atelectasis, platelike. Orogastric tube tip lies within the stomach or beyond, below the radiographic exposure. IMPRESSION: No consolidation or edema. ET tube slightly low. RIGHT base atelectasis, stable. Electronically Signed   By: Elsie Stain M.D.   On: 10/04/2017 07:22         STUDIES:   Chest x-ray shows endotracheal tube in place, emphysema   CULTURES: September 26, 2017 respiratory culture September 26, 2017 respiratory viral panel  ANTIBIOTICS: Levaquin d/ced 09/27/17 positive for Influenza A Ceftriaxone 09/30/17---- 10/05/17 for new RLL infiltrate   SIGNIFICANT EVENTS:     LINES/TUBES: January 1 endotracheal tube   DISCUSSION: 67 year old female with a past medical history significant for severe COPD admitted for a COPD exacerbation causing acute respiratory failure with hypoxemia necessitating mechanical ventilation.   ASSESSMENT / PLAN:   PULMONARY A: Acute respiratory failure with hypoxemia COPD exacerbation P:   Solu-Medrol @ 40mg  every 12 hours decreased from 80mg  q6 Albuterol every 4 hours Added pulmicort and brovana Full mechanical vent support VAP  prevention Daily WUA/SBT Patient working with vent today no dysynchrony Last ABG showed improvement in hypercapnea and pH Failed extubation reintubated on 10/03/17 Was going to extubate on 1/10 due to good air movement with minimal pressure support however no air leak in cuff suggestive of edematous airway will wait at least 24 hours     CARDIOVASCULAR A:  Sinus tach Some T wave inversions 09/28/17 early morning,  leads 2,3, avf   elevated troponin to 0.15 P:  Telemetry monitoring On diltiazem for HTN only no afib history troponins trending down last was 0.06 Started asa Will need ischemic workup on discharge BP stable on dilt and PRN hydral , mild bradycardia with switch to precedex decreased dilt dose and increased hydral    RENAL A:   No acute issues P:   Cr stable Monitor BMET and UOP Replace electrolytes as needed Hypernatremia resolved Decreased free water some today   GASTROINTESTINAL A:   GERD P:   Pepcid Tube feeding per protocol Colace scheduled    HEMATOLOGIC A:   No acute issues P:  Monitor for bleeding   INFECTIOUS A:   COPD exacerbation P:   Respiratory viral shows  influenza A H1 5 days of tamiflu Added 5 days ceftriaxone prophylactically for new RLL infiltrate   ENDOCRINE A:   Some increase in blood glucose due to steroid increase P:   SSI + 10 u levemir   NEUROLOGIC A:   Intubated, sedated P:   RASS goal: 0- -1 Propofol infusion d/ced switched to precedex due to rising triglycerides,  Fentanyl PRN, clonazepam d/ced, as needed Versed per PAD protocol     FAMILY  - Updates: husband updated yesterday at bedside   - Inter-disciplinary family meet or Palliative Care meeting due by:  day 7     Pulmonary and Critical Care Medicine [facility] Pager: [phone]  10/05/2017, 5:23 AM

## 2017-10-05 NOTE — Progress Notes (Signed)
RT found PS at 5.

## 2017-10-06 ENCOUNTER — Inpatient Hospital Stay (HOSPITAL_COMMUNITY): Payer: Medicare Other

## 2017-10-06 LAB — BLOOD GAS, ARTERIAL
Acid-Base Excess: 4.4 mmol/L — ABNORMAL HIGH (ref 0.0–2.0)
Bicarbonate: 28.5 mmol/L — ABNORMAL HIGH (ref 20.0–28.0)
Drawn by: 36527
FIO2: 100
O2 Saturation: 97.8 %
PCO2 ART: 45.8 mmHg (ref 32.0–48.0)
PEEP: 5 cmH2O
Patient temperature: 100.9
RATE: 18 resp/min
VT: 500 mL
pH, Arterial: 7.417 (ref 7.350–7.450)
pO2, Arterial: 124 mmHg — ABNORMAL HIGH (ref 83.0–108.0)

## 2017-10-06 LAB — BASIC METABOLIC PANEL
Anion gap: 7 (ref 5–15)
BUN: 29 mg/dL — ABNORMAL HIGH (ref 6–20)
CHLORIDE: 100 mmol/L — AB (ref 101–111)
CO2: 28 mmol/L (ref 22–32)
CREATININE: 0.52 mg/dL (ref 0.44–1.00)
Calcium: 8.1 mg/dL — ABNORMAL LOW (ref 8.9–10.3)
GFR calc non Af Amer: >60 mL/min (ref >60)
Glucose, Bld: 189 mg/dL — ABNORMAL HIGH (ref 65–99)
POTASSIUM: 4.8 mmol/L (ref 3.5–5.1)
Sodium: 135 mmol/L (ref 135–145)

## 2017-10-06 LAB — GLUCOSE, CAPILLARY
GLUCOSE-CAPILLARY: 144 mg/dL — AB (ref 65–99)
GLUCOSE-CAPILLARY: 108 mg/dL — AB (ref 65–99)
Glucose-Capillary: 187 mg/dL — ABNORMAL HIGH (ref 65–99)
Glucose-Capillary: 163 mg/dL — ABNORMAL HIGH (ref 65–99)
Glucose-Capillary: 159 mg/dL — ABNORMAL HIGH (ref 65–99)
Glucose-Capillary: 173 mg/dL — ABNORMAL HIGH (ref 65–99)

## 2017-10-06 LAB — CBC
HEMATOCRIT: 34.8 % — AB (ref 36.0–46.0)
Hemoglobin: 11 g/dL — ABNORMAL LOW (ref 12.0–15.0)
MCH: 29.3 pg (ref 26.0–34.0)
MCHC: 31.6 g/dL (ref 30.0–36.0)
MCV: 92.6 fL (ref 78.0–100.0)
PLATELETS: 205 10*3/uL (ref 150–400)
RBC: 3.76 MIL/uL — AB (ref 3.87–5.11)
RDW: 13.7 % (ref 11.5–15.5)
WBC: 22.9 10*3/uL — ABNORMAL HIGH (ref 4.0–10.5)

## 2017-10-06 LAB — MAGNESIUM: MAGNESIUM: 2.2 mg/dL (ref 1.7–2.4)

## 2017-10-06 LAB — PHOSPHORUS: Phosphorus: 3.8 mg/dL (ref 2.5–4.6)

## 2017-10-06 MED ORDER — PRO-STAT SUGAR FREE PO LIQD
30.0000 mL | Freq: Two times a day (BID) | ORAL | Status: DC
  Administered 2017-10-06 – 2017-10-07 (×2): 30 mL
  Filled 2017-10-06 (×2): qty 30

## 2017-10-06 MED ORDER — SODIUM CHLORIDE 0.9 % IV SOLN
0.0000 ug/h | INTRAVENOUS | Status: DC
  Administered 2017-10-06 – 2017-10-07 (×2): 200 ug/h via INTRAVENOUS
  Filled 2017-10-06 (×3): qty 50

## 2017-10-06 MED ORDER — FENTANYL CITRATE (PF) 100 MCG/2ML IJ SOLN: 100.0000 ug | Freq: Once | INTRAMUSCULAR | Status: DC

## 2017-10-06 MED ORDER — VITAL HIGH PROTEIN PO LIQD
1000.0000 mL | ORAL | Status: DC
  Administered 2017-10-06 – 2017-10-12 (×6): 1000 mL

## 2017-10-06 MED ORDER — MIDAZOLAM HCL 2 MG/2ML IJ SOLN
INTRAMUSCULAR | Status: AC
  Administered 2017-10-06: 16:00:00
  Filled 2017-10-06: qty 2

## 2017-10-06 MED ORDER — VITAL HIGH PROTEIN PO LIQD: 1000.0000 mL | ORAL | Status: DC

## 2017-10-06 MED ORDER — CHLORHEXIDINE GLUCONATE 0.12% ORAL RINSE (MEDLINE KIT)
15.0000 mL | Freq: Two times a day (BID) | OROMUCOSAL | Status: DC
  Administered 2017-10-06 – 2017-10-10 (×8): 15 mL via OROMUCOSAL

## 2017-10-06 MED ORDER — FREE WATER
150.0000 mL | Freq: Three times a day (TID) | Status: DC
  Administered 2017-10-06 – 2017-10-08 (×5): 150 mL

## 2017-10-06 MED ORDER — ORAL CARE MOUTH RINSE
15.0000 mL | Freq: Four times a day (QID) | OROMUCOSAL | Status: DC
  Administered 2017-10-06 – 2017-10-10 (×17): 15 mL via OROMUCOSAL

## 2017-10-06 MED ORDER — ETOMIDATE 2 MG/ML IV SOLN: 0.3000 mg/kg | Freq: Once | INTRAVENOUS | Status: DC

## 2017-10-06 MED ORDER — ETOMIDATE 2 MG/ML IV SOLN
0.3000 mg/kg | Freq: Once | INTRAVENOUS | Status: AC
  Administered 2017-10-06: 30 mg via INTRAVENOUS
  Filled 2017-10-06: qty 11.57

## 2017-10-06 MED ORDER — SODIUM CHLORIDE 0.9 % IV SOLN
0.0000 ug/kg/h | INTRAVENOUS | Status: DC
  Administered 2017-10-07: 0.8 ug/kg/h via INTRAVENOUS
  Administered 2017-10-07 (×2): 1.2 ug/kg/h via INTRAVENOUS
  Administered 2017-10-07: 0.9 ug/kg/h via INTRAVENOUS
  Administered 2017-10-07 (×2): 1.2 ug/kg/h via INTRAVENOUS
  Filled 2017-10-06 (×8): qty 2

## 2017-10-06 MED ORDER — FENTANYL CITRATE (PF) 100 MCG/2ML IJ SOLN: 50.0000 ug | INTRAMUSCULAR | Status: DC | PRN

## 2017-10-06 MED ORDER — FENTANYL CITRATE (PF) 100 MCG/2ML IJ SOLN
INTRAMUSCULAR | Status: AC
  Administered 2017-10-06: 16:00:00
  Filled 2017-10-06: qty 2

## 2017-10-06 NOTE — Procedures (Signed)
Extubation Procedure Note  Patient Details:   Name: Sally [name] DOB: 1951-01-06 MRN: [id number]   Airway Documentation:  Airway 7 mm (Active)  Secured at (cm) 23 cm 10/06/2017  8:08 AM  Measured From Lips 10/06/2017  8:08 AM  Secured Location Right 10/06/2017  8:08 AM  Secured By [employer] 10/06/2017  8:08 AM  Tube Holder Repositioned Yes 10/06/2017  8:08 AM  Cuff Pressure (cm H2O) 26 cm H2O 10/06/2017  8:08 AM  Site Condition Dry 10/06/2017  8:08 AM    Evaluation  O2 sats: stable throughout Complications: No apparent complications Patient did tolerate procedure well. Bilateral Breath Sounds: Clear, Diminished   Yes  Patient had a slight cuff leak and Dr. Vassie Loll was made aware. Patient was able to softly speak name, no stridor noted at this time. Patient was extubated to BIPAP 10/5. Tolerating well at this time. BBS crackles. Scheduled breathing treatment given at this time. RT will continue to monitor.   Vinicio Lynk M 10/06/2017, 11:08 AM

## 2017-10-06 NOTE — Progress Notes (Signed)
PAtient was intubated with CCM at bedside with 100 of fentanyl, 4 of versed, and 30 of etomidate. RRT and myself at bedside and will continue to monitor. Tamsen Meek, RN 10/06/2017 3:42 PM

## 2017-10-06 NOTE — Procedures (Signed)
Intubation Procedure Note Sally [name] [id number] [birth year]  Procedure: Intubation Indications: Respiratory insufficiency  Procedure Details Consent: Risks of procedure as well as the alternatives and risks of each were explained to the (patient/caregiver).  Consent for procedure obtained. Time Out: Verified patient identification, verified procedure, site/side was marked, verified correct patient position, special equipment/implants available, medications/allergies/relevent history reviewed, required imaging and test results available.  Performed  Maximum sterile technique was used including cap, gloves, gown, hand hygiene and mask.  MAC and 4  Versed 4 fent 200 mcg Etomidate 30 - in divided doses  Evaluation Hemodynamic Status: BP stable throughout; O2 sats: stable throughout Patient's Current Condition: stable Complications: No apparent complications Patient did tolerate procedure well. 7.0 ETT placed Chest X-ray ordered to verify placement.  CXR: pending.   Sally [name] Elsworth Soho 10/06/2017

## 2017-10-06 NOTE — Progress Notes (Signed)
ETT was at 24.5 at the lip. Per CXR and Alva pull back ETT 4.5. ETT now at 21 at the lip. Repeat CXR ordered.

## 2017-10-06 NOTE — Progress Notes (Signed)
Attempted nasogastric tube twice. Once with blood coming out of nose and the other coiled in mouth. [facility] team not available today, paged with no answer so orogastric tube was placed. Huntley Estelle E, RN 10/06/2017 4:20 PM

## 2017-10-06 NOTE — Progress Notes (Signed)
PULMONARY / CRITICAL CARE MEDICINE   Name: Ashtin Melichar MRN: [id number] DOB: 05/27/1951    ADMISSION DATE:  09/26/2017 CONSULTATION DATE:  09/26/2017  REFERRING MD:  Juleen China  CHIEF COMPLAINT:  dyspnea  HISTORY OF PRESENT ILLNESS:   67 year old female with a past medical history significant for severe COPD came to the emergency department on September 26, 2017 complaining of shortness of breath.  Her husband provides the history because she was intubated on my arrival.  He says that she continues to smoke cigarettes, greater than 1 pack/day.  She uses albuterol about 3 times a day but does not take any other inhalers.  She does not use oxygen.  About a week ago they had a cough or cold and were prescribed a Z-Pak.  She said that her symptoms persisted.  He denied fevers or chills.  She came to the emergency department and was initially treated with BiPAP but she developed worsening respiratory failure and required intubation. Respiratory panel returned influenza A positive   PAST MEDICAL HISTORY :  She  has a past medical history of Asthma, Back pain, COPD (chronic obstructive pulmonary disease) (HCC), Crohn disease (HCC), and Hypertension.  PAST SURGICAL HISTORY: She  has a past surgical history that includes Abdominal hysterectomy; Fracture surgery (Left); and Cataract extraction, bilateral.  No Known Allergies  No current facility-administered medications on file prior to encounter.    Current Outpatient Medications on File Prior to Encounter  Medication Sig  . albuterol (PROVENTIL) (2.5 MG/3ML) 0.083% nebulizer solution Take 2.5 mg by nebulization every 6 (six) hours as needed for wheezing or shortness of breath.   . methylPREDNISolone (MEDROL DOSEPAK) 4 MG TBPK tablet Take by mouth.  Marland Kitchen albuterol (PROVENTIL HFA;VENTOLIN HFA) 108 (90 Base) MCG/ACT inhaler Inhale 1-2 puffs into the lungs every 6 (six) hours as needed for wheezing or shortness of breath.  Ailene Ards ELLIPTA 62.5-25 MCG/INH AEPB  Inhale 1 puff into the lungs daily.   Marland Kitchen aspirin EC 81 MG tablet Take 81 mg by mouth daily.  Marland Kitchen atorvastatin (LIPITOR) 20 MG tablet Take 20 mg by mouth at bedtime.  Marland Kitchen DILT-XR 240 MG 24 hr capsule Take 240 mg by mouth daily.  . DULoxetine (CYMBALTA) 30 MG capsule Take 1 capsule (30 mg total) by mouth daily.  Marland Kitchen FLOVENT HFA 110 MCG/ACT inhaler Inhale 2 puffs into the lungs every 12 (twelve) hours.   . hydrOXYzine (VISTARIL) 25 MG capsule Take 1 capsule (25 mg total) by mouth 3 (three) times daily as needed for anxiety.  . predniSONE (DELTASONE) 20 MG tablet Take 2 tablets (40 mg total) by mouth daily with breakfast.    FAMILY HISTORY:  Her indicated that the status of her other is unknown.   SOCIAL HISTORY: She  reports that she has been smoking.  she has never used smokeless tobacco. She reports that she does not drink alcohol or use drugs.  REVIEW OF SYSTEMS:   Pt intubated  SUBJECTIVE:  Pt with minimal pressure support today, weaned sedation for possible extubation, air leak better extubated to bipap  VITAL SIGNS: BP 126/63   Pulse 70   Temp 98.3 F (36.8 C) (Oral)   Resp 19   Ht 5\' 1"  (1.549 m)   Wt 169 lb 15.6 oz (77.1 kg)   SpO2 98%   BMI 32.12 kg/m   HEMODYNAMICS:    VENTILATOR SETTINGS: Vent Mode: PRVC FiO2 (%):  [40 %] 40 % Set Rate:  [18 bmp] 18 bmp Vt Set:  [500  mL] 500 mL PEEP:  [5 cmH20] 5 cmH20 Pressure Support:  [5 cmH20-10 cmH20] 8 cmH20 Plateau Pressure:  [14 cmH20-22 cmH20] 15 cmH20  INTAKE / OUTPUT: I/O last 3 completed shifts: In: 4480 [I.V.:1160; Other:250; NG/GT:2970; IV Piggyback:100] Out: 3551 [Urine:2800; Emesis/NG output:750; Stool:1]  PHYSICAL EXAMINATION: General:  Sedated, comfortable Neuro:  Sedated, on vent HENT:  ETT in place PULM: Lungs clear today, vent supported breathing CV: RRR, no murmurs, clear S1, S2 GI: BS+, soft, nontender, slightly distended MSK: normal bulk and tone    LABS:  BMET Recent Labs  Lab 10/04/17 0326  10/05/17 0505 10/06/17 0245  NA 140 136 135  K 4.8 4.8 4.8  CL 102 99* 100*  CO2 30 29 28   BUN 39* 32* 29*  CREATININE 0.62 0.57 0.52  GLUCOSE 186* 179* 189*    Electrolytes Recent Labs  Lab 10/01/17 0225  10/04/17 0326 10/05/17 0505 10/06/17 0245  CALCIUM 8.3*   < > 8.1* 8.2* 8.1*  MG 2.7*  --   --   --   --   PHOS 3.3  --   --   --   --    < > = values in this interval not displayed.    CBC Recent Labs  Lab 10/04/17 0326 10/05/17 0505 10/06/17 0245  WBC 18.7* 20.4* 22.9*  HGB 11.0* 11.5* 11.0*  HCT 36.4 35.5* 34.8*  PLT 271 229 205    Coag's No results for input(s): APTT, INR in the last 168 hours.  Sepsis Markers No results for input(s): LATICACIDVEN, PROCALCITON, O2SATVEN in the last 168 hours.  ABG Recent Labs  Lab 09/29/17 1230 09/30/17 0357 10/03/17 1829  PHART 7.209* 7.384 7.421  PCO2ART 79.0* 57.1* 49.9*  PO2ART 104 132* 104    Liver Enzymes No results for input(s): AST, ALT, ALKPHOS, BILITOT, ALBUMIN in the last 168 hours.  Cardiac Enzymes No results for input(s): TROPONINI, PROBNP in the last 168 hours.  Glucose Recent Labs  Lab 10/05/17 0729 10/05/17 1117 10/05/17 1531 10/05/17 2000 10/05/17 2358 10/06/17 0351  GLUCAP 167* 123* 98 151* 123* 187*    Imaging No results found.       STUDIES:  Chest x-ray shows endotracheal tube in place, emphysema   CULTURES: September 26, 2017 respiratory culture September 26, 2017 respiratory viral panel  ANTIBIOTICS: Levaquin d/ced 09/27/17 positive for Influenza A Ceftriaxone 09/30/17---- 10/05/17 for new RLL infiltrate   SIGNIFICANT EVENTS:     LINES/TUBES: January 1 endotracheal tube   DISCUSSION: 67 year old female with a past medical history significant for severe COPD admitted for a COPD exacerbation causing acute respiratory failure with hypoxemia necessitating mechanical ventilation.   ASSESSMENT / PLAN:   PULMONARY A: Acute respiratory failure with hypoxemia COPD  exacerbation P:   Solu-Medrol @ 40mg  every 12 hours decreased from 80mg  q6 Albuterol every 4 hours Added pulmicort and brovana Full mechanical vent support VAP prevention Daily WUA/SBT Patient working with vent today no dysynchrony Last ABG showed improvement in hypercapnea and pH Failed extubation reintubated on 10/03/17 Was going to extubate on 1/10 due to good air movement with minimal pressure support however no air leak in cuff suggestive of edematous airway will wait at least 24 hours Air leak better today, extubated to bipap     CARDIOVASCULAR A:  Sinus tach Some T wave inversions 09/28/17 early morning,  leads 2,3, avf   elevated troponin to 0.15 P:  Telemetry monitoring On diltiazem for HTN only no afib history troponins trending down last was  0.06 Started asa Will need ischemic workup on discharge BP stable on dilt and PRN hydral , mild bradycardia with switch to precedex decreased dilt dose and increased hydral    RENAL A:   No acute issues P:   Cr stable Monitor BMET and UOP Replace electrolytes as needed Hypernatremia resolved Decreased free water some today   GASTROINTESTINAL A:   GERD P:   Pepcid Tube feeding per protocol Colace scheduled    HEMATOLOGIC A:   No acute issues P:  Monitor for bleeding   INFECTIOUS A:   COPD exacerbation P:   Respiratory viral shows influenza A H1 5 days of tamiflu Added 5 days ceftriaxone prophylactically for new RLL infiltrate now resolved   ENDOCRINE A:   Some increase in blood glucose due to steroid increase P:   SSI + 10 u levemir   NEUROLOGIC A:   Extubated now following commands P:   NTD     FAMILY  - Updates: will update family on extubation   - Inter-disciplinary family meet or Palliative Care meeting due by:  day 7     Pulmonary and Critical Care Medicine [facility] Pager: [phone]  10/06/2017, 7:42 AM

## 2017-10-07 ENCOUNTER — Inpatient Hospital Stay (HOSPITAL_COMMUNITY): Payer: Medicare Other

## 2017-10-07 DIAGNOSIS — I10 Essential (primary) hypertension: Secondary | ICD-10-CM

## 2017-10-07 LAB — GLUCOSE, CAPILLARY
GLUCOSE-CAPILLARY: 161 mg/dL — AB (ref 65–99)
Glucose-Capillary: 127 mg/dL — ABNORMAL HIGH (ref 65–99)
Glucose-Capillary: 195 mg/dL — ABNORMAL HIGH (ref 65–99)
Glucose-Capillary: 189 mg/dL — ABNORMAL HIGH (ref 65–99)
Glucose-Capillary: 177 mg/dL — ABNORMAL HIGH (ref 65–99)

## 2017-10-07 LAB — BASIC METABOLIC PANEL
ANION GAP: 8 (ref 5–15)
BUN: 41 mg/dL — ABNORMAL HIGH (ref 6–20)
CALCIUM: 8.1 mg/dL — AB (ref 8.9–10.3)
CO2: 27 mmol/L (ref 22–32)
CREATININE: 0.69 mg/dL (ref 0.44–1.00)
Chloride: 103 mmol/L (ref 101–111)
GFR calc non Af Amer: >60 mL/min (ref >60)
Glucose, Bld: 166 mg/dL — ABNORMAL HIGH (ref 65–99)
Potassium: 4.2 mmol/L (ref 3.5–5.1)
SODIUM: 138 mmol/L (ref 135–145)

## 2017-10-07 LAB — CBC
HCT: 33.9 % — ABNORMAL LOW (ref 36.0–46.0)
HEMOGLOBIN: 10.9 g/dL — AB (ref 12.0–15.0)
MCH: 29.9 pg (ref 26.0–34.0)
MCHC: 32.2 g/dL (ref 30.0–36.0)
MCV: 93.1 fL (ref 78.0–100.0)
PLATELETS: 196 10*3/uL (ref 150–400)
RBC: 3.64 MIL/uL — AB (ref 3.87–5.11)
RDW: 14.2 % (ref 11.5–15.5)
WBC: 29.6 10*3/uL — ABNORMAL HIGH (ref 4.0–10.5)

## 2017-10-07 LAB — MAGNESIUM: MAGNESIUM: 2.3 mg/dL (ref 1.7–2.4)

## 2017-10-07 LAB — PHOSPHORUS: Phosphorus: 3.9 mg/dL (ref 2.5–4.6)

## 2017-10-07 LAB — PROCALCITONIN: Procalcitonin: 0.25 ng/mL

## 2017-10-07 MED ORDER — PRO-STAT SUGAR FREE PO LIQD
30.0000 mL | Freq: Every day | ORAL | Status: DC
  Administered 2017-10-08 – 2017-10-13 (×4): 30 mL
  Filled 2017-10-07 (×7): qty 30

## 2017-10-07 MED ORDER — FENTANYL 2500MCG IN NS 250ML (10MCG/ML) PREMIX INFUSION
0.0000 ug/h | INTRAVENOUS | Status: DC
  Administered 2017-10-07 – 2017-10-08 (×3): 100 ug/h via INTRAVENOUS
  Administered 2017-10-09: 50 ug/h via INTRAVENOUS
  Filled 2017-10-07 (×3): qty 250

## 2017-10-07 MED ORDER — DEXMEDETOMIDINE HCL IN NACL 400 MCG/100ML IV SOLN
0.4000 ug/kg/h | INTRAVENOUS | Status: DC
  Administered 2017-10-07 – 2017-10-08 (×6): 1.2 ug/kg/h via INTRAVENOUS
  Administered 2017-10-09 (×2): 0.8 ug/kg/h via INTRAVENOUS
  Administered 2017-10-09: 0.6 ug/kg/h via INTRAVENOUS
  Administered 2017-10-09 (×2): 1.2 ug/kg/h via INTRAVENOUS
  Administered 2017-10-10: 0.8 ug/kg/h via INTRAVENOUS
  Filled 2017-10-07 (×12): qty 100

## 2017-10-07 NOTE — Progress Notes (Signed)
Nutrition Follow-up  DOCUMENTATION CODES:  Not applicable  INTERVENTION:  Re-start   Vital High Protein at 40 ml/h (960 ml per day)  Pro-stat 30 ml once daily  Provides 1060 kcal, 99 gm protein, 803 ml free water daily  Receiving additional 450 mls free water w/ FWF  NUTRITION DIAGNOSIS:  Inadequate oral intake related to inability to eat as evidenced by NPO status.  Onoging  GOAL:  Provide needs based on ASPEN/SCCM guidelines  MET  MONITOR:  Vent status, TF tolerance, Labs, I & O's  REASON FOR ASSESSMENT:  Consult Enteral/tube feeding initiation and management  ASSESSMENT:  67 yo female with PMH of COPD, HTN, Crohn's DZ, asthma who was admitted on 1/1 with COPD exacerbation, requiring intubation.  Intubation 1/1-1/8 (extubated/quickly re intubated) 1/8-1/11 (extubated/quickly re intubated) 1/11 -> Present.   Failed extubation yesterday. Reintubated. To resume TF.   Patient had been ordered for cortrak yesterday, however it was after hours and an OGT was placed. Called and spoke with RN who felt cortrak not needed at this time. D/Cd c/s.   Pt has new dry weight of 162 lbs 4 oz. -readjusted kcal/pro needs  Patient is currently intubated on ventilator support MV: 8.0 L/min Temp (24hrs), Avg:99.1 F (37.3 C), Min:97.9 F (36.6 C), Max:100.3 F (37.9 C) Propofol: None. Sedated w/ fentanyl/precedex  Labs: BGs 127-195, WBC:29.6 meds : Vital high Protein and Prostat, Free Water 150 ml q 8, insulin, Methylprednisolone, senokot, IVF, Precedex/fentanyl, IV H2RA,   Recent Labs  Lab 10/01/17 0225  10/05/17 0505 10/06/17 0245 10/06/17 1708 10/07/17 0713  NA 149*   < > 136 135  --  138  K 4.9   < > 4.8 4.8  --  4.2  CL 110   < > 99* 100*  --  103  CO2 33*   < > 29 28  --  27  BUN 41*   < > 32* 29*  --  41*  CREATININE 0.73   < > 0.57 0.52  --  0.69  CALCIUM 8.3*   < > 8.2* 8.1*  --  8.1*  MG 2.7*  --   --   --  2.2  --   PHOS 3.3  --   --   --  3.8  --    GLUCOSE 211*   < > 179* 189*  --  166*   < > = values in this interval not displayed.   Diet Order:  Diet NPO time specified  EDUCATION NEEDS:  No education needs have been identified at this time  Skin:  MSAD to Sacrum  Last BM:  1/12  Height:  Ht Readings from Last 1 Encounters:  09/26/17 _0  (1.549 m)   Weight:  Wt Readings from Last 1 Encounters:  10/07/17 162 lb 4.1 oz (73.6 kg)   Wt Readings from Last 10 Encounters:  10/07/17 162 lb 4.1 oz (73.6 kg)  10/06/16 165 lb 6.4 oz (75 kg)  07/14/16 168 lb 3.4 oz (76.3 kg)    Ideal Body Weight:  47.7 kg  BMI:  Body mass index is 30.66 kg/m.  Estimated Nutritional Needs:  Kcal:  807 095 2822 kcals (11-14 kcal/kg bw) Protein:  95 gm Pro (2g/kg ibw) Fluid:  Per MD  Burtis Junes RD, LDN, CNSC Clinical Nutrition Pager: [phone] 10/07/2017 11:27 AM

## 2017-10-07 NOTE — Progress Notes (Addendum)
PULMONARY / CRITICAL CARE MEDICINE   Name: Sally [name] MRN: [id number] DOB: 10-24-1950    ADMISSION DATE:  09/26/2017 CONSULTATION DATE:  09/26/2017  REFERRING MD:  Juleen China  CHIEF COMPLAINT:  dyspnea  HISTORY OF PRESENT ILLNESS:   67 year old female with a past medical history significant for severe COPD came to the emergency department on September 26, 2017 complaining of shortness of breath.  Her husband provides the history because she was intubated on my arrival.  He says that she continues to smoke cigarettes, greater than 1 pack/day.  She uses albuterol about 3 times a day but does not take any other inhalers.  She does not use oxygen.  About a week ago they had a cough or cold and were prescribed a Z-Pak.  She said that her symptoms persisted.  He denied fevers or chills.  She came to the emergency department and was initially treated with BiPAP but she developed worsening respiratory failure and required intubation. Respiratory panel returned influenza A positive  SUBJECTIVE:  Awake, cycling on pressure support, failed extubation yesterday  VITAL SIGNS: BP (!) 95/51   Pulse 79   Temp 97.9 F (36.6 C) (Oral)   Resp 18   Ht 5\' 1"  (1.549 m)   Wt 162 lb 4.1 oz (73.6 kg)   SpO2 96%   BMI 30.66 kg/m    HEMODYNAMICS:    VENTILATOR SETTINGS: Vent Mode: PSV;CPAP FiO2 (%):  [40 %-100 %] 40 % Set Rate:  [8 bmp-18 bmp] 18 bmp Vt Set:  [500 mL] 500 mL PEEP:  [5 cmH20] 5 cmH20 Pressure Support:  [5 cmH20] 5 cmH20 Plateau Pressure:  [14 cmH20-22 cmH20] 14 cmH20  INTAKE / OUTPUT: I/O last 3 completed shifts: In: 2915.9 [I.V.:885.9; Other:250; WU/JW:1191; IV Piggyback:100] Out: 4782 [NFAOZ:3086; Emesis/NG output:750]  PHYSICAL EXAMINATION: General: This is a chronically ill appearing white female currently sedated on the ventilator, she is on pressure support ventilation and appears somewhat labored. HEENT: Orally intubated normocephalic atraumatic mucous membranes moist Pulmonary:  Prolonged expiratory phase, some cycling on pressure support with tidal volumes in the 4-500s, does have accessory use with this. Cardiac: Regular rate and rhythm without murmur light soft nontender no organomegaly Extremities/muscular skeletal: Equal strength bulk but no significant edema brisk cap refill, warm. Neuro: Awake, follows commands, interactive   LABS:  BMET Recent Labs  Lab 10/05/17 0505 10/06/17 0245 10/07/17 0713  NA 136 135 138  K 4.8 4.8 4.2  CL 99* 100* 103  CO2 29 28 27   BUN 32* 29* 41*  CREATININE 0.57 0.52 0.69  GLUCOSE 179* 189* 166*    Electrolytes Recent Labs  Lab 10/01/17 0225  10/05/17 0505 10/06/17 0245 10/06/17 1708 10/07/17 0713  CALCIUM 8.3*   < > 8.2* 8.1*  --  8.1*  MG 2.7*  --   --   --  2.2  --   PHOS 3.3  --   --   --  3.8  --    < > = values in this interval not displayed.    CBC Recent Labs  Lab 10/05/17 0505 10/06/17 0245 10/07/17 0713  WBC 20.4* 22.9* 29.6*  HGB 11.5* 11.0* 10.9*  HCT 35.5* 34.8* 33.9*  PLT 229 205 196    Coag's No results for input(s): APTT, INR in the last 168 hours.  Sepsis Markers No results for input(s): LATICACIDVEN, PROCALCITON, O2SATVEN in the last 168 hours.  ABG Recent Labs  Lab 10/03/17 1829 10/06/17 1610  PHART 7.421 7.417  PCO2ART 49.9*  45.8  PO2ART 104 124*    Liver Enzymes No results for input(s): AST, ALT, ALKPHOS, BILITOT, ALBUMIN in the last 168 hours.  Cardiac Enzymes No results for input(s): TROPONINI, PROBNP in the last 168 hours.  Glucose Recent Labs  Lab 10/06/17 1133 10/06/17 1610 10/06/17 1936 10/06/17 2319 10/07/17 0323 10/07/17 0813  GLUCAP 108* 163* 159* 173* 127* 195*    Imaging Portable Chest Xray  Result Date: 10/07/2017 CLINICAL DATA:  Respiratory failure EXAM: PORTABLE CHEST 1 VIEW COMPARISON:  10/06/2017 FINDINGS: Endotracheal tube is unchanged. Interval placement of NG tube into the stomach. Improving opacity at the right lung base. Minimal  residual bibasilar opacities. Heart is normal size. No effusions. IMPRESSION: Improving aeration at the right lung base. Minimal residual atelectasis or infiltrates in the bases. Electronically Signed   By: Charlett Nose M.D.   On: 10/07/2017 07:46   Dg Chest Port 1 View  Result Date: 10/06/2017 CLINICAL DATA:  Endotracheally intubated. EXAM: PORTABLE CHEST 1 VIEW COMPARISON:  Earlier today at 1521 hours FINDINGS: 1558 hours. Endotracheal tube has been retracted and terminates 2.4 cm above the carina. Midline trachea. Normal heart size. Atherosclerosis in the transverse aorta. Minimal exclusion of the left costophrenic angle. No pleural effusion or pneumothorax. Developing right base airspace disease. IMPRESSION: Appropriate position of endotracheal tube, 2.4 cm above carina. Developing right base airspace disease, suspicious for aspiration. Progressive atelectasis could look similar. Electronically Signed   By: Jeronimo Greaves M.D.   On: 10/06/2017 16:14   Dg Chest Port 1 View  Result Date: 10/06/2017 CLINICAL DATA:  Endotracheal tube placement EXAM: PORTABLE CHEST 1 VIEW COMPARISON:  10/05/2017 FINDINGS: The endotracheal tube tip is in the right mainstem bronchus. Retraction of 4.5 cm is recommended. The prior plate like atelectasis at the right lung base has resolved. Mild interstitial accentuation, as can commonly be encountered in smokers. Atherosclerotic calcification of the aortic arch. Heart size within normal limits. No airspace opacity identified. IMPRESSION: 1. The endotracheal tube tip is in the right mainstem bronchus. Retraction of 4.5 cm recommended. 2.  Aortic Atherosclerosis (ICD10-I70.0). Critical Value/emergent results were called by telephone at the time of interpretation on 10/06/2017 at 3:45 pm to clinical team member Melvyn Neth, who verbally acknowledged these results. Electronically Signed   By: Gaylyn Rong M.D.   On: 10/06/2017 15:47   Dg Abd Portable 1v  Result Date:  10/06/2017 CLINICAL DATA:  Orogastric tube placement. EXAM: PORTABLE ABDOMEN - 1 VIEW COMPARISON:  10/03/2017 FINDINGS: Sub pathologic gaseous distension and mild mucosal thickening of small bowel loops in the mid abdomen. No evidence of organomegaly. Enteric catheter with tip in the expected location of gastric antrum. IMPRESSION: Enteric catheter with tip in the expected location of gastric antrum. Mild mucosal thickening of sub pathologically distended small bowel loops in the mid abdomen may be seen with enteritis. Electronically Signed   By: Ted Mcalpine M.D.   On: 10/06/2017 16:27         STUDIES:  Chest x-ray shows endotracheal tube in place, emphysema   CULTURES: September 26, 2017 respiratory culture September 26, 2017 respiratory viral panel: Positive for influenza A  ANTIBIOTICS: Levaquin d/ced 09/27/17 positive for Influenza A Ceftriaxone 09/30/17---- 10/05/17    SIGNIFICANT EVENTS:     LINES/TUBES: January 1 endotracheal tube   DISCUSSION: 67 year old female with a past medical history significant for severe COPD admitted for a COPD exacerbation causing acute respiratory failure with hypoxemia necessitating mechanical ventilation.  Failed extubation x2 will need trach continue supportive  care   ASSESSMENT / PLAN:  Acute respiratory failure with hypoxemia COPD exacerbation in setting of influenza A infection Possible right lower lobe pneumonia/superinfection Failed extubation attempt on 1/11, this was her second failed attempt Portable chest x-ray review: This demonstrates Essentially clear chest x-ray endotracheal tube is in satisfactory position duration improvement when compared to prior film Completed 5 days of antibiotics Plan Continue pressure support ventilation as tolerated, plan for early trach after discussing this with family Continue Solu-Medrol at 40 mg every 12 hours Continue Pulmicort and Brovana Continue VAP interventions  Leukocytosis  No significant  fever, chest x-ray clear perhaps this is related to steroids Plan Check procalcitonin Trend fever curve   Some T wave inversions 09/28/17 early morning,  leads 2,3, avf   elevated troponin to 0.15 Hypertension Plan Continue telemetry monitoring Continue aspirin Diltiazem for hypertension Will eventually need ischemia eval    Intermittent fluid and electrolyte imbalance Plan Trending chemistries replace as indicated   History of GERD Plan Continue Pepcid  Diabetes with hyperglycemia Plan Continue sliding scale insulin  As well as subcu Levemir  DVT prophylaxis: [place] heparin  SUP: pepcid  Diet: tubefeeds Activity: BR Disposition : ICU    FAMILY  - Updates: will update family on extubation   - Inter-disciplinary family meet or Palliative Care meeting due by:  day 7   Simonne Martinet ACNP-BC [facility] Pulmonary/Critical Care Pager # [phone] OR # [phone] if no answer  10/07/2017, 9:50 AM  Attending Note:  67 year old female with COPD in respiratory failure due to COPD that continues to fail weaning.  Patient is in need of a tracheostomy for prolonged weaning.  On exam, she is arousable but Tv is very poor.  I reviewed CXR myself, COPD noted and ETT is in good position.  Will continue weaning efforts.  Will need to speak with family regarding tach likely early next week.  In the meantime, continue abx and f/u on cultures.  PCCM will follow.  The patient is critically ill with multiple organ systems failure and requires high complexity decision making for assessment and support, frequent evaluation and titration of therapies, application of advanced monitoring technologies and extensive interpretation of multiple databases.   Critical Care Time devoted to patient care services described in this note is  35  Minutes. This time reflects time of care of this signee Dr Koren Bound. This critical care time does not reflect procedure time, or teaching time or supervisory time of  PA/NP/Med student/Med Resident etc but could involve care discussion time.  Alyson Reedy, M.D. [facility] Pulmonary/Critical Care Medicine. Pager: [phone]. After hours pager: [phone].

## 2017-10-07 NOTE — Progress Notes (Signed)
Pt placed back on full vent support for the night.  No distress noted, pt appears comfortable currently.

## 2017-10-08 ENCOUNTER — Inpatient Hospital Stay (HOSPITAL_COMMUNITY): Payer: Medicare Other

## 2017-10-08 DIAGNOSIS — G9340 Encephalopathy, unspecified: Secondary | ICD-10-CM

## 2017-10-08 LAB — CULTURE, RESPIRATORY W GRAM STAIN

## 2017-10-08 LAB — GLUCOSE, CAPILLARY
GLUCOSE-CAPILLARY: 154 mg/dL — AB (ref 65–99)
GLUCOSE-CAPILLARY: 147 mg/dL — AB (ref 65–99)
GLUCOSE-CAPILLARY: 171 mg/dL — AB (ref 65–99)
GLUCOSE-CAPILLARY: 143 mg/dL — AB (ref 65–99)
Glucose-Capillary: 166 mg/dL — ABNORMAL HIGH (ref 65–99)
Glucose-Capillary: 188 mg/dL — ABNORMAL HIGH (ref 65–99)
Glucose-Capillary: 192 mg/dL — ABNORMAL HIGH (ref 65–99)

## 2017-10-08 LAB — COMPREHENSIVE METABOLIC PANEL
ALT: 69 U/L — AB (ref 14–54)
AST: 24 U/L (ref 15–41)
Albumin: 2.6 g/dL — ABNORMAL LOW (ref 3.5–5.0)
Alkaline Phosphatase: 63 U/L (ref 38–126)
Anion gap: 10 (ref 5–15)
BUN: 37 mg/dL — ABNORMAL HIGH (ref 6–20)
CO2: 26 mmol/L (ref 22–32)
CREATININE: 0.55 mg/dL (ref 0.44–1.00)
Calcium: 8.4 mg/dL — ABNORMAL LOW (ref 8.9–10.3)
Chloride: 105 mmol/L (ref 101–111)
GFR calc non Af Amer: >60 mL/min (ref >60)
Glucose, Bld: 171 mg/dL — ABNORMAL HIGH (ref 65–99)
POTASSIUM: 4.4 mmol/L (ref 3.5–5.1)
SODIUM: 141 mmol/L (ref 135–145)
Total Bilirubin: 0.6 mg/dL (ref 0.3–1.2)
Total Protein: 5.4 g/dL — ABNORMAL LOW (ref 6.5–8.1)

## 2017-10-08 LAB — CBC
HEMATOCRIT: 33 % — AB (ref 36.0–46.0)
Hemoglobin: 10.4 g/dL — ABNORMAL LOW (ref 12.0–15.0)
MCH: 29.6 pg (ref 26.0–34.0)
MCHC: 31.5 g/dL (ref 30.0–36.0)
MCV: 94 fL (ref 78.0–100.0)
PLATELETS: 200 10*3/uL (ref 150–400)
RBC: 3.51 MIL/uL — AB (ref 3.87–5.11)
RDW: 14.2 % (ref 11.5–15.5)
WBC: 27.9 10*3/uL — AB (ref 4.0–10.5)

## 2017-10-08 LAB — MAGNESIUM: Magnesium: 2.4 mg/dL (ref 1.7–2.4)

## 2017-10-08 LAB — PHOSPHORUS: Phosphorus: 3.3 mg/dL (ref 2.5–4.6)

## 2017-10-08 LAB — CULTURE, RESPIRATORY

## 2017-10-08 MED ORDER — METHYLPREDNISOLONE SODIUM SUCC 40 MG IJ SOLR
20.0000 mg | Freq: Two times a day (BID) | INTRAMUSCULAR | Status: AC
  Administered 2017-10-08 – 2017-10-10 (×5): 20 mg via INTRAVENOUS
  Filled 2017-10-08 (×5): qty 0.5

## 2017-10-08 MED ORDER — FREE WATER
250.0000 mL | Freq: Three times a day (TID) | Status: DC
  Administered 2017-10-08 – 2017-10-09 (×3): 250 mL

## 2017-10-08 NOTE — Progress Notes (Signed)
PULMONARY / CRITICAL CARE MEDICINE   Name: Sally [name] MRN: [id number] DOB: 1951-01-19    ADMISSION DATE:  09/26/2017 CONSULTATION DATE:  09/26/2017  REFERRING MD:  Juleen China  CHIEF COMPLAINT:  dyspnea  HISTORY OF PRESENT ILLNESS:   67 year old female with a past medical history significant for severe COPD came to the emergency department on September 26, 2017 complaining of shortness of breath.  Her husband provides the history because she was intubated on my arrival.  He says that she continues to smoke cigarettes, greater than 1 pack/day.  She uses albuterol about 3 times a day but does not take any other inhalers.  She does not use oxygen.  About a week ago they had a cough or cold and were prescribed a Z-Pak.  She said that her symptoms persisted.  He denied fevers or chills.  She came to the emergency department and was initially treated with BiPAP but she developed worsening respiratory failure and required intubation. Respiratory panel returned influenza A positive  SUBJECTIVE:  Stable,no acute events overnight, awake this am, maybe trach today  VITAL SIGNS: BP (!) 121/59   Pulse 74   Temp 98 F (36.7 C) (Oral)   Resp 17   Ht 5\' 1"  (1.549 m)   Wt 163 lb 5.8 oz (74.1 kg)   SpO2 100%   BMI 30.87 kg/m   HEMODYNAMICS:    VENTILATOR SETTINGS: Vent Mode: PRVC FiO2 (%):  [40 %] 40 % Set Rate:  [18 bmp] 18 bmp Vt Set:  [500 mL] 500 mL PEEP:  [5 cmH20] 5 cmH20 Pressure Support:  [5 cmH20] 5 cmH20 Plateau Pressure:  [15 cmH20-18 cmH20] 15 cmH20  INTAKE / OUTPUT: I/O last 3 completed shifts: In: 2961.1 [I.V.:1461.1; NG/GT:1400; IV Piggyback:100] Out: 2575 [Urine:2575]  PHYSICAL EXAMINATION: General:  Sedated, comfortable Neuro:  Sedated, on vent HENT:  ETT in place PULM: Lungs clear today, vent supported breathing CV: RRR, no murmurs, clear S1, S2 GI: BS+, soft, nontender, slightly distended MSK: normal bulk and tone  LABS:  BMET Recent Labs  Lab 10/06/17 0245  10/07/17 0713 10/08/17 0423  NA 135 138 141  K 4.8 4.2 4.4  CL 100* 103 105  CO2 28 27 26   BUN 29* 41* 37*  CREATININE 0.52 0.69 0.55  GLUCOSE 189* 166* 171*   Electrolytes Recent Labs  Lab 10/06/17 0245 10/06/17 1708 10/07/17 0713 10/07/17 1658 10/08/17 0423  CALCIUM 8.1*  --  8.1*  --  8.4*  MG  --  2.2  --  2.3 2.4  PHOS  --  3.8  --  3.9 3.3   CBC Recent Labs  Lab 10/06/17 0245 10/07/17 0713 10/08/17 0423  WBC 22.9* 29.6* 27.9*  HGB 11.0* 10.9* 10.4*  HCT 34.8* 33.9* 33.0*  PLT 205 196 200   Coag's No results for input(s): APTT, INR in the last 168 hours.  Sepsis Markers Recent Labs  Lab 10/07/17 1007  PROCALCITON 0.25    ABG Recent Labs  Lab 10/03/17 1829 10/06/17 1610  PHART 7.421 7.417  PCO2ART 49.9* 45.8  PO2ART 104 124*    Liver Enzymes Recent Labs  Lab 10/08/17 0423  AST 24  ALT 69*  ALKPHOS 63  BILITOT 0.6  ALBUMIN 2.6*    Cardiac Enzymes No results for input(s): TROPONINI, PROBNP in the last 168 hours.  Glucose Recent Labs  Lab 10/07/17 0813 10/07/17 1241 10/07/17 1606 10/07/17 1952 10/08/17 0005 10/08/17 0347  GLUCAP 195* 189* 161* 177* 188* 154*    Imaging  No results found.       STUDIES:  Chest x-ray shows endotracheal tube in place, emphysema   CULTURES: September 26, 2017 respiratory culture September 26, 2017 respiratory viral panel  ANTIBIOTICS: Levaquin d/ced 09/27/17 positive for Influenza A Ceftriaxone 09/30/17---- 10/05/17 for new RLL infiltrate   SIGNIFICANT EVENTS:     LINES/TUBES: January 1 endotracheal tube   DISCUSSION: 67 year old female with a past medical history significant for severe COPD admitted for a COPD exacerbation causing acute respiratory failure with hypoxemia necessitating mechanical ventilation.   ASSESSMENT / PLAN:   PULMONARY A: Acute respiratory failure with hypoxemia COPD exacerbation P:   Solu-Medrol @ 40mg  every 12 hours decreased from 80mg  q6 Albuterol every 4  hours Added pulmicort and brovana Full mechanical vent support VAP prevention Daily WUA/SBT Failed extubation reintubated on 10/03/17 Second failed extubation on 10/06/17  Will need trach     CARDIOVASCULAR A:  Sinus tach Some T wave inversions 09/28/17 early morning,  leads 2,3, avf   elevated troponin to 0.15 P:  Telemetry monitoring On diltiazem for HTN only no afib history troponins trending down last was 0.06 Continue asa Will need ischemic workup on discharge dilt and hydral for BP    RENAL A:   No acute issues P:   Cr stable Monitor BMET and UOP Replace electrolytes as needed Hypernatremia resolved continue free water   GASTROINTESTINAL A:   GERD P:   Pepcid Tube feeding per protocol Colace scheduled    HEMATOLOGIC A:   No acute issues P:  Monitor for bleeding   INFECTIOUS A:   COPD exacerbation P:   Respiratory viral shows influenza A H1 5 days of tamiflu Added 5 days ceftriaxone prophylactically for new RLL infiltrate now resolved   ENDOCRINE A:   Some increase in blood glucose due to steroid increase P:   SSI + 10 u levemir   NEUROLOGIC A:   Reintubated, awake, follows commands P:   Continue wake up trials and sedation as needed    FAMILY  - Updates: family updated on 10/06/17 about failed extubation and need for trach   - Inter-disciplinary family meet or Palliative Care meeting due by:  day 7   Pulmonary and Critical Care Medicine [facility] Pager: [phone]  Attending Note:  67 year old female with severe COPD who presents with flu A and COPD exacerbation.  On exam, lung sounds are quite.  I reviewed CXR myself, ETT ok.  Begin weaning steroids.  PS as able.  Spoke with family, plan on trach in AM.  Hold off PEG for now.  Keep IVF balance even for now.  Continue bronchodilators as able.  Family on board with trach.  The patient is critically ill with multiple organ systems failure and requires high complexity decision  making for assessment and support, frequent evaluation and titration of therapies, application of advanced monitoring technologies and extensive interpretation of multiple databases.   Critical Care Time devoted to patient care services described in this note is  35  Minutes. This time reflects time of care of this signee Dr Koren Bound. This critical care time does not reflect procedure time, or teaching time or supervisory time of PA/NP/Med student/Med Resident etc but could involve care discussion time.  Alyson Reedy, M.D. [facility] Pulmonary/Critical Care Medicine. Pager: [phone]. After hours pager: [phone].  10/08/2017, 7:26 AM

## 2017-10-08 NOTE — Progress Notes (Signed)
Pt changed back to full vent support d/t low RR per RN.  No distress currently noted.

## 2017-10-08 NOTE — Progress Notes (Signed)
Orthopedic Tech Progress Note Patient Details:  Sally [name] 1950/10/28 [id number]  Ortho Devices Ortho Device/Splint Location: Bilateral Heel boots Ortho Device/Splint Interventions: Application   Post Interventions Patient Tolerated: Well Instructions Provided: Care of device   Saul Fordyce 10/08/2017, 6:17 PM

## 2017-10-09 ENCOUNTER — Inpatient Hospital Stay (HOSPITAL_COMMUNITY): Payer: Medicare Other

## 2017-10-09 DIAGNOSIS — Z7189 Other specified counseling: Secondary | ICD-10-CM

## 2017-10-09 LAB — GLUCOSE, CAPILLARY
GLUCOSE-CAPILLARY: 131 mg/dL — AB (ref 65–99)
GLUCOSE-CAPILLARY: 145 mg/dL — AB (ref 65–99)
GLUCOSE-CAPILLARY: 75 mg/dL (ref 65–99)
GLUCOSE-CAPILLARY: 91 mg/dL (ref 65–99)
GLUCOSE-CAPILLARY: 99 mg/dL (ref 65–99)
Glucose-Capillary: 112 mg/dL — ABNORMAL HIGH (ref 65–99)

## 2017-10-09 LAB — CBC WITH DIFFERENTIAL/PLATELET
BASOS ABS: 0 10*3/uL (ref 0.0–0.1)
Basophils Relative: 0 %
EOS ABS: 0 10*3/uL (ref 0.0–0.7)
Eosinophils Relative: 0 %
HEMATOCRIT: 29.6 % — AB (ref 36.0–46.0)
HEMOGLOBIN: 9.5 g/dL — AB (ref 12.0–15.0)
LYMPHS PCT: 9 %
Lymphs Abs: 2.2 10*3/uL (ref 0.7–4.0)
MCH: 29.9 pg (ref 26.0–34.0)
MCHC: 32.1 g/dL (ref 30.0–36.0)
MCV: 93.1 fL (ref 78.0–100.0)
MONOS PCT: 9 %
Monocytes Absolute: 2.2 10*3/uL — ABNORMAL HIGH (ref 0.1–1.0)
NEUTROS ABS: 19.6 10*3/uL — AB (ref 1.7–7.7)
NEUTROS PCT: 82 %
Platelets: 198 10*3/uL (ref 150–400)
RBC: 3.18 MIL/uL — AB (ref 3.87–5.11)
RDW: 14.3 % (ref 11.5–15.5)
WBC: 24 10*3/uL — ABNORMAL HIGH (ref 4.0–10.5)

## 2017-10-09 LAB — BASIC METABOLIC PANEL
Anion gap: 7 (ref 5–15)
BUN: 33 mg/dL — ABNORMAL HIGH (ref 6–20)
CHLORIDE: 104 mmol/L (ref 101–111)
CO2: 28 mmol/L (ref 22–32)
CREATININE: 0.48 mg/dL (ref 0.44–1.00)
Calcium: 8.4 mg/dL — ABNORMAL LOW (ref 8.9–10.3)
GFR calc non Af Amer: >60 mL/min (ref >60)
Glucose, Bld: 148 mg/dL — ABNORMAL HIGH (ref 65–99)
POTASSIUM: 4.8 mmol/L (ref 3.5–5.1)
SODIUM: 139 mmol/L (ref 135–145)

## 2017-10-09 LAB — PROTIME-INR
INR: 1.01
PROTHROMBIN TIME: 13.2 s (ref 11.4–15.2)

## 2017-10-09 MED ORDER — MAGIC MOUTHWASH
5.0000 mL | Freq: Three times a day (TID) | ORAL | Status: DC | PRN
  Administered 2017-10-09: 5 mL via ORAL
  Filled 2017-10-09 (×3): qty 5

## 2017-10-09 MED ORDER — MIDAZOLAM HCL 2 MG/2ML IJ SOLN: 4.0000 mg | Freq: Once | INTRAMUSCULAR | Status: DC

## 2017-10-09 MED ORDER — VECURONIUM BROMIDE 10 MG IV SOLR
10.0000 mg | Freq: Once | INTRAVENOUS | Status: AC
  Administered 2017-10-10: 10 mg via INTRAVENOUS
  Filled 2017-10-09: qty 10

## 2017-10-09 MED ORDER — PROPOFOL 500 MG/50ML IV EMUL: 5.0000 ug/kg/min | Freq: Once | INTRAVENOUS | Status: DC

## 2017-10-09 MED ORDER — INSULIN DETEMIR 100 UNIT/ML ~~LOC~~ SOLN
8.0000 [IU] | Freq: Every day | SUBCUTANEOUS | Status: DC
  Administered 2017-10-09: 8 [IU] via SUBCUTANEOUS
  Filled 2017-10-09 (×4): qty 0.08

## 2017-10-09 MED ORDER — FENTANYL CITRATE (PF) 100 MCG/2ML IJ SOLN
200.0000 ug | Freq: Once | INTRAMUSCULAR | Status: AC
  Administered 2017-10-10: 100 ug via INTRAVENOUS

## 2017-10-09 MED ORDER — VECURONIUM BROMIDE 10 MG IV SOLR
10.0000 mg | Freq: Once | INTRAVENOUS | Status: DC
  Filled 2017-10-09: qty 10

## 2017-10-09 MED ORDER — DOCUSATE SODIUM 50 MG/5ML PO LIQD: 100.0000 mg | Freq: Two times a day (BID) | ORAL | Status: DC

## 2017-10-09 MED ORDER — FREE WATER
200.0000 mL | Freq: Three times a day (TID) | Status: DC
  Administered 2017-10-09 – 2017-10-10 (×2): 200 mL

## 2017-10-09 MED ORDER — FENTANYL CITRATE (PF) 100 MCG/2ML IJ SOLN: 200.0000 ug | Freq: Once | INTRAMUSCULAR | Status: DC

## 2017-10-09 MED ORDER — PROPOFOL 500 MG/50ML IV EMUL
5.0000 ug/kg/min | INTRAVENOUS | Status: DC
  Filled 2017-10-09: qty 50

## 2017-10-09 MED ORDER — ETOMIDATE 2 MG/ML IV SOLN
40.0000 mg | Freq: Once | INTRAVENOUS | Status: DC
  Filled 2017-10-09: qty 20

## 2017-10-09 MED ORDER — ETOMIDATE 2 MG/ML IV SOLN
40.0000 mg | Freq: Once | INTRAVENOUS | Status: AC
  Administered 2017-10-10: 20 mg via INTRAVENOUS
  Filled 2017-10-09: qty 20

## 2017-10-09 MED ORDER — SODIUM POLYSTYRENE SULFONATE PO POWD
30.0000 g | Freq: Once | ORAL | Status: AC
Start: 2017-10-09 — End: 2017-10-09
  Administered 2017-10-09: 30 g via ORAL
  Filled 2017-10-09: qty 30

## 2017-10-09 MED ORDER — MAGNESIUM HYDROXIDE 400 MG/5ML PO SUSP: 30.0000 mL | Freq: Once | ORAL | Status: DC

## 2017-10-09 NOTE — Care Management Note (Signed)
Case Management Note  Patient Details  Name: Sally [name] MRN: [id number] Date of Birth: 1951-05-09  Subjective/Objective:                   Pt admitted with respiratory failure secondary to the flu  Action/Plan:   PTA independent from home with husband.  Pt had to be re-intubated 10/03/17.  CM will continue to follow for discharge needs   Expected Discharge Date:                  Expected Discharge Plan:  (From home with husband)  In-House Referral:     Discharge planning Services  CM Consult  Post Acute Care Choice:    Choice offered to:     DME Arranged:    DME Agency:     HH Arranged:    HH Agency:     Status of Service:     If discussed at [employer], dates discussed:    Additional Comments: 10/09/2017 Pt remains intubated - plan is for trach 1/15.  CSW reaching out to pts family to discuss the possibility of out of state placement if pt is unable to liberate from vent.    Cherylann Parr, RN 10/09/2017, 4:16 PM

## 2017-10-09 NOTE — Progress Notes (Signed)
PULMONARY / CRITICAL CARE MEDICINE   Name: Sally [name] MRN: [id number] DOB: 1950/12/22    ADMISSION DATE:  09/26/2017 CONSULTATION DATE:  09/26/2017  REFERRING MD:  Juleen China  CHIEF COMPLAINT:  dyspnea  HISTORY OF PRESENT ILLNESS:   67 year old female with a past medical history significant for severe COPD came to the emergency department on September 26, 2017 complaining of shortness of breath.  Her husband provides the history because she was intubated on my arrival.  He says that she continues to smoke cigarettes, greater than 1 pack/day.  She uses albuterol about 3 times a day but does not take any other inhalers.  She does not use oxygen.  About a week ago they had a cough or cold and were prescribed a Z-Pak.  She said that her symptoms persisted.  He denied fevers or chills.  She came to the emergency department and was initially treated with BiPAP but she developed worsening respiratory failure and required intubation. Respiratory panel returned influenza A positive  SUBJECTIVE:  Stable,no acute events overnight, on precedex overnight  VITAL SIGNS: BP (!) 120/55   Pulse 75   Temp 98.2 F (36.8 C) (Oral)   Resp 18   Ht 5\' 1"  (1.549 m)   Wt 166 lb 10.7 oz (75.6 kg)   SpO2 100%   BMI 31.49 kg/m   HEMODYNAMICS:    VENTILATOR SETTINGS: Vent Mode: PRVC FiO2 (%):  [40 %] 40 % Set Rate:  [18 bmp] 18 bmp Vt Set:  [500 mL] 500 mL PEEP:  [5 cmH20] 5 cmH20 Pressure Support:  [5 cmH20] 5 cmH20 Plateau Pressure:  [14 cmH20-20 cmH20] 16 cmH20  INTAKE / OUTPUT: I/O last 3 completed shifts: In: 3101.9 [I.V.:1501.9; NG/GT:1400; IV Piggyback:200] Out: 2280 [Urine:2270; Stool:10]  PHYSICAL EXAMINATION: General:  Alert and interactive, comfortable Neuro:  Sedated, on vent, moving all ext to command HENT:  [place]/AT, PERRL, EOM-I and MMM PULM: Coarse BS diffusely CV: RRR, Nl S1/S2 and -M/R/G. GI: Soft, NT, ND and +BS MSK: normal bulk and tone  LABS:  BMET Recent Labs  Lab  10/07/17 0713 10/08/17 0423 10/09/17 0328  NA 138 141 139  K 4.2 4.4 4.8  CL 103 105 104  CO2 27 26 28   BUN 41* 37* 33*  CREATININE 0.69 0.55 0.48  GLUCOSE 166* 171* 148*   Electrolytes Recent Labs  Lab 10/06/17 1708 10/07/17 0713 10/07/17 1658 10/08/17 0423 10/09/17 0328  CALCIUM  --  8.1*  --  8.4* 8.4*  MG 2.2  --  2.3 2.4  --   PHOS 3.8  --  3.9 3.3  --    CBC Recent Labs  Lab 10/06/17 0245 10/07/17 0713 10/08/17 0423  WBC 22.9* 29.6* 27.9*  HGB 11.0* 10.9* 10.4*  HCT 34.8* 33.9* 33.0*  PLT 205 196 200   Coag's No results for input(s): APTT, INR in the last 168 hours.  Sepsis Markers Recent Labs  Lab 10/07/17 1007  PROCALCITON 0.25    ABG Recent Labs  Lab 10/03/17 1829 10/06/17 1610  PHART 7.421 7.417  PCO2ART 49.9* 45.8  PO2ART 104 124*    Liver Enzymes Recent Labs  Lab 10/08/17 0423  AST 24  ALT 69*  ALKPHOS 63  BILITOT 0.6  ALBUMIN 2.6*    Cardiac Enzymes No results for input(s): TROPONINI, PROBNP in the last 168 hours.  Glucose Recent Labs  Lab 10/08/17 0820 10/08/17 1244 10/08/17 1640 10/08/17 1959 10/08/17 2359 10/09/17 0401  GLUCAP 147* 171* 166* 143* 192* 131*  Imaging Dg Chest Port 1 View  Result Date: 10/08/2017 CLINICAL DATA:  Influenza EXAM: PORTABLE CHEST 1 VIEW COMPARISON:  10/07/2017 FINDINGS: Support devices are stable. Heart is normal size. Minimal residual density at the medial right lung base. Left lung is clear. No effusions or acute bony abnormality. IMPRESSION: Minimal residual right infrahilar medial infiltrate. Electronically Signed   By: Charlett Nose M.D.   On: 10/08/2017 07:38         STUDIES:  Chest x-ray shows endotracheal tube in place, emphysema   CULTURES: September 26, 2017 respiratory culture September 26, 2017 respiratory viral panel  ANTIBIOTICS: Levaquin d/ced 09/27/17 positive for Influenza A Ceftriaxone 09/30/17---- 10/05/17 for new RLL infiltrate   SIGNIFICANT EVENTS:      LINES/TUBES: January 1 endotracheal tube   DISCUSSION: 67 year old female with a past medical history significant for severe COPD admitted for a COPD exacerbation causing acute respiratory failure with hypoxemia necessitating mechanical ventilation.   ASSESSMENT / PLAN:   PULMONARY A: Acute respiratory failure with hypoxemia COPD exacerbation P:   Solu-Medrol decreased to 20mg  BID on 1/13, will taper off Albuterol every 4 hours pulmicort and brovana Full mechanical vent support VAP prevention Daily WUA/SBT Failed extubation reintubated on 10/03/17 Second failed extubation on 10/06/17  Will need trach     CARDIOVASCULAR A:  Sinus tach Some T wave inversions 09/28/17 early morning,  leads 2,3, avf   elevated troponin to 0.15 P:  Telemetry monitoring On diltiazem for HTN only no afib history troponins trending down last was 0.06 Continue asa Will need ischemic workup on discharge dilt and hydral for BP    RENAL A:   No acute issues P:   Cr stable Monitor BMET and UOP Replace electrolytes as needed Hypernatremia resolved continue free water Will Ask pharmacy about decreasing K in tube feeds   GASTROINTESTINAL A:   GERD P:   Pepcid Tube feeding per protocol stopped in anticipation of trach Colace scheduled    HEMATOLOGIC A:   No acute issues P:  Monitor for bleeding   INFECTIOUS A:   COPD exacerbation P:   Respiratory viral shows influenza A H1 5 days of tamiflu received 5 days ceftriaxone prophylactically for new RLL infiltrate now resolved   ENDOCRINE A:   Some increase in blood glucose due to steroid increase P:   SSI + decreased to 8 u levemir now with steroid taper   NEUROLOGIC A:   Reintubated, awake, follows commands P:   Continue wake up trials and sedation as needed  FAMILY  - Updates: family updated on 10/08/17 about failed extubation and need for trach   - Inter-disciplinary family meet or Palliative Care meeting due by:  day 7     Thornell Mule MD PGY-1 Internal Medicine Pager # [phone]  Attending Note:  67 year old female with severe COPD who presents to the hospital with respiratory failure, multiple extubation/reintubation with no success.  Wean but fails within 2 hours.  Concerned that patient will continue this patter and tracheostomy is in discussion.  On exam, distant BS.  I reviewed CXR myself, ETT ok.  Family deciding on trach/peg.  Will hold off weaning today and proceed with tracheostomy likely in AM.    The patient is critically ill with multiple organ systems failure and requires high complexity decision making for assessment and support, frequent evaluation and titration of therapies, application of advanced monitoring technologies and extensive interpretation of multiple databases.   Critical Care Time devoted to patient care services  described in this note is  35  Minutes. This time reflects time of care of this signee Dr Koren Bound. This critical care time does not reflect procedure time, or teaching time or supervisory time of PA/NP/Med student/Med Resident etc but could involve care discussion time.  Alyson Reedy, M.D. [facility] Pulmonary/Critical Care Medicine. Pager: [phone]. After hours pager: [phone].  10/09/2017, 6:15 AM

## 2017-10-09 NOTE — Progress Notes (Signed)
CSW reached out to pt's husband as well as son to discuss placement options if trach is placed. CSW was unable to verbally speak with either party therefore CSW left VM on pt's son phone asking that he call CSW back. CSW continues to follow for any CSW needs at this time.     Claude Manges Yunique Dearcos, MSW, LCSW-A Emergency Department Clinical Social Worker [phone]

## 2017-10-10 ENCOUNTER — Encounter (HOSPITAL_COMMUNITY): Payer: Medicare Other

## 2017-10-10 ENCOUNTER — Inpatient Hospital Stay (HOSPITAL_COMMUNITY): Payer: Medicare Other

## 2017-10-10 DIAGNOSIS — J9600 Acute respiratory failure, unspecified whether with hypoxia or hypercapnia: Secondary | ICD-10-CM

## 2017-10-10 DIAGNOSIS — Z930 Tracheostomy status: Secondary | ICD-10-CM

## 2017-10-10 LAB — GLUCOSE, CAPILLARY
GLUCOSE-CAPILLARY: 124 mg/dL — AB (ref 65–99)
Glucose-Capillary: 101 mg/dL — ABNORMAL HIGH (ref 65–99)
Glucose-Capillary: 125 mg/dL — ABNORMAL HIGH (ref 65–99)
Glucose-Capillary: 127 mg/dL — ABNORMAL HIGH (ref 65–99)
Glucose-Capillary: 137 mg/dL — ABNORMAL HIGH (ref 65–99)
Glucose-Capillary: 147 mg/dL — ABNORMAL HIGH (ref 65–99)

## 2017-10-10 LAB — CBC
HCT: 29.9 % — ABNORMAL LOW (ref 36.0–46.0)
Hemoglobin: 9.7 g/dL — ABNORMAL LOW (ref 12.0–15.0)
MCH: 29.6 pg (ref 26.0–34.0)
MCHC: 32.4 g/dL (ref 30.0–36.0)
MCV: 91.2 fL (ref 78.0–100.0)
Platelets: 208 10*3/uL (ref 150–400)
RBC: 3.28 MIL/uL — AB (ref 3.87–5.11)
RDW: 13.9 % (ref 11.5–15.5)
WBC: 18 10*3/uL — AB (ref 4.0–10.5)

## 2017-10-10 LAB — BASIC METABOLIC PANEL
Anion gap: 10 (ref 5–15)
BUN: 27 mg/dL — ABNORMAL HIGH (ref 6–20)
CALCIUM: 8.1 mg/dL — AB (ref 8.9–10.3)
CO2: 28 mmol/L (ref 22–32)
Chloride: 98 mmol/L — ABNORMAL LOW (ref 101–111)
Creatinine, Ser: 0.5 mg/dL (ref 0.44–1.00)
GFR calc non Af Amer: >60 mL/min (ref >60)
Glucose, Bld: 117 mg/dL — ABNORMAL HIGH (ref 65–99)
Potassium: 4.2 mmol/L (ref 3.5–5.1)
Sodium: 136 mmol/L (ref 135–145)

## 2017-10-10 LAB — BLOOD GAS, ARTERIAL
Acid-Base Excess: 6 mmol/L — ABNORMAL HIGH (ref 0.0–2.0)
Bicarbonate: 29.1 mmol/L — ABNORMAL HIGH (ref 20.0–28.0)
Drawn by: 41977
FIO2: 40
LHR: 18 {breaths}/min
O2 SAT: 97.6 %
PATIENT TEMPERATURE: 98.6
PCO2 ART: 35.6 mmHg (ref 32.0–48.0)
PEEP: 5 cmH2O
VT: 500 mL
pH, Arterial: 7.522 — ABNORMAL HIGH (ref 7.350–7.450)
pO2, Arterial: 102 mmHg (ref 83.0–108.0)

## 2017-10-10 LAB — MAGNESIUM: MAGNESIUM: 2 mg/dL (ref 1.7–2.4)

## 2017-10-10 LAB — PHOSPHORUS: Phosphorus: 4.1 mg/dL (ref 2.5–4.6)

## 2017-10-10 MED ORDER — PREDNISONE 10 MG PO TABS: 10.0000 mg | ORAL_TABLET | Freq: Every day | ORAL | Status: DC

## 2017-10-10 MED ORDER — DEXTROSE IN LACTATED RINGERS 5 % IV SOLN
INTRAVENOUS | Status: AC
  Administered 2017-10-10 (×2): via INTRAVENOUS

## 2017-10-10 MED ORDER — PREDNISONE 5 MG PO TABS: 5.0000 mg | ORAL_TABLET | Freq: Every day | ORAL | Status: DC

## 2017-10-10 MED ORDER — MIDAZOLAM HCL 2 MG/2ML IJ SOLN
INTRAMUSCULAR | Status: AC
  Administered 2017-10-10: 4 mg
  Filled 2017-10-10: qty 4

## 2017-10-10 MED ORDER — FENTANYL CITRATE (PF) 100 MCG/2ML IJ SOLN
INTRAMUSCULAR | Status: AC
  Administered 2017-10-10: 100 ug
  Filled 2017-10-10: qty 4

## 2017-10-10 MED ORDER — ORAL CARE MOUTH RINSE
15.0000 mL | OROMUCOSAL | Status: DC
  Administered 2017-10-10 – 2017-10-11 (×9): 15 mL via OROMUCOSAL

## 2017-10-10 MED ORDER — FENTANYL CITRATE (PF) 100 MCG/2ML IJ SOLN
25.0000 ug | INTRAMUSCULAR | Status: AC | PRN
  Administered 2017-10-10 – 2017-10-11 (×6): 100 ug via INTRAVENOUS
  Filled 2017-10-10 (×6): qty 2

## 2017-10-10 MED ORDER — FREE WATER
100.0000 mL | Freq: Three times a day (TID) | Status: DC
  Administered 2017-10-11 – 2017-10-23 (×30): 100 mL

## 2017-10-10 MED ORDER — PREDNISONE 20 MG PO TABS: 20.0000 mg | ORAL_TABLET | Freq: Every day | ORAL | Status: DC

## 2017-10-10 MED ORDER — CHLORHEXIDINE GLUCONATE 0.12% ORAL RINSE (MEDLINE KIT)
15.0000 mL | Freq: Two times a day (BID) | OROMUCOSAL | Status: DC
  Administered 2017-10-10 – 2017-10-27 (×29): 15 mL via OROMUCOSAL

## 2017-10-10 MED ORDER — PREDNISONE 20 MG PO TABS
40.0000 mg | ORAL_TABLET | Freq: Every day | ORAL | Status: DC
  Administered 2017-10-12 – 2017-10-13 (×2): 40 mg via ORAL
  Filled 2017-10-10 (×3): qty 2

## 2017-10-10 MED ORDER — PREDNISONE 20 MG PO TABS: 30.0000 mg | ORAL_TABLET | Freq: Every day | ORAL | Status: DC

## 2017-10-10 NOTE — Progress Notes (Signed)
200cc Fentanyl drip wasted in sink.  Witnessed by Abe People, RN.  Pt no longer on continuous drip.

## 2017-10-10 NOTE — Procedures (Signed)
Bronchoscopy  for Percutaneous  Tracheostomy  Name: Sally [name] MRN: [id number] DOB: [birth year] Procedure: Bronchoscopy for Percutaneous Tracheostomy Indications: Diagnostic evaluation of the airways In conjunction with: Dr. Molli Knock  Procedure Details Consent: Risks of procedure as well as the alternatives and risks of each were explained to the (patient/caregiver).  Consent for procedure obtained. Time Out: Verified patient identification, verified procedure, site/side was marked, verified correct patient position, special equipment/implants available, medications/allergies/relevent history reviewed, required imaging and test results available.  Performed  In preparation for procedure, patient was given 100% FiO2 and bronchoscope lubricated. Sedation: Benzodiazepines, Muscle relaxants, Etomidate and Short-acting barbiturates  Airway entered and the following bronchi were examined: Trachea. Procedures performed: Endotracheal Tube retracted in 2 cm increments. Cannulation of airway observed. Dilation observed. Placement of trachel tube  observed . No overt complications. Bronchoscope removed.  , Patient placed back on 100% FiO2 at conclusion of procedure.    Evaluation Hemodynamic Status: BP stable throughout; O2 sats: stable throughout Patient's Current Condition: stable Specimens:  None Complications: No apparent complications Patient did tolerate procedure well.   Brett Canales Minor ACNP [facility] Pager [phone] till 3 pm If no answer page [phone] 10/10/2017, 9:19 AM  Alyson Reedy, M.D. [facility] Pulmonary/Critical Care Medicine. Pager: [phone]. After hours pager: [phone]

## 2017-10-10 NOTE — Progress Notes (Signed)
PULMONARY / CRITICAL CARE MEDICINE   Name: Sally [name] MRN: [id number] DOB: 05/23/1951    ADMISSION DATE:  09/26/2017 CONSULTATION DATE:  09/26/2017  REFERRING MD:  Juleen China  CHIEF COMPLAINT:  dyspnea  HISTORY OF PRESENT ILLNESS:   67 year old female with a past medical history significant for severe COPD came to the emergency department on September 26, 2017 complaining of shortness of breath.  Her husband provides the history because she was intubated on my arrival.  He says that she continues to smoke cigarettes, greater than 1 pack/day.  She uses albuterol about 3 times a day but does not take any other inhalers.  She does not use oxygen.  About a week ago they had a cough or cold and were prescribed a Z-Pak.  She said that her symptoms persisted.  He denied fevers or chills.  She came to the emergency department and was initially treated with BiPAP but she developed worsening respiratory failure and required intubation. Respiratory panel returned influenza A positive  SUBJECTIVE:  Stable,no acute events overnight,   VITAL SIGNS: BP (!) 135/52   Pulse 84   Temp 98.7 F (37.1 C) (Oral)   Resp 17   Ht 5\' 1"  (1.549 m)   Wt 162 lb 7.7 oz (73.7 kg)   SpO2 100%   BMI 30.70 kg/m   HEMODYNAMICS:    VENTILATOR SETTINGS: Vent Mode: PRVC FiO2 (%):  [40 %] 40 % Set Rate:  [18 bmp] 18 bmp Vt Set:  [500 mL] 500 mL PEEP:  [5 cmH20] 5 cmH20 Plateau Pressure:  [14 cmH20-15 cmH20] 14 cmH20  INTAKE / OUTPUT: I/O last 3 completed shifts: In: 2808.8 [I.V.:1290.2; NG/GT:1368.7; IV Piggyback:150] Out: 2670 [Urine:2670]  PHYSICAL EXAMINATION: General:  Alert and interactive, comfortable Neuro:  Sedated, on vent, moving all ext to command HENT:  [place]/AT, PERRL, EOM-I and MMM PULM: CTAB CV: RRR, Nl S1/S2 and -M/R/G. GI: Soft, NT, ND and +BS MSK: normal bulk and tone  LABS:  BMET Recent Labs  Lab 10/08/17 0423 10/09/17 0328 10/10/17 0319  NA 141 139 136  K 4.4 4.8 4.2  CL 105 104 98*   CO2 26 28 28   BUN 37* 33* 27*  CREATININE 0.55 0.48 0.50  GLUCOSE 171* 148* 117*   Electrolytes Recent Labs  Lab 10/07/17 1658 10/08/17 0423 10/09/17 0328 10/10/17 0319  CALCIUM  --  8.4* 8.4* 8.1*  MG 2.3 2.4  --  2.0  PHOS 3.9 3.3  --  4.1   CBC Recent Labs  Lab 10/08/17 0423 10/09/17 0711 10/10/17 0319  WBC 27.9* 24.0* 18.0*  HGB 10.4* 9.5* 9.7*  HCT 33.0* 29.6* 29.9*  PLT 200 198 208   Coag's Recent Labs  Lab 10/09/17 1116  INR 1.01    Sepsis Markers Recent Labs  Lab 10/07/17 1007  PROCALCITON 0.25    ABG Recent Labs  Lab 10/03/17 1829 10/06/17 1610 10/10/17 0310  PHART 7.421 7.417 7.522*  PCO2ART 49.9* 45.8 35.6  PO2ART 104 124* 102    Liver Enzymes Recent Labs  Lab 10/08/17 0423  AST 24  ALT 69*  ALKPHOS 63  BILITOT 0.6  ALBUMIN 2.6*    Cardiac Enzymes No results for input(s): TROPONINI, PROBNP in the last 168 hours.  Glucose Recent Labs  Lab 10/09/17 0742 10/09/17 1113 10/09/17 1458 10/09/17 1949 10/10/17 0002 10/10/17 0401  GLUCAP 112* 91 75 99 145* 101*    Imaging No results found.       STUDIES:  Chest x-ray shows endotracheal  tube in place, emphysema   CULTURES: September 26, 2017 respiratory culture September 26, 2017 respiratory viral panel  ANTIBIOTICS: Levaquin d/ced 09/27/17 positive for Influenza A Ceftriaxone 09/30/17---- 10/05/17 for new RLL infiltrate   SIGNIFICANT EVENTS:     LINES/TUBES: January 1 endotracheal tube   DISCUSSION: 67 year old female with a past medical history significant for severe COPD admitted for a COPD exacerbation causing acute respiratory failure with hypoxemia necessitating mechanical ventilation.   ASSESSMENT / PLAN:   PULMONARY A: Acute respiratory failure with hypoxemia COPD exacerbation P:   Solu-Medrol decreased to 20mg  BID on 1/13, will taper off Albuterol every 4 hours pulmicort and brovana Full mechanical vent support VAP prevention Daily WUA/SBT Failed  extubation reintubated on 10/03/17 Second failed extubation on 10/06/17  Will need to decrease RR due to alkalosis Will perform trach today     CARDIOVASCULAR A:  Sinus tach Some T wave inversions 09/28/17 early morning,  leads 2,3, avf   elevated troponin to 0.15 P:  Telemetry monitoring On diltiazem for HTN only no afib history troponins trending down last was 0.06 Continue asa Will need ischemic workup on discharge dilt and hydral for BP    RENAL A:   No acute issues P:   Cr stable Monitor BMET and UOP Replace electrolytes as needed Hypernatremia resolved continue free water    GASTROINTESTINAL A:   GERD P:   Pepcid Tube feeding per protocol stopped in anticipation of trach Colace scheduled    HEMATOLOGIC A:   No acute issues P:  Monitor for bleeding   INFECTIOUS A:   COPD exacerbation P:   Respiratory viral shows influenza A H1 5 days of tamiflu received 5 days ceftriaxone prophylactically for new RLL infiltrate now resolved   ENDOCRINE A:   Some increase in blood glucose due to steroid increase P:   SSI + decreased to 8 u levemir now with steroid taper   NEUROLOGIC A:   Reintubated, awake, follows commands P:   Continue wake up trials and sedation as needed  FAMILY  - Updates: family updated on 10/08/17 about failed extubation and need for trach   - Inter-disciplinary family meet or Palliative Care meeting due by:  day 7    Thornell Mule MD PGY-1 Internal Medicine Pager # [phone]  10/10/2017, 7:05 AM  Attending Note:  67 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  On exam, distant BS.  I reviewed CXR myself, ETT is in good position.  Discussed with PCCM-NP.  Called with patient decompensating from a respiratory standpoint.  ETT was out.  Patient was emergently intubated and emergently trached given previous history.  Were unable to get an NGT in and will wait for cortrak to do it.  Continue support for  now and will begin PS trials in AM.  Above note edited in full.  The patient is critically ill with multiple organ systems failure and requires high complexity decision making for assessment and support, frequent evaluation and titration of therapies, application of advanced monitoring technologies and extensive interpretation of multiple databases.   Critical Care Time devoted to patient care services described in this note is  35  Minutes. This time reflects time of care of this signee Dr Koren Bound. This critical care time does not reflect procedure time, or teaching time or supervisory time of PA/NP/Med student/Med Resident etc but could involve care discussion time.  Alyson Reedy, M.D. [facility] Pulmonary/Critical Care Medicine. Pager: [phone]. After hours pager: [phone].

## 2017-10-10 NOTE — Procedures (Signed)
Bedside Tracheostomy Insertion Procedure Note   Patient Details:   Name: Sally [name] DOB: 1950-10-17 MRN: [id number]  Procedure: Tracheostomy  Pre Procedure Assessment: ET Tube Size:7.5 ET Tube secured at lip (cm):21 Bite block in place: No Breath Sounds: Rhonch  Post Procedure Assessment: BP (!) 118/47   Pulse 78   Temp 99.1 F (37.3 C) (Oral)   Resp (!) 25   Ht 5\' 1"  (1.549 m)   Wt 162 lb 7.7 oz (73.7 kg)   SpO2 99%   BMI 30.70 kg/m  O2 sats: stable throughout Complications: No apparent complications Patient did tolerate procedure well Tracheostomy Brand:Shiley Tracheostomy Style:Cuffed Tracheostomy Size: 6 Tracheostomy Secured MBT:[id number] Tracheostomy Placement Confirmation:Trach cuff visualized and in place and Chest X ray ordered for placement    Cherylin Mylar 10/10/2017, 9:23 AM

## 2017-10-10 NOTE — Progress Notes (Signed)
CSW attempted to reach out to both pt's husband and son to speak with them about out of state placement options for pt at the time of discharge. At this time CSW has not received call back however left VM asking that either party call CSW back. CSW continues to follow for further discharge needs.    Claude Manges Adalai Perl, MSW, LCSW-A Emergency Department Clinical Social Worker [phone]

## 2017-10-10 NOTE — Procedures (Signed)
Intubation Procedure Note Sally [name] [id number] Aug 18, 1951  Procedure: Intubation Indications: Respiratory insufficiency  Procedure Details Consent: Unable to obtain consent because of emergent medical necessity. Time Out: Verified patient identification, verified procedure, site/side was marked, verified correct patient position, special equipment/implants available, medications/allergies/relevent history reviewed, required imaging and test results available.  Performed  Maximum sterile technique was used including gloves, hand hygiene and mask.  MAC    Evaluation Hemodynamic Status: BP stable throughout; O2 sats: stable throughout Patient's Current Condition: stable Complications: No apparent complications Patient did tolerate procedure well. Chest X-ray ordered to verify placement.  CXR: pending.   Jennet Maduro 10/10/2017

## 2017-10-10 NOTE — Procedures (Signed)
Percutaneous Tracheostomy Placement  Consent from family.  Patient sedated, paralyzed and position.  Placed on 100% FiO2 and RR matched.  Area cleaned and draped.  Lidocaine/epi injected.  Skin incision done followed by blunt dissection.  Trachea palpated then punctured, catheter passed and visualized bronchoscopically.  Wire placed and visualized.  Catheter removed.  Airway then crushed and dilated.  Size 6 cuffed shiley trach placed and visualized bronchoscopically well above carina.  Good volume returns.  Patient tolerated the procedure well without complications.  Minimal blood loss.  CXR ordered and pending.  Jasslyn Finkel G. Braeden Dolinski, M.D. [place] [facility]/Critical [facility] Medicine. Pager: [phone]. After hours pager: [phone]. 

## 2017-10-10 NOTE — Progress Notes (Signed)
Patient began losing volumes on ventilator and had eyes fixed upon ceiling, and no longer following commands. CCM at bedside and RT. Think patient had a mucous plug and decided to do bronch and trach. For bronch 100 fent and 2 versed while trach 100 fent, 2 versed, 20 etomidate, and 10 vecuronium. Will continue to monitor. Huntley Estelle E, RN 10/10/2017 9:10 AM

## 2017-10-10 NOTE — Procedures (Signed)
Bronchoscopy Procedure Note Sally [name] [id number] [birth year]  Procedure: Bronchoscopy Indications: Remove secretions  Procedure Details Consent: Unable to obtain consent because of emergent medical necessity. Time Out: Verified patient identification, verified procedure, site/side was marked, verified correct patient position, special equipment/implants available, medications/allergies/relevent history reviewed, required imaging and test results available.  Performed  In preparation for procedure, patient was given 100% FiO2 and bronchoscope lubricated. Sedation: Benzodiazepines, Muscle relaxants, Etomidate and Fentanyl  Airway entered and the following bronchi were examined: RUL, RML, RLL, LUL, LLL and Bronchi.   Bronchoscope removed.  , Patient placed back on 100% FiO2 at conclusion of procedure.    Evaluation Hemodynamic Status: BP stable throughout; O2 sats: stable throughout Patient's Current Condition: stable Specimens:  None Complications: No apparent complications Patient did tolerate procedure well.   Sally [name] 10/10/2017

## 2017-10-11 ENCOUNTER — Inpatient Hospital Stay (HOSPITAL_COMMUNITY): Payer: Medicare Other

## 2017-10-11 DIAGNOSIS — S270XXA Traumatic pneumothorax, initial encounter: Secondary | ICD-10-CM

## 2017-10-11 LAB — BLOOD GAS, ARTERIAL
ACID-BASE EXCESS: 5.1 mmol/L — AB (ref 0.0–2.0)
Bicarbonate: 28.5 mmol/L — ABNORMAL HIGH (ref 20.0–28.0)
DRAWN BY: 347621
FIO2: 0.4
PATIENT TEMPERATURE: 99.3
PCO2 ART: 38.7 mmHg (ref 32.0–48.0)
PEEP: 5 cmH2O
PH ART: 7.482 — AB (ref 7.350–7.450)
PO2 ART: 150 mmHg — AB (ref 83.0–108.0)
RATE: 18 resp/min
VT: 500 mL

## 2017-10-11 LAB — GLUCOSE, CAPILLARY
GLUCOSE-CAPILLARY: 137 mg/dL — AB (ref 65–99)
Glucose-Capillary: 134 mg/dL — ABNORMAL HIGH (ref 65–99)
Glucose-Capillary: 118 mg/dL — ABNORMAL HIGH (ref 65–99)
Glucose-Capillary: 158 mg/dL — ABNORMAL HIGH (ref 65–99)
Glucose-Capillary: 94 mg/dL (ref 65–99)
Glucose-Capillary: 100 mg/dL — ABNORMAL HIGH (ref 65–99)

## 2017-10-11 LAB — BASIC METABOLIC PANEL
ANION GAP: 12 (ref 5–15)
BUN: 17 mg/dL (ref 6–20)
CHLORIDE: 101 mmol/L (ref 101–111)
CO2: 24 mmol/L (ref 22–32)
Calcium: 8 mg/dL — ABNORMAL LOW (ref 8.9–10.3)
Creatinine, Ser: 0.5 mg/dL (ref 0.44–1.00)
GFR calc Af Amer: >60 mL/min (ref >60)
GFR calc non Af Amer: >60 mL/min (ref >60)
GLUCOSE: 160 mg/dL — AB (ref 65–99)
POTASSIUM: 3.8 mmol/L (ref 3.5–5.1)
Sodium: 137 mmol/L (ref 135–145)

## 2017-10-11 MED ORDER — HYDROXYZINE PAMOATE 25 MG PO CAPS
25.0000 mg | ORAL_CAPSULE | Freq: Three times a day (TID) | ORAL | Status: DC | PRN
  Filled 2017-10-11 (×2): qty 1

## 2017-10-11 MED ORDER — LIDOCAINE VISCOUS 2 % MT SOLN
OROMUCOSAL | Status: AC
  Administered 2017-10-11: 3 mL via OROMUCOSAL
  Filled 2017-10-11: qty 15

## 2017-10-11 MED ORDER — MIDAZOLAM HCL 2 MG/2ML IJ SOLN
2.0000 mg | Freq: Once | INTRAMUSCULAR | Status: AC
  Administered 2017-10-11: 2 mg via INTRAVENOUS

## 2017-10-11 MED ORDER — FENTANYL CITRATE (PF) 100 MCG/2ML IJ SOLN
INTRAMUSCULAR | Status: AC
  Filled 2017-10-11: qty 2

## 2017-10-11 MED ORDER — FENTANYL CITRATE (PF) 100 MCG/2ML IJ SOLN
100.0000 ug | Freq: Once | INTRAMUSCULAR | Status: AC
  Administered 2017-10-11: 100 ug via INTRAVENOUS

## 2017-10-11 MED ORDER — PIPERACILLIN-TAZOBACTAM 3.375 G IVPB
3.3750 g | Freq: Three times a day (TID) | INTRAVENOUS | Status: DC
  Administered 2017-10-11 – 2017-10-16 (×15): 3.375 g via INTRAVENOUS
  Filled 2017-10-11 (×16): qty 50

## 2017-10-11 MED ORDER — IOPAMIDOL (ISOVUE-300) INJECTION 61%
50.0000 mL | Freq: Once | INTRAVENOUS | Status: AC | PRN
  Administered 2017-10-11: 40 mL

## 2017-10-11 MED ORDER — SODIUM CHLORIDE 0.9% FLUSH: 10.0000 mL | INTRAVENOUS | Status: DC | PRN

## 2017-10-11 MED ORDER — MIDAZOLAM HCL 2 MG/2ML IJ SOLN
INTRAMUSCULAR | Status: AC
  Filled 2017-10-11: qty 2

## 2017-10-11 MED ORDER — CHLORHEXIDINE GLUCONATE CLOTH 2 % EX PADS
6.0000 | MEDICATED_PAD | Freq: Every day | CUTANEOUS | Status: DC
  Administered 2017-10-11 – 2017-10-27 (×17): 6 via TOPICAL

## 2017-10-11 MED ORDER — IOPAMIDOL (ISOVUE-300) INJECTION 61%
INTRAVENOUS | Status: AC
  Administered 2017-10-11: 40 mL
  Filled 2017-10-11: qty 50

## 2017-10-11 MED ORDER — MIDAZOLAM HCL 2 MG/2ML IJ SOLN
4.0000 mg | Freq: Once | INTRAMUSCULAR | Status: AC
  Administered 2017-10-11: 4 mg via INTRAVENOUS

## 2017-10-11 MED ORDER — DEXTROSE IN LACTATED RINGERS 5 % IV SOLN
INTRAVENOUS | Status: AC
  Administered 2017-10-11: 1000 mL via INTRAVENOUS

## 2017-10-11 MED ORDER — LIDOCAINE VISCOUS 2 % MT SOLN
15.0000 mL | Freq: Once | OROMUCOSAL | Status: AC
  Administered 2017-10-11: 3 mL via OROMUCOSAL
  Filled 2017-10-11: qty 15

## 2017-10-11 MED ORDER — ORAL CARE MOUTH RINSE
15.0000 mL | Freq: Four times a day (QID) | OROMUCOSAL | Status: DC
  Administered 2017-10-11 – 2017-10-27 (×49): 15 mL via OROMUCOSAL

## 2017-10-11 NOTE — Progress Notes (Signed)
Cortak Team Note   Attempted to place Cortrak tube; unable to direct cortrak tube into the esophagus. Tube trying to enter into the lungs on all attempts. Recommend IR placement of small bore feeding tube.   Betsey Holiday MS, RD, LDN Pager #- [phone] After Hours Pager: [phone]

## 2017-10-11 NOTE — Procedures (Signed)
Chest Tube Insertion Procedure Note  Indications:  Clinically significant Pneumothorax  Pre-operative Diagnosis: Pneumothorax  Post-operative Diagnosis: Pneumothorax  Procedure Details  Informed consent was obtained for the procedure, including sedation.  Risks of lung perforation, hemorrhage, arrhythmia, and adverse drug reaction were discussed.   After sterile skin prep, using standard technique, a Wayne catheter tube was placed in the right anterior 2 rib space.  Findings: None  Estimated Blood Loss:  Minimal         Specimens:  None              Complications:  None; patient tolerated the procedure well.         Disposition: ICU - intubated and hemodynamically stable.         Condition: stable  Attending Attestation: I was present for the entire procedure.  Alyson Reedy, M.D. [facility] Pulmonary/Critical Care Medicine. Pager: [phone]. After hours pager: [phone]

## 2017-10-11 NOTE — Progress Notes (Signed)
MD attempted update son and husband as listed on face page. Unable to reach either after several phone attempts.

## 2017-10-11 NOTE — Progress Notes (Signed)
Pt / R pneumo after coretrack attempts. MD aware and preparation for cook catheter underway.

## 2017-10-11 NOTE — Progress Notes (Signed)
PULMONARY / CRITICAL CARE MEDICINE   Name: Sally [name] MRN: [id number] DOB: July 29, 1951    ADMISSION DATE:  09/26/2017 CONSULTATION DATE:  09/26/2017  REFERRING MD:  Juleen China  CHIEF COMPLAINT:  dyspnea  HISTORY OF PRESENT ILLNESS:   67 year old female with a past medical history significant for severe COPD came to the emergency department on September 26, 2017 complaining of shortness of breath.  Her husband provides the history because she was intubated on my arrival.  He says that she continues to smoke cigarettes, greater than 1 pack/day.  She uses albuterol about 3 times a day but does not take any other inhalers.  She does not use oxygen.  About a week ago they had a cough or cold and were prescribed a Z-Pak.  She said that her symptoms persisted.  He denied fevers or chills.  She came to the emergency department and was initially treated with BiPAP but she developed worsening respiratory failure and required intubation. Respiratory panel returned influenza A positive  SUBJECTIVE:  Stable,no acute events overnight, chest x-ray and blood gas with interval improvement  VITAL SIGNS: BP (!) 133/55   Pulse 95   Temp 99.3 F (37.4 C) (Oral)   Resp 18   Ht 5\' 1"  (1.549 m)   Wt 163 lb 9.3 oz (74.2 kg)   SpO2 99%   BMI 30.91 kg/m   HEMODYNAMICS:    VENTILATOR SETTINGS: Vent Mode: PRVC FiO2 (%):  [40 %-100 %] 40 % Set Rate:  [18 bmp-24 bmp] 18 bmp Vt Set:  [500 mL] 500 mL PEEP:  [5 cmH20] 5 cmH20 Plateau Pressure:  [14 cmH20-20 cmH20] 15 cmH20  INTAKE / OUTPUT: I/O last 3 completed shifts: In: 2484.7 [I.V.:1116; NG/GT:1168.7; IV Piggyback:200] Out: 2605 [Urine:2605]  PHYSICAL EXAMINATION: General:  Alert and interactive, comfortable Neuro: trached, on vent, moving all ext to command HENT:  [place]/AT, PERRL, EOM-I and MMM PULM: CTAB CV: RRR, Nl S1/S2 and -M/R/G. GI: Soft, NT, ND and +BS MSK: normal bulk and tone  LABS:  BMET Recent Labs  Lab 10/09/17 0328 10/10/17 0319  10/11/17 0220  NA 139 136 137  K 4.8 4.2 3.8  CL 104 98* 101  CO2 28 28 24   BUN 33* 27* 17  CREATININE 0.48 0.50 0.50  GLUCOSE 148* 117* 160*   Electrolytes Recent Labs  Lab 10/07/17 1658 10/08/17 0423 10/09/17 0328 10/10/17 0319 10/11/17 0220  CALCIUM  --  8.4* 8.4* 8.1* 8.0*  MG 2.3 2.4  --  2.0  --   PHOS 3.9 3.3  --  4.1  --    CBC Recent Labs  Lab 10/08/17 0423 10/09/17 0711 10/10/17 0319  WBC 27.9* 24.0* 18.0*  HGB 10.4* 9.5* 9.7*  HCT 33.0* 29.6* 29.9*  PLT 200 198 208   Coag's Recent Labs  Lab 10/09/17 1116  INR 1.01    Sepsis Markers Recent Labs  Lab 10/07/17 1007  PROCALCITON 0.25    ABG Recent Labs  Lab 10/06/17 1610 10/10/17 0310 10/11/17 0449  PHART 7.417 7.522* 7.482*  PCO2ART 45.8 35.6 38.7  PO2ART 124* 102 150*    Liver Enzymes Recent Labs  Lab 10/08/17 0423  AST 24  ALT 69*  ALKPHOS 63  BILITOT 0.6  ALBUMIN 2.6*    Cardiac Enzymes No results for input(s): TROPONINI, PROBNP in the last 168 hours.  Glucose Recent Labs  Lab 10/10/17 0807 10/10/17 1157 10/10/17 1548 10/10/17 1950 10/10/17 2339 10/11/17 0329  GLUCAP 125* 124* 127* 137* 147* 134*  Imaging Dg Chest Port 1v Same Day  Result Date: 10/10/2017 CLINICAL DATA:  Tracheostomy placement. EXAM: PORTABLE CHEST 1 VIEW COMPARISON:  Chest x-ray from same day at 3:50 a.m. FINDINGS: Interval placement of a tracheostomy tube with the tip in good position, approximately 4.4 cm above the level of the carina. Interval removal of the enteric tube. The heart size and mediastinal contours are within normal limits. Normal pulmonary vascularity. Increased bibasilar atelectasis. No pleural effusion or pneumothorax. No acute osseous abnormality. IMPRESSION: 1. Interval placement of a tracheostomy tube in good position. 2. Increased bibasilar atelectasis. Electronically Signed   By: Obie Dredge M.D.   On: 10/10/2017 09:32         STUDIES:  Chest x-ray shows  endotracheal tube in place, emphysema   CULTURES: September 26, 2017 respiratory culture September 26, 2017 respiratory viral panel  ANTIBIOTICS: Levaquin d/ced 09/27/17 positive for Influenza A Ceftriaxone 09/30/17---- 10/05/17 for new RLL infiltrate   SIGNIFICANT EVENTS:     LINES/TUBES: January 1 endotracheal tube   DISCUSSION: 67 year old female with a past medical history significant for severe COPD admitted for a COPD exacerbation causing acute respiratory failure with hypoxemia necessitating mechanical ventilation.   ASSESSMENT / PLAN:   PULMONARY A: Acute respiratory failure with hypoxemia COPD exacerbation P:   Solu-Medrol decreased to 20mg  BID on 1/13, will taper off Albuterol every 4 hours pulmicort and brovana Full mechanical vent support VAP prevention Daily WUA/SBT Failed extubation reintubated on 10/03/17 Second failed extubation on 10/06/17  trached 1/15 Blood gas alkalosis improved from yesterday  Chest x-ray with improvement in bibasilar atelectasis    CARDIOVASCULAR A:  Sinus tach Some T wave inversions 09/28/17 early morning,  leads 2,3, avf   elevated troponin to 0.15 P:  Telemetry monitoring On diltiazem for HTN only no afib history troponins trending down last was 0.06 Continue asa Will need ischemic workup on discharge dilt and hydral for BP    RENAL A:   No acute issues P:   Cr stable Monitor BMET and UOP Replace electrolytes as needed Hypernatremia resolved continue free water    GASTROINTESTINAL A:   GERD P:   Pepcid Tube feeding to restart today Colace scheduled    HEMATOLOGIC A:   No acute issues P:  Monitor for bleeding   INFECTIOUS A:   COPD exacerbation P:   Respiratory viral shows influenza A H1 5 days of tamiflu received 5 days ceftriaxone prophylactically for new RLL infiltrate now resolved Pt feeling hot and cold intermittently mild fever documented about a day and a half ago, lungs do not look infected on x-ray,  will d/c foley and get urinalysis, will switch to purewick   ENDOCRINE A:   Some increase in blood glucose due to steroid increase P:   SSI + decreased to 8 u levemir now with steroid taper   NEUROLOGIC A:   trached, follows commands P:   PRN fentanyl  for pain, will add home vistaril for anxiety  FAMILY  - Updates: family updated on 1/15 that trach procedure went well    - Inter-disciplinary family meet or Palliative Care meeting due by:  day 7    Thornell Mule MD PGY-1 Internal Medicine Pager # [phone]  10/11/2017, 6:43 AM  Attending Note:  67 year old female with flu A and severe COPD presenting in respiratory failure.  Patient was trached on 1/15.  Was tolerating some weaning this AM but due to placement of an NGT patient was sedated and placed back  on the vent.  On exam, distant BS that are coarse.  I reviewed CXR myself, trach is in good position.  Will continue weaning efforts today.  Hold off diureses.  Continue to monitor off abx.  Replace electrolytes.  AM labs ordered.  If able to wean will consider a vent bed placement.  The patient is critically ill with multiple organ systems failure and requires high complexity decision making for assessment and support, frequent evaluation and titration of therapies, application of advanced monitoring technologies and extensive interpretation of multiple databases.   Critical Care Time devoted to patient care services described in this note is  35  Minutes. This time reflects time of care of this signee Dr Koren Bound. This critical care time does not reflect procedure time, or teaching time or supervisory time of PA/NP/Med student/Med Resident etc but could involve care discussion time.  Alyson Reedy, M.D. [facility] Pulmonary/Critical Care Medicine. Pager: [phone]. After hours pager: [phone].

## 2017-10-12 ENCOUNTER — Inpatient Hospital Stay (HOSPITAL_COMMUNITY): Payer: Medicare Other

## 2017-10-12 LAB — BASIC METABOLIC PANEL
Anion gap: 10 (ref 5–15)
BUN: 17 mg/dL (ref 6–20)
CALCIUM: 7.8 mg/dL — AB (ref 8.9–10.3)
CO2: 28 mmol/L (ref 22–32)
CREATININE: 0.51 mg/dL (ref 0.44–1.00)
Chloride: 100 mmol/L — ABNORMAL LOW (ref 101–111)
GFR calc Af Amer: >60 mL/min (ref >60)
GFR calc non Af Amer: >60 mL/min (ref >60)
GLUCOSE: 140 mg/dL — AB (ref 65–99)
Potassium: 3.4 mmol/L — ABNORMAL LOW (ref 3.5–5.1)
Sodium: 138 mmol/L (ref 135–145)

## 2017-10-12 LAB — CBC
HCT: 25.4 % — ABNORMAL LOW (ref 36.0–46.0)
Hemoglobin: 8.4 g/dL — ABNORMAL LOW (ref 12.0–15.0)
MCH: 30.8 pg (ref 26.0–34.0)
MCHC: 33.1 g/dL (ref 30.0–36.0)
MCV: 93 fL (ref 78.0–100.0)
PLATELETS: 211 10*3/uL (ref 150–400)
RBC: 2.73 MIL/uL — ABNORMAL LOW (ref 3.87–5.11)
RDW: 14.5 % (ref 11.5–15.5)
WBC: 16.6 10*3/uL — ABNORMAL HIGH (ref 4.0–10.5)

## 2017-10-12 LAB — GLUCOSE, CAPILLARY
GLUCOSE-CAPILLARY: 116 mg/dL — AB (ref 65–99)
GLUCOSE-CAPILLARY: 118 mg/dL — AB (ref 65–99)
GLUCOSE-CAPILLARY: 136 mg/dL — AB (ref 65–99)
GLUCOSE-CAPILLARY: 182 mg/dL — AB (ref 65–99)
Glucose-Capillary: 129 mg/dL — ABNORMAL HIGH (ref 65–99)
Glucose-Capillary: 146 mg/dL — ABNORMAL HIGH (ref 65–99)

## 2017-10-12 LAB — MAGNESIUM: MAGNESIUM: 1.9 mg/dL (ref 1.7–2.4)

## 2017-10-12 LAB — PROCALCITONIN: PROCALCITONIN: 0.24 ng/mL

## 2017-10-12 LAB — PHOSPHORUS: Phosphorus: 3.5 mg/dL (ref 2.5–4.6)

## 2017-10-12 MED ORDER — ACETAMINOPHEN 325 MG PO TABS
650.0000 mg | ORAL_TABLET | Freq: Four times a day (QID) | ORAL | Status: DC | PRN
  Administered 2017-10-12 (×2): 650 mg
  Filled 2017-10-12 (×2): qty 2

## 2017-10-12 MED ORDER — ACETAMINOPHEN 160 MG/5ML PO SOLN
650.0000 mg | Freq: Four times a day (QID) | ORAL | Status: DC | PRN
  Administered 2017-10-13: 650 mg
  Filled 2017-10-12: qty 20.3

## 2017-10-12 MED ORDER — ACETAMINOPHEN 650 MG RE SUPP: 650.0000 mg | Freq: Four times a day (QID) | RECTAL | Status: DC | PRN

## 2017-10-12 MED ORDER — HYDROXYZINE HCL 25 MG PO TABS
25.0000 mg | ORAL_TABLET | Freq: Three times a day (TID) | ORAL | Status: DC | PRN
  Filled 2017-10-12: qty 1

## 2017-10-12 MED ORDER — FENTANYL CITRATE (PF) 100 MCG/2ML IJ SOLN
25.0000 ug | INTRAMUSCULAR | Status: DC | PRN
  Administered 2017-10-12: 50 ug via INTRAVENOUS
  Filled 2017-10-12: qty 2

## 2017-10-12 MED ORDER — FENTANYL CITRATE (PF) 100 MCG/2ML IJ SOLN
25.0000 ug | INTRAMUSCULAR | Status: DC | PRN
  Administered 2017-10-12: 25 ug via INTRAVENOUS
  Filled 2017-10-12: qty 2

## 2017-10-12 MED ORDER — POTASSIUM CHLORIDE 20 MEQ/15ML (10%) PO SOLN
20.0000 meq | ORAL | Status: AC
  Administered 2017-10-12 (×2): 20 meq
  Filled 2017-10-12 (×2): qty 15

## 2017-10-12 MED ORDER — SENNOSIDES 8.8 MG/5ML PO SYRP
5.0000 mL | ORAL_SOLUTION | Freq: Every evening | ORAL | Status: DC | PRN
  Filled 2017-10-12: qty 5

## 2017-10-12 MED ORDER — INSULIN DETEMIR 100 UNIT/ML ~~LOC~~ SOLN
6.0000 [IU] | Freq: Every day | SUBCUTANEOUS | Status: DC
  Administered 2017-10-12 – 2017-10-16 (×5): 6 [IU] via SUBCUTANEOUS
  Filled 2017-10-12 (×5): qty 0.06

## 2017-10-12 NOTE — Progress Notes (Signed)
C/o 10/10 sharp pain on right side/flank. VSS, breath sounds diminished and unchanged. Chest tube previously flushed with 30 ml saline per order but only 11 ml returned in collection container. Harbrecht, MD notified and to place orders for pain medication and verbal order to hold flush at this time.

## 2017-10-12 NOTE — Progress Notes (Signed)
[facility] ADULT ICU REPLACEMENT PROTOCOL FOR AM LAB REPLACEMENT ONLY  The patient does apply for the [facility] Adult ICU Electrolyte Replacment Protocol based on the criteria listed below:   1. Is GFR >/= 40 ml/min? Yes.    Patient's GFR today is >60 2. Is urine output >/= 0.5 ml/kg/hr for the last 6 hours? Yes.   Patient's UOP is 0.77 ml/kg/hr 3. Is BUN < 60 mg/dL? Yes.    Patient's BUN today is 17 4. Abnormal electrolyte(s): Potassium 3.4 5. Ordered repletion with: Potassium per protocol 6. If a panic level lab has been reported, has the CCM MD in charge been notified? No..   Physician:    Thomasenia Bottoms 10/12/2017 6:10 AM

## 2017-10-12 NOTE — Progress Notes (Signed)
PULMONARY / CRITICAL CARE MEDICINE   Name: Sally [name] MRN: [id number] DOB: 10/03/1950    ADMISSION DATE:  09/26/2017 CONSULTATION DATE:  09/26/2017  REFERRING MD:  Juleen China  CHIEF COMPLAINT:  dyspnea  HISTORY OF PRESENT ILLNESS:   67 year old female with a past medical history significant for severe COPD came to the emergency department on September 26, 2017 complaining of shortness of breath.  Her husband provides the history because she was intubated on my arrival.  He says that she continues to smoke cigarettes, greater than 1 pack/day.  She uses albuterol about 3 times a day but does not take any other inhalers.  She does not use oxygen.  About a week ago they had a cough or cold and were prescribed a Z-Pak.  She said that her symptoms persisted.  He denied fevers or chills.  She came to the emergency department and was initially treated with BiPAP but she developed worsening respiratory failure and required intubation. Respiratory panel returned influenza A positive  SUBJECTIVE:  Stable, pain in chest at chest tube site and back overnight, x-ray shows chest tube in place and resolved pneumothorax, on humidified O2 this morning  VITAL SIGNS: BP (!) 123/54   Pulse 80   Temp 99.8 F (37.7 C) (Oral)   Resp (!) 21   Ht 5\' 1"  (1.549 m)   Wt 165 lb 5.5 oz (75 kg)   SpO2 98%   BMI 31.24 kg/m   HEMODYNAMICS:    VENTILATOR SETTINGS: Vent Mode: PRVC FiO2 (%):  [40 %] 40 % Set Rate:  [18 bmp] 18 bmp Vt Set:  [500 mL] 500 mL PEEP:  [5 cmH20] 5 cmH20 Pressure Support:  [10 cmH20] 10 cmH20 Plateau Pressure:  [13 cmH20-18 cmH20] 13 cmH20  INTAKE / OUTPUT: I/O last 3 completed shifts: In: 3001 [I.V.:2541; NG/GT:360; IV Piggyback:100] Out: 2295 [Urine:2295]  PHYSICAL EXAMINATION: General:  Alert and interactive,  Neuro: trached, humidified O2, moving all ext to command HENT:  [place]/AT, PERRL, EOM-I and MMM PULM: CTAB CV: RRR, Nl S1/S2 and -M/R/G. GI: Soft, NT, ND and +BS MSK: normal  bulk and tone  LABS:  BMET Recent Labs  Lab 10/10/17 0319 10/11/17 0220 10/12/17 0456  NA 136 137 138  K 4.2 3.8 3.4*  CL 98* 101 100*  CO2 28 24 28   BUN 27* 17 17  CREATININE 0.50 0.50 0.51  GLUCOSE 117* 160* 140*   Electrolytes Recent Labs  Lab 10/08/17 0423  10/10/17 0319 10/11/17 0220 10/12/17 0456  CALCIUM 8.4*   < > 8.1* 8.0* 7.8*  MG 2.4  --  2.0  --  1.9  PHOS 3.3  --  4.1  --  3.5   < > = values in this interval not displayed.   CBC Recent Labs  Lab 10/09/17 0711 10/10/17 0319 10/12/17 0456  WBC 24.0* 18.0* 16.6*  HGB 9.5* 9.7* 8.4*  HCT 29.6* 29.9* 25.4*  PLT 198 208 211   Coag's Recent Labs  Lab 10/09/17 1116  INR 1.01    Sepsis Markers Recent Labs  Lab 10/07/17 1007  PROCALCITON 0.25    ABG Recent Labs  Lab 10/10/17 0310 10/11/17 0449 10/12/17 0508  PHART 7.522* 7.482* 7.469*  PCO2ART 35.6 38.7 41  PO2ART 102 150* 111*    Liver Enzymes Recent Labs  Lab 10/08/17 0423  AST 24  ALT 69*  ALKPHOS 63  BILITOT 0.6  ALBUMIN 2.6*    Cardiac Enzymes No results for input(s): TROPONINI, PROBNP in the last  168 hours.  Glucose Recent Labs  Lab 10/11/17 0728 10/11/17 1119 10/11/17 1520 10/11/17 1940 10/11/17 2323 10/12/17 0345  GLUCAP 118* 158* 94 137* 100* 129*    Imaging Dg Abd 1 View  Result Date: 10/11/2017 CLINICAL DATA:  Feeding tube placement EXAM: ABDOMEN - 1 VIEW COMPARISON:  Earlier same day FINDINGS: C-arm images show soft feeding tube tip at the ligament of Treitz. Contrast is injected which shows jejunal filling. IMPRESSION: Soft feeding tube tip at the ligament of Treitz. Electronically Signed   By: Paulina Fusi M.D.   On: 10/11/2017 16:38   Dg Abd 1 View  Result Date: 10/11/2017 CLINICAL DATA:  Nasogastric placement. EXAM: ABDOMEN - 1 VIEW COMPARISON:  10/06/2017 FINDINGS: Soft feeding tube enters the right bronchial tree and perforates the lung into the right pleural space. Right pneumothorax estimated at  20%. This result is being called emergently. IMPRESSION: Soft feeding tube enters the airway, enters the right lung, perforates the right lung with its tip in the pleural space. 20% right pneumothorax. Critical value in the process of being notified. Electronically Signed   By: Paulina Fusi M.D.   On: 10/11/2017 12:42   Dg Chest Port 1 View  Result Date: 10/11/2017 CLINICAL DATA:  Chest tube placement EXAM: PORTABLE CHEST 1 VIEW COMPARISON:  Study obtained earlier in the day; October 07, 2017 FINDINGS: Chest tube is present on the right with the tip in the right apex region. Tracheostomy catheter tip is 5.5 cm above the carina. No pneumothorax. There is new somewhat rounded airspace consolidation in the right base. There is mild left base atelectasis. Lungs elsewhere clear. Heart size and pulmonary vascularity are normal. No adenopathy. There is aortic atherosclerosis. There is postoperative change in the proximal left humerus. IMPRESSION: Tube positions as described. No pneumothorax. New somewhat rounded opacity right base. Question pneumonia versus rounded atelectasis. Mild left base atelectasis. Lungs elsewhere clear. There is aortic atherosclerosis. Aortic Atherosclerosis (ICD10-I70.0). Electronically Signed   By: Bretta Bang III M.D.   On: 10/11/2017 14:06         STUDIES:  Chest x-ray shows endotracheal tube in place, emphysema   CULTURES: September 26, 2017 respiratory culture September 26, 2017 respiratory viral panel  ANTIBIOTICS: Levaquin d/ced 09/27/17 positive for Influenza A Ceftriaxone 09/30/17---- 10/05/17 for new RLL infiltrate   SIGNIFICANT EVENTS:     LINES/TUBES: January 1 endotracheal tube   DISCUSSION: 67 year old female with a past medical history significant for severe COPD admitted for a COPD exacerbation causing acute respiratory failure with hypoxemia necessitating mechanical ventilation.   ASSESSMENT / PLAN:   PULMONARY A: Acute respiratory failure with  hypoxemia COPD exacerbation P:   Solu-Medrol decreased to 20mg  BID on 1/13, will taper off Albuterol every 4 hours pulmicort and brovana VAP prevention Daily WUA/SBT Failed extubation reintubated on 10/03/17 Second failed extubation on 10/06/17  trached 1/15 Blood gas alkalosis improved from yesterday  Iatrogenic pneumo from core trac feeding tube attempt at placement on 1/16, chest tube placed shortly after on anterior right, chest tube with no air leak today, on humidified O2 through trach,  off vent    CARDIOVASCULAR A:  Sinus tach Some T wave inversions 09/28/17 early morning,  leads 2,3, avf   elevated troponin to 0.15 P:  Telemetry monitoring On diltiazem for HTN only no afib history troponins trending down last was 0.06 Continue asa Will need ischemic workup on discharge dilt and hydral for BP    RENAL A:   No acute issues  P:   Cr stable Monitor BMET and UOP Replace electrolytes as needed Hypernatremia resolved continue free water    GASTROINTESTINAL A:   GERD P:   Pepcid Tube feeding restarted after IR placed feeding tube  Colace scheduled    HEMATOLOGIC A:   No acute issues P:  Monitor for bleeding   INFECTIOUS A:   COPD exacerbation P:   Respiratory viral shows influenza A H1 5 days of tamiflu received 5 days ceftriaxone prophylactically for new RLL infiltrate now resolved D/ced foley yesterday Day 2 of pip tazo for iatrogenic pneumo from feeding tube with suspicion of aspiration   ENDOCRINE A:   Some increase in blood glucose due to steroid increase P:   SSI + decreased to 6 u levemir now with steroid taper   NEUROLOGIC A:   trached, follows commands P:   PRN fentanyl  for pain,added home vistaril for anxiety  FAMILY  - Updates: family updated on 1/15 that trach procedure went well    - Inter-disciplinary family meet or Palliative Care meeting due by:  day 7    Thornell Mule MD PGY-1 Internal Medicine Pager #  [phone]  10/12/2017, 6:45 AM

## 2017-10-12 NOTE — Progress Notes (Signed)
RT note: patient placed on 40% trach collar.  Currently tolerating well.  Will continue to monitor.  

## 2017-10-13 ENCOUNTER — Inpatient Hospital Stay (HOSPITAL_COMMUNITY): Payer: Medicare Other

## 2017-10-13 DIAGNOSIS — J939 Pneumothorax, unspecified: Secondary | ICD-10-CM

## 2017-10-13 LAB — BASIC METABOLIC PANEL
Anion gap: 9 (ref 5–15)
BUN: 17 mg/dL (ref 6–20)
CO2: 28 mmol/L (ref 22–32)
Calcium: 8 mg/dL — ABNORMAL LOW (ref 8.9–10.3)
Chloride: 105 mmol/L (ref 101–111)
Creatinine, Ser: 0.52 mg/dL (ref 0.44–1.00)
GFR calc Af Amer: >60 mL/min (ref >60)
GFR calc non Af Amer: >60 mL/min (ref >60)
GLUCOSE: 128 mg/dL — AB (ref 65–99)
POTASSIUM: 3.3 mmol/L — AB (ref 3.5–5.1)
Sodium: 142 mmol/L (ref 135–145)

## 2017-10-13 LAB — GLUCOSE, CAPILLARY
GLUCOSE-CAPILLARY: 189 mg/dL — AB (ref 65–99)
GLUCOSE-CAPILLARY: 134 mg/dL — AB (ref 65–99)
Glucose-Capillary: 114 mg/dL — ABNORMAL HIGH (ref 65–99)
Glucose-Capillary: 126 mg/dL — ABNORMAL HIGH (ref 65–99)
Glucose-Capillary: 112 mg/dL — ABNORMAL HIGH (ref 65–99)
Glucose-Capillary: 108 mg/dL — ABNORMAL HIGH (ref 65–99)

## 2017-10-13 LAB — PROCALCITONIN: Procalcitonin: 0.18 ng/mL

## 2017-10-13 MED ORDER — ASPIRIN 81 MG PO CHEW
81.0000 mg | CHEWABLE_TABLET | Freq: Every day | ORAL | Status: DC
  Administered 2017-10-14 – 2017-10-22 (×9): 81 mg
  Filled 2017-10-13 (×9): qty 1

## 2017-10-13 MED ORDER — FAMOTIDINE 40 MG/5ML PO SUSR
20.0000 mg | Freq: Two times a day (BID) | ORAL | Status: DC
  Administered 2017-10-13 – 2017-10-22 (×20): 20 mg
  Filled 2017-10-13 (×20): qty 2.5

## 2017-10-13 MED ORDER — HYDROXYZINE HCL 25 MG PO TABS
25.0000 mg | ORAL_TABLET | Freq: Three times a day (TID) | ORAL | Status: DC | PRN
Start: 2017-10-13 — End: 2017-10-13
  Filled 2017-10-13: qty 1

## 2017-10-13 MED ORDER — PREDNISONE 10 MG PO TABS
10.0000 mg | ORAL_TABLET | Freq: Every day | ORAL | Status: DC
  Administered 2017-10-26 – 2017-10-27 (×2): 10 mg via ORAL
  Filled 2017-10-13 (×2): qty 1

## 2017-10-13 MED ORDER — DILTIAZEM 12 MG/ML ORAL SUSPENSION
30.0000 mg | Freq: Three times a day (TID) | ORAL | Status: DC
  Administered 2017-10-13 – 2017-10-23 (×30): 30 mg
  Filled 2017-10-13 (×31): qty 3

## 2017-10-13 MED ORDER — ACETAMINOPHEN 160 MG/5ML PO SOLN
650.0000 mg | Freq: Four times a day (QID) | ORAL | Status: DC | PRN
  Administered 2017-10-15 – 2017-10-21 (×12): 650 mg
  Filled 2017-10-13 (×12): qty 20.3

## 2017-10-13 MED ORDER — SODIUM CHLORIDE 0.9 % IV SOLN
INTRAVENOUS | Status: DC
  Administered 2017-10-13: 12:00:00 via INTRAVENOUS

## 2017-10-13 MED ORDER — PREDNISONE 20 MG PO TABS
30.0000 mg | ORAL_TABLET | Freq: Every day | ORAL | Status: AC
  Administered 2017-10-16 – 2017-10-20 (×5): 30 mg via ORAL
  Filled 2017-10-13 (×6): qty 1

## 2017-10-13 MED ORDER — PREDNISONE 20 MG PO TABS
20.0000 mg | ORAL_TABLET | Freq: Every day | ORAL | Status: AC
  Administered 2017-10-21 – 2017-10-25 (×5): 20 mg via ORAL
  Filled 2017-10-13 (×5): qty 1

## 2017-10-13 MED ORDER — POTASSIUM CHLORIDE 10 MEQ/100ML IV SOLN: 10.0000 meq | INTRAVENOUS | Status: DC

## 2017-10-13 MED ORDER — PREDNISONE 20 MG PO TABS
40.0000 mg | ORAL_TABLET | Freq: Every day | ORAL | Status: AC
  Administered 2017-10-14 – 2017-10-15 (×2): 40 mg
  Filled 2017-10-13 (×2): qty 2

## 2017-10-13 MED ORDER — VITAL AF 1.2 CAL PO LIQD
1000.0000 mL | ORAL | Status: DC
  Administered 2017-10-13 – 2017-10-20 (×9): 1000 mL
  Filled 2017-10-13 (×14): qty 1000

## 2017-10-13 MED ORDER — PREDNISONE 10 MG PO TABS: 5.0000 mg | ORAL_TABLET | Freq: Every day | ORAL | Status: DC

## 2017-10-13 MED ORDER — LORAZEPAM 2 MG/ML IJ SOLN
0.5000 mg | INTRAMUSCULAR | Status: DC | PRN
  Administered 2017-10-13 – 2017-10-15 (×4): 0.5 mg via INTRAVENOUS
  Filled 2017-10-13 (×4): qty 1

## 2017-10-13 NOTE — Clinical Social Work Note (Signed)
Clinical Social Work Assessment  Patient Details  Name: Sally [name] MRN: [id number] Date of Birth: [birth year]  Date of referral:  10/13/17               Reason for consult:  Facility Placement                Permission sought to share information with:  Family Supports Permission granted to share information::  Yes, Verbal Permission Granted  Name::     Phenix Grein.   Agency::  family   Relationship::  son   Contact Information:  Tansy Lorek [phone]  Housing/Transportation Living arrangements for the past 2 months:  Single Family Home(with husband ) Source of Information:  Adult Children(pt's son ) Patient Interpreter Needed:  None Criminal Activity/Legal Involvement Pertinent to Current Situation/Hospitalization:  No - Comment as needed Significant Relationships:  Adult Children, Spouse Lives with:  Spouse Do you feel safe going back to the place where you live?  Yes Need for family participation in patient care:  Yes (Comment)  Care giving concerns:  CSW spoke with pt's son via phone. CSW was informed that at this time pt an family have no concerns. CSW sought to gather further information from pt' son on their thoughts of potential out of state placement for pt if pt is unable to wean from vent.    Social Worker assessment / plan:  CSW spoke with pt's son via phone. During this time CSW was informed that pt is from home with husband. Son reports that pt and husband have been married for 42 years and have lived together ever since. During this assessment son seemed to be agreeable to plan of care if pt is needing placement into a vent/snf facility. Son reports that pt has no other support other than him and pt's husband at this time.   Employment status:  Other (Comment)(unknown at this time.) Insurance information:  Managed Medicare([employer]) PT Recommendations:  Not assessed at this time Information / Referral to community resources:     Patient/Family's  Response to care:  Pt's son seems to be agreeable to plan of care if the need comes for pt to need out of state placement. Son reports that out of state is the last option but if that is what pt's need is then he's understanding of that.   Patient/Family's Understanding of and Emotional Response to Diagnosis, Current Treatment, and Prognosis:  At this time no further CSW needs have been identified and no further questions or concerns have been presented to CSW.   Emotional Assessment Appearance:  Other (Comment Required(spoke with son via phone. ) Attitude/Demeanor/Rapport:  Unable to Assess Affect (typically observed):  Unable to Assess Orientation:  (intubated adn unable to assess at this time. ) Alcohol / Substance use:  Not Applicable Psych involvement (Current and /or in the community):  No (Comment)  Discharge Needs  Concerns to be addressed:  No discharge needs identified Readmission within the last 30 days:  No Current discharge risk:  None Barriers to Discharge:  Continued Medical Work up   [employer], LCSWA 10/13/2017, 8:00 AM

## 2017-10-13 NOTE — Evaluation (Signed)
[facility] - Evaluation Patient Details  Name: Sally [name] MRN: [id number] Date of Birth: 16-Jul-1951  Today's Date: 10/13/2017 Time: 4098-1191 SLP Time Calculation (min) (ACUTE ONLY): 21 min  Past Medical History:  Past Medical History:  Diagnosis Date  . Asthma   . Back pain   . COPD (chronic obstructive pulmonary disease) (HCC)   . Crohn disease (HCC)   . Hypertension    Past Surgical History:  Past Surgical History:  Procedure Laterality Date  . ABDOMINAL HYSTERECTOMY    . CATARACT EXTRACTION, BILATERAL    . FRACTURE SURGERY Left    ARM   HPI:  67 year old female with a past medical history significant for severe COPDcame to the emergency department on 09/26/2017 complaining of shortness of breath. Intubated on arrival. Two failed extubations: 1/8 and 1/11. Trach placed 1/15.    Assessment / Plan / Recommendation Clinical Impression   Pt currently not demonstrating upper airway patency, indicated by back pressure with PMV trials, aphonia with finger occlusion, and ability to tolerate PMV for only a few breath cycles. Pt tolerating cuff deflation, with VS maintaining throughout trial. Pt with productive cough, expelling secretions from trach. Suctioning provided around trach. Inability to attain upper airway patency likely related to swelling, presence of cuff, size of trach, and/or amount of secretions impacting up airway. Pt likley to benefit from trach downsize when medically appropriate. Recommend PMV use only when SLP is present. SLP will continue to follow to determine readiness for PMV.   SLP Visit Diagnosis: Aphonia (R49.1)    SLP Assessment  Patient needs continued Speech Lanaguage Pathology Services    Follow Up Recommendations  (tbd)    Frequency and Duration min 2x/week  2 weeks    PMSV Trial PMSV was placed for: few breath cycles Able to redirect subglottic air through upper airway: No Able to Attain Phonation: No Voice Quality:  Aphonic Able to Expectorate Secretions: Yes Level of Secretion Expectoration with PMSV: Tracheal Breath Support for Phonation: Inadequate Intelligibility: Not tested Respirations During Trial: 25 SpO2 During Trial: 97 % Pulse During Trial: 87 Behavior: Alert;Cooperative   Tracheostomy Tube       Vent Dependency  FiO2 (%): 28 %    Cuff Deflation Trial  GO Tolerated Cuff Deflation: Yes Length of Time for Cuff Deflation Trial: 15 min Behavior: Alert;Cooperative      [place] Cielle Aguila SLP Student Clinician    [place] Keandria Berrocal 10/13/2017, 11:28 AM

## 2017-10-13 NOTE — Progress Notes (Addendum)
8:08am- CSW received call back from number listed in chart as pt's son. Pt's son expressed that he thought CSW was another individual attempting to reach him for another cause. CSW spoke with son about out of state placement if needed. Son is agreeable but also expressed that this is the last option for pt.   CSW has been attempting to contact pt's family listed in facesheet. CSW has been unable to speak with family as requested. CSW contacted pt's son listed in the chart once more this morning. Individual that answered phone informed CSW that CSW had the wrong number.    Sally [name], MSW, LCSW-A Emergency Department Clinical Social Worker [phone]

## 2017-10-13 NOTE — Care Management Note (Signed)
Case Management Note  Patient Details  Name: Sally [name] MRN: [id number] Date of Birth: 1951-06-02  Subjective/Objective:                   Pt admitted with respiratory failure secondary to the flu  Action/Plan:   PTA independent from home with husband.  Pt had to be re-intubated 10/03/17.  CM will continue to follow for discharge needs   Expected Discharge Date:                  Expected Discharge Plan:  (From home with husband)  In-House Referral:     Discharge planning Services  CM Consult  Post Acute Care Choice:    Choice offered to:     DME Arranged:    DME Agency:     HH Arranged:    HH Agency:     Status of Service:     If discussed at [employer], dates discussed:    Additional Comments: 10/13/2017  Pt is now s/p trach and has successfully been on TC for greater than 24 hours.  CSW following for probable SNF placement. PT eval ordered   10/10/17 Pt remains intubated - plan is for trach 1/15.  CSW reaching out to pts family to discuss the possibility of out of state placement if pt is unable to liberate from vent.    Cherylann Parr, RN 10/13/2017, 2:44 PM

## 2017-10-13 NOTE — Progress Notes (Signed)
Speech-Language Pathology Note:  Chart reviewed as part of trach team. Note that pt has been doing TC trials. Recommend MD consider ordering PMV evaluation.  Maxcine Ham, M.A. CCC-SLP [phone]

## 2017-10-13 NOTE — Progress Notes (Signed)
PULMONARY / CRITICAL CARE MEDICINE   Name: Sally [name] MRN: [id number] DOB: November 25, 1950    ADMISSION DATE:  09/26/2017 CONSULTATION DATE:  09/26/2017  REFERRING MD:  Juleen China  CHIEF COMPLAINT:  dyspnea  HISTORY OF PRESENT ILLNESS:   67 year old female with a past medical history significant for severe COPD came to the emergency department on September 26, 2017 complaining of shortness of breath.  Her husband provides the history because she was intubated on my arrival.  He says that she continues to smoke cigarettes, greater than 1 pack/day.  She uses albuterol about 3 times a day but does not take any other inhalers.  She does not use oxygen.  About a week ago they had a cough or cold and were prescribed a Z-Pak.  She said that her symptoms persisted.  He denied fevers or chills.  She came to the emergency department and was initially treated with BiPAP but she developed worsening respiratory failure and required intubation. Respiratory panel returned influenza A positive  SUBJECTIVE:  Some pleuritic chest pain. Breathing better. Still with productive, weak cough.   VITAL SIGNS: BP (!) 129/50   Pulse 86   Temp 98.9 F (37.2 C) (Oral)   Resp (!) 25   Ht 5\' 1"  (1.549 m)   Wt 75.2 kg (165 lb 12.6 oz)   SpO2 97%   BMI 31.32 kg/m   HEMODYNAMICS:    VENTILATOR SETTINGS: FiO2 (%):  [28 %-35 %] 28 %  INTAKE / OUTPUT: I/O last 3 completed shifts: In: 2445 [I.V.:395; NG/GT:1800; IV Piggyback:250] Out: 1661 [Urine:1650; Chest Tube:11]  PHYSICAL EXAMINATION: General: adult female in NAD on trach collar Neuro: Alert, interactive, non-focal HENT:  [place]/AT, PERRL, EOM-I and MMM PULM: Scattered rhonchi CV: NSR on monitor. RRR, no MRG GI: Soft, NT, ND and +BS MSK: normal bulk and tone  LABS:  BMET Recent Labs  Lab 10/11/17 0220 10/12/17 0456 10/13/17 0500  NA 137 138 142  K 3.8 3.4* 3.3*  CL 101 100* 105  CO2 24 28 28   BUN 17 17 17   CREATININE 0.50 0.51 0.52  GLUCOSE 160* 140*  128*   Electrolytes Recent Labs  Lab 10/08/17 0423  10/10/17 0319 10/11/17 0220 10/12/17 0456 10/13/17 0500  CALCIUM 8.4*   < > 8.1* 8.0* 7.8* 8.0*  MG 2.4  --  2.0  --  1.9  --   PHOS 3.3  --  4.1  --  3.5  --    < > = values in this interval not displayed.   CBC Recent Labs  Lab 10/09/17 0711 10/10/17 0319 10/12/17 0456  WBC 24.0* 18.0* 16.6*  HGB 9.5* 9.7* 8.4*  HCT 29.6* 29.9* 25.4*  PLT 198 208 211   Coag's Recent Labs  Lab 10/09/17 1116  INR 1.01    Sepsis Markers Recent Labs  Lab 10/07/17 1007 10/12/17 1034 10/13/17 0500  PROCALCITON 0.25 0.24 0.18    ABG Recent Labs  Lab 10/10/17 0310 10/11/17 0449 10/12/17 0508  PHART 7.522* 7.482* 7.469*  PCO2ART 35.6 38.7 41  PO2ART 102 150* 111*    Liver Enzymes Recent Labs  Lab 10/08/17 0423  AST 24  ALT 69*  ALKPHOS 63  BILITOT 0.6  ALBUMIN 2.6*    Cardiac Enzymes No results for input(s): TROPONINI, PROBNP in the last 168 hours.  Glucose Recent Labs  Lab 10/12/17 1132 10/12/17 1537 10/12/17 1932 10/12/17 2342 10/13/17 0340 10/13/17 0736  GLUCAP 182* 146* 118* 136* 114* 126*    Imaging No results  found.   STUDIES:  Chest x-ray shows endotracheal tube in place, emphysema   CULTURES: September 26, 2017 respiratory culture September 26, 2017 respiratory viral panel  ANTIBIOTICS: Levaquin d/ced 09/27/17 positive for Influenza A Ceftriaxone 09/30/17---- 10/05/17 for new RLL infiltrate   SIGNIFICANT EVENTS:     LINES/TUBES: January 1 endotracheal tube   DISCUSSION: 67 year old female with a past medical history significant for severe COPD admitted for a COPD exacerbation causing acute respiratory failure with hypoxemia necessitating mechanical ventilation. She was unable to liberate from vent (failed extubation twice) and was ultimately given tracheostomy. Course complicated by iatrogenic PTX during Cortrak tube placement. Chest tube placed and now on waterseal. She is tolerating ATC  well.    ASSESSMENT / PLAN:    Acute on chronic respiratory failure with hypoxemia - ATC as 24/7 if tolerated. (24 hours straight at this point) - Pulmonary hygiene - Currently managing secretions well, but if worsens will need chest PT and potentially hypertonic nebs. (her cough is weak)  COPD with acute exacerbation - Solumedrol transitioned to prednisone taper - Albuterol PRN - pulmicort and brovana  Pneumothorax, Right Iatrogenic during NGT placement.  - CT remains to waterseal.  - Will repeat CXR now and if PTX resolved still will remove CT.  Joneen Roach, AGACNP-BC [facility] Pulmonology/Critical Care Pager [phone] or [phone]  10/13/2017 10:31 AM

## 2017-10-13 NOTE — Progress Notes (Signed)
CSW spoke with pt's who called to inform CSW that out of state placement is not an option despite pt's son being agreeable to it. At this time CSW made aware that pt may not be in need of out of state placement as pt has been off vent for the past 24 hours. CSW spoke with pt's husband to relieve some stress of needing out of state placement and husband is now wanting to know if he can take pt home with trach. CSW explained that he would have to talk with doctor or RN about this. CSW remains available for support at this time.     Claude Manges Ka Flammer, MSW, LCSW-A Emergency Department Clinical Social Worker [phone]

## 2017-10-13 NOTE — Progress Notes (Signed)
[place] TEAM 1 - Stepdown/ICU TEAM  Sally [name]  WPY:[id number] DOB: November 25, 1950 DOA: 09/26/2017 PCP: [name], MD    Brief Narrative:  67 year old female with a history of severe COPDand 1PPD ongoing tobacco abuse who presented on September 26, 2017 w/ shortness of breath. She was initially treated with BiPAP but developed worsening respiratory failure and required intubation. Respiratory panel returned influenza A positive.  Significant Events: 1/1 admitted - intubated  1/15 tracheostomy  1/16 PTX during NG placement attempts > R chest tube   Subjective: Pt is dealing with significant secretions via her trach.  She is however doing well on trach collar oxygen only and is no longer requiring the ventilator.  There is no evidence of acute respiratory distress or uncontrolled pain at the time of my visit.  Assessment & Plan:  Acute on chronic hypoxic respiratory failure  trach dependent but liberated from vent for >24hrs - trach/resp care per PCCM  Influenza A Has completed a full tx course   COPD with acute exacerbation Care as per PCCM - no wheezing today   Pneumothorax, Right Iatrogenic during NGT placement.  Chest tube to be removed today per PCCM   RLL infiltrate PCCM placed on Zosyn empirically  Hypokalemia  Replace and follow   GERD  DVT prophylaxis: SQ heparin  Code Status: FULL CODE Family Communication: spoke w/ husbnd in hallway Disposition Plan: transfer to SDU   Consultants:  PCCM  Antimicrobials:  Zosyn 1/16 > Ceftriaxone 1/5 > 1/9 Tamiflu 1/2 > 1/6  Objective: Blood pressure (!) 143/58, pulse 82, temperature 98.8 F (37.1 C), temperature source Oral, resp. rate 19, height 5' 1"  (1.549 m), weight 75.2 kg (165 lb 12.6 oz), SpO2 95 %.  Intake/Output Summary (Last 24 hours) at 10/13/2017 1149 Last data filed at 10/13/2017 1100 Gross per 24 hour  Intake 1490 ml  Output 1550 ml  Net -60 ml   Filed Weights   10/11/17 0400 10/12/17 0449  10/13/17 0500  Weight: 74.2 kg (163 lb 9.3 oz) 75 kg (165 lb 5.5 oz) 75.2 kg (165 lb 12.6 oz)    Examination: General: No acute respiratory distress - heavy secretions via trach  Lungs: Clear to auscultation bilaterally without wheezes or crackles - course upper airway sounds th/o  Cardiovascular: Regular rate and rhythm without murmur  Abdomen: Nontender, nondistended, soft, bowel sounds positive Extremities: trace B LE edema   CBC: Recent Labs  Lab 10/07/17 0713 10/08/17 0423 10/09/17 0711 10/10/17 0319 10/12/17 0456  WBC 29.6* 27.9* 24.0* 18.0* 16.6*  NEUTROABS  --   --  19.6*  --   --   HGB 10.9* 10.4* 9.5* 9.7* 8.4*  HCT 33.9* 33.0* 29.6* 29.9* 25.4*  MCV 93.1 94.0 93.1 91.2 93.0  PLT 196 200 198 208 053   Basic Metabolic Panel: Recent Labs  Lab 10/06/17 1708  10/07/17 1658 10/08/17 0423 10/09/17 0328 10/10/17 0319 10/11/17 0220 10/12/17 0456 10/13/17 0500  NA  --    < >  --  141 139 136 137 138 142  K  --    < >  --  4.4 4.8 4.2 3.8 3.4* 3.3*  CL  --    < >  --  105 104 98* 101 100* 105  CO2  --    < >  --  26 28 28 24 28 28   GLUCOSE  --    < >  --  171* 148* 117* 160* 140* 128*  BUN  --    < >  --  37* 33* 27* 17 17 17   CREATININE  --    < >  --  0.55 0.48 0.50 0.50 0.51 0.52  CALCIUM  --    < >  --  8.4* 8.4* 8.1* 8.0* 7.8* 8.0*  MG 2.2  --  2.3 2.4  --  2.0  --  1.9  --   PHOS 3.8  --  3.9 3.3  --  4.1  --  3.5  --    < > = values in this interval not displayed.   GFR: Estimated Creatinine Clearance: 65.1 mL/min (by C-G formula based on SCr of 0.52 mg/dL).  Liver Function Tests: Recent Labs  Lab 10/08/17 0423  AST 24  ALT 69*  ALKPHOS 63  BILITOT 0.6  PROT 5.4*  ALBUMIN 2.6*    Coagulation Profile: Recent Labs  Lab 10/09/17 1116  INR 1.01   CBG: Recent Labs  Lab 10/12/17 1932 10/12/17 2342 10/13/17 0340 10/13/17 0736 10/13/17 1115  GLUCAP 118* 136* 114* 126* 189*    Recent Results (from the past 240 hour(s))  Culture,  respiratory (NON-Expectorated)     Status: None   Collection Time: 10/04/17 12:18 PM  Result Value Ref Range Status   Specimen Description TRACHEAL ASPIRATE  Final   Special Requests NONE  Final   Gram Stain   Final    RARE WBC PRESENT, PREDOMINANTLY PMN NO ORGANISMS SEEN    Culture   Final    RARE STAPHYLOCOCCUS SPECIES (COAGULASE NEGATIVE) CALL MICROBIOLOGY LAB IF SENSITIVITIES ARE REQUIRED.    Report Status 10/08/2017 FINAL  Final     Scheduled Meds: . arformoterol  15 mcg Nebulization BID  . aspirin  81 mg Oral Daily  . budesonide (PULMICORT) nebulizer solution  0.5 mg Nebulization BID  . chlorhexidine gluconate (MEDLINE KIT)  15 mL Mouth Rinse BID  . Chlorhexidine Gluconate Cloth  6 each Topical Daily  . diltiazem  30 mg Oral Q8H  . famotidine  20 mg Per Tube BID  . feeding supplement (PRO-STAT SUGAR FREE 64)  30 mL Per Tube Daily  . feeding supplement (VITAL HIGH PROTEIN)  1,000 mL Per Tube Q24H  . free water  100 mL Per Tube Q8H  . heparin  5,000 Units Subcutaneous Q8H  . insulin aspart  0-15 Units Subcutaneous Q4H  . insulin detemir  6 Units Subcutaneous Daily  . mouth rinse  15 mL Mouth Rinse QID  . midazolam  4 mg Intravenous Once  . nicotine  21 mg Transdermal Daily  . predniSONE  40 mg Oral Q breakfast   Followed by  . [START ON 10/16/2017] predniSONE  30 mg Oral Q breakfast   Followed by  . [START ON 10/21/2017] predniSONE  20 mg Oral Q breakfast   Followed by  . [START ON 10/26/2017] predniSONE  10 mg Oral Q breakfast   Followed by  . [START ON 10/31/2017] predniSONE  5 mg Oral Q breakfast     LOS: 17 days   [name], MD [facility] Office  [phone] Pager - Text Page per Amion as per below:  On-Call/Text Page:      [email or web address]      password [email or web address]  If 7PM-7AM, please contact night-coverage [email or web address] Password [facility] 10/13/2017, 11:49 AM

## 2017-10-13 NOTE — Progress Notes (Signed)
Nutrition Follow-up  DOCUMENTATION CODES:   Not applicable  INTERVENTION:   Change TF regimen to better meet re-estimated needs:  Vital AF 1.2 at 50 ml/h (1200 ml per day)  Provides 1440 kcal, 90 gm protein, 973 ml free water daily  NUTRITION DIAGNOSIS:   Inadequate oral intake related to inability to eat as evidenced by NPO status.  Ongoing  GOAL:   Provide needs based on ASPEN/SCCM guidelines  Met with TF  MONITOR:   Vent status, TF tolerance, Labs, I & O's  ASSESSMENT:   66 yo female with PMH of COPD, HTN, Crohn's DZ, asthma who was admitted on 1/1 with COPD exacerbation, requiring intubation.  Discussed patient in ICU rounds and with RN today. S/P trach on 1/15. Currently on trach collar. Patient is currently receiving Vital High Protein via small bore feeding tube (placed by IR on 1/16) at 40 ml/h (960 ml/day) with Prostat 30 ml once daily to provide 1060 kcals, 99 gm protein, 803 ml free water daily. Free water flushes 100 ml TID.  Labs reviewed. Potassium 3.3 (L) CBG's: 114-126-189 Medications reviewed.  Diet Order:  Diet NPO time specified  EDUCATION NEEDS:   No education needs have been identified at this time  Skin:  Skin Assessment: Reviewed RN Assessment  Last BM:  1/12  Height:   Ht Readings from Last 1 Encounters:  09/26/17 5' 1" (1.549 m)    Weight:   Wt Readings from Last 1 Encounters:  10/13/17 165 lb 12.6 oz (75.2 kg)    Ideal Body Weight:  47.7 kg  BMI:  Body mass index is 31.32 kg/m.  Estimated Nutritional Needs:   Kcal:  1300-1500  Protein:  75-90 gm  Fluid:  1.5 L    , RD, LDN, CNSC Pager [phone] After Hours Pager [phone]  

## 2017-10-13 NOTE — Progress Notes (Signed)
Patient seen for trach team follow up.  All needed equipment at the bedside.  No education needed at this time.  Will continue to follow for progression.  

## 2017-10-14 LAB — GLUCOSE, CAPILLARY
GLUCOSE-CAPILLARY: 101 mg/dL — AB (ref 65–99)
GLUCOSE-CAPILLARY: 191 mg/dL — AB (ref 65–99)
Glucose-Capillary: 110 mg/dL — ABNORMAL HIGH (ref 65–99)
Glucose-Capillary: 118 mg/dL — ABNORMAL HIGH (ref 65–99)
Glucose-Capillary: 108 mg/dL — ABNORMAL HIGH (ref 65–99)

## 2017-10-14 LAB — COMPREHENSIVE METABOLIC PANEL
ALT: 118 U/L — ABNORMAL HIGH (ref 14–54)
AST: 36 U/L (ref 15–41)
Albumin: 2.4 g/dL — ABNORMAL LOW (ref 3.5–5.0)
Alkaline Phosphatase: 95 U/L (ref 38–126)
Anion gap: 11 (ref 5–15)
BUN: 20 mg/dL (ref 6–20)
CHLORIDE: 106 mmol/L (ref 101–111)
CO2: 23 mmol/L (ref 22–32)
Calcium: 8.1 mg/dL — ABNORMAL LOW (ref 8.9–10.3)
Creatinine, Ser: 0.47 mg/dL (ref 0.44–1.00)
GFR calc Af Amer: >60 mL/min (ref >60)
Glucose, Bld: 111 mg/dL — ABNORMAL HIGH (ref 65–99)
POTASSIUM: 3.5 mmol/L (ref 3.5–5.1)
Sodium: 140 mmol/L (ref 135–145)
Total Bilirubin: 0.7 mg/dL (ref 0.3–1.2)
Total Protein: 5.5 g/dL — ABNORMAL LOW (ref 6.5–8.1)

## 2017-10-14 LAB — CBC
HEMATOCRIT: 29.4 % — AB (ref 36.0–46.0)
HEMOGLOBIN: 9.7 g/dL — AB (ref 12.0–15.0)
MCH: 30.6 pg (ref 26.0–34.0)
MCHC: 33 g/dL (ref 30.0–36.0)
MCV: 92.7 fL (ref 78.0–100.0)
Platelets: 283 10*3/uL (ref 150–400)
RBC: 3.17 MIL/uL — ABNORMAL LOW (ref 3.87–5.11)
RDW: 14.4 % (ref 11.5–15.5)
WBC: 21.5 10*3/uL — AB (ref 4.0–10.5)

## 2017-10-14 MED ORDER — POTASSIUM CHLORIDE 20 MEQ/15ML (10%) PO SOLN
40.0000 meq | Freq: Two times a day (BID) | ORAL | Status: AC
  Administered 2017-10-14 (×2): 40 meq
  Filled 2017-10-14 (×2): qty 30

## 2017-10-14 NOTE — Evaluation (Signed)
Physical Therapy Evaluation Patient Details Name: Sally [name] MRN: [id number] DOB: 01-26-1951 Today's Date: 10/14/2017   History of Present Illness  Pt is a 67 y/o female admitted secondary to a COPD exacerbation and +Flu. Pt required intubation  Clinical Impression  Pt presented supine in bed with HOB elevated, initially asleep but easily aroused and willing to participate in therapy session. No family or caregivers were present to provide any information regarding PLOF or home environment during this evaluation. Pt currently very limited and required heavy physical assistance of two for bed mobility and transfers. Pt will require post acute rehab prior to returning home. Pt would continue to benefit from skilled physical therapy services at this time while admitted and after d/c to address the below listed limitations in order to improve overall safety and independence with functional mobility.  All VSS throughout.     Follow Up Recommendations SNF;Supervision/Assistance - 24 hour    Equipment Recommendations  None recommended by PT    Recommendations for Other Services       Precautions / Restrictions Precautions Precautions: Fall Precaution Comments: NG, rectal tube, trach Restrictions Weight Bearing Restrictions: No      Mobility  Bed Mobility Overal bed mobility: Needs Assistance Bed Mobility: Rolling;Sidelying to Sit;Sit to Supine Rolling: Max assist;+2 for physical assistance Sidelying to sit: Max assist;+2 for physical assistance   Sit to supine: Max assist;+2 for physical assistance   General bed mobility comments: increased time and effort, vc'ing for sequencing, assist with bilateral LEs and trunk  Transfers Overall transfer level: Needs assistance Equipment used: 2 person hand held assist Transfers: Sit to/from Stand Sit to Stand: Max assist;+2 physical assistance         General transfer comment: increased time and effort, pt with difficulty  maintaining grasp of therapists' arms for support, PT blocking L knee for precaution; pt unable to achieve full erect standing position, maintaining a flexed posture. Pt performed sit<>stand from EOB x2  Ambulation/Gait                Stairs            Wheelchair Mobility    Modified Rankin (Stroke Patients Only)       Balance Overall balance assessment: Needs assistance Sitting-balance support: Feet supported Sitting balance-Leahy Scale: Poor Sitting balance - Comments: progressing from mod A to close min guard Postural control: Posterior lean Standing balance support: During functional activity;Bilateral upper extremity supported Standing balance-Leahy Scale: Poor                               Pertinent Vitals/Pain Pain Assessment: Faces Faces Pain Scale: Hurts even more Pain Location: L shoulder, around trach Pain Descriptors / Indicators: Grimacing;Guarding Pain Intervention(s): Monitored during session;Repositioned    Home Living Family/patient expects to be discharged to:: Unsure                 Additional Comments: Pt with trach in place, no family caregivers to provide any information    Prior Function           Comments: unsure     Hand Dominance        Extremity/Trunk Assessment   Upper Extremity Assessment Upper Extremity Assessment: Defer to OT evaluation    Lower Extremity Assessment Lower Extremity Assessment: Generalized weakness(pt with drop foot bilaterally, no active ankle DF bilat)       Communication   Communication: Tracheostomy  Cognition  Arousal/Alertness: Awake/alert Behavior During Therapy: Flat affect Overall Cognitive Status: Difficult to assess                                 General Comments: Difficult to fully assess cognition, but pt able to follow simple commands throughout      General Comments      Exercises     Assessment/Plan    PT Assessment Patient needs  continued PT services  PT Problem List Decreased strength;Decreased range of motion;Decreased activity tolerance;Decreased balance;Decreased mobility;Decreased coordination;Decreased cognition;Decreased knowledge of use of DME;Decreased safety awareness;Decreased knowledge of precautions;Cardiopulmonary status limiting activity;Pain       PT Treatment Interventions DME instruction;Gait training;Functional mobility training;Therapeutic activities;Therapeutic exercise;Balance training;Neuromuscular re-education;Cognitive remediation;Patient/family education    PT Goals (Current goals can be found in the Care Plan section)  Acute Rehab PT Goals Patient Stated Goal: unable to state PT Goal Formulation: Patient unable to participate in goal setting Time For Goal Achievement: 10/28/17 Potential to Achieve Goals: Fair    Frequency Min 2X/week   Barriers to discharge        Co-evaluation PT/OT/SLP Co-Evaluation/Treatment: Yes Reason for Co-Treatment: Complexity of the patient's impairments (multi-system involvement);For patient/therapist safety;To address functional/ADL transfers PT goals addressed during session: Mobility/safety with mobility;Balance;Strengthening/ROM         AM-PAC PT "6 Clicks" Daily Activity  Outcome Measure Difficulty turning over in bed (including adjusting bedclothes, sheets and blankets)?: Unable Difficulty moving from lying on back to sitting on the side of the bed? : Unable Difficulty sitting down on and standing up from a chair with arms (e.g., wheelchair, bedside commode, etc,.)?: Unable Help needed moving to and from a bed to chair (including a wheelchair)?: Total Help needed walking in hospital room?: Total Help needed climbing 3-5 steps with a railing? : Total 6 Click Score: 6    End of Session Equipment Utilized During Treatment: Gait belt;Oxygen;Other (comment)(trach collar) Activity Tolerance: Patient limited by fatigue;Patient limited by  pain Patient left: in bed;with call bell/phone within reach;with bed alarm set;with SCD's reapplied;Other (comment)(PRAFO's donned bilaterally) Nurse Communication: Mobility status;Need for lift equipment PT Visit Diagnosis: Other abnormalities of gait and mobility (R26.89);Muscle weakness (generalized) (M62.81)    Time: 5883-2549 PT Time Calculation (min) (ACUTE ONLY): 36 min   Charges:   PT Evaluation $PT Eval Moderate Complexity: 1 Mod     PT G Codes:        [place], PT, DPT [phone]   Alessandra Bevels Mithcell Schumpert 10/14/2017, 10:56 AM

## 2017-10-14 NOTE — Evaluation (Signed)
Occupational Therapy Evaluation Patient Details Name: Sally [name] MRN: [id number] DOB: 06-19-1951 Today's Date: 10/14/2017    History of Present Illness Pt is a 67 y/o female admitted secondary to a COPD exacerbation and +Flu. Pt required intubation   Clinical Impression   This 67 yo female admitted for above presents to acute OT with decreased endurance, generalized weakness, decreased balance and inability to stand at present with +2 A. She will benefit from acute OT with follow up OT at SNF post D/C to work towards a more independent level.    Follow Up Recommendations  SNF;Supervision/Assistance - 24 hour    Equipment Recommendations  Other (comment)(TBD at next venue)       Precautions / Restrictions Precautions Precautions: Fall Precaution Comments: NG, rectal tube, trach Restrictions Weight Bearing Restrictions: No      Mobility Bed Mobility Overal bed mobility: Needs Assistance Bed Mobility: Rolling;Sidelying to Sit;Sit to Supine Rolling: Max assist;+2 for physical assistance Sidelying to sit: Max assist;+2 for physical assistance   Sit to supine: Max assist;+2 for physical assistance   General bed mobility comments: increased time and effort, vc'ing for sequencing, assist with bilateral LEs and trunk  Transfers Overall transfer level: Needs assistance Equipment used: 2 person hand held assist Transfers: Sit to/from Stand Sit to Stand: Max assist;+2 physical assistance         General transfer comment: increased time and effort, pt with difficulty maintaining grasp of therapists' arms for support, PT blocking L knee for precaution; pt unable to achieve full erect standing position, maintaining a flexed posture. Pt performed sit<>stand from EOB x2    Balance Overall balance assessment: Needs assistance Sitting-balance support: Feet supported Sitting balance-Leahy Scale: Poor Sitting balance - Comments: progressing from mod A to close min guard Postural  control: Posterior lean Standing balance support: During functional activity;Bilateral upper extremity supported Standing balance-Leahy Scale: Poor                             ADL either performed or assessed with clinical judgement   ADL                                         General ADL Comments: total A at present for all BADLs     Vision   Additional Comments: tended to keep eyes closed or only open minimally during session. When I did ask her to reach for my hand it was slow but she was on target            Pertinent Vitals/Pain Pain Assessment: Faces Faces Pain Scale: Hurts even more Pain Location: L shoulder, around trach Pain Descriptors / Indicators: Grimacing;Guarding Pain Intervention(s): Monitored during session;Repositioned     Hand Dominance  right   Extremity/Trunk Assessment Upper Extremity Assessment Upper Extremity Assessment: Generalized weakness   Lower Extremity Assessment Lower Extremity Assessment: Generalized weakness(pt with drop foot bilaterally, no active ankle DF bilat)       Communication Communication Communication: Tracheostomy   Cognition Arousal/Alertness: Awake/alert Behavior During Therapy: Flat affect Overall Cognitive Status: Difficult to assess                                 General Comments: Difficult to fully assess cognition, but pt able to follow simple commands throughout  Home Living Family/patient expects to be discharged to:: Unsure                                 Additional Comments: Pt with trach in place, no family caregivers to provide any information      Prior Functioning/Environment          Comments: unsure        OT Problem List: Decreased strength;Decreased activity tolerance;Impaired balance (sitting and/or standing);Pain;Impaired UE functional use;Obesity;Decreased knowledge of use of DME or AE      OT  Treatment/Interventions: Self-care/ADL training;Balance training;Therapeutic activities;Therapeutic exercise;DME and/or AE instruction;Patient/family education    OT Goals(Current goals can be found in the care plan section) Acute Rehab OT Goals Patient Stated Goal: unable to state--did shake her head OK to working on moving with therapy OT Goal Formulation: With patient Time For Goal Achievement: 10/28/17 Potential to Achieve Goals: Fair  OT Frequency: Min 2X/week           Co-evaluation PT/OT/SLP Co-Evaluation/Treatment: Yes Reason for Co-Treatment: Complexity of the patient's impairments (multi-system involvement);For patient/therapist safety;To address functional/ADL transfers PT goals addressed during session: Mobility/safety with mobility;Balance;Strengthening/ROM OT goals addressed during session: ADL's and self-care;Strengthening/ROM      AM-PAC PT "6 Clicks" Daily Activity     Outcome Measure Help from another person eating meals?: Total Help from another person taking care of personal grooming?: Total Help from another person toileting, which includes using toliet, bedpan, or urinal?: Total Help from another person bathing (including washing, rinsing, drying)?: Total Help from another person to put on and taking off regular upper body clothing?: Total Help from another person to put on and taking off regular lower body clothing?: Total 6 Click Score: 6   End of Session Equipment Utilized During Treatment: Gait belt Nurse Communication: (pt bed wet and dirty (we A'd RN in getting pt cleaned up))  Activity Tolerance: Patient limited by lethargy;Patient limited by fatigue Patient left: in bed;with bed alarm set(chair position)  OT Visit Diagnosis: Other abnormalities of gait and mobility (R26.89);Muscle weakness (generalized) (M62.81);Pain Pain - Right/Left: Left Pain - part of body: Shoulder(and neck where trach is)                Time: 1610-9604 OT Time Calculation  (min): 36 min Charges:  OT General Charges $OT Visit: 1 Visit OT Evaluation $OT Eval Moderate Complexity: [address], [place] [phone] 10/14/2017

## 2017-10-14 NOTE — Progress Notes (Signed)
[facility] TEAM 1 - Stepdown/ICU TEAM  Sally [name]  AYO:[id number] DOB: [birth year] DOA: 09/26/2017 PCP: [name], MD    Brief Narrative:  67 year old female with a history of severe COPDand 1PPD ongoing tobacco abuse who presented on September 26, 2017 w/ shortness of breath. She was initially treated with BiPAP but developed worsening respiratory failure and required intubation. Respiratory panel returned influenza A positive.  Significant Events: 1/1 admitted - intubated  1/15 tracheostomy  1/16 PTX during NG placement attempts > R chest tube   Subjective: Has remained on Tbar all night.  Alert and conversant.  Is anxious to be allowed to eat/drink.  Denies cp, sob, n/v, or abdom pain.  Is hungry.    Assessment & Plan:  Acute on chronic hypoxic respiratory failure  Has now been liberated from vent for >48hrs - trach/resp care per PCCM  Influenza A Has completed a full tx course   COPD with acute exacerbation Care as per PCCM - appears to have stabilized    Pneumothorax, Right Iatrogenic during NGT placement.  Chest tube removed 1/18 per PCCM   RLL infiltrate PCCM placed on Zosyn empirically - afebrile - WBC remains elevated, but pt on steroid  Hypokalemia  Cont to replace w/ goal of 4  GERD  DVT prophylaxis: SQ heparin  Code Status: FULL CODE Family Communication: spoke w/ son at bedside  Disposition Plan: transfer to SDU - PT/OT - SLP eval   Consultants:  PCCM  Antimicrobials:  Zosyn 1/16 > Ceftriaxone 1/5 > 1/9 Tamiflu 1/2 > 1/6  Objective: Blood pressure (!) 143/55, pulse 87, temperature 98.8 F (37.1 C), temperature source Oral, resp. rate (!) 28, height 5' 1"  (1.549 m), weight 73.2 kg (161 lb 6 oz), SpO2 100 %.  Intake/Output Summary (Last 24 hours) at 10/14/2017 1007 Last data filed at 10/14/2017 0400 Gross per 24 hour  Intake 1074.34 ml  Output 1800 ml  Net -725.66 ml   Filed Weights   10/12/17 0449 10/13/17 0500 10/14/17 0500  Weight:  75 kg (165 lb 5.5 oz) 75.2 kg (165 lb 12.6 oz) 73.2 kg (161 lb 6 oz)    Examination: General: No acute respiratory distress - less secretions in trach today  Lungs: Clear to auscultation bilaterally - no wheezing  Cardiovascular: RRR - no M or rub   Abdomen: Nontender, nondistended, soft, BS+ Extremities: trace B LE edema w/o change   CBC: Recent Labs  Lab 10/08/17 0423 10/09/17 0711 10/10/17 0319 10/12/17 0456 10/14/17 0442  WBC 27.9* 24.0* 18.0* 16.6* 21.5*  NEUTROABS  --  19.6*  --   --   --   HGB 10.4* 9.5* 9.7* 8.4* 9.7*  HCT 33.0* 29.6* 29.9* 25.4* 29.4*  MCV 94.0 93.1 91.2 93.0 92.7  PLT 200 198 208 211 239   Basic Metabolic Panel: Recent Labs  Lab 10/07/17 1658 10/08/17 0423  10/10/17 0319 10/11/17 0220 10/12/17 0456 10/13/17 0500 10/14/17 0442  NA  --  141   < > 136 137 138 142 140  K  --  4.4   < > 4.2 3.8 3.4* 3.3* 3.5  CL  --  105   < > 98* 101 100* 105 106  CO2  --  26   < > 28 24 28 28 23   GLUCOSE  --  171*   < > 117* 160* 140* 128* 111*  BUN  --  37*   < > 27* 17 17 17 20   CREATININE  --  0.55   < >  0.50 0.50 0.51 0.52 0.47  CALCIUM  --  8.4*   < > 8.1* 8.0* 7.8* 8.0* 8.1*  MG 2.3 2.4  --  2.0  --  1.9  --   --   PHOS 3.9 3.3  --  4.1  --  3.5  --   --    < > = values in this interval not displayed.   GFR: Estimated Creatinine Clearance: 64.2 mL/min (by C-G formula based on SCr of 0.47 mg/dL).  Liver Function Tests: Recent Labs  Lab 10/08/17 0423 10/14/17 0442  AST 24 36  ALT 69* 118*  ALKPHOS 63 95  BILITOT 0.6 0.7  PROT 5.4* 5.5*  ALBUMIN 2.6* 2.4*    Coagulation Profile: Recent Labs  Lab 10/09/17 1116  INR 1.01   CBG: Recent Labs  Lab 10/13/17 1505 10/13/17 1943 10/13/17 2316 10/14/17 0350 10/14/17 0759  GLUCAP 134* 112* 108* 110* 118*    Recent Results (from the past 240 hour(s))  Culture, respiratory (NON-Expectorated)     Status: None   Collection Time: 10/04/17 12:18 PM  Result Value Ref Range Status   Specimen  Description TRACHEAL ASPIRATE  Final   Special Requests NONE  Final   Gram Stain   Final    RARE WBC PRESENT, PREDOMINANTLY PMN NO ORGANISMS SEEN    Culture   Final    RARE STAPHYLOCOCCUS SPECIES (COAGULASE NEGATIVE) CALL MICROBIOLOGY LAB IF SENSITIVITIES ARE REQUIRED.    Report Status 10/08/2017 FINAL  Final     Scheduled Meds: . arformoterol  15 mcg Nebulization BID  . aspirin  81 mg Per Tube Daily  . budesonide (PULMICORT) nebulizer solution  0.5 mg Nebulization BID  . chlorhexidine gluconate (MEDLINE KIT)  15 mL Mouth Rinse BID  . Chlorhexidine Gluconate Cloth  6 each Topical Daily  . diltiazem  30 mg Per Tube Q8H  . famotidine  20 mg Per Tube BID  . free water  100 mL Per Tube Q8H  . heparin  5,000 Units Subcutaneous Q8H  . insulin aspart  0-15 Units Subcutaneous Q4H  . insulin detemir  6 Units Subcutaneous Daily  . mouth rinse  15 mL Mouth Rinse QID  . nicotine  21 mg Transdermal Daily  . predniSONE  40 mg Per Tube Q breakfast   Followed by  . [START ON 10/16/2017] predniSONE  30 mg Oral Q breakfast   Followed by  . [START ON 10/21/2017] predniSONE  20 mg Oral Q breakfast   Followed by  . [START ON 10/26/2017] predniSONE  10 mg Oral Q breakfast   Followed by  . [START ON 10/31/2017] predniSONE  5 mg Oral Q breakfast     LOS: 18 days   [name], MD [facility] Office  [phone] Pager - Text Page per Amion as per below:  On-Call/Text Page:      [email or web address]      password [email or web address]  If 7PM-7AM, please contact night-coverage [email or web address] Password [email or web address] 10/14/2017, 10:07 AM

## 2017-10-15 LAB — GLUCOSE, CAPILLARY
GLUCOSE-CAPILLARY: 100 mg/dL — AB (ref 65–99)
GLUCOSE-CAPILLARY: 101 mg/dL — AB (ref 65–99)
GLUCOSE-CAPILLARY: 102 mg/dL — AB (ref 65–99)
GLUCOSE-CAPILLARY: 107 mg/dL — AB (ref 65–99)
Glucose-Capillary: 140 mg/dL — ABNORMAL HIGH (ref 65–99)
Glucose-Capillary: 142 mg/dL — ABNORMAL HIGH (ref 65–99)
Glucose-Capillary: 99 mg/dL (ref 65–99)

## 2017-10-15 LAB — BASIC METABOLIC PANEL
ANION GAP: 11 (ref 5–15)
BUN: 20 mg/dL (ref 6–20)
CHLORIDE: 108 mmol/L (ref 101–111)
CO2: 20 mmol/L — ABNORMAL LOW (ref 22–32)
Calcium: 8.2 mg/dL — ABNORMAL LOW (ref 8.9–10.3)
Creatinine, Ser: 0.47 mg/dL (ref 0.44–1.00)
GFR calc Af Amer: >60 mL/min (ref >60)
GFR calc non Af Amer: >60 mL/min (ref >60)
GLUCOSE: 116 mg/dL — AB (ref 65–99)
POTASSIUM: 4.2 mmol/L (ref 3.5–5.1)
Sodium: 139 mmol/L (ref 135–145)

## 2017-10-15 MED ORDER — LORAZEPAM 2 MG/ML PO CONC
0.5000 mg | ORAL | Status: DC | PRN
  Administered 2017-10-15 – 2017-10-22 (×18): 0.5 mg
  Filled 2017-10-15 (×18): qty 1

## 2017-10-15 NOTE — Progress Notes (Signed)
SLP Cancellation Note  Patient Details Name: Elainna Jackovich MRN: [id number] DOB: 09-02-1951   Cancelled treatment:       Reason Eval/Treat Not Completed: Patient not medically ready. Swallow orders received; pt currently tolerating PMV for a few breath cycles only. Will defer swallow evaluation at this time.  Rondel Baton, [place], CCC-SLP Speech-Language Pathologist [phone]   Arlana Lindau 10/15/2017, 8:47 AM

## 2017-10-15 NOTE — Progress Notes (Signed)
  Speech Language Pathology Treatment: [name] Speaking valve  Patient Details Name: Sally [name] MRN: [id number] DOB: [birth year] Today's Date: 10/15/2017 Time: 0813-0828 SLP Time Calculation (min) (ACUTE ONLY): 15 min  Assessment / Plan / Recommendation Clinical Impression  Patient seen for skilled intervention for toleration of PMV. RT present in room, reports attempting placement PMV attempted yesterday and pt unable to produce phonation. Reports frequent suctioning of secretions. Cuff is deflated at baseline. SLP repositioned pt upright to maximize posture for breath support for phonation. With initial valve placement and mod verbal cues, pt able to attain brief, weak, hoarse phonation, and cough with oral expectoration of secretions, which pt swallowed before SLP could suction orally. Valve placed for brief periods of 3-5 breath cycles; back pressure noted with removal of PMV, indicating inadequate upper airway patency. Remainder of speaking attempts aphonic. Ceased trials as pt's respiratory rate climbed from the mid 20s to 32. With reduction in secretions, downsizing or change to cuffless trach when appropriate, anticipate improvements in toleration of PMV and upper airway patency. Continue use of PMV with SLP only at this time. D/w RN.    HPI HPI: 67 year old female with a past medical history significant for severe COPDcame to the emergency department on 09/26/2017 complaining of shortness of breath. Intubated on arrival. Two failed extubations: 1/8 and 1/11. Trach placed 1/15.       SLP Plan  Continue with current plan of care       Recommendations  Diet recommendations: NPO      Patient may use Passy-Muir Speech Valve: with SLP only PMSV Supervision: Full MD: Please consider changing trach tube to : Smaller size;Cuffless         Oral Care Recommendations: Oral care QID Follow up Recommendations: Other (comment)(tbd) SLP Visit Diagnosis: Aphonia (R49.1) Plan: Continue  with current plan of care       GO               Rondel Baton, MS, CCC-SLP Speech-Language Pathologist [phone]  Arlana Lindau 10/15/2017, 8:36 AM

## 2017-10-15 NOTE — Progress Notes (Signed)
[place] TEAM 1 - Stepdown/ICU TEAM  Sally [name]  IOM:[id number] DOB: Apr 24, 1951 DOA: 09/26/2017 PCP: [name], MD    Brief Narrative:  67 year old female with a history of severe COPDand 1PPD ongoing tobacco abuse who presented on September 26, 2017 w/ shortness of breath. She was initially treated with BiPAP but developed worsening respiratory failure and required intubation. Respiratory panel returned influenza A positive.  Significant Events: 1/1 admitted - intubated  1/15 tracheostomy  1/16 PTX during NG placement attempts > R chest tube   Subjective: The patient is resting comfortably in bed.  She does complain of diffuse body aches.  She is also quite anxious.  There is no evidence of respiratory distress.  She is alert and interactive though she cannot speak with her trach.  Assessment & Plan:  Acute on chronic hypoxic respiratory failure  Trach collar only at this time - weaning to RA as able - trach/resp care per PCCM  Influenza A Has completed a full tx course   COPD with acute exacerbation Care as per PCCM - appears to have stabilized - steroids being slowly weaned   Pneumothorax, Right Iatrogenic during NGT placement.  Chest tube removed 1/18 per PCCM - stable on exam - f/u CXR in AM   RLL infiltrate PCCM placed on Zosyn empirically - afebrile - WBC remains elevated, likely due to steroid - no other clinical signs of infection presently   Hypokalemia  Corrected   GERD  DVT prophylaxis: SQ heparin  Code Status: FULL CODE Family Communication: No family present at time of exam today Disposition Plan: transfer to SDU - PT/OT - SLP eval   Consultants:  PCCM  Antimicrobials:  Zosyn 1/16 > Ceftriaxone 1/5 > 1/9 Tamiflu 1/2 > 1/6  Objective: Blood pressure (!) 125/49, pulse 84, temperature 98.9 F (37.2 C), temperature source Oral, resp. rate (!) 27, height 5' 1"  (1.549 m), weight 74.5 kg (164 lb 3.9 oz), SpO2 100 %.  Intake/Output Summary  (Last 24 hours) at 10/15/2017 0925 Last data filed at 10/15/2017 0600 Gross per 24 hour  Intake 1450 ml  Output 1000 ml  Net 450 ml   Filed Weights   10/13/17 0500 10/14/17 0500 10/15/17 0401  Weight: 75.2 kg (165 lb 12.6 oz) 73.2 kg (161 lb 6 oz) 74.5 kg (164 lb 3.9 oz)    Examination: General: No acute respiratory distress - alert  Lungs: poor air movement B bases - no wheezing   Cardiovascular: RRR  Abdomen: NT/ND, soft, BS+ Extremities: trace B LE edema - no erythema   CBC: Recent Labs  Lab 10/09/17 0711 10/10/17 0319 10/12/17 0456 10/14/17 0442  WBC 24.0* 18.0* 16.6* 21.5*  NEUTROABS 19.6*  --   --   --   HGB 9.5* 9.7* 8.4* 9.7*  HCT 29.6* 29.9* 25.4* 29.4*  MCV 93.1 91.2 93.0 92.7  PLT 198 208 211 845   Basic Metabolic Panel: Recent Labs  Lab 10/10/17 0319 10/11/17 0220 10/12/17 0456 10/13/17 0500 10/14/17 0442 10/15/17 0258  NA 136 137 138 142 140 139  K 4.2 3.8 3.4* 3.3* 3.5 4.2  CL 98* 101 100* 105 106 108  CO2 28 24 28 28 23  20*  GLUCOSE 117* 160* 140* 128* 111* 116*  BUN 27* 17 17 17 20 20   CREATININE 0.50 0.50 0.51 0.52 0.47 0.47  CALCIUM 8.1* 8.0* 7.8* 8.0* 8.1* 8.2*  MG 2.0  --  1.9  --   --   --   PHOS 4.1  --  3.5  --   --   --    GFR: Estimated Creatinine Clearance: 64.7 mL/min (by C-G formula based on SCr of 0.47 mg/dL).  Liver Function Tests: Recent Labs  Lab 10/14/17 0442  AST 36  ALT 118*  ALKPHOS 95  BILITOT 0.7  PROT 5.5*  ALBUMIN 2.4*    Coagulation Profile: Recent Labs  Lab 10/09/17 1116  INR 1.01   CBG: Recent Labs  Lab 10/14/17 1641 10/14/17 2013 10/15/17 0003 10/15/17 0357 10/15/17 0758  GLUCAP 191* 108* 99 100* 107*     Scheduled Meds: . arformoterol  15 mcg Nebulization BID  . aspirin  81 mg Per Tube Daily  . budesonide (PULMICORT) nebulizer solution  0.5 mg Nebulization BID  . chlorhexidine gluconate (MEDLINE KIT)  15 mL Mouth Rinse BID  . Chlorhexidine Gluconate Cloth  6 each Topical Daily  .  diltiazem  30 mg Per Tube Q8H  . famotidine  20 mg Per Tube BID  . free water  100 mL Per Tube Q8H  . heparin  5,000 Units Subcutaneous Q8H  . insulin aspart  0-15 Units Subcutaneous Q4H  . insulin detemir  6 Units Subcutaneous Daily  . mouth rinse  15 mL Mouth Rinse QID  . nicotine  21 mg Transdermal Daily  . predniSONE  40 mg Per Tube Q breakfast   Followed by  . [START ON 10/16/2017] predniSONE  30 mg Oral Q breakfast   Followed by  . [START ON 10/21/2017] predniSONE  20 mg Oral Q breakfast   Followed by  . [START ON 10/26/2017] predniSONE  10 mg Oral Q breakfast   Followed by  . [START ON 10/31/2017] predniSONE  5 mg Oral Q breakfast     LOS: 19 days   [name], MD [facility] Office  [phone] Pager - Text Page per Amion as per below:  On-Call/Text Page:      [email or web address]      password [email or web address]  If 7PM-7AM, please contact night-coverage [email or web address] Password [email or web address] 10/15/2017, 9:25 AM

## 2017-10-16 LAB — GLUCOSE, CAPILLARY
GLUCOSE-CAPILLARY: 107 mg/dL — AB (ref 65–99)
GLUCOSE-CAPILLARY: 111 mg/dL — AB (ref 65–99)
GLUCOSE-CAPILLARY: 113 mg/dL — AB (ref 65–99)
GLUCOSE-CAPILLARY: 98 mg/dL (ref 65–99)
Glucose-Capillary: 134 mg/dL — ABNORMAL HIGH (ref 65–99)
Glucose-Capillary: 159 mg/dL — ABNORMAL HIGH (ref 65–99)

## 2017-10-16 MED ORDER — GI COCKTAIL ~~LOC~~
30.0000 mL | Freq: Three times a day (TID) | ORAL | Status: DC | PRN
  Administered 2017-10-17: 30 mL
  Filled 2017-10-16 (×3): qty 30

## 2017-10-16 NOTE — Plan of Care (Signed)
  Progressing SLP PMSV Goals Patient will utilize PMSV for minutes without signs Description Patient will utilize PMSV for minutes without signs of distress or decline in vital signs with 10/16/2017 1221 - Progressing by Colon Flattery B, CCC-SLP Flowsheets Taken 10/16/2017 1221  Patient will utilize PMSV for 15 minutes without signs of distress or decline in vital signs with mod assist 15 min 10/16/2017 1220 - Progressing by Lorre Nick, CCC-SLP SLP Dysphagia Goals Patient will demonstrate readiness for PO's Description Patient will demonstrate readiness for PO's and/or instrumental swallow study as evidenced by: 10/16/2017 1221 - Progressing by Colon Flattery B, CCC-SLP Flowsheets Taken 10/16/2017 1221  Patient will demonstrate readiness for PO's and/or instrumental swallow study as evidenced by sustained attention to PO trials;with min assist 10/16/2017 1220 - Progressing by Lorre Nick, [place]   Completed/Met SLP PMSV Goals Patient will utilize PMSV for minutes without signs Description Patient will utilize PMSV for 5 minutes without signs of distress or decline in vital signs with 10/16/2017 1220 - Completed/Met by Colon Flattery B, CCC-SLP

## 2017-10-16 NOTE — Progress Notes (Signed)
  Speech Language Pathology Treatment: Sally [name] Speaking valve  Patient Details Name: Sally [name] MRN: [id number] DOB: [birth year] Today's Date: 10/16/2017 Time: 1120-1200 SLP Time Calculation (min) (ACUTE ONLY): 40 min  Assessment / Plan / Recommendation Clinical Impression  Pt seen at bedside for PMSV trials and education. RT suctioned pt prior to placement of speaking valve. Pt tolerated 2 trials of PMSV placement for a total of 9 minutes. During this time, limited swallow evaluation was completed. Pt heart rate, O2 sats and respiratory rate remained steady throughout both trials, however, pt was very anxious and requested removal of the valve after only a few minutes. RN and SLP encouraged pt to continue to try and keep PMSV in place for increased communicative effectiveness and more normalized swallow. Pt unable to produce vocalization with valve in place. Voice quality was very hoarse and with low intensity. Intelligibility is reduced, however, pt uses gestures to augment communication attempts.  No evidence of air stacking was noted upon removal of PMSV either time. ST will continue to provide skilled ST treatment for increased length of PSMV trials and education, and continued assessment for po readiness.     HPI HPI: 68 year old female with a past medical history significant for severe COPDcame to the emergency department on 09/26/2017 complaining of shortness of breath. Intubated on arrival. Two failed extubations: 1/8 and 1/11. Trach placed 1/15.       SLP Plan  Continue with current plan of care       Recommendations  Diet recommendations: NPO      Patient may use Passy-Muir Speech Valve: with SLP only PMSV Supervision: Full MD: Please consider changing trach tube to : Smaller size;Cuffless         Oral Care Recommendations: Oral care QID Follow up Recommendations: Other (comment)(TBD) SLP Visit Diagnosis: Aphonia (R49.1) Plan: Continue with current plan of  care            Sally [name] [facility], CCC-SLP Speech Language Pathologist [phone]  Sally [name] 10/16/2017, 11:59 AM

## 2017-10-16 NOTE — Evaluation (Signed)
Clinical/Bedside Swallow Evaluation Patient Details  Name: Sally [name] MRN: [id number] Date of Birth: April 20, 1951  Today's Date: 10/16/2017 Time: SLP Start Time (ACUTE ONLY): 1120 SLP Stop Time (ACUTE ONLY): 1200 SLP Time Calculation (min) (ACUTE ONLY): 40 min  Past Medical History:  Past Medical History:  Diagnosis Date  . Asthma   . Back pain   . COPD (chronic obstructive pulmonary disease) (HCC)   . Crohn disease (HCC)   . Hypertension    Past Surgical History:  Past Surgical History:  Procedure Laterality Date  . ABDOMINAL HYSTERECTOMY    . CATARACT EXTRACTION, BILATERAL    . FRACTURE SURGERY Left    ARM   HPI:  67 year old female with a past medical history significant for severe COPDcame to the emergency department on 09/26/2017 complaining of shortness of breath. Intubated on arrival. Two failed extubations: 1/8 and 1/11. Trach placed 1/15.    Assessment / Plan / Recommendation Clinical Impression  Oral care completed with suction. Pt noted to be edentulous. PMSV in place during this assessment. Pt accepted trials of ice chips with facial grimace noted during the swallow, although pt denies pain. Delayed cough was noted after po presentation. Pt's voice quality is significantly abnormal at this time - breathy/hoarse, low volume. Pt cough is strong - coughed up large amount of mucus with valve in place. Unfortunately, pt anxiety interferes with [name] success at this time. She was unable/unwilling to keep the valve on after 4-5 minutes. Additional po trials were deferred at this time, due to anxiety, fatigue, and aspiration risk.   SLP will continue to assess for po readiness. Upon next visit, will request RN provide meds for anxiety prior to PMSV placement.    SLP Visit Diagnosis: Aphonia (R49.1);Dysphagia, unspecified (R13.10)    Aspiration Risk  Moderate aspiration risk    Diet Recommendation NPO   Medication Administration: Via alternative means    Other   Recommendations Oral Care Recommendations: Oral care QID   Follow up Recommendations Other (comment)(TBD)      Frequency and Duration min 2x/week  2 weeks       Prognosis Prognosis for Safe Diet Advancement: Fair Barriers to Reach Goals: Behavior(anxiety)      Swallow Study   General Date of Onset: 09/26/17 HPI: 67 year old female with a past medical history significant for severe COPDcame to the emergency department on 09/26/2017 complaining of shortness of breath. Intubated on arrival. Two failed extubations: 1/8 and 1/11. Trach placed 1/15.  Type of Study: Bedside Swallow Evaluation Previous Swallow Assessment: none Diet Prior to this Study: NPO;NG Tube Temperature Spikes Noted: No Respiratory Status: Trach Collar;Trach Trach Size and Type: Cuff;#6;With PMSV in place History of Recent Intubation: Yes Length of Intubations (days): 15 days Date extubated: 10/10/17(trach placed) Behavior/Cognition: Alert;Cooperative(anxious) Oral Cavity Assessment: Within Functional Limits Oral Care Completed by SLP: Yes Oral Cavity - Dentition: Edentulous Vision: Functional for self-feeding Self-Feeding Abilities: Needs assist Patient Positioning: Upright in bed Baseline Vocal Quality: Hoarse;Low vocal intensity;Breathy Volitional Cough: Congested;Strong Volitional Swallow: Able to elicit    Oral/Motor/Sensory Function Overall Oral Motor/Sensory Function: Within functional limits   Ice Chips Ice chips: Impaired Presentation: Spoon Pharyngeal Phase Impairments: Suspected delayed Swallow;Decreased hyoid-laryngeal movement;Cough - Delayed;Change in Vital Signs   Thin Liquid Thin Liquid: Not tested    Nectar Thick Nectar Thick Liquid: Not tested   Honey Thick Honey Thick Liquid: Not tested   Puree Puree: Not tested   Solid    Solid: Not tested  Celia B. Murvin Natal, [facility], CCC-SLP Speech Language Pathologist [phone]  Leigh Aurora 10/16/2017,12:17 PM

## 2017-10-16 NOTE — Progress Notes (Signed)
[place] TEAM 1 - Stepdown/ICU TEAM  Sally [name]  MRN:[id number] DOB: 03/06/1951 DOA: 09/26/2017 PCP: Cabeza, Yuri M., MD    Brief Narrative:  67-year-old female with a history of severe COPDand 1PPD ongoing tobacco abuse who presented on September 26, 2017 w/ shortness of breath. She was initially treated with BiPAP but developed worsening respiratory failure and required intubation. Respiratory panel returned influenza A positive.  Significant Events: 1/1 admitted - intubated  1/15 tracheostomy  1/16 PTX during NG placement attempts > R chest tube   Subjective: The patient is much more alert and interactive today.  Her respirations are unlabored.  She is resting comfortably in bed.  She denies chest pain.  She does report some abdominal discomfort/dyspepsia.  Assessment & Plan:  Acute on chronic hypoxic respiratory failure  Trach collar only at this time - weaning to RA as able - trach/resp care per PCCM - appears to be making good progress   Influenza A Has completed a full tx course - asymptomatic at this time   COPD with acute exacerbation Care as per PCCM - appears to have stabilized - steroids being slowly weaned - no wheezing on exam today   Pneumothorax, Right Iatrogenic during NGT placement.  Chest tube removed 1/18 per PCCM - stable on exam - f/u CXR in AM 1/22  RLL infiltrate PCCM placed on Zosyn empirically - afebrile - WBC remains elevated, likely due to steroid - no other clinical signs of infection presently - Zosyn to be stopped today   Hypokalemia  Corrected   GERD  DVT prophylaxis: SQ heparin  Code Status: FULL CODE Family Communication: No family present at time of exam today Disposition Plan: transfer to SDU - PT/OT - SLP eval for swallow and PMV  Consultants:  PCCM  Antimicrobials:  Zosyn 1/16 > 1/21 Ceftriaxone 1/5 > 1/9 Tamiflu 1/2 > 1/6  Objective: Blood pressure (!) 149/60, pulse (!) 101, temperature 98.6 F (37 C), temperature  source Oral, resp. rate (!) 22, height 5' 1" (1.549 m), weight 73.9 kg (162 lb 14.7 oz), SpO2 100 %.  Intake/Output Summary (Last 24 hours) at 10/16/2017 0943 Last data filed at 10/16/2017 0600 Gross per 24 hour  Intake 1207 ml  Output 1600 ml  Net -393 ml   Filed Weights   10/14/17 0500 10/15/17 0401 10/16/17 0403  Weight: 73.2 kg (161 lb 6 oz) 74.5 kg (164 lb 3.9 oz) 73.9 kg (162 lb 14.7 oz)    Examination: General: No acute respiratory distress - alert and communicative  Lungs: CTA th/o - no wheezing   Cardiovascular: RRR - no gallup or rub  Abdomen: NT/ND, soft, BS+, overweight  Extremities: trace B LE edema w/o signif change   CBC: Recent Labs  Lab 10/10/17 0319 10/12/17 0456 10/14/17 0442  WBC 18.0* 16.6* 21.5*  HGB 9.7* 8.4* 9.7*  HCT 29.9* 25.4* 29.4*  MCV 91.2 93.0 92.7  PLT 208 211 283   Basic Metabolic Panel: Recent Labs  Lab 10/10/17 0319 10/11/17 0220 10/12/17 0456 10/13/17 0500 10/14/17 0442 10/15/17 0258  NA 136 137 138 142 140 139  K 4.2 3.8 3.4* 3.3* 3.5 4.2  CL 98* 101 100* 105 106 108  CO2 28 24 28 28 23 20*  GLUCOSE 117* 160* 140* 128* 111* 116*  BUN 27* 17 17 17 20 20  CREATININE 0.50 0.50 0.51 0.52 0.47 0.47  CALCIUM 8.1* 8.0* 7.8* 8.0* 8.1* 8.2*  MG 2.0  --  1.9  --   --   --     PHOS 4.1  --  3.5  --   --   --    GFR: Estimated Creatinine Clearance: 64.4 mL/min (by C-G formula based on SCr of 0.47 mg/dL).  Liver Function Tests: Recent Labs  Lab 10/14/17 0442  AST 36  ALT 118*  ALKPHOS 95  BILITOT 0.7  PROT 5.5*  ALBUMIN 2.4*    Coagulation Profile: Recent Labs  Lab 10/09/17 1116  INR 1.01   CBG: Recent Labs  Lab 10/15/17 1613 10/15/17 2009 10/15/17 2352 10/16/17 0354 10/16/17 0747  GLUCAP 140* 102* 101* 111* 107*     Scheduled Meds: . arformoterol  15 mcg Nebulization BID  . aspirin  81 mg Per Tube Daily  . budesonide (PULMICORT) nebulizer solution  0.5 mg Nebulization BID  . chlorhexidine gluconate (MEDLINE  KIT)  15 mL Mouth Rinse BID  . Chlorhexidine Gluconate Cloth  6 each Topical Daily  . diltiazem  30 mg Per Tube Q8H  . famotidine  20 mg Per Tube BID  . free water  100 mL Per Tube Q8H  . heparin  5,000 Units Subcutaneous Q8H  . insulin aspart  0-15 Units Subcutaneous Q4H  . insulin detemir  6 Units Subcutaneous Daily  . mouth rinse  15 mL Mouth Rinse QID  . nicotine  21 mg Transdermal Daily  . predniSONE  30 mg Oral Q breakfast   Followed by  . [START ON 10/21/2017] predniSONE  20 mg Oral Q breakfast   Followed by  . [START ON 10/26/2017] predniSONE  10 mg Oral Q breakfast   Followed by  . [START ON 10/31/2017] predniSONE  5 mg Oral Q breakfast     LOS: 20 days   [name], MD [facility] Office  [phone] Pager - Text Page per Amion as per below:  On-Call/Text Page:      [email or web address]      password [email or web address]  If 7PM-7AM, please contact night-coverage [email or web address] Password [facility] 10/16/2017, 9:43 AM

## 2017-10-16 NOTE — Progress Notes (Signed)
PULMONARY / CRITICAL CARE MEDICINE   Name: Sally [name] MRN: [id number] DOB: October 11, 1950    ADMISSION DATE:  09/26/2017 CONSULTATION DATE:  1/1  REFERRING MD:  Juleen China (EDP)   CHIEF COMPLAINT:  Dyspnea   HISTORY OF PRESENT ILLNESS:   67 year old female smoker with hx severe COPD presented 1/1 with dyspnea. Failed bipap and was intubated in ER.  Flu A POS.  Prolonged course, difficulty weaning from vent.  Now s/p trach 1/15.    SUBJECTIVE:  No change over weekend.  Tol ATC 24/7. Vent out of room.   No c/o.  Wants to drink.   VITAL SIGNS: BP (!) 149/60   Pulse (!) 101   Temp 98.6 F (37 C) (Oral)   Resp (!) 22   Ht 5\' 1"  (1.549 m)   Wt 73.9 kg (162 lb 14.7 oz)   SpO2 100%   BMI 30.78 kg/m   HEMODYNAMICS:    VENTILATOR SETTINGS: FiO2 (%):  [28 %] 28 %  INTAKE / OUTPUT: I/O last 3 completed shifts: In: 2297 [I.V.:260; NG/GT:1900; IV Piggyback:137] Out: 2600 [Urine:1650; Stool:950]  PHYSICAL EXAMINATION: General:  Chronically ill appearing female, NAD on ATC  Neuro:  Awake, alert, seems appropriate, follows commands  HEENT:  Mm moist, trach c/d  Cardiovascular:  s1s2 rrr Lungs:  resps even non labored on ATC, few scattered rhonchi, no audible wheeze   Abdomen:  Soft, non tender, +bs  Musculoskeletal:  Warm and dry, no sig edema   LABS:  BMET Recent Labs  Lab 10/13/17 0500 10/14/17 0442 10/15/17 0258  NA 142 140 139  K 3.3* 3.5 4.2  CL 105 106 108  CO2 28 23 20*  BUN 17 20 20   CREATININE 0.52 0.47 0.47  GLUCOSE 128* 111* 116*    Electrolytes Recent Labs  Lab 10/10/17 0319  10/12/17 0456 10/13/17 0500 10/14/17 0442 10/15/17 0258  CALCIUM 8.1*   < > 7.8* 8.0* 8.1* 8.2*  MG 2.0  --  1.9  --   --   --   PHOS 4.1  --  3.5  --   --   --    < > = values in this interval not displayed.    CBC Recent Labs  Lab 10/10/17 0319 10/12/17 0456 10/14/17 0442  WBC 18.0* 16.6* 21.5*  HGB 9.7* 8.4* 9.7*  HCT 29.9* 25.4* 29.4*  PLT 208 211 283     Coag's Recent Labs  Lab 10/09/17 1116  INR 1.01    Sepsis Markers Recent Labs  Lab 10/12/17 1034 10/13/17 0500  PROCALCITON 0.24 0.18    ABG Recent Labs  Lab 10/10/17 0310 10/11/17 0449 10/12/17 0508  PHART 7.522* 7.482* 7.469*  PCO2ART 35.6 38.7 41  PO2ART 102 150* 111*    Liver Enzymes Recent Labs  Lab 10/14/17 0442  AST 36  ALT 118*  ALKPHOS 95  BILITOT 0.7  ALBUMIN 2.4*    Cardiac Enzymes No results for input(s): TROPONINI, PROBNP in the last 168 hours.  Glucose Recent Labs  Lab 10/15/17 1223 10/15/17 1613 10/15/17 2009 10/15/17 2352 10/16/17 0354 10/16/17 0747  GLUCAP 142* 140* 102* 101* 111* 107*    Imaging No results found.   STUDIES:    CULTURES: 1/1 RVP>>> POS flu A  1/1 sputum >>> rare coag neg staph   ANTIBIOTICS:   SIGNIFICANT EVENTS:   LINES/TUBES: ETT 1/1>>>1/15 Trach 1/15>>> Chest tube >>> out 1/18  DISCUSSION: 67yo female with respiratory failure r/t AECOPD, flu A   ASSESSMENT / PLAN:  Acute respiratory failure  AECOPD Iatrogenic ptx - resolved, chest tube out  Flu A  Tobacco abuse  P:   Continue ATC as tolerated  Pulmonary hygiene  Intermittent f/u CXR  Continue prednisone with slow taper - in place  BD's  Will d/c zosyn - RLL infiltrate improved, afebrile, wbc stable on steroids  S/p tamiflu  Speech to see for swallow and PMV   TRH primary  PCCM will f/u twice weekly for trach needs. Please call sooner if needed.   FAMILY  - Updates: no family at bedside 1/21   Dirk Dress, NP 10/16/2017  9:45 AM Pager: [phone] or [phone]

## 2017-10-16 NOTE — Care Management Note (Signed)
Case Management Note  Patient Details  Name: Sally [name] MRN: [id number] Date of Birth: 05-Apr-1951  Subjective/Objective:                   Pt admitted with respiratory failure secondary to the flu  Action/Plan:   PTA independent from home with husband.  Pt had to be re-intubated 10/03/17.  CM will continue to follow for discharge needs   Expected Discharge Date:                  Expected Discharge Plan:  (From home with husband)  In-House Referral:     Discharge planning Services  CM Consult  Post Acute Care Choice:    Choice offered to:     DME Arranged:    DME Agency:     HH Arranged:    HH Agency:     Status of Service:     If discussed at [employer], dates discussed:    Additional Comments: 10/16/2017  Pt has remained on TC - Attending contacted and requested for discharge consideration.  CSW following for SNF placement  10/13/17 Pt is now s/p trach and has successfully been on TC for greater than 24 hours.  CSW following for probable SNF placement. PT eval ordered   10/10/17 Pt remains intubated - plan is for trach 1/15.  CSW reaching out to pts family to discuss the possibility of out of state placement if pt is unable to liberate from vent.    Cherylann Parr, RN 10/16/2017, 11:52 AM

## 2017-10-16 NOTE — NC FL2 (Signed)
[place] LEVEL OF CARE SCREENING TOOL     IDENTIFICATION  Patient Name: Sally [name] Birthdate: 1951-08-27 Sex: female Admission Date (Current Location): 09/26/2017  [facility] and [place] Number:  Herbalist and Address:  [facility] [place], [place] [address] [place], [place] [address]      Provider Number: [id number]  Attending Physician Name and Address:  [name], MD  Relative Name and Phone Number:       Current Level of Care: Hospital Recommended Level of Care: [place] Prior Approval Number:    Date Approved/Denied:   PASRR Number:   [id number]   Discharge Plan: SNF    Current Diagnoses: Patient Active Problem List   Diagnosis Date Noted  . Pneumothorax, traumatic   . Acute respiratory failure ([place])   . Influenza A   . Acute on chronic respiratory failure ([place])   . Acute respiratory failure with hypoxemia ([place]) 09/26/2017  . RSV (acute bronchiolitis due to respiratory syncytial virus) 10/06/2016  . COPD exacerbation ([place]) 07/14/2016  . Acute respiratory failure with hypoxia and hypercapnia (HCC)   . Essential hypertension   . Dyslipidemia     Orientation RESPIRATION BLADDER Height & Weight     Self, Time, Situation, Place  Tracheostomy(6 mm Shiley cuffed 28 percent. ) Incontinent Weight: 162 lb 14.7 oz (73.9 kg) Height:  5' 1"  (154.9 cm)  BEHAVIORAL SYMPTOMS/MOOD NEUROLOGICAL BOWEL NUTRITION STATUS      Incontinent Diet(please see discharge summary. )  AMBULATORY STATUS COMMUNICATION OF NEEDS Skin   Extensive Assist   Normal                       Personal Care Assistance Level of Assistance  Bathing, Feeding, Dressing Bathing Assistance: Maximum assistance Feeding assistance: Limited assistance Dressing Assistance: Maximum assistance     Functional Limitations Info  Speech, Sight, Hearing Sight Info: Adequate Hearing Info: Adequate Speech Info: Impaired(pt has trach )     SPECIAL CARE FACTORS FREQUENCY  PT (By licensed PT), OT (By licensed OT), Speech therapy     PT Frequency: 5 times a week  OT Frequency: 5 times a week      Speech Therapy Frequency: 5 times a week       Contractures Contractures Info: Not present    Additional Factors Info  Code Status, Allergies Code Status Info: Full Code Allergies Info: NKA           Current Medications (10/16/2017):  This is the current hospital active medication list Current Facility-Administered Medications  Medication Dose Route Frequency Provider Last Rate Last Dose  . acetaminophen (TYLENOL) solution 650 mg  650 mg Per Tube Q6H PRN [name], MD   650 mg at 10/15/17 1446  . albuterol (PROVENTIL) (2.5 MG/3ML) 0.083% nebulizer solution 2.5 mg  2.5 mg Nebulization Q2H PRN Simonne Maffucci B, MD   2.5 mg at 10/14/17 1406  . arformoterol (BROVANA) nebulizer solution 15 mcg  15 mcg Nebulization BID Kathi Ludwig, MD   15 mcg at 10/16/17 0744  . aspirin chewable tablet 81 mg  81 mg Per Tube Daily [name], MD   81 mg at 10/16/17 5035  . budesonide (PULMICORT) nebulizer solution 0.5 mg  0.5 mg Nebulization BID Kathi Ludwig, MD   0.5 mg at 10/16/17 0747  . chlorhexidine gluconate (MEDLINE KIT) (PERIDEX) 0.12 % solution 15 mL  15 mL Mouth Rinse BID Juanito Doom, MD  15 mL at 10/16/17 0800  . Chlorhexidine Gluconate Cloth 2 % PADS 6 each  6 each Topical Daily Juanito Doom, MD   6 each at 10/15/17 0930  . diltiazem (CARDIZEM) 10 mg/ml oral suspension 30 mg  30 mg Per Tube Q8H [name], MD   30 mg at 10/16/17 0540  . famotidine (PEPCID) 40 MG/5ML suspension 20 mg  20 mg Per Tube BID Allie Bossier, MD   20 mg at 10/16/17 0867  . feeding supplement (VITAL AF 1.2 CAL) liquid 1,000 mL  1,000 mL Per Tube Continuous Mannam, Praveen, MD 50 mL/hr at 10/16/17 0554 1,000 mL at 10/16/17 0554  . free water 100 mL  100 mL Per Tube Q8H Katherine Roan, MD   100 mL at  10/16/17 0500  . gi cocktail (Maalox,Lidocaine,Donnatal)  30 mL Per Tube TID PRN [name], MD      . heparin injection 5,000 Units  5,000 Units Subcutaneous Q8H Simonne Maffucci B, MD   5,000 Units at 10/16/17 0539  . hydrALAZINE (APRESOLINE) injection 10 mg  10 mg Intravenous Q6H PRN Katherine Roan, MD   10 mg at 10/06/17 2217  . insulin aspart (novoLOG) injection 0-15 Units  0-15 Units Subcutaneous Q4H Simonne Maffucci B, MD   2 Units at 10/15/17 1600  . LORazepam (ATIVAN) 2 MG/ML concentrated solution 0.5 mg  0.5 mg Per Tube Q4H PRN [name], MD   0.5 mg at 10/16/17 0537  . MEDLINE mouth rinse  15 mL Mouth Rinse QID Simonne Maffucci B, MD   15 mL at 10/16/17 0402  . nicotine (NICODERM CQ - dosed in mg/24 hours) patch 21 mg  21 mg Transdermal Daily Rigoberto Noel, MD   21 mg at 10/16/17 0927  . ondansetron (ZOFRAN) injection 4 mg  4 mg Intravenous Q6H PRN Juanito Doom, MD   4 mg at 10/11/17 0910  . predniSONE (DELTASONE) tablet 30 mg  30 mg Oral Q breakfast [name], MD   30 mg at 10/16/17 0800   Followed by  . [START ON 10/21/2017] predniSONE (DELTASONE) tablet 20 mg  20 mg Oral Q breakfast [name], MD       Followed by  . [START ON 10/26/2017] predniSONE (DELTASONE) tablet 10 mg  10 mg Oral Q breakfast [name], MD       Followed by  . [START ON 10/31/2017] predniSONE (DELTASONE) tablet 5 mg  5 mg Oral Q breakfast [name], MD      . sennosides (SENOKOT) 8.8 MG/5ML syrup 5 mL  5 mL Per Tube QHS PRN Katherine Roan, MD      . sodium chloride flush (NS) 0.9 % injection 10-40 mL  10-40 mL Intracatheter PRN Juanito Doom, MD         Discharge Medications: Please see discharge summary for a list of discharge medications.  Relevant Imaging Results:  Relevant Lab Results:   Additional Information SSN- [id number]  Wetzel Bjornstad, LCSWA

## 2017-10-17 ENCOUNTER — Inpatient Hospital Stay (HOSPITAL_COMMUNITY): Payer: Medicare Other

## 2017-10-17 LAB — COMPREHENSIVE METABOLIC PANEL
ALK PHOS: 96 U/L (ref 38–126)
ALT: 112 U/L — AB (ref 14–54)
ANION GAP: 13 (ref 5–15)
AST: 38 U/L (ref 15–41)
Albumin: 2.7 g/dL — ABNORMAL LOW (ref 3.5–5.0)
BILIRUBIN TOTAL: 0.7 mg/dL (ref 0.3–1.2)
BUN: 25 mg/dL — ABNORMAL HIGH (ref 6–20)
CO2: 21 mmol/L — AB (ref 22–32)
CREATININE: 0.44 mg/dL (ref 0.44–1.00)
Calcium: 8.5 mg/dL — ABNORMAL LOW (ref 8.9–10.3)
Chloride: 106 mmol/L (ref 101–111)
GFR calc non Af Amer: >60 mL/min (ref >60)
GLUCOSE: 110 mg/dL — AB (ref 65–99)
Potassium: 3.3 mmol/L — ABNORMAL LOW (ref 3.5–5.1)
Sodium: 140 mmol/L (ref 135–145)
TOTAL PROTEIN: 5.8 g/dL — AB (ref 6.5–8.1)

## 2017-10-17 LAB — GLUCOSE, CAPILLARY
GLUCOSE-CAPILLARY: 122 mg/dL — AB (ref 65–99)
GLUCOSE-CAPILLARY: 97 mg/dL (ref 65–99)
Glucose-Capillary: 106 mg/dL — ABNORMAL HIGH (ref 65–99)
Glucose-Capillary: 100 mg/dL — ABNORMAL HIGH (ref 65–99)
Glucose-Capillary: 128 mg/dL — ABNORMAL HIGH (ref 65–99)
Glucose-Capillary: 110 mg/dL — ABNORMAL HIGH (ref 65–99)

## 2017-10-17 LAB — CBC
HCT: 32.8 % — ABNORMAL LOW (ref 36.0–46.0)
HEMOGLOBIN: 10.7 g/dL — AB (ref 12.0–15.0)
MCH: 30.5 pg (ref 26.0–34.0)
MCHC: 32.6 g/dL (ref 30.0–36.0)
MCV: 93.4 fL (ref 78.0–100.0)
Platelets: 335 10*3/uL (ref 150–400)
RBC: 3.51 MIL/uL — AB (ref 3.87–5.11)
RDW: 15.4 % (ref 11.5–15.5)
WBC: 22.2 10*3/uL — ABNORMAL HIGH (ref 4.0–10.5)

## 2017-10-17 MED ORDER — POTASSIUM CHLORIDE 20 MEQ/15ML (10%) PO SOLN
40.0000 meq | Freq: Once | ORAL | Status: AC
  Administered 2017-10-17: 40 meq
  Filled 2017-10-17: qty 30

## 2017-10-17 NOTE — Progress Notes (Signed)
  Speech Language Pathology Treatment: Sally [name] Speaking valve  Patient Details Name: Sally [name] MRN: [id number] DOB: [birth year] Today's Date: 10/17/2017 Time: 7829-5621 SLP Time Calculation (min) (ACUTE ONLY): 29 min  Assessment / Plan / Recommendation Clinical Impression  Pt continues to have limited tolerance of PMV, with max trial time being 1 min. Pt reports increased anxiety during trials. Pt had reduced cough/secretions, only requiring oral suctioning X1 with minimal secretions. Pt had mild back pressure upon PMV removal and limited ability to blow air from her mouth with finger occlusion. Limited trial time was likely a result of reduced upper airway patency and increased anxiety. Pt would possibly benefit from downgrading trach size/cuff to assist with airway patency. RN is planning to administer anxiety medication tomorrow before SLP arrival to mediate anxiety during session.    HPI HPI: 67 year old female with a past medical history significant for severe COPDcame to the emergency department on 09/26/2017 complaining of shortness of breath. Intubated on arrival. Two failed extubations: 1/8 and 1/11. Trach placed 1/15.       SLP Plan  Continue with current plan of care       Recommendations         Patient may use Passy-Muir Speech Valve: with SLP only PMSV Supervision: Full MD: Please consider changing trach tube to : Smaller size;Cuffless         Plan: Continue with current plan of care       GO              [place] Josemaria Brining SLP Student Clinician   [place] Therisa Mennella 10/17/2017, 12:58 PM

## 2017-10-17 NOTE — Progress Notes (Signed)
Physical Therapy Treatment Patient Details Name: Sally [name] MRN: [id number] DOB: 1951/01/14 Today's Date: 10/17/2017    History of Present Illness Pt is a 66 y/o female admitted secondary to a COPD exacerbation and +Flu. Pt required intubation    PT Comments    Pt supine in bed upon arrival. Awake, alert, and agreeable to participate in therapy. Husband present in the room. Pt tolerated sitting EOB x 5 minutes with min to mod assist. Cues to activate abdominal muscle to stabilize trunk in sitting. Attempted sit to stand without success. Pt shaking her head no during attempt. When asked if she is feeling too weak to stand, she answered yes. Pt participated in LE seated exercises, and additional LE exercises after return to supine.    Follow Up Recommendations  SNF;Supervision/Assistance - 24 hour     Equipment Recommendations  None recommended by PT    Recommendations for Other Services       Precautions / Restrictions Precautions Precautions: Fall;Other (comment) Precaution Comments: trach collar, rectal tube, NG tube    Mobility  Bed Mobility   Bed Mobility: Supine to Sit     Supine to sit: +2 for physical assistance;Mod assist Sit to supine: +2 for physical assistance;Max assist   General bed mobility comments: increased time an effort  Transfers                    Ambulation/Gait                 Stairs            Wheelchair Mobility    Modified Rankin (Stroke Patients Only)       Balance   Sitting-balance support: Feet unsupported;Bilateral upper extremity supported Sitting balance-Leahy Scale: Poor Sitting balance - Comments: min to mod asssit to maintain sitting balance x 5 minutes Postural control: Posterior lean                                  Cognition Arousal/Alertness: Awake/alert Behavior During Therapy: WFL for tasks assessed/performed Overall Cognitive Status: Difficult to assess                                  General Comments: Able to follow simple commands throughout session.      Exercises General Exercises - Lower Extremity Long Arc Quad: AROM;Right;Left;5 reps;Seated Heel Slides: AAROM;Right;Left;5 reps;Supine Hip ABduction/ADduction: AAROM;Right;Left;5 reps;Supine    General Comments        Pertinent Vitals/Pain Pain Assessment: Faces Faces Pain Scale: Hurts little more Pain Location: generalized with mobility Pain Descriptors / Indicators: Grimacing;Guarding;Sore Pain Intervention(s): Monitored during session;Repositioned    Home Living                      Prior Function            PT Goals (current goals can now be found in the care plan section) Acute Rehab PT Goals Patient Stated Goal: home, per husband Time For Goal Achievement: 10/28/17 Potential to Achieve Goals: Fair Progress towards PT goals: Progressing toward goals    Frequency    Min 2X/week      PT Plan Current plan remains appropriate    Co-evaluation              AM-PAC PT "6 Clicks" Daily Activity  Outcome Measure  Difficulty turning over in bed (including adjusting bedclothes, sheets and blankets)?: Unable Difficulty moving from lying on back to sitting on the side of the bed? : Unable Difficulty sitting down on and standing up from a chair with arms (e.g., wheelchair, bedside commode, etc,.)?: Unable Help needed moving to and from a bed to chair (including a wheelchair)?: Total Help needed walking in hospital room?: Total Help needed climbing 3-5 steps with a railing? : Total 6 Click Score: 6    End of Session Equipment Utilized During Treatment: Gait belt;Oxygen Activity Tolerance: Patient tolerated treatment well Patient left: in bed;with call bell/phone within reach;with family/visitor present;with SCD's reapplied Nurse Communication: Mobility status;Need for lift equipment PT Visit Diagnosis: Other abnormalities of gait and mobility  (R26.89);Muscle weakness (generalized) (M62.81)     Time: 1610-9604 PT Time Calculation (min) (ACUTE ONLY): 29 min  Charges:  $Therapeutic Exercise: 8-22 mins $Therapeutic Activity: 8-22 mins                    G Codes:       Aida Raider, PT  Office # [phone] Pager [phone]    Ilda Foil 10/17/2017, 9:14 AM

## 2017-10-17 NOTE — Progress Notes (Signed)
CSW spoke with pt and husband at bedside. During this time pt's husband sought further information on resources that are available to pt and family. CSW informed pt's husband that CSW would provide them with resources in the community for further assistance with utilities. CSW presented bed offers to family which are [employer] and [employer]. At 10:48am CSW received another call from [place] at [facility] informing CSW that they are unable to take pt due to the suctioning at this time. CSW will update family at bedside and provided them with resources.    Claude Manges Kerstyn Coryell, MSW, LCSW-A Emergency Department Clinical Social Worker [phone]

## 2017-10-17 NOTE — Progress Notes (Signed)
[name] - Stepdown/ICU TEAM  Sally [name]  YBW:[id number] DOB: [birth year] DOA: 09/26/2017 PCP: [name], MD    Brief Narrative:  67 year old female with a history of severe COPDand 1PPD ongoing tobacco abuse who presented on September 26, 2017 w/ shortness of breath. She was initially treated with BiPAP but developed worsening respiratory failure and required intubation. Respiratory panel returned influenza A positive.  Significant Events: 1/1 admitted - intubated  1/15 tracheostomy  1/16 PTX during NG placement attempts > R chest tube   Subjective: The patient looks very comfortable on minimal trach collar support.  She is alert oriented and conversant.  She denies chest pain nausea vomiting or abdominal pain.  She is very hungry.  Assessment & Plan:  Acute on chronic hypoxic respiratory failure  Trach collar only at this time - weaning to RA as able - trach/resp care per PCCM - appears to be making great progress   Influenza A Has completed a full tx course - asymptomatic at this time   Dysphagia  Felt to be due to weakness of acute illness + presence of trach - SLP following - clinically pt appears to be very close to point of tolerating oral intake - awaiting f/u SLP eval   COPD with acute exacerbation Care as per PCCM - appears to have stabilized - steroids being slowly weaned - no wheezing today  Pneumothorax, Right Iatrogenic during NGT placement.  Chest tube removed 1/18 per PCCM - stable on exam - f/u CXR today is stable   RLL infiltrate Was tx w/ 5 days of empiric Zosyn - afebrile - elevated WBC due to steroid  Hypokalemia  Recurring - cont to supplement and follow   GERD  DVT prophylaxis: SQ heparin  Code Status: FULL CODE Family Communication: Spoke with husband in hallway Disposition Plan: transfer to SDU - PT/OT - SLP eval for swallow and PMV  Consultants:  PCCM  Antimicrobials:  Zosyn 1/16 > 1/21 Ceftriaxone 1/5 > 1/9 Tamiflu 1/2 >  1/6  Objective: Blood pressure (!) 146/78, pulse 100, temperature 97.6 F (36.4 C), temperature source Oral, resp. rate (!) 26, height _0  (1.549 m), weight 74.9 kg (165 lb 2 oz), SpO2 95 %.  Intake/Output Summary (Last 24 hours) at 10/17/2017 0916 Last data filed at 10/17/2017 0900 Gross per 24 hour  Intake 810 ml  Output 800 ml  Net 10 ml   Filed Weights   10/15/17 0401 10/16/17 0403 10/17/17 0500  Weight: 74.5 kg (164 lb 3.9 oz) 73.9 kg (162 lb 14.7 oz) 74.9 kg (165 lb 2 oz)    Examination: General: No acute respiratory distress - alert and pleasant   Lungs: good air movement in all fields - no wheezing  Cardiovascular: RRR w/o M or rub  Abdomen: NT/ND, soft, BS+, overweight  Extremities: no signif B LE edema   CBC: Recent Labs  Lab 10/12/17 0456 10/14/17 0442 10/17/17 0312  WBC 16.6* 21.5* 22.2*  HGB 8.4* 9.7* 10.7*  HCT 25.4* 29.4* 32.8*  MCV 93.0 92.7 93.4  PLT 211 283 768   Basic Metabolic Panel: Recent Labs  Lab 10/12/17 0456 10/13/17 0500 10/14/17 0442 10/15/17 0258 10/17/17 0312  NA 138 142 140 139 140  K 3.4* 3.3* 3.5 4.2 3.3*  CL 100* 105 106 108 106  CO2 _1 20* 21*  GLUCOSE 140* 128* 111* 116* 110*  BUN _2 25*  CREATININE 0.51 0.52 0.47 0.47 0.44  CALCIUM 7.8* 8.0*  8.1* 8.2* 8.5*  MG 1.9  --   --   --   --   PHOS 3.5  --   --   --   --    GFR: Estimated Creatinine Clearance: 64.9 mL/min (by C-G formula based on SCr of 0.44 mg/dL).  Liver Function Tests: Recent Labs  Lab 10/14/17 0442 10/17/17 0312  AST 36 38  ALT 118* 112*  ALKPHOS 95 96  BILITOT 0.7 0.7  PROT 5.5* 5.8*  ALBUMIN 2.4* 2.7*   CBG: Recent Labs  Lab 10/16/17 1539 10/16/17 2001 10/16/17 2350 10/17/17 0345 10/17/17 0749  GLUCAP 159* 98 113* 106* 100*     Scheduled Meds: . arformoterol  15 mcg Nebulization BID  . aspirin  81 mg Per Tube Daily  . budesonide (PULMICORT) nebulizer solution  0.5 mg Nebulization BID  . chlorhexidine gluconate  (MEDLINE KIT)  15 mL Mouth Rinse BID  . Chlorhexidine Gluconate Cloth  6 each Topical Daily  . diltiazem  30 mg Per Tube Q8H  . famotidine  20 mg Per Tube BID  . free water  100 mL Per Tube Q8H  . heparin  5,000 Units Subcutaneous Q8H  . insulin aspart  0-15 Units Subcutaneous Q4H  . mouth rinse  15 mL Mouth Rinse QID  . nicotine  21 mg Transdermal Daily  . potassium chloride  40 mEq Per Tube Once  . predniSONE  30 mg Oral Q breakfast   Followed by  . [START ON 10/21/2017] predniSONE  20 mg Oral Q breakfast   Followed by  . [START ON 10/26/2017] predniSONE  10 mg Oral Q breakfast   Followed by  . [START ON 10/31/2017] predniSONE  5 mg Oral Q breakfast     LOS: 21 days   [name], MD [facility] Office  [phone] Pager - Text Page per Amion as per below:  On-Call/Text Page:      [email or web address]      password [email or web address]  If 7PM-7AM, please contact night-coverage [email or web address] Password [facility] 10/17/2017, 9:16 AM

## 2017-10-17 NOTE — Progress Notes (Signed)
Sutures removed per MD order from trach site.  Noted a small red place where one of the sutures was placed.  Split gauze was placed underneath trach.  Patient tolerated well.

## 2017-10-18 DIAGNOSIS — R1310 Dysphagia, unspecified: Secondary | ICD-10-CM

## 2017-10-18 LAB — BASIC METABOLIC PANEL
Anion gap: 11 (ref 5–15)
BUN: 22 mg/dL — AB (ref 6–20)
CALCIUM: 8.4 mg/dL — AB (ref 8.9–10.3)
CO2: 24 mmol/L (ref 22–32)
Chloride: 102 mmol/L (ref 101–111)
Creatinine, Ser: 0.45 mg/dL (ref 0.44–1.00)
GFR calc Af Amer: >60 mL/min (ref >60)
Glucose, Bld: 115 mg/dL — ABNORMAL HIGH (ref 65–99)
POTASSIUM: 4 mmol/L (ref 3.5–5.1)
Sodium: 137 mmol/L (ref 135–145)

## 2017-10-18 LAB — GLUCOSE, CAPILLARY
GLUCOSE-CAPILLARY: 103 mg/dL — AB (ref 65–99)
Glucose-Capillary: 114 mg/dL — ABNORMAL HIGH (ref 65–99)
Glucose-Capillary: 125 mg/dL — ABNORMAL HIGH (ref 65–99)
Glucose-Capillary: 125 mg/dL — ABNORMAL HIGH (ref 65–99)

## 2017-10-18 LAB — MAGNESIUM: MAGNESIUM: 2.1 mg/dL (ref 1.7–2.4)

## 2017-10-18 NOTE — Progress Notes (Signed)
[facility] PROGRESS NOTE  Sally [name] BHA:[id number] DOB: 05/31/1951 DOA: 09/26/2017  PCP: [name], MD  Brief History/Interval Summary: 67 year old female with a history of severe COPD tobacco abuse presented with shortness of breath.  Had to be intubated for respiratory failure.  Subsequently underwent tracheostomy.  Also developed pneumothorax requiring chest tube.  Subsequently transferred to stepdown unit.  Reason for Visit: Acute on chronic hypoxic respiratory failure  Consultants: Pulmonology  Procedures: Tracheostomy.  Chest tube placement for pneumothorax.  Antibiotics: Zosyn 1/16 > 1/21 Ceftriaxone 1/5 > 1/9 Tamiflu 1/2 > 1/6   Subjective/Interval History: Patient denies any complaints.  Difficult to communicate due to tracheostomy.  Her husband is at the bedside.  ROS: Denies any chest pain.  Objective:  Vital Signs  Vitals:   10/18/17 0329 10/18/17 0450 10/18/17 0726 10/18/17 1136  BP: (!) 136/53  (!) 123/49 (!) 152/65  Pulse: 91  86 94  Resp: (!) 21  19 18   Temp: 98.2 F (36.8 C)  97.6 F (36.4 C) 98.1 F (36.7 C)  TempSrc: Oral  Oral Oral  SpO2: 98%  97% 97%  Weight:  71.3 kg (157 lb 3 oz)    Height:        Intake/Output Summary (Last 24 hours) at 10/18/2017 1311 Last data filed at 10/18/2017 0400 Gross per 24 hour  Intake 750 ml  Output -  Net 750 ml   Filed Weights   10/16/17 0403 10/17/17 0500 10/18/17 0450  Weight: 73.9 kg (162 lb 14.7 oz) 74.9 kg (165 lb 2 oz) 71.3 kg (157 lb 3 oz)    General appearance: alert, cooperative, appears stated age and no distress Head: Normocephalic, without obvious abnormality, atraumatic, Tracheostomy noted Resp: Diminished air entry at the bases with a few crackles.  No wheezing.  No rhonchi. Cardio: regular rate and rhythm, S1, S2 normal, no murmur, click, rub or gallop GI: soft, non-tender; bowel sounds normal; no masses,  no organomegaly Extremities: extremities normal, atraumatic, no  cyanosis or edema Neurologic: No obvious focal neurological deficits.  Lab Results:  Data Reviewed: I have personally reviewed following labs and imaging studies  CBC: Recent Labs  Lab 10/12/17 0456 10/14/17 0442 10/17/17 0312  WBC 16.6* 21.5* 22.2*  HGB 8.4* 9.7* 10.7*  HCT 25.4* 29.4* 32.8*  MCV 93.0 92.7 93.4  PLT 211 283 973    Basic Metabolic Panel: Recent Labs  Lab 10/12/17 0456 10/13/17 0500 10/14/17 0442 10/15/17 0258 10/17/17 0312 10/18/17 0710  NA 138 142 140 139 140 137  K 3.4* 3.3* 3.5 4.2 3.3* 4.0  CL 100* 105 106 108 106 102  CO2 28 28 23  20* 21* 24  GLUCOSE 140* 128* 111* 116* 110* 115*  BUN 17 17 20 20  25* 22*  CREATININE 0.51 0.52 0.47 0.47 0.44 0.45  CALCIUM 7.8* 8.0* 8.1* 8.2* 8.5* 8.4*  MG 1.9  --   --   --   --  2.1  PHOS 3.5  --   --   --   --   --     GFR: Estimated Creatinine Clearance: 63.3 mL/min (by C-G formula based on SCr of 0.45 mg/dL).  Liver Function Tests: Recent Labs  Lab 10/14/17 0442 10/17/17 0312  AST 36 38  ALT 118* 112*  ALKPHOS 95 96  BILITOT 0.7 0.7  PROT 5.5* 5.8*  ALBUMIN 2.4* 2.7*    CBG: Recent Labs  Lab 10/17/17 1958 10/17/17 2311 10/18/17 0450 10/18/17 0809 10/18/17 1147  GLUCAP 110* 97  103* 114* 125*     Radiology Studies: Dg Chest Portable 1 View  Result Date: 10/17/2017 CLINICAL DATA:  Respiratory failure and shortness of breath EXAM: PORTABLE CHEST 1 VIEW COMPARISON:  Chest radiograph 10/13/2017 FINDINGS: Tracheostomy tube tip is at the level of the clavicles. Nasogastric tube courses below the diaphragm. The lungs are clear. No pleural effusion or pneumothorax. Normal cardiomediastinal contours. IMPRESSION: No active disease. Electronically Signed   By: Ulyses Jarred M.D.   On: 10/17/2017 06:24     Medications:  Scheduled: . arformoterol  15 mcg Nebulization BID  . aspirin  81 mg Per Tube Daily  . budesonide (PULMICORT) nebulizer solution  0.5 mg Nebulization BID  . chlorhexidine  gluconate (MEDLINE KIT)  15 mL Mouth Rinse BID  . Chlorhexidine Gluconate Cloth  6 each Topical Daily  . diltiazem  30 mg Per Tube Q8H  . famotidine  20 mg Per Tube BID  . free water  100 mL Per Tube Q8H  . heparin  5,000 Units Subcutaneous Q8H  . insulin aspart  0-15 Units Subcutaneous Q4H  . mouth rinse  15 mL Mouth Rinse QID  . nicotine  21 mg Transdermal Daily  . predniSONE  30 mg Oral Q breakfast   Followed by  . [START ON 10/21/2017] predniSONE  20 mg Oral Q breakfast   Followed by  . [START ON 10/26/2017] predniSONE  10 mg Oral Q breakfast   Followed by  . [START ON 10/31/2017] predniSONE  5 mg Oral Q breakfast   Continuous: . feeding supplement (VITAL AF 1.2 CAL) 1,000 mL (10/18/17 0454)   KPT:WSFKCLEXNTZGY (TYLENOL) oral liquid 160 mg/5 mL, gi cocktail, hydrALAZINE, LORazepam, ondansetron (ZOFRAN) IV, sennosides, sodium chloride flush  Assessment/Plan:  Active Problems:   Acute respiratory failure with hypoxemia (HCC)   Acute on chronic respiratory failure (HCC)   Influenza A   Acute respiratory failure (HCC)   Pneumothorax, traumatic    Acute on chronic hypoxic respiratory failure Currently on trach collar.  Pulmonology and respiratory therapist are following.  Speech therapy is following as well.  Influenza A She has completed a course of treatment.  She is asymptomatic.  Dysphagia Felt to be due to generalized weakness from acute illness and tracheostomy.  Speech therapy is following.  Currently on tube feedings.  History of COPD with acute exacerbation Pulmonology is following.  She has stabilized.  Steroids are being slowly weaned.  Elevated WBC most likely due to steroids.  Pneumothorax, right-sided, iatrogenic during NG tube placement She had a chest tube placed by pulmonology.  Removed on 1/18.  Stable.  Right lower lobe infiltrate Treated with 5 days of empiric Zosyn.  Appears to be stable currently.  Hypokalemia Resolved.  History of  GERD Pepcid  DVT Prophylaxis: Subcutaneous heparin    Code Status: Full code Family Communication: Discussed with patient and her husband Disposition Plan: Management as outlined above.  Social worker is following.  Family prefers for her to come home however she may need placement for rehab before she can go home.    LOS: 22 days   [place] [facility] Pager [phone] 10/18/2017, 1:11 PM  If 7PM-7AM, please contact night-coverage at [email or web address], password [facility]

## 2017-10-18 NOTE — Care Management Note (Addendum)
Case Management Note  Patient Details  Name: Sally [name] MRN: [id number] Date of Birth: Dec 25, 1950  Subjective/Objective:   From home with spouse, who is able to be with patient 24 hrs a day, he does not work and he states his daughter will be able to assist also.  He would like to take patient home with [facility] services instead of her going to SNF.  NCM gave patient and spouse, [facility]  Agency list to look over.  NCM will check back with them tomorrow.     1/25 1533 Letha Cape RN, BSN- NCM left message with spouse to return call.  Per RN he had decided that he may let her go to SNF after seeing how much work was involved to go home.               Action/Plan: NCM will follow along with CSW for dc needs.   Expected Discharge Date:                  Expected Discharge Plan:  (From home with husband)  In-House Referral:     Discharge planning Services  CM Consult  Post Acute Care Choice:    Choice offered to:     DME Arranged:    DME Agency:     HH Arranged:    HH Agency:     Status of Service:  In process, will continue to follow  If discussed at Long Length of Stay Meetings, dates discussed:    Additional Comments:  Leone Haven, RN 10/18/2017, 4:09 PM

## 2017-10-18 NOTE — Progress Notes (Signed)
  Speech Language Pathology Treatment: Sally [name] Speaking valve  Patient Details Name: Sally [name] MRN: [id number] DOB: July 10, 1951 Today's Date: 10/18/2017 Time: 4315-4008 SLP Time Calculation (min) (ACUTE ONLY): 30 min  Assessment / Plan / Recommendation Clinical Impression  Pt tolerated extended PMV trial today, indicated by ability to wear PMV for 20 min with stable vitals and no back pressure with removal. Pt had increased work of breathing and agitation upon the 20 min mark leading to the removal of the PMV, increased anxiety with positioning discomfort is the likely cause for change in PMV tolerance. Pt confirmed that anxiety medicine helped her to tolerate PMV. Mod verbal cues to take calm, deep breaths and to attempt to use voice were used throughout session. Pt's husband educated on how to supervise vitals during PMV trials only, not trained on placing/removing valve yet. Recommend starting intermittent PMV trials, max 15-20 min, with full supervision to increase pt tolerance of PMV. Ice chip PO trials were limited secondary to pt's discomfort with sitting up in bed. No signs/symptoms of aspiration indicated during trials. Pt obtained weak, hoarse phonation, though slightly stronger than previous trials. Continued edema and irritation from intubation is likely hindering pt's ability to phonate and increases risk of silent aspiration. Recommend objective assessment of pt swallow function when pt medically appropriate and consistently able to tolerate PMV for at least 20 min.     HPI HPI: 67 year old female with a past medical history significant for severe COPDcame to the emergency department on 09/26/2017 complaining of shortness of breath. Intubated on arrival. Two failed extubations: 1/8 and 1/11. Trach placed 1/15.       SLP Plan  Continue with current plan of care       Recommendations  Diet recommendations: NPO      Patient may use Passy-Muir Speech Valve: Intermittently with  supervision PMSV Supervision: Full MD: Please consider changing trach tube to : Smaller size;Cuffless         Oral Care Recommendations: Oral care QID Follow up Recommendations: (tbd) SLP Visit Diagnosis: Aphonia (R49.1);Dysphagia, oropharyngeal phase (R13.12) Plan: Continue with current plan of care       GO              [place] Harjot Dibello SLP Student Clinician   [place] Julicia Krieger 10/18/2017, 1:11 PM

## 2017-10-18 NOTE — Progress Notes (Signed)
CSW spoke with pt and pt spouse concerning PT recommendation for SNF.  Patient and spouse are strongly against SNF if at all possible and spouse plans on taking patient home- states he is available 24/hours and understands home health will not provide much assistance.  CSW informed RNCM of preference for home health- please reconsult CSW if needed  Burna Sis, LCSW Clinical Social Worker [phone]

## 2017-10-19 LAB — GLUCOSE, CAPILLARY
GLUCOSE-CAPILLARY: 102 mg/dL — AB (ref 65–99)
GLUCOSE-CAPILLARY: 114 mg/dL — AB (ref 65–99)
GLUCOSE-CAPILLARY: 138 mg/dL — AB (ref 65–99)
Glucose-Capillary: 114 mg/dL — ABNORMAL HIGH (ref 65–99)
Glucose-Capillary: 119 mg/dL — ABNORMAL HIGH (ref 65–99)
Glucose-Capillary: 119 mg/dL — ABNORMAL HIGH (ref 65–99)
Glucose-Capillary: 149 mg/dL — ABNORMAL HIGH (ref 65–99)
Glucose-Capillary: 89 mg/dL (ref 65–99)

## 2017-10-19 MED ORDER — GUAIFENESIN 100 MG/5ML PO SOLN
5.0000 mL | ORAL | Status: DC | PRN
  Administered 2017-10-19: 100 mg
  Filled 2017-10-19: qty 5

## 2017-10-19 MED ORDER — MORPHINE SULFATE (PF) 4 MG/ML IV SOLN
1.0000 mg | Freq: Once | INTRAVENOUS | Status: AC
  Administered 2017-10-19: 1 mg via INTRAVENOUS
  Filled 2017-10-19: qty 1

## 2017-10-19 NOTE — Progress Notes (Signed)
  Speech Language Pathology Treatment: Sally [name] Speaking valve  Patient Details Name: Sally [name] MRN: [id number] DOB: 09-Mar-1951 Today's Date: 10/19/2017 Time: 4158-3094 SLP Time Calculation (min) (ACUTE ONLY): 30 min  Assessment / Plan / Recommendation Clinical Impression  Pt was able to tolerate PMV for 20 min again. Pt did not have back pressure upon removal and vitals were stable throughout trial. Pt continues to have anxiety from PMV, but agrees to wear it with hope of getting to eat and drink soon. Pt continues to be able clear and elicit swallow with ice chip trials without signs of aspiration. Weak cough and minimal voicing persists, leading to need to objectively assess swallow to ensure airway protection. Recommend continued trials of PMV (max 15-20 min) with full supervision. Daughter was educated on monitoring vitals during PMV trials. MBS will likely be scheduled tomorrow, with pt family and nursing aware.     HPI HPI: 67 year old female with a past medical history significant for severe COPDcame to the emergency department on 09/26/2017 complaining of shortness of breath. Intubated on arrival. Two failed extubations: 1/8 and 1/11. Trach placed 1/15.       SLP Plan  Continue with current plan of care       Recommendations         Patient may use Passy-Muir Speech Valve: Intermittently with supervision PMSV Supervision: Full MD: Please consider changing trach tube to : Smaller size;Cuffless         Oral Care Recommendations: Oral care QID Follow up Recommendations: Other (comment)(TBD) SLP Visit Diagnosis: Aphonia (R49.1);Dysphagia, oropharyngeal phase (R13.12) Plan: Continue with current plan of care       GO              Sally [name] SLP Student Clinician  Sally [name] 10/19/2017, 3:27 PM

## 2017-10-19 NOTE — Progress Notes (Signed)
[facility] PROGRESS NOTE  Libbie Bartley ERD:[id number] DOB: 04-Oct-1950 DOA: 09/26/2017  PCP: [name], MD  Brief History/Interval Summary: 67 year old female with a history of severe COPD tobacco abuse presented with shortness of breath.  Had to be intubated for respiratory failure.  Subsequently underwent tracheostomy.  Also developed pneumothorax requiring chest tube.  Subsequently transferred to stepdown unit.   Reason for Visit: Acute on chronic hypoxic respiratory failure  Consultants: Pulmonology  Procedures: Tracheostomy.  Chest tube placement for pneumothorax.  Antibiotics: Zosyn 1/16 > 1/21 Ceftriaxone 1/5 > 1/9 Tamiflu 1/2 > 1/6   Subjective/Interval History: Patient denies any complaints.  Breathing is better.  Difficult to communicate due to tracheostomy.  She is asking to speak with the pulmonologist.   ROS: Denies any chest pain  Objective:  Vital Signs  Vitals:   10/19/17 0600 10/19/17 0737 10/19/17 0809 10/19/17 0812  BP: (!) 126/55 (!) 132/58 (!) 145/65   Pulse: 84 89 92   Resp: (!) 23 (!) 25 (!) 21   Temp:  97.9 F (36.6 C)    TempSrc:  Oral    SpO2: 96% 97% 96% 96%  Weight:      Height:        Intake/Output Summary (Last 24 hours) at 10/19/2017 0927 Last data filed at 10/19/2017 0600 Gross per 24 hour  Intake 1400 ml  Output 450 ml  Net 950 ml   Filed Weights   10/17/17 0500 10/18/17 0450 10/19/17 0401  Weight: 74.9 kg (165 lb 2 oz) 71.3 kg (157 lb 3 oz) 71.2 kg (156 lb 15.5 oz)    General appearance: Awake alert.  In no distress.  NG tube noted. Resp: Diminished air entry at the bases with few crackles.  No wheezing or rhonchi.  Normal effort at rest. Cardio: S1-S2 is normal regular.  No S3-S4.  No rubs murmurs or bruit GI: Abdomen is soft.  Nontender nondistended.  Bowel sounds are present.  No masses organomegaly Extremities: No edema noted Neurologic: No obvious focal neurological deficits.  Lab Results:  Data  Reviewed: I have personally reviewed following labs and imaging studies  CBC: Recent Labs  Lab 10/14/17 0442 10/17/17 0312  WBC 21.5* 22.2*  HGB 9.7* 10.7*  HCT 29.4* 32.8*  MCV 92.7 93.4  PLT 283 563    Basic Metabolic Panel: Recent Labs  Lab 10/13/17 0500 10/14/17 0442 10/15/17 0258 10/17/17 0312 10/18/17 0710  NA 142 140 139 140 137  K 3.3* 3.5 4.2 3.3* 4.0  CL 105 106 108 106 102  CO2 28 23 20* 21* 24  GLUCOSE 128* 111* 116* 110* 115*  BUN 17 20 20  25* 22*  CREATININE 0.52 0.47 0.47 0.44 0.45  CALCIUM 8.0* 8.1* 8.2* 8.5* 8.4*  MG  --   --   --   --  2.1    GFR: Estimated Creatinine Clearance: 63.3 mL/min (by C-G formula based on SCr of 0.45 mg/dL).  Liver Function Tests: Recent Labs  Lab 10/14/17 0442 10/17/17 0312  AST 36 38  ALT 118* 112*  ALKPHOS 95 96  BILITOT 0.7 0.7  PROT 5.5* 5.8*  ALBUMIN 2.4* 2.7*    CBG: Recent Labs  Lab 10/18/17 1624 10/18/17 2034 10/18/17 2338 10/19/17 0355 10/19/17 0739  GLUCAP 125* 138* 102* 114* 114*     Radiology Studies: No results found.   Medications:  Scheduled: . arformoterol  15 mcg Nebulization BID  . aspirin  81 mg Per Tube Daily  . budesonide (PULMICORT) nebulizer  solution  0.5 mg Nebulization BID  . chlorhexidine gluconate (MEDLINE KIT)  15 mL Mouth Rinse BID  . Chlorhexidine Gluconate Cloth  6 each Topical Daily  . diltiazem  30 mg Per Tube Q8H  . famotidine  20 mg Per Tube BID  . free water  100 mL Per Tube Q8H  . heparin  5,000 Units Subcutaneous Q8H  . insulin aspart  0-15 Units Subcutaneous Q4H  . mouth rinse  15 mL Mouth Rinse QID  . nicotine  21 mg Transdermal Daily  . predniSONE  30 mg Oral Q breakfast   Followed by  . [START ON 10/21/2017] predniSONE  20 mg Oral Q breakfast   Followed by  . [START ON 10/26/2017] predniSONE  10 mg Oral Q breakfast   Followed by  . [START ON 10/31/2017] predniSONE  5 mg Oral Q breakfast   Continuous: . feeding supplement (VITAL AF 1.2 CAL) 1,000  mL (10/19/17 0223)   BUL:AGTXMIWOEHOZY (TYLENOL) oral liquid 160 mg/5 mL, gi cocktail, hydrALAZINE, LORazepam, ondansetron (ZOFRAN) IV, sennosides, sodium chloride flush  Assessment/Plan:  Active Problems:   Acute respiratory failure with hypoxemia (HCC)   Acute on chronic respiratory failure (HCC)   Influenza A   Acute respiratory failure (HCC)   Pneumothorax, traumatic    Acute on chronic hypoxic respiratory failure Currently on trach collar.  Pulmonology and respiratory therapist are following.  Speech therapy is following as well.  Dysphagia Felt to be due to generalized weakness from acute illness and tracheostomy.  Speech therapy is following.  Currently on tube feedings.  Influenza A She has completed a course of treatment.  She is asymptomatic.  History of COPD with acute exacerbation Pulmonology is following.  She has stabilized.  Steroids are being slowly weaned.  Elevated WBC most likely due to steroids.  Pneumothorax, right-sided, iatrogenic during NG tube placement She had a chest tube placed by pulmonology.  Removed on 1/18.  Stable.  Right lower lobe infiltrate Treated with 5 days of empiric Zosyn.  Appears to be stable currently.  Hypokalemia Resolved.  History of GERD Pepcid  DVT Prophylaxis: Subcutaneous heparin    Code Status: Full code Family Communication: Discussed with patient.  No family at bedside. Disposition Plan: Management as outlined above.  Social worker is following.  Family prefers for her to come home however she may need placement for rehab before she can go home.    LOS: 23 days   [facility] Pager [phone] 10/19/2017, 9:27 AM  If 7PM-7AM, please contact night-coverage at [email or web address], password [facility]

## 2017-10-19 NOTE — Progress Notes (Signed)
Physical Therapy Treatment Patient Details Name: Sally [name] MRN: [id number] DOB: 12-15-1950 Today's Date: 10/19/2017    History of Present Illness Pt is a 67 y/o female admitted secondary to a COPD exacerbation and +Flu. Pt required intubation    PT Comments    Pt admitted with above diagnosis. Pt currently with functional limitations due to the deficits listed below (see PT Problem List). Pt was able to transfer with Stedy to chair.  Pt needed max assist of 2 to stand to [place].  Passive movement to chair in [place] and then second stand from [place] max assist of 2.  Needs SNF for rehab. Pt will benefit from skilled PT to increase their independence and safety with mobility to allow discharge to the venue listed below.     Follow Up Recommendations  SNF;Supervision/Assistance - 24 hour     Equipment Recommendations  None recommended by PT    Recommendations for Other Services       Precautions / Restrictions Precautions Precautions: Fall;Other (comment) Precaution Comments: trach collar, rectal tube, NG tube Restrictions Weight Bearing Restrictions: No    Mobility  Bed Mobility Overal bed mobility: Needs Assistance Bed Mobility: Supine to Sit Rolling: +2 for physical assistance;Mod assist Sidelying to sit: +2 for physical assistance;Mod assist Supine to sit: +2 for physical assistance;Mod assist     General bed mobility comments: increased time and effort. Needed assist for right LE to eOB and elevation of trunk.   Transfers Overall transfer level: Needs assistance Equipment used: Ambulation equipment used Transfers: Sit to/from Stand Sit to Stand: Max assist;+2 physical assistance         General transfer comment: increased time and effort, pt with grasping Stedy for support. Was able to complete stand enough to get buttock pads under pt. Pt unable to achieve full erect standing position, maintaining a semi- flexed posture. Pt performed sit<>stand from EOB and then  from [facility].   Ambulation/Gait                 Stairs            Wheelchair Mobility    Modified Rankin (Stroke Patients Only)       Balance Overall balance assessment: Needs assistance Sitting-balance support: Bilateral upper extremity supported;Feet supported Sitting balance-Leahy Scale: Poor Sitting balance - Comments: min guard maintain sitting balance x 5 minutes   Standing balance support: During functional activity;Bilateral upper extremity supported Standing balance-Leahy Scale: Poor Standing balance comment: Had to have bil UE support and max assist to come to standing. Could not sustain standind more than 15 seconds as PT and tech were cleaning pt as flexiseal leaking                             Cognition Arousal/Alertness: Awake/alert Behavior During Therapy: WFL for tasks assessed/performed Overall Cognitive Status: Difficult to assess                                 General Comments: Able to follow simple commands throughout session.      Exercises General Exercises - Lower Extremity Long Arc Quad: AROM;Right;Left;5 reps;Seated Heel Slides: AAROM;Right;Left;5 reps;Supine Hip ABduction/ADduction: AAROM;Right;Left;5 reps;Supine    General Comments        Pertinent Vitals/Pain Pain Assessment: Faces Faces Pain Scale: Hurts even more Pain Location: abdomen when sitting up Pain Descriptors / Indicators: Grimacing;Sore Pain Intervention(s): Limited  activity within patient's tolerance;Monitored during session;Repositioned  VSS  Home Living                      Prior Function            PT Goals (current goals can now be found in the care plan section) Progress towards PT goals: Progressing toward goals    Frequency    Min 2X/week      PT Plan Current plan remains appropriate    Co-evaluation              AM-PAC PT "6 Clicks" Daily Activity  Outcome Measure  Difficulty turning over in  bed (including adjusting bedclothes, sheets and blankets)?: Unable Difficulty moving from lying on back to sitting on the side of the bed? : Unable Difficulty sitting down on and standing up from a chair with arms (e.g., wheelchair, bedside commode, etc,.)?: Unable Help needed moving to and from a bed to chair (including a wheelchair)?: Total Help needed walking in hospital room?: Total Help needed climbing 3-5 steps with a railing? : Total 6 Click Score: 6    End of Session Equipment Utilized During Treatment: Gait belt;Oxygen;Other (comment)(trach collar) Activity Tolerance: Patient limited by fatigue Patient left: with call bell/phone within reach;in chair;with chair alarm set Nurse Communication: Mobility status;Need for lift equipment PT Visit Diagnosis: Other abnormalities of gait and mobility (R26.89);Muscle weakness (generalized) (M62.81)     Time: 6962-9528 PT Time Calculation (min) (ACUTE ONLY): 22 min  Charges:  $Therapeutic Activity: 8-22 mins                    G Codes:       Zyrah Wiswell,PT Acute Rehabilitation [phone] (pager)    Berline Lopes 10/19/2017, 1:47 PM

## 2017-10-20 ENCOUNTER — Inpatient Hospital Stay (HOSPITAL_COMMUNITY): Payer: Medicare Other

## 2017-10-20 DIAGNOSIS — Z930 Tracheostomy status: Secondary | ICD-10-CM

## 2017-10-20 DIAGNOSIS — F419 Anxiety disorder, unspecified: Secondary | ICD-10-CM

## 2017-10-20 DIAGNOSIS — R0902 Hypoxemia: Secondary | ICD-10-CM

## 2017-10-20 LAB — GLUCOSE, CAPILLARY
GLUCOSE-CAPILLARY: 147 mg/dL — AB (ref 65–99)
GLUCOSE-CAPILLARY: 139 mg/dL — AB (ref 65–99)
Glucose-Capillary: 107 mg/dL — ABNORMAL HIGH (ref 65–99)
Glucose-Capillary: 112 mg/dL — ABNORMAL HIGH (ref 65–99)
Glucose-Capillary: 93 mg/dL (ref 65–99)

## 2017-10-20 MED ORDER — RESOURCE THICKENUP CLEAR PO POWD
ORAL | Status: DC | PRN
  Filled 2017-10-20: qty 125

## 2017-10-20 MED ORDER — MORPHINE SULFATE (PF) 4 MG/ML IV SOLN
1.0000 mg | INTRAVENOUS | Status: DC | PRN
  Administered 2017-10-21 (×2): 1 mg via INTRAVENOUS
  Filled 2017-10-20 (×2): qty 1

## 2017-10-20 MED ORDER — HYDROCODONE-ACETAMINOPHEN 7.5-325 MG/15ML PO SOLN
10.0000 mL | Freq: Four times a day (QID) | ORAL | Status: DC | PRN
  Administered 2017-10-20: 10 mL
  Filled 2017-10-20: qty 15

## 2017-10-20 MED ORDER — HYDROCODONE-ACETAMINOPHEN 7.5-325 MG/15ML PO SOLN: 10.0000 mL | Freq: Four times a day (QID) | ORAL | Status: DC | PRN

## 2017-10-20 MED ORDER — HYDROCODONE-ACETAMINOPHEN 7.5-325 MG/15ML PO SOLN
10.0000 mL | Freq: Four times a day (QID) | ORAL | Status: DC | PRN
  Administered 2017-10-20 – 2017-10-22 (×3): 10 mL
  Filled 2017-10-20 (×3): qty 15

## 2017-10-20 NOTE — Progress Notes (Signed)
Nutrition Follow-up  DOCUMENTATION CODES:   Not applicable  INTERVENTION:    Continue Vital AF 1.2 at 50 ml/h (1200 ml per day)  Provides 1440 kcal, 90 gm protein, 973 ml free water daily  NUTRITION DIAGNOSIS:   Inadequate oral intake related to inability to eat as evidenced by NPO status, ongoing  GOAL:   Provide needs based on ASPEN/SCCM guidelines, met  MONITOR:   Vent status, TF tolerance, Labs, Skin, Weight trends, I & O's  ASSESSMENT:   67 yo female with PMH of COPD, HTN, Crohn's DZ, asthma who was admitted on 1/1 with COPD exacerbation, requiring intubation.  1/15 S/P trach  1/16 S/P 10 F small bore tube placed per IR  Patient is currently on ventilator support MV: 9.3 L/min Temp (24hrs), Avg:97.9 F (36.6 C), Min:97.6 F (36.4 C), Max:98 F (36.7 C)  Vital High Protein currently infusing at goal rate of 50 ml/hr. S/p MBSS today. Advanced to Dys 2-Nectar thick liquids. Labs and medications reviewed. Free water flushes 100 ml TID. CBG's [phone].  Diet Order:  DIET DYS 2 Room service appropriate? Yes; Fluid consistency: Nectar Thick  EDUCATION NEEDS:   No education needs have been identified at this time  Skin:  Skin Assessment: Reviewed RN Assessment  Last BM:  1/25  Intake/Output Summary (Last 24 hours) at 10/20/2017 1441 Last data filed at 10/20/2017 0901 Gross per 24 hour  Intake 1060 ml  Output 950 ml  Net 110 ml   Height:   Ht Readings from Last 1 Encounters:  09/26/17 5' 1"  (1.549 m)    Weight:   Wt Readings from Last 1 Encounters:  10/20/17 155 lb 6.8 oz (70.5 kg)    Ideal Body Weight:  47.7 kg  BMI:  Body mass index is 29.37 kg/m.  Estimated Nutritional Needs:   Kcal:  1300-1500  Protein:  75-90 gm  Fluid:  1.5 L  Arthur Holms, RD, LDN Pager #: [phone] After-Hours Pager #: [phone]

## 2017-10-20 NOTE — Progress Notes (Signed)
Modified Barium Swallow Progress Note  Patient Details  Name: Sally [name] MRN: [id number] Date of Birth: [birth year]  Today's Date: 10/20/2017  Modified Barium Swallow completed.  Full report located under Chart Review in the Imaging Section.  Brief recommendations include the following:  Clinical Impression      Pt presents with a moderate pharyngeal dysphagia indicated by reduced hylolaryngeal excursion, airway/laryngeal closure, and eppiglottic inversion leading to penetration and aspiration of thin liquids. Pt was tested with and without PMV in place. With PMV in place while also using chin tuck strategy, she had no more aspiration, but continued to penetrate. Pt was able to reduce penetration with max cues to throat clear/cough and complete subsequent swallows. Pt appears to have osteophytes at the level of the epiglottis, contributing to the reduced epiglottic inversion during swallow. Pt protected airway for all other consistencies observed. For all solids pt had increased oral holding, bolus manipulation/formation time, and lingual residue, but oral characteristics did not appear to impact swallow safety. Recommend initiation of Dysphagia 2 (chopped) solids and nectar thick liquids with medications administered in puree. Follow aspiration precautions: PMV in place for meals, small/slow bites, intermittent throat clear/cough, multiple swallows (especially with solids), and limiting distractions to lessen anxiety. Pt's dyspahgia is likley to improve with continued healing/time from intubation with hopeful progression to thin liquids. SLP will continue to follow to determine safety with diet and plan for possible advancement.          Swallow Evaluation Recommendations       SLP Diet Recommendations: Dysphagia 2 (Fine chop) solids;Nectar thick liquid   Liquid Administration via: Straw;Cup   Medication Administration: Whole meds with puree   Supervision: Intermittent supervision to cue  for compensatory strategies   Compensations: Multiple dry swallows after each bite/sip;Small sips/bites;Slow rate;Minimize environmental distractions   Postural Changes: Seated upright at 90 degrees   Oral Care Recommendations: Oral care BID   Other Recommendations: Order thickener from pharmacy;Prohibited food (jello, ice cream, thin soups);Remove water pitcher;Place PMSV during PO intake  [place] Kalasia Crafton SLP Student Clinician  [place] Jeni Duling 10/20/2017,2:59 PM

## 2017-10-20 NOTE — Progress Notes (Signed)
Occupational Therapy Treatment Patient Details Name: Sally [name] MRN: [id number] DOB: Apr 12, 1951 Today's Date: 10/20/2017    History of present illness Pt is a 67 y/o female admitted secondary to a COPD exacerbation and +Flu. Pt required intubation   OT comments  Pt is now able to self feed, perform grooming with set up and supervision. She completed 2 sets of 5 shoulder AROM exercises with rest breaks between sets and 10 reps of elbow to hand ROM. Pt fatigues easily, but VS remained stable. Pt is hopeful to have her trach removed soon. Continues to be appropriate for SNF level rehab.  Follow Up Recommendations  SNF;Supervision/Assistance - 24 hour    Equipment Recommendations       Recommendations for Other Services      Precautions / Restrictions Precautions Precautions: Fall;Other (comment) Precaution Comments: trach collar, rectal tube, NG tube       Mobility Bed Mobility                  Transfers                      Balance                                           ADL either performed or assessed with clinical judgement   ADL Overall ADL's : Needs assistance/impaired Eating/Feeding: Set up;Supervision/ safety;Bed level   Grooming: Wash/dry hands;Wash/dry face;Bed level;Set up                                 General ADL Comments: Pt had just returned to chair, limited activity to bed level.     Vision       Perception     Praxis      Cognition Arousal/Alertness: Awake/alert Behavior During Therapy: WFL for tasks assessed/performed Overall Cognitive Status: Within Functional Limits for tasks assessed                                 General Comments: pt writing questions initially, placed PSMV on for 12 minutes of session with good tolerance while talking        Exercises Exercises: General Upper Extremity General Exercises - Upper Extremity Shoulder Flexion: AROM;10  reps;Seated Shoulder Horizontal ABduction: AROM;10 reps;Seated Shoulder Horizontal ADduction: AROM;Both;10 reps;Seated Elbow Flexion: AROM;Both;10 reps;Seated Elbow Extension: AROM;Both;10 reps;Seated Digit Composite Flexion: AROM;Both;10 reps;Seated Composite Extension: AROM;Both;10 reps;Seated   Shoulder Instructions       General Comments      Pertinent Vitals/ Pain       Pain Assessment: Faces Faces Pain Scale: No hurt Pain Location: when transitioning to bed from chair Pain Descriptors / Indicators: Grimacing Pain Intervention(s): Limited activity within patient's tolerance  Home Living                                          Prior Functioning/Environment              Frequency  Min 2X/week        Progress Toward Goals  OT Goals(current goals can now be found in the care plan section)  Progress towards OT goals:  Progressing toward goals  Acute Rehab OT Goals Patient Stated Goal: to breathe and walk OT Goal Formulation: With patient Time For Goal Achievement: 10/28/17 Potential to Achieve Goals: Good  Plan Discharge plan remains appropriate    Co-evaluation                 AM-PAC PT "6 Clicks" Daily Activity     Outcome Measure   Help from another person eating meals?: A Little Help from another person taking care of personal grooming?: A Lot Help from another person toileting, which includes using toliet, bedpan, or urinal?: Total Help from another person bathing (including washing, rinsing, drying)?: A Lot Help from another person to put on and taking off regular upper body clothing?: A Little Help from another person to put on and taking off regular lower body clothing?: Total 6 Click Score: 12    End of Session    OT Visit Diagnosis: Other abnormalities of gait and mobility (R26.89);Muscle weakness (generalized) (M62.81);Pain   Activity Tolerance Patient limited by fatigue   Patient Left in bed;with bed alarm  set   Nurse Communication Other (comment)(PSMV removed at end of session, good tolerance)        Time: 7846-9629 OT Time Calculation (min): 26 min  Charges: OT General Charges $OT Visit: 1 Visit OT Treatments $Self Care/Home Management : 8-22 mins $Therapeutic Exercise: 8-22 mins  10/20/2017 Martie Round, OTR/L Pager: [phone]   [place] B[facility]iff 10/20/2017, 4:01 PM

## 2017-10-20 NOTE — Progress Notes (Signed)
[facility] PROGRESS NOTE  Sally [name] VAP:[id number] DOB: [birth year] DOA: 09/26/2017  PCP: [name], MD  Brief History/Interval Summary: 67 year old female with a history of severe COPD tobacco abuse presented with shortness of breath.  Had to be intubated for respiratory failure.  Subsequently underwent tracheostomy.  Also developed pneumothorax requiring chest tube.  Subsequently transferred to stepdown unit.   Reason for Visit: Acute on chronic hypoxic respiratory failure  Consultants: Pulmonology  Procedures: Tracheostomy.  Chest tube placement for pneumothorax.  Antibiotics: Zosyn 1/16 > 1/21 Ceftriaxone 1/5 > 1/9 Tamiflu 1/2 > 1/6   Subjective/Interval History: Patient continues to feel better.  Continues to have some cough though that is better as well.  Complains of back pain and some chest discomfort when she coughs.  Not otherwise.  ROS: Denies any nausea vomiting.  Objective:  Vital Signs  Vitals:   10/20/17 0324 10/20/17 0730 10/20/17 0848 10/20/17 0852  BP: 130/61  (!) 141/64   Pulse: 94 93 98   Resp: (!) 21 20 (!) 23   Temp:   97.6 F (36.4 C)   TempSrc:   Oral   SpO2: 99% 98% 95% 99%  Weight: 70.5 kg (155 lb 6.8 oz)     Height:        Intake/Output Summary (Last 24 hours) at 10/20/2017 1000 Last data filed at 10/20/2017 0901 Gross per 24 hour  Intake 1060 ml  Output 950 ml  Net 110 ml   Filed Weights   10/18/17 0450 10/19/17 0401 10/20/17 0324  Weight: 71.3 kg (157 lb 3 oz) 71.2 kg (156 lb 15.5 oz) 70.5 kg (155 lb 6.8 oz)    General appearance: Awake alert.  In no distress Resp: Improving air entry bilaterally.  No wheezing rales or rhonchi.  Normal effort at rest.   Cardio: S1-S2 is normal regular.  No S3-S4.  No rubs murmurs or bruit GI: Abdomen is soft.  Nontender nondistended.  Bowel sounds are present.  No masses organomegaly  Extremities: No edema noted Neurologic: No obvious focal neurological deficits.  Generalized  weakness is present.  Lab Results:  Data Reviewed: I have personally reviewed following labs and imaging studies  CBC: Recent Labs  Lab 10/14/17 0442 10/17/17 0312  WBC 21.5* 22.2*  HGB 9.7* 10.7*  HCT 29.4* 32.8*  MCV 92.7 93.4  PLT 283 143    Basic Metabolic Panel: Recent Labs  Lab 10/14/17 0442 10/15/17 0258 10/17/17 0312 10/18/17 0710  NA 140 139 140 137  K 3.5 4.2 3.3* 4.0  CL 106 108 106 102  CO2 23 20* 21* 24  GLUCOSE 111* 116* 110* 115*  BUN 20 20 25* 22*  CREATININE 0.47 0.47 0.44 0.45  CALCIUM 8.1* 8.2* 8.5* 8.4*  MG  --   --   --  2.1    GFR: Estimated Creatinine Clearance: 63 mL/min (by C-G formula based on SCr of 0.45 mg/dL).  Liver Function Tests: Recent Labs  Lab 10/14/17 0442 10/17/17 0312  AST 36 38  ALT 118* 112*  ALKPHOS 95 96  BILITOT 0.7 0.7  PROT 5.5* 5.8*  ALBUMIN 2.4* 2.7*    CBG: Recent Labs  Lab 10/19/17 1558 10/19/17 1929 10/19/17 2344 10/20/17 0356 10/20/17 0807  GLUCAP 119* 119* 89 107* 112*     Radiology Studies: No results found.   Medications:  Scheduled: . arformoterol  15 mcg Nebulization BID  . aspirin  81 mg Per Tube Daily  . budesonide (PULMICORT) nebulizer solution  0.5 mg  Nebulization BID  . chlorhexidine gluconate (MEDLINE KIT)  15 mL Mouth Rinse BID  . Chlorhexidine Gluconate Cloth  6 each Topical Daily  . diltiazem  30 mg Per Tube Q8H  . famotidine  20 mg Per Tube BID  . free water  100 mL Per Tube Q8H  . heparin  5,000 Units Subcutaneous Q8H  . insulin aspart  0-15 Units Subcutaneous Q4H  . mouth rinse  15 mL Mouth Rinse QID  . nicotine  21 mg Transdermal Daily  . [START ON 10/21/2017] predniSONE  20 mg Oral Q breakfast   Followed by  . [START ON 10/26/2017] predniSONE  10 mg Oral Q breakfast   Followed by  . [START ON 10/31/2017] predniSONE  5 mg Oral Q breakfast   Continuous: . feeding supplement (VITAL AF 1.2 CAL) 1,000 mL (10/19/17 2339)   TDH:RCBULAGTXMIWO (TYLENOL) oral liquid 160  mg/5 mL, gi cocktail, guaiFENesin, hydrALAZINE, HYDROcodone-acetaminophen, LORazepam, ondansetron (ZOFRAN) IV, sennosides, sodium chloride flush  Assessment/Plan:  Active Problems:   Acute respiratory failure with hypoxemia (HCC)   Acute on chronic respiratory failure (HCC)   Influenza A   Acute respiratory failure (HCC)   Pneumothorax, traumatic    Acute on chronic hypoxic respiratory failure Currently on trach collar.  Pulmonology and respiratory therapist are following.  Speech therapy is following as well.  Pulmonology to see the patient later today.  Discussed with Dr. Nelda Marseille.  Patient has pleuritic component to her pain.  She also mentions some anxiety.  Give morphine for now.  Plan will be to transition her to oral medications eventually.  Dysphagia Felt to be due to generalized weakness from acute illness and tracheostomy.  Speech therapy is following.  Currently on tube feedings.  Influenza A She has completed course of treatment.  She is asymptomatic.  History of COPD with acute exacerbation Pulmonology is following.  She has stabilized.  Steroids are being slowly weaned.  Elevated WBC most likely due to steroids.  Repeat labs tomorrow.  Pneumothorax, right-sided, iatrogenic during NG tube placement She had a chest tube placed by pulmonology.  Removed on 1/18.  Stable.  Right lower lobe infiltrate Treated with 5 days of empiric Zosyn.  Appears to be stable currently.  Hypokalemia Resolved.  History of GERD Pepcid  DVT Prophylaxis: Subcutaneous heparin    Code Status: Full code Family Communication: Discussed with the patient Disposition Plan: Management as outlined above.  Disposition is not entirely clear at this time.  Social worker is following.    LOS: 24 days   [facility] Pager [phone] 10/20/2017, 10:00 AM  If 7PM-7AM, please contact night-coverage at [email or web address], password [facility]

## 2017-10-20 NOTE — Progress Notes (Signed)
PULMONARY / CRITICAL CARE MEDICINE   Name: Sally [name] MRN: [id number] DOB: [birth year]    ADMISSION DATE:  09/26/2017 CONSULTATION DATE:  1/1  REFERRING MD:  Juleen China (EDP)   CHIEF COMPLAINT:  Dyspnea   HISTORY OF PRESENT ILLNESS:   67 year old female smoker with hx severe COPD presented 1/1 with dyspnea. Failed bipap and was intubated in ER.  Flu A POS.  Prolonged course, difficulty weaning from vent.  Now s/p trach 1/15.    SUBJECTIVE:  No significant change.  Plan for swallowing evaluation.  Question if she can be downsized from a cuff to an uncuffed trach.  VITAL SIGNS: BP (!) 141/64 (BP Location: Right Arm)   Pulse 86   Temp 97.6 F (36.4 C) (Oral)   Resp (!) 21   Ht 5\' 1"  (1.549 m)   Wt 70.5 kg (155 lb 6.8 oz)   SpO2 98%   BMI 29.37 kg/m   HEMODYNAMICS:    VENTILATOR SETTINGS: FiO2 (%):  [28 %-35 %] 28 %  INTAKE / OUTPUT: I/O last 3 completed shifts: In: 2460 [NG/GT:2460] Out: 450 [Urine:450]  PHYSICAL EXAMINATION: General: 67 year old female who trach collar for greater than 5 days HEENT: Trach collar in place, #6 cuffed trach in place, no JVD is appreciated PSY: Teary and agitated Neuro: Moves all extremities x4 no foot drop CV: Sinus rhythm with ventricular rate of 90 PULM: Decreased breath sounds in the bases, poor phonation with Passy-Muir valve WU:JWJX, non-tender, bsx4 active  Extremities: warm/dry, negative edema bilateral foot drop noted Skin: no rashes or lesions   LABS:  BMET Recent Labs  Lab 10/15/17 0258 10/17/17 0312 10/18/17 0710  NA 139 140 137  K 4.2 3.3* 4.0  CL 108 106 102  CO2 20* 21* 24  BUN 20 25* 22*  CREATININE 0.47 0.44 0.45  GLUCOSE 116* 110* 115*    Electrolytes Recent Labs  Lab 10/15/17 0258 10/17/17 0312 10/18/17 0710  CALCIUM 8.2* 8.5* 8.4*  MG  --   --  2.1    CBC Recent Labs  Lab 10/14/17 0442 10/17/17 0312  WBC 21.5* 22.2*  HGB 9.7* 10.7*  HCT 29.4* 32.8*  PLT 283 335    Coag's No results  for input(s): APTT, INR in the last 168 hours.  Sepsis Markers No results for input(s): LATICACIDVEN, PROCALCITON, O2SATVEN in the last 168 hours.  ABG No results for input(s): PHART, PCO2ART, PO2ART in the last 168 hours.  Liver Enzymes Recent Labs  Lab 10/14/17 0442 10/17/17 0312  AST 36 38  ALT 118* 112*  ALKPHOS 95 96  BILITOT 0.7 0.7  ALBUMIN 2.4* 2.7*    Cardiac Enzymes No results for input(s): TROPONINI, PROBNP in the last 168 hours.  Glucose Recent Labs  Lab 10/19/17 1558 10/19/17 1929 10/19/17 2344 10/20/17 0356 10/20/17 0807 10/20/17 1127  GLUCAP 119* 119* 89 107* 112* 147*    Imaging No results found.   STUDIES:    CULTURES: 1/1 RVP>>> POS flu A  1/1 sputum >>> rare coag neg staph   ANTIBIOTICS:   SIGNIFICANT EVENTS: 10/20/2017 for swallowing eval  LINES/TUBES: ETT 1/1>>>1/15 Trach 1/15>>> Chest tube >>> out 1/18  DISCUSSION: 66yo female with respiratory failure r/t AECOPD, flu A   ASSESSMENT / PLAN:  Acute respiratory failure  AECOPD Iatrogenic ptx - resolved, chest tube out  Flu A  Tobacco abuse  P:   Continue ATC as tolerated, if she can be downsized to a smaller trach.  Creases are noted to be thick therefore  will not downsize at this time will reevaluate next week. Pulmonary hygiene  Intermittent f/u CXR  Continue prednisone with slow taper - in place  BD's  Off antibiotic Speech to see for swallow and PMV   TRH primary  PCCM will f/u twice weekly for trach needs. Please call sooner if needed.   FAMILY  - Updates: no family at bedside 10/20/2017   [place] Minor ACNP Ad[facility]ack PCCM Pager [phone] till 1 pm If no answer page [phone] 10/20/2017, 12:13 PM  Attending Note:  67 year old female with COPD and associated respiratory failure, failed to come off the vent and was trached during this admission.  Now off the vent with clear lungs on exam.  I reviewed CXR myself, trach is in good position.  Respiratory  failure:  - Maintain off the vent  COPD:  - BD as ordered  Trach status:  - If no vent needs over the weekend then will change to a cuffless 4 on Monday and consider decannulation later on the week.  Hypoxemia:  - Titrate O2 for sat of 88-92%.  PCCM will see again on Monday.  Patient seen and examined, agree with above note.  I dictated the care and orders written for this patient under my direction.  Alyson Reedy, MD [phone]

## 2017-10-21 DIAGNOSIS — E876 Hypokalemia: Secondary | ICD-10-CM

## 2017-10-21 LAB — CBC
HEMATOCRIT: 33.3 % — AB (ref 36.0–46.0)
Hemoglobin: 10.9 g/dL — ABNORMAL LOW (ref 12.0–15.0)
MCH: 31.4 pg (ref 26.0–34.0)
MCHC: 32.7 g/dL (ref 30.0–36.0)
MCV: 96 fL (ref 78.0–100.0)
PLATELETS: 383 10*3/uL (ref 150–400)
RBC: 3.47 MIL/uL — ABNORMAL LOW (ref 3.87–5.11)
RDW: 17.6 % — AB (ref 11.5–15.5)
WBC: 13.9 10*3/uL — ABNORMAL HIGH (ref 4.0–10.5)

## 2017-10-21 LAB — BASIC METABOLIC PANEL
ANION GAP: 13 (ref 5–15)
BUN: 19 mg/dL (ref 6–20)
CO2: 24 mmol/L (ref 22–32)
Calcium: 8.9 mg/dL (ref 8.9–10.3)
Chloride: 100 mmol/L — ABNORMAL LOW (ref 101–111)
Creatinine, Ser: 0.43 mg/dL — ABNORMAL LOW (ref 0.44–1.00)
GFR calc Af Amer: >60 mL/min (ref >60)
GFR calc non Af Amer: >60 mL/min (ref >60)
GLUCOSE: 92 mg/dL (ref 65–99)
POTASSIUM: 3.1 mmol/L — AB (ref 3.5–5.1)
Sodium: 137 mmol/L (ref 135–145)

## 2017-10-21 LAB — GLUCOSE, CAPILLARY
GLUCOSE-CAPILLARY: 105 mg/dL — AB (ref 65–99)
GLUCOSE-CAPILLARY: 111 mg/dL — AB (ref 65–99)
GLUCOSE-CAPILLARY: 93 mg/dL (ref 65–99)
Glucose-Capillary: 103 mg/dL — ABNORMAL HIGH (ref 65–99)
Glucose-Capillary: 83 mg/dL (ref 65–99)
Glucose-Capillary: 84 mg/dL (ref 65–99)
Glucose-Capillary: 91 mg/dL (ref 65–99)

## 2017-10-21 MED ORDER — POTASSIUM CHLORIDE CRYS ER 20 MEQ PO TBCR
40.0000 meq | EXTENDED_RELEASE_TABLET | Freq: Once | ORAL | Status: AC
  Administered 2017-10-21: 40 meq via ORAL
  Filled 2017-10-21: qty 2

## 2017-10-21 NOTE — Progress Notes (Signed)
Cortrak d/c'd by liason; pt tolerated well. Pt encouraged to take more POs. Pt actively participating in treatment plan.

## 2017-10-21 NOTE — Progress Notes (Signed)
Cortrak Tube Team Note:  Consult received to replace a Cortrak feeding tube due to it being occluded.   RD noted patients diet had been advanced. Per ST note above bed, pt cleared for D2 NTL diet and Meds in puree.   RD paged/called MD regarding need to replace tube if on oral diet. MD okay with changing meds to PO and removing tube.   RD removed bridle and cortrak tube.   Christophe Louis RD, LDN, CNSC Clinical Nutrition Pager: [phone] 10/21/2017 12:47 PM

## 2017-10-21 NOTE — Progress Notes (Signed)
 [facility] PROGRESS NOTE  Sally [name] MRN:[id number] DOB: 10/25/1950 DOA: 09/26/2017  PCP: Cabeza, Yuri M., MD  Brief History/Interval Summary: 67-year-old female with a history of severe COPD tobacco abuse presented with shortness of breath.  Had to be intubated for respiratory failure.  Subsequently underwent tracheostomy.  Also developed pneumothorax requiring chest tube.  Subsequently transferred to stepdown unit.   Reason for Visit: Acute on chronic hypoxic respiratory failure  Consultants: Pulmonology  Procedures: Tracheostomy.  Chest tube placement for pneumothorax.  Antibiotics: Zosyn 1/16 > 1/21 Ceftriaxone 1/5 > 1/9 Tamiflu 1/2 > 1/6   Subjective/Interval History: Patient continues to feel better.  Cough is persistent but better.  Pain issues also improved.  No other complaints offered.  She understands the need for rehab and will think about skilled nursing facility.  ROS: Denies any nausea or vomiting  Objective:  Vital Signs  Vitals:   10/21/17 0316 10/21/17 0331 10/21/17 0700 10/21/17 0804  BP: 130/67   (!) 159/67  Pulse: 83   84  Resp: 17   (!) 21  Temp:  97.8 F (36.6 C) 98.1 F (36.7 C)   TempSrc:  Oral Oral   SpO2: 100%     Weight:      Height:        Intake/Output Summary (Last 24 hours) at 10/21/2017 1112 Last data filed at 10/21/2017 0600 Gross per 24 hour  Intake 1300 ml  Output 700 ml  Net 600 ml   Filed Weights   10/19/17 0401 10/20/17 0324 10/21/17 0238  Weight: 71.2 kg (156 lb 15.5 oz) 70.5 kg (155 lb 6.8 oz) 70.1 kg (154 lb 8.7 oz)    General appearance: Awake alert.  In no distress. Resp: Coarse breath sounds but improved air entry bilaterally.  No wheezing rales or rhonchi. Cardio: S1-S2 is normal regular.  No S3-S4.  No rubs murmurs or bruit GI: Abdomen is soft.  Nontender nondistended.  Bowel sounds are present.  No masses or organomegaly  Extremities: No edema noted Neurologic: No obvious focal neurological  deficits.  Generalized weakness is present.  Lab Results:  Data Reviewed: I have personally reviewed following labs and imaging studies  CBC: Recent Labs  Lab 10/17/17 0312 10/21/17 0437  WBC 22.2* 13.9*  HGB 10.7* 10.9*  HCT 32.8* 33.3*  MCV 93.4 96.0  PLT 335 383    Basic Metabolic Panel: Recent Labs  Lab 10/15/17 0258 10/17/17 0312 10/18/17 0710 10/21/17 0437  NA 139 140 137 137  K 4.2 3.3* 4.0 3.1*  CL 108 106 102 100*  CO2 20* 21* 24 24  GLUCOSE 116* 110* 115* 92  BUN 20 25* 22* 19  CREATININE 0.47 0.44 0.45 0.43*  CALCIUM 8.2* 8.5* 8.4* 8.9  MG  --   --  2.1  --     GFR: Estimated Creatinine Clearance: 62.8 mL/min (A) (by C-G formula based on SCr of 0.43 mg/dL (L)).  Liver Function Tests: Recent Labs  Lab 10/17/17 0312  AST 38  ALT 112*  ALKPHOS 96  BILITOT 0.7  PROT 5.8*  ALBUMIN 2.7*    CBG: Recent Labs  Lab 10/20/17 1655 10/20/17 1956 10/21/17 0006 10/21/17 0329 10/21/17 0754  GLUCAP 139* 93 84 91 105*     Radiology Studies: Dg Swallowing Func-speech Pathology  Result Date: 10/20/2017 Objective Swallowing Evaluation: Type of Study: MBS-Modified Barium Swallow Study  Patient Details Name: Mariyah Carolan MRN: [id number] Date of Birth: 12/28/1950 Today's Date: 10/20/2017 Time: SLP Start Time (ACUTE ONLY): 1313 -  SLP Stop Time (ACUTE ONLY): 1336 SLP Time Calculation (min) (ACUTE ONLY): 23 min Past Medical History: Past Medical History: Diagnosis Date . Asthma  . Back pain  . COPD (chronic obstructive pulmonary disease) ([place])  . Crohn disease ([place])  . Hypertension  Past Surgical History: Past Surgical History: Procedure Laterality Date . ABDOMINAL HYSTERECTOMY   . CATARACT EXTRACTION, BILATERAL   . FRACTURE SURGERY Left   ARM HPI: 67 year old female with a past medical history significant for severe COPDcame to the emergency department on 09/26/2017 complaining of shortness of breath. Intubated on arrival. Two failed extubations: 1/8 and 1/11. Trach  placed 1/15.  Subjective: Pt anxious about MBS, but cooperative to commands Assessment / Plan / Recommendation CHL IP CLINICAL IMPRESSIONS 10/20/2017 Clinical Impression Pt presents with a moderate pharyngeal dysphagia indicated by reduced hylolaryngeal excursion, airway/laryngeal closure, and eppiglottic inversion leading to penetration and aspiration of thin liquids. Pt was tested with and without PMV in place. With PMV in place while also using chin tuck strategy, she had no more aspiration, but continued to penetrate. Pt was able to reduce penetration with max cues to throat clear/cough and complete subsequent swallows. Pt appears to have osteophytes at the level of the epiglottis, contributing to the reduced epiglottic inversion during swallow. Pt protected airway for all other consistencies observed. For all solids pt had increased oral holding, bolus manipulation/formation time, and lingual residue, but oral characteristics did not appear to impact swallow safety. Recommend initiation of Dysphagia 2 (chopped) solids and nectar thick liquids with medications administered in puree. Follow aspiration precautions: PMV in place for meals, small/slow bites, intermittent throat clear/cough, multiple swallows (especially with solids), and limiting distractions to lessen anxiety. Pt's dyspahgia is likley to improve with continued healing/time from intubation with hopeful progression to thin liquids. SLP will continue to follow to determine safety with diet and plan for possible advancement.   SLP Visit Diagnosis Dysphagia, oropharyngeal phase (R13.12) Attention and concentration deficit following -- Frontal lobe and executive function deficit following -- Impact on safety and function Moderate aspiration risk   CHL IP TREATMENT RECOMMENDATION 10/20/2017 Treatment Recommendations Therapy as outlined in treatment plan below   Prognosis 10/20/2017 Prognosis for Safe Diet Advancement Good Barriers to Reach Goals Behavior  Barriers/Prognosis Comment -- CHL IP DIET RECOMMENDATION 10/20/2017 SLP Diet Recommendations Dysphagia 2 (Fine chop) solids;Nectar thick liquid Liquid Administration via Straw;Cup Medication Administration Whole meds with puree Compensations Multiple dry swallows after each bite/sip;Small sips/bites;Slow rate;Minimize environmental distractions Postural Changes Seated upright at 90 degrees   CHL IP OTHER RECOMMENDATIONS 10/20/2017 Recommended Consults -- Oral Care Recommendations Oral care BID Other Recommendations Order thickener from pharmacy;Prohibited food (jello, ice cream, thin soups);Remove water pitcher;Place PMSV during PO intake   CH[facility] IP FOLLOW UP RECOMMENDATIONS 10/20/2017 Follow up Recommendations Skilled Nursing facility   [facility] IP FREQUENCY AND DURATION 10/20/2017 Speech Therapy Frequency (ACUTE ONLY) min 2x/week Treatment Duration 2 weeks      CHL IP ORAL PHASE 10/20/2017 Oral Phase Impaired Oral - Pudding Teaspoon -- Oral - Pudding Cup -- Oral - Honey Teaspoon -- Oral - Honey Cup -- Oral - Nectar Teaspoon WFL Oral - Nectar Cup WFL Oral - Nectar Straw WFL Oral - Thin Teaspoon WFL Oral - Thin Cup -- Oral - Thin Straw -- Oral - Puree Lingual/palatal residue;Reduced posterior propulsion;Decreased bolus cohesion;Holding of bolus Oral - Mech Soft -- Oral - Regular Premature spillage;Reduced posterior propulsion;Other (Comment);Decreased bolus cohesion;Lingual/palatal residue;Holding of bolus Oral - Multi-Consistency -- Oral - Pill Holding of bolus;Weak  lingual manipulation;Lingual/palatal residue Oral Phase - Comment --  CHL IP PHARYNGEAL PHASE 10/20/2017 Pharyngeal Phase Impaired Pharyngeal- Pudding Teaspoon -- Pharyngeal -- Pharyngeal- Pudding Cup -- Pharyngeal -- Pharyngeal- Honey Teaspoon -- Pharyngeal -- Pharyngeal- Honey Cup -- Pharyngeal -- Pharyngeal- Nectar Teaspoon Reduced airway/laryngeal closure;Reduced epiglottic inversion;Reduced anterior laryngeal mobility;Reduced laryngeal elevation Pharyngeal --  Pharyngeal- Nectar Cup Reduced epiglottic inversion;Reduced airway/laryngeal closure;Reduced laryngeal elevation;Reduced anterior laryngeal mobility Pharyngeal -- Pharyngeal- Nectar Straw Reduced airway/laryngeal closure;Reduced epiglottic inversion;Reduced anterior laryngeal mobility;Reduced laryngeal elevation Pharyngeal -- Pharyngeal- Thin Teaspoon Penetration/Aspiration during swallow;Reduced anterior laryngeal mobility;Reduced laryngeal elevation;Reduced airway/laryngeal closure;Reduced epiglottic inversion;Compensatory strategies attempted (with notebox) Pharyngeal Material enters airway, passes BELOW cords without attempt by patient to eject out (silent aspiration) Pharyngeal- Thin Cup -- Pharyngeal -- Pharyngeal- Thin Straw -- Pharyngeal -- Pharyngeal- Puree Reduced epiglottic inversion;Reduced anterior laryngeal mobility;Reduced laryngeal elevation;Reduced airway/laryngeal closure Pharyngeal -- Pharyngeal- Mechanical Soft -- Pharyngeal -- Pharyngeal- Regular Reduced epiglottic inversion;Reduced anterior laryngeal mobility;Reduced laryngeal elevation;Reduced airway/laryngeal closure Pharyngeal -- Pharyngeal- Multi-consistency -- Pharyngeal -- Pharyngeal- Pill Reduced anterior laryngeal mobility;Reduced laryngeal elevation;Reduced epiglottic inversion;Reduced airway/laryngeal closure Pharyngeal -- Pharyngeal Comment --  CHL IP CERVICAL ESOPHAGEAL PHASE 10/20/2017 Cervical Esophageal Phase WFL Pudding Teaspoon -- Pudding Cup -- Honey Teaspoon -- Honey Cup -- Nectar Teaspoon -- Nectar Cup -- Nectar Straw -- Thin Teaspoon -- Thin Cup -- Thin Straw -- Puree -- Mechanical Soft -- Regular -- Multi-consistency -- Pill -- Cervical Esophageal Comment -- No flowsheet data found. Paiewonsky, Laura 10/20/2017, 3:12 PM Note populated for Jordan Jarrett, Student SLP Laura Paiewonsky, M.A. CCC-SLP [phone]                Medications:  Scheduled: . arformoterol  15 mcg Nebulization BID  . aspirin  81 mg Per Tube  Daily  . budesonide (PULMICORT) nebulizer solution  0.5 mg Nebulization BID  . chlorhexidine gluconate (MEDLINE KIT)  15 mL Mouth Rinse BID  . Chlorhexidine Gluconate Cloth  6 each Topical Daily  . diltiazem  30 mg Per Tube Q8H  . famotidine  20 mg Per Tube BID  . free water  100 mL Per Tube Q8H  . heparin  5,000 Units Subcutaneous Q8H  . insulin aspart  0-15 Units Subcutaneous Q4H  . mouth rinse  15 mL Mouth Rinse QID  . nicotine  21 mg Transdermal Daily  . predniSONE  20 mg Oral Q breakfast   Followed by  . [START ON 10/26/2017] predniSONE  10 mg Oral Q breakfast   Followed by  . [START ON 10/31/2017] predniSONE  5 mg Oral Q breakfast   Continuous: . feeding supplement (VITAL AF 1.2 CAL) 1,000 mL (10/20/17 2048)   PRN:acetaminophen (TYLENOL) oral liquid 160 mg/5 mL, gi cocktail, guaiFENesin, hydrALAZINE, HYDROcodone-acetaminophen, LORazepam, morphine injection, ondansetron (ZOFRAN) IV, RESOURCE THICKENUP CLEAR, sennosides, sodium chloride flush  Assessment/Plan:  Active Problems:   Acute respiratory failure with hypoxemia (HCC)   Acute on chronic respiratory failure (HCC)   Influenza A   Acute respiratory failure (HCC)   Pneumothorax, traumatic   Tracheostomy status (HCC)   Hypoxemia    Acute on chronic hypoxic respiratory failure Patient has been stable.  Pulmonology is following.  Patient remains on trach collar.  Speech therapy is following.  Symptoms have improved.  Remains somewhat anxious.    Dysphagia Felt to be due to generalized weakness from acute illness and tracheostomy.  Speech therapy is following.  Currently on tube feedings.  She is approved for dysphagia 2 diet.  Influenza A She has completed course   of treatment.  She is asymptomatic.  History of COPD with acute exacerbation Pulmonology is following.  She has stabilized.  Steroids are being slowly weaned.  Elevated WBC most likely due to steroids.  Labs are stable.  WBC is better.  Pneumothorax,  right-sided, iatrogenic during NG tube placement She had a chest tube placed by pulmonology.  Removed on 1/18.  Stable.  Right lower lobe infiltrate Treated with 5 days of empiric Zosyn.  Appears to be stable currently.  Hypokalemia Will be repleted  History of GERD Pepcid  DVT Prophylaxis: Subcutaneous heparin    Code Status: Full code Family Communication: Discussed with the patient Disposition Plan: Management as outlined above.  Disposition is not entirely clear at this time.  Social worker is following.    LOS: 25 days      [facility] Pager [phone] 10/21/2017, 11:12 AM  If 7PM-7AM, please contact night-coverage at [email or web address], password [email or web address]   

## 2017-10-21 NOTE — Progress Notes (Signed)
Cortrak paged due to NG occlusion. Will continue to monitor and intervene PRN

## 2017-10-22 LAB — GLUCOSE, CAPILLARY
GLUCOSE-CAPILLARY: 83 mg/dL (ref 65–99)
GLUCOSE-CAPILLARY: 171 mg/dL — AB (ref 65–99)
GLUCOSE-CAPILLARY: 100 mg/dL — AB (ref 65–99)
Glucose-Capillary: 88 mg/dL (ref 65–99)
Glucose-Capillary: 120 mg/dL — ABNORMAL HIGH (ref 65–99)

## 2017-10-22 LAB — BASIC METABOLIC PANEL
Anion gap: 14 (ref 5–15)
BUN: 14 mg/dL (ref 6–20)
CALCIUM: 8.8 mg/dL — AB (ref 8.9–10.3)
CO2: 23 mmol/L (ref 22–32)
Chloride: 100 mmol/L — ABNORMAL LOW (ref 101–111)
Creatinine, Ser: 0.45 mg/dL (ref 0.44–1.00)
GFR calc Af Amer: >60 mL/min (ref >60)
GLUCOSE: 89 mg/dL (ref 65–99)
Potassium: 3.7 mmol/L (ref 3.5–5.1)
Sodium: 137 mmol/L (ref 135–145)

## 2017-10-22 LAB — MAGNESIUM: Magnesium: 2.1 mg/dL (ref 1.7–2.4)

## 2017-10-22 NOTE — Progress Notes (Signed)
[facility] PROGRESS NOTE  Sally [name] XYV:[id number] DOB: [birth year] DOA: 09/26/2017  PCP: [name], MD  Brief History/Interval Summary: 67 year old female with a history of severe COPD tobacco abuse presented with shortness of breath.  Had to be intubated for respiratory failure.  Subsequently underwent tracheostomy.  Also developed pneumothorax requiring chest tube.  Subsequently transferred to stepdown unit.   Reason for Visit: Acute on chronic hypoxic respiratory failure  Consultants: Pulmonology  Procedures: Tracheostomy.  Chest tube placement for pneumothorax.  Antibiotics: Zosyn 1/16 > 1/21 Ceftriaxone 1/5 > 1/9 Tamiflu 1/2 > 1/6   Subjective/Interval History: Patient continues to feel better.  Still has some anxiety and suffocating feeling when she is trying to eat or drink.  Pain has improved.  Cough is improved.    ROS: Denies any nausea or vomiting  Objective:  Vital Signs  Vitals:   10/22/17 0336 10/22/17 0700 10/22/17 0744 10/22/17 0748  BP: (!) 146/81  (!) 141/65   Pulse: 80  84   Resp: 16  17   Temp: 97.6 F (36.4 C) 98.2 F (36.8 C)    TempSrc: Oral Oral    SpO2: 100%  99% 99%  Weight: 71.7 kg (158 lb 1.1 oz)     Height:        Intake/Output Summary (Last 24 hours) at 10/22/2017 1017 Last data filed at 10/22/2017 0500 Gross per 24 hour  Intake 740 ml  Output 0 ml  Net 740 ml   Filed Weights   10/20/17 0324 10/21/17 0238 10/22/17 0336  Weight: 70.5 kg (155 lb 6.8 oz) 70.1 kg (154 lb 8.7 oz) 71.7 kg (158 lb 1.1 oz)    General appearance: Awake alert.  In no distress Resp: Improved aeration bilaterally.  No wheezing rales or rhonchi. Cardio: S1-S2 is normal regular no S3-S4.  No rubs murmurs or bruit GI: Abdomen remains soft.  Nontender nondistended.  Bowel sounds are present.  No masses organomegaly. Extremities: No edema noted Neurologic: No obvious focal neurological deficits.  Generalized weakness is present.  Lab  Results:  Data Reviewed: I have personally reviewed following labs and imaging studies  CBC: Recent Labs  Lab 10/17/17 0312 10/21/17 0437  WBC 22.2* 13.9*  HGB 10.7* 10.9*  HCT 32.8* 33.3*  MCV 93.4 96.0  PLT 335 286    Basic Metabolic Panel: Recent Labs  Lab 10/17/17 0312 10/18/17 0710 10/21/17 0437 10/22/17 0336  NA 140 137 137 137  K 3.3* 4.0 3.1* 3.7  CL 106 102 100* 100*  CO2 21* _0 GLUCOSE 110* 115* 92 89  BUN 25* 22* 19 14  CREATININE 0.44 0.45 0.43* 0.45  CALCIUM 8.5* 8.4* 8.9 8.8*  MG  --  2.1  --  2.1    GFR: Estimated Creatinine Clearance: 63.5 mL/min (by C-G formula based on SCr of 0.45 mg/dL).  Liver Function Tests: Recent Labs  Lab 10/17/17 0312  AST 38  ALT 112*  ALKPHOS 96  BILITOT 0.7  PROT 5.8*  ALBUMIN 2.7*    CBG: Recent Labs  Lab 10/21/17 1547 10/21/17 2006 10/21/17 2340 10/22/17 0335 10/22/17 0716  GLUCAP 111* 93 83 88 83     Radiology Studies: Dg Swallowing Func-speech Pathology  Result Date: 10/20/2017 Objective Swallowing Evaluation: Type of Study: MBS-Modified Barium Swallow Study  Patient Details Name: Sally [name] MRN: [id number] Date of Birth: 09-26-1951 Today's Date: 10/20/2017 Time: SLP Start Time (ACUTE ONLY): 7903 -SLP Stop Time (ACUTE ONLY): 1336 SLP Time Calculation (min) (ACUTE  ONLY): 23 min Past Medical History: Past Medical History: Diagnosis Date . Asthma  . Back pain  . COPD (chronic obstructive pulmonary disease) ([place])  . Crohn disease ([place])  . Hypertension  Past Surgical History: Past Surgical History: Procedure Laterality Date . ABDOMINAL HYSTERECTOMY   . CATARACT EXTRACTION, BILATERAL   . FRACTURE SURGERY Left   ARM HPI: 67 year old female with a past medical history significant for severe COPDcame to the emergency department on 09/26/2017 complaining of shortness of breath. Intubated on arrival. Two failed extubations: 1/8 and 1/11. Trach placed 1/15.  Subjective: Pt anxious about MBS, but cooperative to  commands Assessment / Plan / Recommendation CHL IP CLINICAL IMPRESSIONS 10/20/2017 Clinical Impression Pt presents with a moderate pharyngeal dysphagia indicated by reduced hylolaryngeal excursion, airway/laryngeal closure, and eppiglottic inversion leading to penetration and aspiration of thin liquids. Pt was tested with and without PMV in place. With PMV in place while also using chin tuck strategy, she had no more aspiration, but continued to penetrate. Pt was able to reduce penetration with max cues to throat clear/cough and complete subsequent swallows. Pt appears to have osteophytes at the level of the epiglottis, contributing to the reduced epiglottic inversion during swallow. Pt protected airway for all other consistencies observed. For all solids pt had increased oral holding, bolus manipulation/formation time, and lingual residue, but oral characteristics did not appear to impact swallow safety. Recommend initiation of Dysphagia 2 (chopped) solids and nectar thick liquids with medications administered in puree. Follow aspiration precautions: PMV in place for meals, small/slow bites, intermittent throat clear/cough, multiple swallows (especially with solids), and limiting distractions to lessen anxiety. Pt's dyspahgia is likley to improve with continued healing/time from intubation with hopeful progression to thin liquids. SLP will continue to follow to determine safety with diet and plan for possible advancement.   SLP Visit Diagnosis Dysphagia, oropharyngeal phase (R13.12) Attention and concentration deficit following -- Frontal lobe and executive function deficit following -- Impact on safety and function Moderate aspiration risk   CHL IP TREATMENT RECOMMENDATION 10/20/2017 Treatment Recommendations Therapy as outlined in treatment plan below   Prognosis 10/20/2017 Prognosis for Safe Diet Advancement Good Barriers to Reach Goals Behavior Barriers/Prognosis Comment -- CHL IP DIET RECOMMENDATION 10/20/2017  SLP Diet Recommendations Dysphagia 2 (Fine chop) solids;Nectar thick liquid Liquid Administration via Straw;Cup Medication Administration Whole meds with puree Compensations Multiple dry swallows after each bite/sip;Small sips/bites;Slow rate;Minimize environmental distractions Postural Changes Seated upright at 90 degrees   CHL IP OTHER RECOMMENDATIONS 10/20/2017 Recommended Consults -- Oral Care Recommendations Oral care BID Other Recommendations Order thickener from pharmacy;Prohibited food (jello, ice cream, thin soups);Remove water pitcher;Place PMSV during PO intake   CHL IP FOLLOW UP RECOMMENDATIONS 10/20/2017 Follow up Recommendations Skilled Nursing facility   [facility] IP FREQUENCY AND DURATION 10/20/2017 Speech Therapy Frequency (ACUTE ONLY) min 2x/week Treatment Duration 2 weeks      CHL IP ORAL PHASE 10/20/2017 Oral Phase Impaired Oral - Pudding Teaspoon -- Oral - Pudding Cup -- Oral - Honey Teaspoon -- Oral - Honey Cup -- Oral - Nectar Teaspoon WFL Oral - Nectar Cup WFL Oral - Nectar Straw WFL Oral - Thin Teaspoon WFL Oral - Thin Cup -- Oral - Thin Straw -- Oral - Puree Lingual/palatal residue;Reduced posterior propulsion;Decreased bolus cohesion;Holding of bolus Oral - Mech Soft -- Oral - Regular Premature spillage;Reduced posterior propulsion;Other (Comment);Decreased bolus cohesion;Lingual/palatal residue;Holding of bolus Oral - Multi-Consistency -- Oral - Pill Holding of bolus;Weak lingual manipulation;Lingual/palatal residue Oral Phase - Comment --  CHL IP  PHARYNGEAL PHASE 10/20/2017 Pharyngeal Phase Impaired Pharyngeal- Pudding Teaspoon -- Pharyngeal -- Pharyngeal- Pudding Cup -- Pharyngeal -- Pharyngeal- Honey Teaspoon -- Pharyngeal -- Pharyngeal- Honey Cup -- Pharyngeal -- Pharyngeal- Nectar Teaspoon Reduced airway/laryngeal closure;Reduced epiglottic inversion;Reduced anterior laryngeal mobility;Reduced laryngeal elevation Pharyngeal -- Pharyngeal- Nectar Cup Reduced epiglottic inversion;Reduced  airway/laryngeal closure;Reduced laryngeal elevation;Reduced anterior laryngeal mobility Pharyngeal -- Pharyngeal- Nectar Straw Reduced airway/laryngeal closure;Reduced epiglottic inversion;Reduced anterior laryngeal mobility;Reduced laryngeal elevation Pharyngeal -- Pharyngeal- Thin Teaspoon Penetration/Aspiration during swallow;Reduced anterior laryngeal mobility;Reduced laryngeal elevation;Reduced airway/laryngeal closure;Reduced epiglottic inversion;Compensatory strategies attempted (with notebox) Pharyngeal Material enters airway, passes BELOW cords without attempt by patient to eject out (silent aspiration) Pharyngeal- Thin Cup -- Pharyngeal -- Pharyngeal- Thin Straw -- Pharyngeal -- Pharyngeal- Puree Reduced epiglottic inversion;Reduced anterior laryngeal mobility;Reduced laryngeal elevation;Reduced airway/laryngeal closure Pharyngeal -- Pharyngeal- Mechanical Soft -- Pharyngeal -- Pharyngeal- Regular Reduced epiglottic inversion;Reduced anterior laryngeal mobility;Reduced laryngeal elevation;Reduced airway/laryngeal closure Pharyngeal -- Pharyngeal- Multi-consistency -- Pharyngeal -- Pharyngeal- Pill Reduced anterior laryngeal mobility;Reduced laryngeal elevation;Reduced epiglottic inversion;Reduced airway/laryngeal closure Pharyngeal -- Pharyngeal Comment --  CHL IP CERVICAL ESOPHAGEAL PHASE 10/20/2017 Cervical Esophageal Phase WFL Pudding Teaspoon -- Pudding Cup -- Honey Teaspoon -- Honey Cup -- Nectar Teaspoon -- Nectar Cup -- Nectar Straw -- Thin Teaspoon -- Thin Cup -- Thin Straw -- Puree -- Mechanical Soft -- Regular -- Multi-consistency -- Pill -- Cervical Esophageal Comment -- No flowsheet data found. Germain Osgood 10/20/2017, 3:12 PM Note populated for [place] Jarrett, Student SLP Germain Osgood, M.A. CCC-SLP [phone]                Medications:  Scheduled: . arformoterol  15 mcg Nebulization BID  . aspirin  81 mg Per Tube Daily  . budesonide (PULMICORT) nebulizer solution  0.5 mg  Nebulization BID  . chlorhexidine gluconate (MEDLINE KIT)  15 mL Mouth Rinse BID  . Chlorhexidine Gluconate Cloth  6 each Topical Daily  . diltiazem  30 mg Per Tube Q8H  . famotidine  20 mg Per Tube BID  . free water  100 mL Per Tube Q8H  . heparin  5,000 Units Subcutaneous Q8H  . insulin aspart  0-15 Units Subcutaneous Q4H  . mouth rinse  15 mL Mouth Rinse QID  . nicotine  21 mg Transdermal Daily  . predniSONE  20 mg Oral Q breakfast   Followed by  . [START ON 10/26/2017] predniSONE  10 mg Oral Q breakfast   Followed by  . [START ON 10/31/2017] predniSONE  5 mg Oral Q breakfast   Continuous: . feeding supplement (VITAL AF 1.2 CAL) 1,000 mL (10/21/17 1600)   XHB:ZJIRCVELFYBOF (TYLENOL) oral liquid 160 mg/5 mL, gi cocktail, guaiFENesin, hydrALAZINE, HYDROcodone-acetaminophen, LORazepam, morphine injection, ondansetron (ZOFRAN) IV, RESOURCE THICKENUP CLEAR, sennosides, sodium chloride flush  Assessment/Plan:  Active Problems:   Acute respiratory failure with hypoxemia (HCC)   Acute on chronic respiratory failure (HCC)   Influenza A   Acute respiratory failure (HCC)   Pneumothorax, traumatic   Tracheostomy status (HCC)   Hypoxemia    Acute on chronic hypoxic respiratory failure Vision has significantly improved.  She remains on trach collar.  Pulmonology has been following.  Plan is to downgrade her tracheostomy tomorrow.  She remains off of ventilatory support.     Dysphagia Felt to be due to generalized weakness from acute illness and tracheostomy.  Speech therapy is following.  She was approved for oral intake.  Yesterday her feeding tube got clogged.  It was removed.  Seems to be tolerating her oral diet.  She is approved for dysphagia 2 diet.  Influenza A She has completed course of treatment.  She is asymptomatic.  History of COPD with acute exacerbation Pulmonology is following.  She has stabilized.  Steroids are being slowly weaned.   Pneumothorax, right-sided,  iatrogenic during NG tube placement She had a chest tube placed by pulmonology.  Removed on 1/18.  Stable.  Right lower lobe infiltrate Treated with 5 days of empiric Zosyn.  Appears to be stable currently.  Hypokalemia Normal this morning.  Magnesium is normal as well.  History of GERD Pepcid  DVT Prophylaxis: Subcutaneous heparin    Code Status: Full code Family Communication: Discussed with the patient Disposition Plan: Regimen as outlined above.  Disposition is not entirely clear at this time.     LOS: 26 days   [facility] Pager [phone] 10/22/2017, 10:17 AM  If 7PM-7AM, please contact night-coverage at [email or web address], password [facility]

## 2017-10-22 NOTE — Progress Notes (Signed)
Patient suctioned via tracheal catheter and new dressing applied. Supervised patient while eating and wearing PMSV. Patient able to tolerate PMSV for approximately 10 minutes before becoming anxious and short of breath. Respirations at beginning 21, at the end had increased to 27.

## 2017-10-23 LAB — GLUCOSE, CAPILLARY
GLUCOSE-CAPILLARY: 158 mg/dL — AB (ref 65–99)
GLUCOSE-CAPILLARY: 92 mg/dL (ref 65–99)
Glucose-Capillary: 87 mg/dL (ref 65–99)
Glucose-Capillary: 100 mg/dL — ABNORMAL HIGH (ref 65–99)
Glucose-Capillary: 123 mg/dL — ABNORMAL HIGH (ref 65–99)
Glucose-Capillary: 118 mg/dL — ABNORMAL HIGH (ref 65–99)
Glucose-Capillary: 140 mg/dL — ABNORMAL HIGH (ref 65–99)
Glucose-Capillary: 107 mg/dL — ABNORMAL HIGH (ref 65–99)

## 2017-10-23 MED ORDER — LORAZEPAM 0.5 MG PO TABS
0.5000 mg | ORAL_TABLET | ORAL | Status: DC | PRN
  Administered 2017-10-23 – 2017-10-27 (×5): 0.5 mg via ORAL
  Filled 2017-10-23 (×5): qty 1

## 2017-10-23 MED ORDER — ACETAMINOPHEN 325 MG PO TABS
650.0000 mg | ORAL_TABLET | Freq: Four times a day (QID) | ORAL | Status: DC | PRN
  Administered 2017-10-23: 650 mg via ORAL
  Filled 2017-10-23: qty 2

## 2017-10-23 MED ORDER — FAMOTIDINE 20 MG PO TABS
20.0000 mg | ORAL_TABLET | Freq: Two times a day (BID) | ORAL | Status: DC
  Administered 2017-10-23 – 2017-10-27 (×9): 20 mg via ORAL
  Filled 2017-10-23 (×9): qty 1

## 2017-10-23 MED ORDER — ASPIRIN 81 MG PO CHEW
81.0000 mg | CHEWABLE_TABLET | Freq: Every day | ORAL | Status: DC
  Administered 2017-10-23 – 2017-10-27 (×5): 81 mg via ORAL
  Filled 2017-10-23 (×5): qty 1

## 2017-10-23 MED ORDER — SENNA 8.6 MG PO TABS: 1.0000 | ORAL_TABLET | Freq: Every day | ORAL | Status: DC | PRN

## 2017-10-23 MED ORDER — DILTIAZEM HCL 30 MG PO TABS
30.0000 mg | ORAL_TABLET | Freq: Three times a day (TID) | ORAL | Status: DC
  Administered 2017-10-23 – 2017-10-27 (×12): 30 mg via ORAL
  Filled 2017-10-23 (×12): qty 1

## 2017-10-23 MED ORDER — HYDROCODONE-ACETAMINOPHEN 7.5-325 MG PO TABS
1.0000 | ORAL_TABLET | Freq: Four times a day (QID) | ORAL | Status: DC | PRN
  Administered 2017-10-24 – 2017-10-26 (×4): 1 via ORAL
  Filled 2017-10-23 (×4): qty 1

## 2017-10-23 NOTE — Procedures (Addendum)
Tracheostomy Change Note  Patient Details:   Name: Lalania Tolar DOB: 04-03-1951 MRN: [id number]    Airway Documentation:  Trach tube changed, downsize. #4 shiley uncuffed placed with obturator without difficulty. Minimal bleeding. Positive color change on co2 detector. BBS auscultated.   Evaluation  O2 sats: stable throughout Complications: No apparent complications Patient did tolerate procedure well. Bilateral Breath Sounds: Diminished, Clear    Suszanne Conners 10/23/2017, 3:22 PM

## 2017-10-23 NOTE — Plan of Care (Signed)
Patient is progressing well. She is more tolerable to moving and rotating in the bed and into the chair. She is asks questions about her care and is in engaged in learning. Will continue to assess and educate.

## 2017-10-23 NOTE — Progress Notes (Addendum)
Physical Therapy Treatment Patient Details Name: Sally [name] MRN: [id number] DOB: 06-Oct-1950 Today's Date: 10/23/2017    History of Present Illness Pt is a 67 y/o female admitted secondary to a COPD exacerbation and +Flu. Pt required intubation    PT Comments    Pt admitted with above diagnosis. Pt currently with functional limitations due to balance and endurance deficits. Pt was able to transfer to chair with use of Stedy and +2 max assist.  Pt having more difficulty standing today only able to stand 3 seconds with max assist of 2.  Pt fearful of falling and states her LEs are "numb".  Pt works hard and did exercises well at end of session.  Continue to progress as pt able.  Pt will benefit from skilled PT to increase their independence and safety with mobility to allow discharge to the venue listed below.     Follow Up Recommendations  SNF;Supervision/Assistance - 24 hour     Equipment Recommendations  None recommended by PT    Recommendations for Other Services       Precautions / Restrictions Precautions Precautions: Fall;Other (comment) Precaution Comments: trach collar, rectal tube, NG tube Restrictions Weight Bearing Restrictions: No    Mobility  Bed Mobility Overal bed mobility: Needs Assistance Bed Mobility: Supine to Sit Rolling: +2 for physical assistance;Mod assist Sidelying to sit: +2 for physical assistance;Mod assist Supine to sit: +2 for physical assistance;Mod assist     General bed mobility comments: increased time and effort. Needed assist for right LE to eOB and elevation of trunk.   Transfers Overall transfer level: Needs assistance Equipment used: Ambulation equipment used Transfers: Sit to/from Stand Sit to Stand: Max assist;+2 physical assistance;Total assist         General transfer comment: increased time and effort, pt with grasping Stedyfor support. Was able to complete stand enough to get buttock pads under pt. Pt unable to achieve  full erect standing position, maintaining a semi- flexed posture. Pt performed sit<>stand from EOB and then from [facility]. Had more difficulty today as pt states her legs are numb. Ensured her that Antony Salmon is safe but pt still not trusting the [place].  She only stood for 3 seconds each time.  Limited also by respiratory status and incr HR today.  Had to incr trach collar from 28% to 40% to keep sats up above 90% when working with pt.  Pt back to 28% with Sats >90% at end of treatment. Pt HR 113-131 bpm with acitivity.   Ambulation/Gait                 Stairs            Wheelchair Mobility    Modified Rankin (Stroke Patients Only)       Balance Overall balance assessment: Needs assistance Sitting-balance support: Bilateral upper extremity supported;Feet supported Sitting balance-Leahy Scale: Poor Sitting balance - Comments: min guard maintain sitting balance x 5 minutes Postural control: Posterior lean Standing balance support: During functional activity;Bilateral upper extremity supported Standing balance-Leahy Scale: Poor Standing balance comment: Had to have bil UE support and max assist to come to standing. Could not sustain standind more than 3 seconds                             Cognition Arousal/Alertness: Awake/alert Behavior During Therapy: WFL for tasks assessed/performed Overall Cognitive Status: Within Functional Limits for tasks assessed  General Comments: pt writing questions       Exercises General Exercises - Lower Extremity Ankle Circles/Pumps: AAROM;Both;10 reps;Seated Long Arc Quad: AROM;Right;Left;Seated;10 reps Hip ABduction/ADduction: Right;Left;AROM;10 reps;Seated Hip Flexion/Marching: AROM;Both;10 reps;Seated    General Comments        Pertinent Vitals/Pain Pain Assessment: Faces Faces Pain Scale: Hurts little more Pain Location: when transitioning to bed from chair Pain Descriptors /  Indicators: Grimacing Pain Intervention(s): Limited activity within patient's tolerance;Monitored during session;Premedicated before session;Repositioned    Home Living                      Prior Function            PT Goals (current goals can now be found in the care plan section) Acute Rehab PT Goals Patient Stated Goal: to breathe and walk Progress towards PT goals: Progressing toward goals    Frequency    Min 2X/week      PT Plan Current plan remains appropriate    Co-evaluation              AM-PAC PT "6 Clicks" Daily Activity  Outcome Measure  Difficulty turning over in bed (including adjusting bedclothes, sheets and blankets)?: Unable Difficulty moving from lying on back to sitting on the side of the bed? : Unable Difficulty sitting down on and standing up from a chair with arms (e.g., wheelchair, bedside commode, etc,.)?: Unable Help needed moving to and from a bed to chair (including a wheelchair)?: Total Help needed walking in hospital room?: Total Help needed climbing 3-5 steps with a railing? : Total 6 Click Score: 6    End of Session Equipment Utilized During Treatment: Gait belt;Oxygen;Other (comment)(trach collar) Activity Tolerance: Patient limited by fatigue Patient left: with call bell/phone within reach;in chair;with chair alarm set Nurse Communication: Mobility status;Need for lift equipment PT Visit Diagnosis: Other abnormalities of gait and mobility (R26.89);Muscle weakness (generalized) (M62.81)     Time: 9417-4081 PT Time Calculation (min) (ACUTE ONLY): 38 min  Charges:  $Therapeutic Exercise: 8-22 mins $Therapeutic Activity: 23-37 mins                    G Codes:       Garvin Ellena,PT Acute Rehabilitation [phone] (pager)    Berline Lopes 10/23/2017, 12:29 PM

## 2017-10-23 NOTE — Progress Notes (Signed)
  Speech Language Pathology Treatment: Dysphagia;Passy Muir Speaking valve  Patient Details Name: Sally [name] MRN: [id number] DOB: 16-May-1951 Today's Date: 10/23/2017 Time: 6270-3500 SLP Time Calculation (min) (ACUTE ONLY): 29 min  Assessment / Plan / Recommendation Clinical Impression  Pt presents with improved vocal quality, ability to tolerate PMV for 25 min, and apparent tolerance of diet given clinical picture and no signs of aspiration with PO trials. Pt requiring min verbal cues reinforcing PMV and aspiration precautions. Pt reports no difficulty with diet, but her anxiety persists when wearing PMV. However, pt's vitals were stable and pt appeared more comfortable during this trial. Pt now with a smaller cuff which will likely lessen obstruction to upper airway and aid in increased tolerance of PMV. Continue to recommend Dysphagia 2 and Nectar thick liquids. PMV to be worn intermittently with full supervision and throughout meals. SLP will continue to follow to determine swallow safety with diet. As voice continues to strengthen and swallowing musculature reconditions, expect repeat MBS to advance diet.    HPI HPI: 67 year old female with a past medical history significant for severe COPDcame to the emergency department on 09/26/2017 complaining of shortness of breath. Intubated on arrival. Two failed extubations: 1/8 and 1/11. Trach placed 1/15.       SLP Plan  Continue with current plan of care       Recommendations  Diet recommendations: Dysphagia 2 (fine chop);Nectar-thick liquid Liquids provided via: Cup Medication Administration: Crushed with puree Supervision: Patient able to self feed;Full supervision/cueing for compensatory strategies(to monitor PMV) Compensations: Slow rate;Small sips/bites;Minimize environmental distractions;Multiple dry swallows after each bite/sip Postural Changes and/or Swallow Maneuvers: Seated upright 90 degrees      Patient may use Passy-Muir  Speech Valve: Intermittently with supervision;During PO intake/meals PMSV Supervision: Full         Oral Care Recommendations: Oral care BID Follow up Recommendations: Skilled Nursing facility SLP Visit Diagnosis: Dysphagia, oropharyngeal phase (R13.12) Plan: Continue with current plan of care       GO            [place] Jaryan Chicoine SLP Student Clinician    [place] Hutson Luft 10/23/2017, 5:12 PM

## 2017-10-23 NOTE — Progress Notes (Signed)
Occupational Therapy Treatment Patient Details Name: Sally [name] MRN: [id number] DOB: Sep 23, 1951 Today's Date: 10/23/2017    History of present illness Pt is a 67 y/o female admitted secondary to a COPD exacerbation and +Flu. Pt required intubation   OT comments  Pt assisted with rolling for pericare and lifted to bed from chair with maximove. Pt able to long sit in bed with min guard assist. Pt indicating her husband will be able to care for her at home.  Recommending equipment as indicated below, but my recommendation remains SNF for continued rehab. Will continue to follow.  Follow Up Recommendations  SNF;Supervision/Assistance - 24 hour(husband plans to take pt home)    Equipment Recommendations  Wheelchair (measurements OT);Wheelchair cushion (measurements OT);Hospital bed(hoyer lift)    Recommendations for Other Services      Precautions / Restrictions Precautions Precautions: Fall;Other (comment) Precaution Comments: trach collar       Mobility Bed Mobility Overal bed mobility: Needs Assistance Bed Mobility: Sit to Supine       Sit to supine: Min assist   General bed mobility comments: from long sit after lifted to bed from chair with maximove  Transfers Overall transfer level: Needs assistance               General transfer comment: total assist due to fatigue to return to bed from chair, pt will need hoyer if going home     Balance     Sitting balance-Leahy Scale: Fair Sitting balance - Comments: in long sitting                                   ADL either performed or assessed with clinical judgement   ADL Overall ADL's : Needs assistance/impaired                             Toileting- Clothing Manipulation and Hygiene: Total assistance;+2 for physical assistance;Bed level               Vision       Perception     Praxis      Cognition Arousal/Alertness: Awake/alert Behavior During Therapy: WFL for  tasks assessed/performed Overall Cognitive Status: Within Functional Limits for tasks assessed                                 General Comments: pt writing to communicate, reports her husband can handle her at home        Exercises     Shoulder Instructions       General Comments      Pertinent Vitals/ Pain       Pain Assessment: Faces Faces Pain Scale: Hurts even more Pain Location: buttocks Pain Descriptors / Indicators: Grimacing Pain Intervention(s): Monitored during session;Repositioned  Home Living                                          Prior Functioning/Environment              Frequency  Min 2X/week        Progress Toward Goals  OT Goals(current goals can now be found in the care plan section)  Progress towards OT goals: Progressing toward goals  Acute Rehab OT Goals Patient Stated Goal: to breathe and walk OT Goal Formulation: With patient Time For Goal Achievement: 10/28/17 Potential to Achieve Goals: Good  Plan Discharge plan remains appropriate    Co-evaluation                 AM-PAC PT "6 Clicks" Daily Activity     Outcome Measure   Help from another person eating meals?: None Help from another person taking care of personal grooming?: A Little Help from another person toileting, which includes using toliet, bedpan, or urinal?: Total Help from another person bathing (including washing, rinsing, drying)?: A Lot Help from another person to put on and taking off regular upper body clothing?: A Lot Help from another person to put on and taking off regular lower body clothing?: Total 6 Click Score: 13    End of Session Equipment Utilized During Treatment: Oxygen  OT Visit Diagnosis: Muscle weakness (generalized) (M62.81);Pain;Unsteadiness on feet (R26.81)   Activity Tolerance Patient limited by fatigue   Patient Left in bed;with nursing/sitter in room   Nurse Communication Need for lift  equipment        Time: [phone] OT Time Calculation (min): 22 min  Charges: OT General Charges $OT Visit: 1 Visit OT Treatments $Therapeutic Activity: 8-22 mins  10/23/2017 Martie Round, OTR/L Pager: [phone]   [place] Plan[place] Bailiff 10/23/2017, 4:06 PM

## 2017-10-23 NOTE — Progress Notes (Signed)
PULMONARY / CRITICAL CARE MEDICINE   Name: Janeece Blok MRN: [id number] DOB: 02/21/1951    ADMISSION DATE:  09/26/2017 CONSULTATION DATE:  1/1  REFERRING MD:  Juleen China (EDP)   CHIEF COMPLAINT:  Dyspnea   HISTORY OF PRESENT ILLNESS:   67 year old female smoker with hx severe COPD presented 1/1 with dyspnea. Failed bipap and was intubated in ER.  Flu A POS.  Prolonged course, difficulty weaning from vent.  Now s/p trach 1/15.    SUBJECTIVE:  SLP approved for modified PO intake. Tolerating 6 cuffed  VITAL SIGNS: BP (!) 117/92   Pulse 96   Temp (!) 97.5 F (36.4 C) (Oral)   Resp 18   Ht 5\' 1"  (1.549 m)   Wt 70.9 kg (156 lb 4.9 oz)   SpO2 98%   BMI 29.53 kg/m   HEMODYNAMICS:    VENTILATOR SETTINGS: FiO2 (%):  [28 %] 28 %  INTAKE / OUTPUT: I/O last 3 completed shifts: In: 480 [P.O.:480] Out: 700 [Urine:700]  PHYSICAL EXAMINATION: General: 67 year old female on trach collar breathing comfortably HEENT: Trach collar in place, #6 cuffed trach in place, no JVD is appreciated Neuro: Moves all extremities x4 no foot drop CV: RRR, no MRG PULM: Clear bilateral breath sounds NF:AOZH, non-tender, bsx4 active  Extremities: No acute deformity Skin: no rashes or lesions. Grossly intact   LABS:  BMET Recent Labs  Lab 10/18/17 0710 10/21/17 0437 10/22/17 0336  NA 137 137 137  K 4.0 3.1* 3.7  CL 102 100* 100*  CO2 24 24 23   BUN 22* 19 14  CREATININE 0.45 0.43* 0.45  GLUCOSE 115* 92 89    Electrolytes Recent Labs  Lab 10/18/17 0710 10/21/17 0437 10/22/17 0336  CALCIUM 8.4* 8.9 8.8*  MG 2.1  --  2.1    CBC Recent Labs  Lab 10/17/17 0312 10/21/17 0437  WBC 22.2* 13.9*  HGB 10.7* 10.9*  HCT 32.8* 33.3*  PLT 335 383    Coag's No results for input(s): APTT, INR in the last 168 hours.  Sepsis Markers No results for input(s): LATICACIDVEN, PROCALCITON, O2SATVEN in the last 168 hours.  ABG No results for input(s): PHART, PCO2ART, PO2ART in the last 168  hours.  Liver Enzymes Recent Labs  Lab 10/17/17 0312  AST 38  ALT 112*  ALKPHOS 96  BILITOT 0.7  ALBUMIN 2.7*    Cardiac Enzymes No results for input(s): TROPONINI, PROBNP in the last 168 hours.  Glucose Recent Labs  Lab 10/22/17 1153 10/22/17 1614 10/22/17 1935 10/22/17 2351 10/23/17 0354 10/23/17 0734  GLUCAP 120* 171* 100* 92 87 100*    Imaging No results found.   STUDIES:    CULTURES: 1/1 RVP>>> POS flu A  1/1 sputum >>> rare coag neg staph   ANTIBIOTICS:   SIGNIFICANT EVENTS: 10/20/2017 for swallowing eval  LINES/TUBES: ETT 1/1>>>1/15 Trach 1/15>>> Chest tube >>> out 1/18  DISCUSSION: 66yo female with respiratory failure r/t AECOPD, flu A   ASSESSMENT / PLAN:  Acute respiratory failure  AECOPD Iatrogenic ptx - resolved, chest tube out  Flu A  Tobacco abuse  P:   Continue to ATC Will downsize to 4 cuffless. If tolerates by weds we can try to cap.  Pulmonary hygiene  Continue prednisone with slow taper - in place  BD's  SLP following for swallow Will follow up again 1/30, please call if needed sooner.  TRH primary    FAMILY  - Updates: no family at bedside 10/20/2017  Attending Note:  67 year old  female with COPD and associated respiratory failure, failed to extubate and was trached during an admission for COPD exacerbation and pneumonia.  On exam, coarse BS diffusely.  I reviewed CXR myself, trach is in good position.  Discussed with PCCM-NP.  Respiratory failure:  - Monitor for airway protection.  - Maintain off the vent  COPD:  - Bronchodilators as ordered.  Trach status:  - Change to a cuffless 4.  - If tolerated then cap on Wednesday  - If cap is tolerated then decannulate on Friday  Patient seen and examined, agree with above note.  I dictated the care and orders written for this patient under my direction.  Alyson Reedy, MD [phone]

## 2017-10-23 NOTE — Progress Notes (Signed)
[facility] PROGRESS NOTE  Sally [name] HDQ:[id number] DOB: 05-09-1951 DOA: 09/26/2017  PCP: [name], MD  Brief History/Interval Summary: 67 year old female with a history of severe COPD tobacco abuse presented with shortness of breath.  Had to be intubated for respiratory failure.  Subsequently underwent tracheostomy.  Also developed pneumothorax requiring chest tube.  Subsequently transferred to stepdown unit.   Reason for Visit: Acute on chronic hypoxic respiratory failure  Consultants: Pulmonology  Procedures: Tracheostomy.  Chest tube placement for pneumothorax.  Antibiotics: Zosyn 1/16 > 1/21 Ceftriaxone 1/5 > 1/9 Tamiflu 1/2 > 1/6   Subjective/Interval History: Patient continues to feel well.  Still not certain about where she would like to go when she is discharged from the hospital.  Still leaning towards going home.  Pain is improved.  Cough is improving.  She has still gets anxious when she eats and drinks.    ROS: Denies any nausea or vomiting  Objective:  Vital Signs  Vitals:   10/23/17 0358 10/23/17 0700 10/23/17 0758 10/23/17 0800  BP: 125/60 (!) 134/50 (!) 134/50   Pulse: 83 84 91   Resp: 18 19 (!) 23   Temp: 98.1 F (36.7 C) (!) 97.5 F (36.4 C)    TempSrc: Oral Oral    SpO2: 98% 99% 100% 100%  Weight: 70.9 kg (156 lb 4.9 oz)     Height:        Intake/Output Summary (Last 24 hours) at 10/23/2017 0815 Last data filed at 10/22/2017 2232 Gross per 24 hour  Intake 240 ml  Output 700 ml  Net -460 ml   Filed Weights   10/21/17 0238 10/22/17 0336 10/23/17 0358  Weight: 70.1 kg (154 lb 8.7 oz) 71.7 kg (158 lb 1.1 oz) 70.9 kg (156 lb 4.9 oz)    General appearance: Awake alert.  In no distress Resp: Improved aeration bilaterally.  No wheezing rales or rhonchi. Cardio: S1-S2 is normal regular.  No S3-S4.  No rubs murmurs or bruit GI: Soft.  Nontender nondistended.   Extremities: No edema noted Neurologic: Improving strength in  bilateral lower extremities which is most likely due to deconditioning.  No obvious focal neurological deficits noted.  Lab Results:  Data Reviewed: I have personally reviewed following labs and imaging studies  CBC: Recent Labs  Lab 10/17/17 0312 10/21/17 0437  WBC 22.2* 13.9*  HGB 10.7* 10.9*  HCT 32.8* 33.3*  MCV 93.4 96.0  PLT 335 119    Basic Metabolic Panel: Recent Labs  Lab 10/17/17 0312 10/18/17 0710 10/21/17 0437 10/22/17 0336  NA 140 137 137 137  K 3.3* 4.0 3.1* 3.7  CL 106 102 100* 100*  CO2 21* 24 24 23   GLUCOSE 110* 115* 92 89  BUN 25* 22* 19 14  CREATININE 0.44 0.45 0.43* 0.45  CALCIUM 8.5* 8.4* 8.9 8.8*  MG  --  2.1  --  2.1    GFR: Estimated Creatinine Clearance: 63.1 mL/min (by C-G formula based on SCr of 0.45 mg/dL).  Liver Function Tests: Recent Labs  Lab 10/17/17 0312  AST 38  ALT 112*  ALKPHOS 96  BILITOT 0.7  PROT 5.8*  ALBUMIN 2.7*    CBG: Recent Labs  Lab 10/22/17 1614 10/22/17 1935 10/22/17 2351 10/23/17 0354 10/23/17 0734  GLUCAP 171* 100* 92 87 100*     Radiology Studies: No results found.   Medications:  Scheduled: . arformoterol  15 mcg Nebulization BID  . aspirin  81 mg Oral Daily  . budesonide (PULMICORT) nebulizer  solution  0.5 mg Nebulization BID  . chlorhexidine gluconate (MEDLINE KIT)  15 mL Mouth Rinse BID  . Chlorhexidine Gluconate Cloth  6 each Topical Daily  . diltiazem  30 mg Oral Q8H  . famotidine  20 mg Oral BID  . free water  100 mL Per Tube Q8H  . heparin  5,000 Units Subcutaneous Q8H  . insulin aspart  0-15 Units Subcutaneous Q4H  . mouth rinse  15 mL Mouth Rinse QID  . nicotine  21 mg Transdermal Daily  . predniSONE  20 mg Oral Q breakfast   Followed by  . [START ON 10/26/2017] predniSONE  10 mg Oral Q breakfast   Followed by  . [START ON 10/31/2017] predniSONE  5 mg Oral Q breakfast   Continuous: . feeding supplement (VITAL AF 1.2 CAL) 1,000 mL (10/21/17 1600)   HDI:XBOERQSXQKSKS, gi  cocktail, guaiFENesin, hydrALAZINE, HYDROcodone-acetaminophen, LORazepam, morphine injection, ondansetron (ZOFRAN) IV, RESOURCE THICKENUP CLEAR, senna, sodium chloride flush  Assessment/Plan:  Active Problems:   Acute respiratory failure with hypoxemia (HCC)   Acute on chronic respiratory failure (HCC)   Influenza A   Acute respiratory failure (HCC)   Pneumothorax, traumatic   Tracheostomy status (HCC)   Hypoxemia    Acute on chronic hypoxic respiratory failure Patient continues to improve.  She remains on trach collar.  Pulmonology has been following.  There is supposed to see the patient today and try to downgrade her trach.  Defer further management to them.  She remains off of ventilatory support.     Dysphagia Felt to be due to generalized weakness from acute illness and tracheostomy.  Speech therapy is following.  She was approved for oral intake.  Feeding tube was removed at her test got clogged.  She is tolerating a diet although experiences anxiety when she uses the PMV.   Influenza A She has completed course of treatment.  She is asymptomatic.  History of COPD with acute exacerbation Pulmonology is following.  She has stabilized.  Steroids are being slowly weaned.   Pneumothorax, right-sided, iatrogenic during NG tube placement She had a chest tube placed by pulmonology.  Removed on 1/18.  Stable.  Right lower lobe infiltrate Treated with 5 days of empiric Zosyn.  Appears to be stable currently.  Hypokalemia Corrected  History of GERD Pepcid  DVT Prophylaxis: Subcutaneous heparin    Code Status: Full code Family Communication: Discussed with the patient Disposition Plan: Management as outlined above.  Will be discharged when cleared by pulmonology.    LOS: 27 days   [place] [facility] Pager [phone] 10/23/2017, 8:15 AM  If 7PM-7AM, please contact night-coverage at [email or web address], password [facility]

## 2017-10-23 NOTE — Care Management Note (Addendum)
Case Management Note  Patient Details  Name: Sally [name] MRN: [id number] Date of Birth: 27-Feb-1951  Subjective/Objective:  From home with spouse, who is able to be with patient 24 hrs a day, he does not work and he states his daughter will be able to assist also.  He would like to take patient home with [facility] services instead of her going to SNF.  NCM gave patient and spouse, [facility]  Agency list to look over.  NCM will check back with them tomorrow.     1/25 1533 Letha Cape RN, BSN- NCM left message with spouse to return call.  Per RN he had decided that he may let her go to SNF after seeing how much work was involved to go home.              1/28 1058 Letha Cape RN, BSN- patient's spouse states he still would like to take patient home, He chose [facility] from the [facility] for [facility], PT, Aide and Social work.  Referral made to [facility] with [facility]. She will also need trach supplies when she goes home.  Per PCCM , trach downsized to 4 cuffless  If tolerates plan to cap by Wed and works towards decannulation by end of week.    2/1 1622 Letha Cape RN, BSN - Patient for possible dc to CIR today per Arvilla Meres , [employer].                    Action/Plan: NCM will follow for dc needs.   Expected Discharge Date:                  Expected Discharge Plan:  Home w Home Health Services(From home with husband)  In-House Referral:  Clinical Social Work  Discharge planning Services  CM Consult  Post Acute Care Choice:  Home Health, Durable Medical Equipment Choice offered to:  Spouse  DME Arranged:  Insurance underwriter supplies DME Agency:  [facility]  HH Arranged:  RN, PT, Nurse's Aide, Social Work [employer] Agency:  [facility]  Status of Service:  In process, will continue to follow  If discussed at Long Length of Stay Meetings, dates discussed:    Additional Comments:  Leone Haven, RN 10/23/2017, 10:58 AM

## 2017-10-23 NOTE — Care Management Important Message (Signed)
Important Message  Patient Details  Name: Sally [name] MRN: [id number] Date of Birth: 1951-03-30   Medicare Important Message Given:  Yes    Leone Haven, RN 10/23/2017, 11:25 AM

## 2017-10-24 LAB — GLUCOSE, CAPILLARY
Glucose-Capillary: 93 mg/dL (ref 65–99)
Glucose-Capillary: 80 mg/dL (ref 65–99)
Glucose-Capillary: 115 mg/dL — ABNORMAL HIGH (ref 65–99)
Glucose-Capillary: 173 mg/dL — ABNORMAL HIGH (ref 65–99)
Glucose-Capillary: 129 mg/dL — ABNORMAL HIGH (ref 65–99)

## 2017-10-24 NOTE — Progress Notes (Signed)
 [facility] PROGRESS NOTE  Tally Huseby MRN:[id number] DOB: 08/04/1951 DOA: 09/26/2017  PCP: Cabeza, Yuri M., MD  Brief History/Interval Summary: 67-year-old female with a history of severe COPD tobacco abuse presented with shortness of breath.  Had to be intubated for respiratory failure.  Subsequently underwent tracheostomy.  Also developed pneumothorax requiring chest tube.  Subsequently transferred to stepdown unit.  Patient has improved over the last many days.  Pulmonology is managing her trach.  Reason for Visit: Acute on chronic hypoxic respiratory failure  Consultants: Pulmonology  Procedures: Tracheostomy.  Chest tube placement for pneumothorax.  Antibiotics: Zosyn 1/16 > 1/21 Ceftriaxone 1/5 > 1/9 Tamiflu 1/2 > 1/6   Subjective/Interval History: Patient states that she is feeling well.  Denies any complaints.  Has tolerated downsizing of tracheostomy tube to cuff less.  Patient denies any nausea vomiting.  Overall she has improved.  Tolerating her diet. Her daughter is at the bedside with multiple questions.   ROS: Denies any nausea or vomiting  Objective:  Vital Signs  Vitals:   10/24/17 0537 10/24/17 0733 10/24/17 0823 10/24/17 0827  BP: 133/64 (!) 161/72  (!) 130/98  Pulse:  93  (!) 102  Resp:  (!) 22  (!) 26  Temp:  97.6 F (36.4 C)    TempSrc:  Oral    SpO2:  97% 99% 99%  Weight:      Height:        Intake/Output Summary (Last 24 hours) at 10/24/2017 1104 Last data filed at 10/24/2017 0939 Gross per 24 hour  Intake 140 ml  Output 650 ml  Net -510 ml   Filed Weights   10/22/17 0336 10/23/17 0358 10/24/17 0401  Weight: 71.7 kg (158 lb 1.1 oz) 70.9 kg (156 lb 4.9 oz) 68.9 kg (151 lb 14.4 oz)    General appearance: Awake alert.  In no distress Resp: Improved aeration bilaterally.  No wheezing rales or rhonchi Cardio: S1-S2 is normal regular.  No S3-S4.  No rubs murmurs or bruit GI: Abdomen is soft.  Nontender nondistended Extremities: No  edema noted Neurologic: Improving strength in bilateral lower extremities which is most likely due to deconditioning.  No obvious focal neurological deficits noted.  Lab Results:  Data Reviewed: I have personally reviewed following labs and imaging studies  CBC: Recent Labs  Lab 10/21/17 0437  WBC 13.9*  HGB 10.9*  HCT 33.3*  MCV 96.0  PLT 383    Basic Metabolic Panel: Recent Labs  Lab 10/18/17 0710 10/21/17 0437 10/22/17 0336  NA 137 137 137  K 4.0 3.1* 3.7  CL 102 100* 100*  CO2 24 24 23  GLUCOSE 115* 92 89  BUN 22* 19 14  CREATININE 0.45 0.43* 0.45  CALCIUM 8.4* 8.9 8.8*  MG 2.1  --  2.1    GFR: Estimated Creatinine Clearance: 62.2 mL/min (by C-G formula based on SCr of 0.45 mg/dL).  CBG: Recent Labs  Lab 10/23/17 1806 10/23/17 1940 10/23/17 2326 10/24/17 0331 10/24/17 0804  GLUCAP 118* 140* 107* 93 80     Radiology Studies: No results found.   Medications:  Scheduled: . arformoterol  15 mcg Nebulization BID  . aspirin  81 mg Oral Daily  . budesonide (PULMICORT) nebulizer solution  0.5 mg Nebulization BID  . chlorhexidine gluconate (MEDLINE KIT)  15 mL Mouth Rinse BID  . Chlorhexidine Gluconate Cloth  6 each Topical Daily  . diltiazem  30 mg Oral Q8H  . famotidine  20 mg Oral BID  . free   water  100 mL Per Tube Q8H  . heparin  5,000 Units Subcutaneous Q8H  . insulin aspart  0-15 Units Subcutaneous Q4H  . mouth rinse  15 mL Mouth Rinse QID  . nicotine  21 mg Transdermal Daily  . predniSONE  20 mg Oral Q breakfast   Followed by  . [START ON 10/26/2017] predniSONE  10 mg Oral Q breakfast   Followed by  . [START ON 10/31/2017] predniSONE  5 mg Oral Q breakfast   Continuous: . feeding supplement (VITAL AF 1.2 CAL) 1,000 mL (10/21/17 1600)   NKN:[id number], gi cocktail, guaiFENesin, hydrALAZINE, HYDROcodone-acetaminophen, LORazepam, morphine injection, ondansetron (ZOFRAN) IV, RESOURCE THICKENUP CLEAR, senna, sodium chloride  flush  Assessment/Plan:  Active Problems:   Acute respiratory failure with hypoxemia (HCC)   Acute on chronic respiratory failure (HCC)   Influenza A   Acute respiratory failure (HCC)   Pneumothorax, traumatic   Tracheostomy status (HCC)   Hypoxemia    Acute on chronic hypoxic respiratory failure Patient continues to improve.  She remains on trach collar.  Pulmonology has been following.  Her tracheostomy tube has been downgraded.  Plan is to cap her tracheostomy tomorrow and then possibly decannulate on Friday.     Dysphagia Felt to be due to generalized weakness from acute illness and tracheostomy.  Speech therapy is following.  Is currently on a dysphagia 2 diet.  Tolerating it well by using PMV.    Influenza A She has completed course of treatment.  She is asymptomatic.  History of COPD with acute exacerbation Pulmonology is following.  She has stabilized.  Steroids are being slowly weaned.   Pneumothorax, right-sided, iatrogenic during NG tube placement She had a chest tube placed by pulmonology.  Removed on 1/18.  Stable.  Right lower lobe infiltrate Treated with 5 days of empiric Zosyn.  Remains stable.  Hypokalemia Corrected.  Recheck labs tomorrow.  History of GERD Pepcid  DVT Prophylaxis: Subcutaneous heparin    Code Status: Full code Family Communication: Discussed with the patient and her daughter. Disposition Plan: Management as outlined above.  Will be discharged when cleared by pulmonology.  Patient and family choosing to go home at this time rather than to skilled nursing facility.  She will need home health when ready for discharge.    LOS: 28 days   [place] [facility] Pager [phone] 10/24/2017, 11:04 AM  If 7PM-7AM, please contact night-coverage at [email or web address], password [facility]

## 2017-10-25 ENCOUNTER — Inpatient Hospital Stay (HOSPITAL_COMMUNITY): Payer: Medicare Other

## 2017-10-25 DIAGNOSIS — Z978 Presence of other specified devices: Secondary | ICD-10-CM

## 2017-10-25 LAB — CBC
HCT: 34.2 % — ABNORMAL LOW (ref 36.0–46.0)
Hemoglobin: 10.9 g/dL — ABNORMAL LOW (ref 12.0–15.0)
MCH: 31.1 pg (ref 26.0–34.0)
MCHC: 31.9 g/dL (ref 30.0–36.0)
MCV: 97.7 fL (ref 78.0–100.0)
PLATELETS: 399 10*3/uL (ref 150–400)
RBC: 3.5 MIL/uL — ABNORMAL LOW (ref 3.87–5.11)
RDW: 17.7 % — AB (ref 11.5–15.5)
WBC: 12.7 10*3/uL — ABNORMAL HIGH (ref 4.0–10.5)

## 2017-10-25 LAB — BASIC METABOLIC PANEL
Anion gap: 10 (ref 5–15)
BUN: 13 mg/dL (ref 6–20)
CALCIUM: 8.8 mg/dL — AB (ref 8.9–10.3)
CO2: 25 mmol/L (ref 22–32)
CREATININE: 0.5 mg/dL (ref 0.44–1.00)
Chloride: 103 mmol/L (ref 101–111)
GFR calc Af Amer: >60 mL/min (ref >60)
Glucose, Bld: 89 mg/dL (ref 65–99)
Potassium: 3.6 mmol/L (ref 3.5–5.1)
SODIUM: 138 mmol/L (ref 135–145)

## 2017-10-25 LAB — GLUCOSE, CAPILLARY
GLUCOSE-CAPILLARY: 141 mg/dL — AB (ref 65–99)
Glucose-Capillary: 131 mg/dL — ABNORMAL HIGH (ref 65–99)
Glucose-Capillary: 143 mg/dL — ABNORMAL HIGH (ref 65–99)
Glucose-Capillary: 74 mg/dL (ref 65–99)
Glucose-Capillary: 91 mg/dL (ref 65–99)

## 2017-10-25 LAB — PHOSPHORUS: PHOSPHORUS: 4.6 mg/dL (ref 2.5–4.6)

## 2017-10-25 MED ORDER — FLUCONAZOLE 150 MG PO TABS
150.0000 mg | ORAL_TABLET | ORAL | Status: DC
  Administered 2017-10-25: 150 mg via ORAL
  Filled 2017-10-25: qty 1

## 2017-10-25 MED ORDER — POTASSIUM CHLORIDE CRYS ER 20 MEQ PO TBCR
40.0000 meq | EXTENDED_RELEASE_TABLET | Freq: Once | ORAL | Status: AC
  Administered 2017-10-25: 40 meq via ORAL
  Filled 2017-10-25: qty 2

## 2017-10-25 NOTE — Progress Notes (Signed)
Modified Barium Swallow Progress Note  Patient Details  Name: Sally [name] MRN: [id number] Date of Birth: 1951/04/24  Today's Date: 10/25/2017  Modified Barium Swallow completed.  Full report located under Chart Review in the Imaging Section.  Brief recommendations include the following:  Clinical Impression     Pt presents with mild oropharyngeal dysphagia, with improved swallow function compared to previous MBS completed (1/25). Pt observed to have difficulty managing mixed consistencies, as indicated by X1 penetration during pill and thin liquid trial. Min verbal cues were used to clear throat and subsequently swallow to reduce penetrates left in laryngeal vestibule. However, pt consumed all other thin liquid trials without airway compromise. Pt's oral phase has improved, with only persistent deficit being slightly increased mastication and bolus formation time, likely consistent with baseline given lack of dentition. Given improved pharyngeal swallow, mild oral deficits, and pt's reports of globus sensation with some solids, recommend Dysphagia 3 (mech soft) and thin liquid diet. Pt requiring intermittent supervision to ensure aspiration precautions. Recommend medications administered whole in puree. SLP will continue to follow to ensure safety with diet and monitor for possible solid texture advancement.   Swallow Evaluation Recommendations       SLP Diet Recommendations: Dysphagia 3 (Mech soft) solids;Thin liquid   Liquid Administration via: Cup;Straw   Medication Administration: Whole meds with puree   Supervision: Intermittent supervision to cue for compensatory strategies   Compensations: Slow rate;Small sips/bites;Minimize environmental distractions   Postural Changes: Seated upright at 90 degrees   Oral Care Recommendations: Oral care BID      Sally [name] SLP Student Clinician   Sally [name] 10/25/2017,3:04 PM

## 2017-10-25 NOTE — Progress Notes (Signed)
PULMONARY / CRITICAL CARE MEDICINE   Name: Sally [name] MRN: [id number] DOB: [birth year]    ADMISSION DATE:  09/26/2017 CONSULTATION DATE:  1/1  REFERRING MD:  Juleen China (EDP)   CHIEF COMPLAINT:  Dyspnea   HISTORY OF PRESENT ILLNESS:   67 year old female smoker with hx severe COPD presented 1/1 with dyspnea. Failed bipap and was intubated in ER.  Flu A POS.  Prolonged course, difficulty weaning from vent.  Now s/p trach 1/15.    SUBJECTIVE:  Going for barium swallow, tolerating capping well.  VITAL SIGNS: BP 140/61   Pulse (!) 101   Temp 97.6 F (36.4 C) (Oral)   Resp 20   Ht 5\' 1"  (1.549 m)   Wt 156 lb 8.4 oz (71 kg)   SpO2 100%   BMI 29.58 kg/m   HEMODYNAMICS:    VENTILATOR SETTINGS: FiO2 (%):  [28 %] 28 %  INTAKE / OUTPUT: I/O last 3 completed shifts: In: 260 [P.O.:260] Out: 1800 [Urine:1800]  PHYSICAL EXAMINATION: General: 67 year old female on trach collar breathing comfortably HEENT: Trach collar in place, #6 cuffed trach in place, no JVD is appreciated Neuro: Moves all extremities x4 no foot drop CV: RRR, Nl S1/S2, -M/R/G PULM: CTA bilaterally GI: Soft, NT, ND and +BS Extremities: No acute deformity Skin: no rashes or lesions. Grossly intact   LABS:  BMET Recent Labs  Lab 10/21/17 0437 10/22/17 0336 10/25/17 0408  NA 137 137 138  K 3.1* 3.7 3.6  CL 100* 100* 103  CO2 24 23 25   BUN 19 14 13   CREATININE 0.43* 0.45 0.50  GLUCOSE 92 89 89    Electrolytes Recent Labs  Lab 10/21/17 0437 10/22/17 0336 10/25/17 0408  CALCIUM 8.9 8.8* 8.8*  MG  --  2.1  --   PHOS  --   --  4.6    CBC Recent Labs  Lab 10/21/17 0437 10/25/17 0408  WBC 13.9* 12.7*  HGB 10.9* 10.9*  HCT 33.3* 34.2*  PLT 383 399    Coag's No results for input(s): APTT, INR in the last 168 hours.  Sepsis Markers No results for input(s): LATICACIDVEN, PROCALCITON, O2SATVEN in the last 168 hours.  ABG No results for input(s): PHART, PCO2ART, PO2ART in the last 168  hours.  Liver Enzymes No results for input(s): AST, ALT, ALKPHOS, BILITOT, ALBUMIN in the last 168 hours.  Cardiac Enzymes No results for input(s): TROPONINI, PROBNP in the last 168 hours.  Glucose Recent Labs  Lab 10/24/17 1613 10/24/17 2016 10/24/17 2342 10/25/17 0331 10/25/17 0751 10/25/17 1223  GLUCAP 173* 129* 143* 91 74 141*    Imaging No results found.   STUDIES:    CULTURES: 1/1 RVP>>> POS flu A  1/1 sputum >>> rare coag neg staph   ANTIBIOTICS:   SIGNIFICANT EVENTS: 10/20/2017 for swallowing eval  LINES/TUBES: ETT 1/1>>>1/15 Trach 1/15>>> Chest tube >>> out 1/18  I reviewed CXR myself, trach is in good position  DISCUSSION: 67yo female with respiratory failure r/t AECOPD, flu A   ASSESSMENT / PLAN:  67 year old female with COPD and associated respiratory failure, failed to extubate and was trached during an admission for COPD exacerbation and pneumonia.  On exam, coarse BS diffusely.  I reviewed CXR myself, trach is in good position.  Discussed with PCCM-NP.  Respiratory failure:  - Monitor for airway protection  - SLP  - Maintain off the vent  COPD:  - Bronchodilators as ordered  Trach status:  - Cap cuffless 4  - Maintain capped  -  If capped til Friday will decannulate  Alyson Reedy, M.D. [facility] Pulmonary/Critical Care Medicine. Pager: [phone]. After hours pager: [phone].

## 2017-10-25 NOTE — Progress Notes (Signed)
  Speech Language Pathology Treatment: Dysphagia  Patient Details Name: Sally [name] MRN: [id number] DOB: [birth year] Today's Date: 10/25/2017 Time: 8588-5027 SLP Time Calculation (min) (ACUTE ONLY): 20 min  Assessment / Plan / Recommendation Clinical Impression  Pt's voice continues to improve, although with persistent low vocal quality and hoarseness. PMV was not needed this session given that pt's trach was capped this morning. However, pt's anxiety about trach/PMV persists, so education and hand out were given to pt to better understand her anatomy and breathing with trach in place. Pt was observed with thin liquids this session without overt signs of aspiration. Pt reported no trouble with current diet over past two days and is very excited to try thin liquids. Given risk of silent aspiration, MBS scheduled for this afternoon to determine safety to advance diet.    HPI HPI: 67 year old female with a past medical history significant for severe COPDcame to the emergency department on 09/26/2017 complaining of shortness of breath. Intubated on arrival. Two failed extubations: 1/8 and 1/11. Trach placed 1/15.       SLP Plan  New goals to be determined pending instrumental study       Recommendations  Diet recommendations: Nectar-thick liquid;Dysphagia 2 (fine chop) Liquids provided via: Cup Medication Administration: Crushed with puree Supervision: Patient able to self feed;Intermittent supervision to cue for compensatory strategies Compensations: Slow rate;Small sips/bites;Minimize environmental distractions;Multiple dry swallows after each bite/sip Postural Changes and/or Swallow Maneuvers: Seated upright 90 degrees                Oral Care Recommendations: Oral care BID Follow up Recommendations: Skilled Nursing facility;Inpatient Rehab SLP Visit Diagnosis: Dysphagia, oropharyngeal phase (R13.12) Plan: New goals to be determined pending instrumental study       GO                Sally [name] SLP Student Clinician  Sally [name] 10/25/2017, 1:47 PM

## 2017-10-25 NOTE — Progress Notes (Addendum)
Physical Therapy Treatment Patient Details Name: Sally [name] MRN: [id number] DOB: 27-Nov-1950 Today's Date: 10/25/2017    History of Present Illness Pt is a 67 y/o female admitted secondary to a COPD exacerbation and +Flu. Pt required intubation    PT Comments    Pt admitted with above diagnosis. Pt currently with functional limitations due to balance and endurance deficits. Pt was able to stand in [place] plus as she felt more comfortable with this equipment though still needing +2 assist.  Talked with pt about the fact that REhab would benefit her more than going home as she needs +2 assist currently and they can give her more frequent therapy than HH can.  Explained to her that she would be stronger when she went home but initially pt still states she is going home with family assist. Once PT and OT left, pt told nurse she is interested in Rehab prior to d/c home.  Will consult REhab. Pt will benefit from skilled PT to increase their independence and safety with mobility to allow discharge to the venue listed below.     Follow Up Recommendations  Supervision/Assistance - 24 hour;CIR     Equipment Recommendations  Hospital bed;Wheelchair [phone]) with antitippers, foot rests and desk armrests;Wheelchair cushion (18x16 pressure relieving);3in1 (PT)(hoyer lift)    Recommendations for Other Services Rehab consult     Precautions / Restrictions Precautions Precautions: Fall;Other (comment) Precaution Comments: trach capped Restrictions Weight Bearing Restrictions: No    Mobility  Bed Mobility Overal bed mobility: Needs Assistance Bed Mobility: Supine to Sit     Supine to sit: +2 for physical assistance;Mod assist;Min assist     General bed mobility comments: Assisted with LEs to EOB.  Pt pulled up on PT with mod assist.   Transfers Overall transfer level: Needs assistance Equipment used: Ambulation equipment used Transfers: Sit to/from Stand Sit to Stand: +2  safety/equipment;Max assist;Mod assist         General transfer comment: Used [place] Dec plus to stand pt with pt assisting movement.  Pt needed constant cues to activate hip extension and trunk extension.  The longer pt stood the better she did.  Pt stood 3 min and rested.  Then she stood 4 minutes.  Asked nursing to use [place] Dec plus for transfers so that pt could stand more during the day.    Ambulation/Gait                 Stairs            Wheelchair Mobility    Modified Rankin (Stroke Patients Only)       Balance Overall balance assessment: Needs assistance Sitting-balance support: Feet supported;No upper extremity supported Sitting balance-Leahy Scale: Fair   Postural control: Posterior lean Standing balance support: During functional activity;Bilateral upper extremity supported Standing balance-Leahy Scale: Poor Standing balance comment: Had to have bil UE support and max assist to come to standing in [place] plus.  Cues to maintain standing for up to 4 minutes.                             Cognition Arousal/Alertness: Awake/alert Behavior During Therapy: Anxious Overall Cognitive Status: Within Functional Limits for tasks assessed                                 General Comments: pt talking to communicate, reports her husband can handle her  at home      Exercises      General Comments        Pertinent Vitals/Pain Pain Assessment: 0-10 Faces Pain Scale: Hurts whole lot Pain Location: buttocks and perineal area Pain Descriptors / Indicators: Grimacing;Aching Pain Intervention(s): Monitored during session    Home Living                      Prior Function            PT Goals (current goals can now be found in the care plan section) Acute Rehab PT Goals Patient Stated Goal: to breathe and walk Progress towards PT goals: Progressing toward goals    Frequency    Min 3X/week      PT Plan Discharge plan needs to  be updated;Frequency needs to be updated    Co-evaluation PT/OT/SLP Co-Evaluation/Treatment: Yes Reason for Co-Treatment: For patient/therapist safety;Complexity of the patient's impairments (multi-system involvement) PT goals addressed during session: Mobility/safety with mobility;Balance;Strengthening/ROM;Proper use of DME OT goals addressed during session: Strengthening/ROM      AM-PAC PT "6 Clicks" Daily Activity  Outcome Measure  Difficulty turning over in bed (including adjusting bedclothes, sheets and blankets)?: Unable Difficulty moving from lying on back to sitting on the side of the bed? : Unable Difficulty sitting down on and standing up from a chair with arms (e.g., wheelchair, bedside commode, etc,.)?: Unable Help needed moving to and from a bed to chair (including a wheelchair)?: Total Help needed walking in hospital room?: Total Help needed climbing 3-5 steps with a railing? : Total 6 Click Score: 6    End of Session Equipment Utilized During Treatment: Gait belt;Oxygen;Other (comment)(2LO2, [place] Dec plus) Activity Tolerance: Patient limited by fatigue Patient left: with call bell/phone within reach;in chair;with chair alarm set Nurse Communication: Mobility status;Need for lift equipment(use [place] Dec plus to get pt back to bed) PT Visit Diagnosis: Other abnormalities of gait and mobility (R26.89);Muscle weakness (generalized) (M62.81)     Time: 7106-2694 PT Time Calculation (min) (ACUTE ONLY): 39 min  Charges:  $Therapeutic Activity: 23-37 mins                    G Codes:       Cyrene Gharibian,PT Acute Rehabilitation [phone] (pager)    Berline Lopes 10/25/2017, 12:23 PM

## 2017-10-25 NOTE — Progress Notes (Signed)
Occupational Therapy Treatment Patient Details Name: Sally [name] MRN: [id number] DOB: 1950/12/31 Today's Date: 10/25/2017    History of present illness Pt is a 67 y/o female admitted secondary to a COPD exacerbation and +Flu. Pt required intubation   OT comments  Pt's trach now capped. Pt continues to be eager to get stronger, but limited by fear of falling. Responded well to use of sara plus for sit to stand, tolerating 3 minutes, rest break and and additional 4 minutes. Pt wants to go home, but willing to consider inpatient rehab. Educated pt in benefits of CIR vs HH. Will continue to follow.  Follow Up Recommendations  CIR    Equipment Recommendations  Wheelchair (measurements OT);Wheelchair cushion (measurements OT);Hospital bed(hoyer lift)    Recommendations for Other Services      Precautions / Restrictions Precautions Precautions: Fall;Other (comment) Precaution Comments: trach capped Restrictions Weight Bearing Restrictions: No       Mobility Bed Mobility Overal bed mobility: Needs Assistance Bed Mobility: Supine to Sit     Supine to sit: +2 for physical assistance;Mod assist;Min assist     General bed mobility comments: Assisted with LEs to EOB.  Pt pulled up on PT with mod assist.   Transfers Overall transfer level: Needs assistance Equipment used: Ambulation equipment used Transfers: Sit to/from Stand Sit to Stand: +2 safety/equipment;Max assist;Mod assist         General transfer comment: Used [place] plus to stand pt with pt assisting movement.  Pt needed constant cues to activate hip extension and trunk extension.  The longer pt stood the better she did.  Pt stood 3 min and rested.  Then she stood 4 minutes.  Asked nursing to use [place] plus for transfers so that pt could stand more during the day.      Balance Overall balance assessment: Needs assistance Sitting-balance support: Feet supported;No upper extremity supported Sitting balance-Leahy Scale:  Fair   Postural control: Posterior lean Standing balance support: During functional activity;Bilateral upper extremity supported Standing balance-Leahy Scale: Poor Standing balance comment: Had to have bil UE support and max assist to come to standing in [place] plus.  Cues to maintain standing for up to 4 minutes.                            ADL either performed or assessed with clinical judgement   ADL Overall ADL's : Needs assistance/impaired                             Toileting- Clothing Manipulation and Hygiene: Total assistance;Bed level         General ADL Comments: Pt with high anxiety and fear of falling.      Vision       Perception     Praxis      Cognition Arousal/Alertness: Awake/alert Behavior During Therapy: Anxious Overall Cognitive Status: Within Functional Limits for tasks assessed                                 General Comments: pt talking to communicate, reports her husband can handle her at home        Exercises Exercises: General Lower Extremity   Shoulder Instructions       General Comments      Pertinent Vitals/ Pain       Pain Assessment: 0-10  Faces Pain Scale: Hurts whole lot Pain Location: buttocks and perineal area Pain Descriptors / Indicators: Grimacing;Aching Pain Intervention(s): Monitored during session  Home Living                                          Prior Functioning/Environment              Frequency  Min 2X/week        Progress Toward Goals  OT Goals(current goals can now be found in the care plan section)  Progress towards OT goals: Progressing toward goals  Acute Rehab OT Goals Patient Stated Goal: to breathe and walk OT Goal Formulation: With patient Time For Goal Achievement: 10/28/17 Potential to Achieve Goals: Good  Plan Discharge plan remains appropriate    Co-evaluation    PT/OT/SLP Co-Evaluation/Treatment: Yes Reason for  Co-Treatment: For patient/therapist safety;Complexity of the patient's impairments (multi-system involvement) PT goals addressed during session: Mobility/safety with mobility;Balance;Strengthening/ROM;Proper use of DME OT goals addressed during session: Strengthening/ROM      AM-PAC PT "6 Clicks" Daily Activity     Outcome Measure   Help from another person eating meals?: None Help from another person taking care of personal grooming?: A Little Help from another person toileting, which includes using toliet, bedpan, or urinal?: Total Help from another person bathing (including washing, rinsing, drying)?: A Lot Help from another person to put on and taking off regular upper body clothing?: A Little Help from another person to put on and taking off regular lower body clothing?: Total 6 Click Score: 14    End of Session Equipment Utilized During Treatment: Oxygen  OT Visit Diagnosis: Muscle weakness (generalized) (M62.81);Pain;Unsteadiness on feet (R26.81)   Activity Tolerance Patient tolerated treatment well   Patient Left in chair;with call bell/phone within reach;with chair alarm set   Nurse Communication Need for lift equipment        Time: 1024-1106 OT Time Calculation (min): 42 min  Charges: OT General Charges $OT Visit: 1 Visit OT Treatments $Therapeutic Activity: 8-22 mins  10/25/2017 Martie Round, OTR/L Pager: [phone]   Iran Planas [place] Bailiff 10/25/2017, 12:16 PM

## 2017-10-25 NOTE — Progress Notes (Signed)
[facility] PROGRESS NOTE  Sally [name] HDQ:[id number] DOB: 09/10/1951 DOA: 09/26/2017  PCP: [name], MD  Brief History/Interval Summary: Patient is a 67 year old Caucasian female with past medical history significant for severe COPD, and tobacco abuse.  Patient was admitted with COPD exacerbation.  On presentation, the patient's COPD exacerbation was so severe that the patient was placed on BiPAP.  The patient failed BiPAP and had to be intubated.  Subsequently, patient underwent tracheostomy.  Also developed pneumothorax requiring chest tube.  Subsequently transferred to stepdown unit.  Patient has improved over the last many days.  Pulmonology is managing her trach.  Patient's trach is currently.  The patient is undergoing swallowing/speech evaluation.  Workup done on presentation revealed influenza A H1 2009.  Reason for Visit: Acute on chronic hypoxic respiratory failure  Consultants: Pulmonology  Procedures: Tracheostomy.  Chest tube placement for pneumothorax.  Antibiotics: Zosyn 1/16 > 1/21 Ceftriaxone 1/5 > 1/9 Tamiflu 1/2 > 1/6   Subjective/Interval History: Patient's tracheostomy is capped.  Tracheostomy is also been down sized.  So far, the patient has tolerated all procedures.  No new complaints.  No fever or chills, no chest pain, no shortness of breath.  However, the patient has not back to her baseline.   ROS: Denies any nausea or vomiting  Objective:  Vital Signs  Vitals:   10/25/17 1225 10/25/17 1230 10/25/17 1626 10/25/17 1653  BP: 140/61  (!) 151/74 (!) 151/74  Pulse: (!) 101  92 91  Resp: 20  (!) 23 (!) 21  Temp: 97.6 F (36.4 C)  97.6 F (36.4 C)   TempSrc: Oral  Oral   SpO2: 97% 100% 98% 97%  Weight:      Height:        Intake/Output Summary (Last 24 hours) at 10/25/2017 1709 Last data filed at 10/25/2017 1559 Gross per 24 hour  Intake 480 ml  Output 850 ml  Net -370 ml   Filed Weights   10/23/17 0358 10/24/17 0401 10/25/17  0231  Weight: 70.9 kg (156 lb 4.9 oz) 68.9 kg (151 lb 14.4 oz) 71 kg (156 lb 8.4 oz)    General appearance: Awake alert.  In no distress Resp: Decreased air entry globally.  No added sounds.   Cardio: S1-S2 . GI: Abdomen is soft.  Nontender nondistended Extremities: No edema noted Neurologic: Improving strength in bilateral lower extremities which is most likely due to deconditioning.  No obvious focal neurological deficits noted.  Lab Results:  Data Reviewed: I have personally reviewed following labs and imaging studies  CBC: Recent Labs  Lab 10/21/17 0437 10/25/17 0408  WBC 13.9* 12.7*  HGB 10.9* 10.9*  HCT 33.3* 34.2*  MCV 96.0 97.7  PLT 383 119    Basic Metabolic Panel: Recent Labs  Lab 10/21/17 0437 10/22/17 0336 10/25/17 0408  NA 137 137 138  K 3.1* 3.7 3.6  CL 100* 100* 103  CO2 24 23 25   GLUCOSE 92 89 89  BUN 19 14 13   CREATININE 0.43* 0.45 0.50  CALCIUM 8.9 8.8* 8.8*  MG  --  2.1  --   PHOS  --   --  4.6    GFR: Estimated Creatinine Clearance: 63.2 mL/min (by C-G formula based on SCr of 0.5 mg/dL).  CBG: Recent Labs  Lab 10/24/17 2016 10/24/17 2342 10/25/17 0331 10/25/17 0751 10/25/17 1223  GLUCAP 129* 143* 91 74 141*     Radiology Studies: Dg Swallowing Func-speech Pathology  Result Date: 10/25/2017 Objective Swallowing Evaluation: Type  of Study: MBS-Modified Barium Swallow Study  Patient Details Name: Sally [name] MRN: [id number] Date of Birth: 1950-10-31 Today's Date: 10/25/2017 Time: SLP Start Time (ACUTE ONLY): 1410 -SLP Stop Time (ACUTE ONLY): 1430 SLP Time Calculation (min) (ACUTE ONLY): 20 min Past Medical History: Past Medical History: Diagnosis Date . Asthma  . Back pain  . COPD (chronic obstructive pulmonary disease) ([place])  . Crohn disease ([place])  . Hypertension  Past Surgical History: Past Surgical History: Procedure Laterality Date . ABDOMINAL HYSTERECTOMY   . CATARACT EXTRACTION, BILATERAL   . FRACTURE SURGERY Left   ARM HPI:  67 year old female with a past medical history significant for severe COPDcame to the emergency department on 09/26/2017 complaining of shortness of breath. Intubated on arrival. Two failed extubations: 1/8 and 1/11. Trach placed 1/15.  Subjective: Alert, calm, and cooperative during MBS Assessment / Plan / Recommendation CHL IP CLINICAL IMPRESSIONS 10/25/2017 Clinical Impression Pt presents with mild oropharyngeal dysphagia, with improved swallow function compared to previous MBS completed (1/25). Pt observed to have difficulty managing mixed consistencies, as indicated by X1 penetration during pill and thin liquid trial. Min verbal cues were used to clear throat and subsequently swallow to reduce penetrates left in laryngeal vestibule. However, pt consumed all other thin liquid trials without airway compromise. Pt's oral phase has improved, with only persistent deficit being slightly increased mastication and bolus formation time, likely consistent with baseline given lack of dentition. Given improved pharyngeal swallow, mild oral deficits, and pt's reports of globus sensation with some solids, recommend Dysphagia 3 (mech soft) and thin liquid diet. Pt requiring intermittent supervision to ensure aspiration precautions. Recommend medications administered whole in puree. SLP will continue to follow to ensure safety with diet and monitor for possible solid texture advancement.  SLP Visit Diagnosis Dysphagia, oropharyngeal phase (R13.12) Attention and concentration deficit following -- Frontal lobe and executive function deficit following -- Impact on safety and function Mild aspiration risk   CHL IP TREATMENT RECOMMENDATION 10/25/2017 Treatment Recommendations Therapy as outlined in treatment plan below   Prognosis 10/25/2017 Prognosis for Safe Diet Advancement Good Barriers to Reach Goals Other (Comment) Barriers/Prognosis Comment -- CHL IP DIET RECOMMENDATION 10/25/2017 SLP Diet Recommendations Dysphagia 3 (Mech soft)  solids;Thin liquid Liquid Administration via Cup;Straw Medication Administration Whole meds with puree Compensations Slow rate;Small sips/bites;Minimize environmental distractions Postural Changes Seated upright at 90 degrees   CHL IP OTHER RECOMMENDATIONS 10/25/2017 Recommended Consults -- Oral Care Recommendations Oral care BID Other Recommendations --   CHL IP FOLLOW UP RECOMMENDATIONS 10/25/2017 Follow up Recommendations Inpatient Rehab   CHL IP FREQUENCY AND DURATION 10/25/2017 Speech Therapy Frequency (ACUTE ONLY) min 2x/week Treatment Duration 2 weeks      CHL IP ORAL PHASE 10/25/2017 Oral Phase Impaired Oral - Pudding Teaspoon -- Oral - Pudding Cup -- Oral - Honey Teaspoon -- Oral - Honey Cup -- Oral - Nectar Teaspoon NT Oral - Nectar Cup NT Oral - Nectar Straw NT Oral - Thin Teaspoon NT Oral - Thin Cup Premature spillage Oral - Thin Straw Premature spillage Oral - Puree NT Oral - Mech Soft -- Oral - Regular Decreased bolus cohesion Oral - Multi-Consistency -- Oral - Pill Reduced posterior propulsion Oral Phase - Comment --  CHL IP PHARYNGEAL PHASE 10/25/2017 Pharyngeal Phase Impaired Pharyngeal- Pudding Teaspoon -- Pharyngeal -- Pharyngeal- Pudding Cup -- Pharyngeal -- Pharyngeal- Honey Teaspoon -- Pharyngeal -- Pharyngeal- Honey Cup -- Pharyngeal -- Pharyngeal- Nectar Teaspoon NT Pharyngeal -- Pharyngeal- Nectar Cup NT Pharyngeal -- Pharyngeal- Nectar Straw NT Pharyngeal --  Pharyngeal- Thin Teaspoon NT Pharyngeal -- Pharyngeal- Thin Cup WFL Pharyngeal -- Pharyngeal- Thin Straw Pharyngeal residue - valleculae;Penetration/Aspiration during swallow Pharyngeal Material enters airway, remains ABOVE vocal cords and not ejected out Pharyngeal- Puree NT Pharyngeal -- Pharyngeal- Mechanical Soft -- Pharyngeal -- Pharyngeal- Regular WFL Pharyngeal -- Pharyngeal- Multi-consistency -- Pharyngeal -- Pharyngeal- Pill Other (Comment) Pharyngeal -- Pharyngeal Comment --  CHL IP CERVICAL ESOPHAGEAL PHASE 10/25/2017 Cervical  Esophageal Phase WFL Pudding Teaspoon -- Pudding Cup -- Honey Teaspoon -- Honey Cup -- Nectar Teaspoon -- Nectar Cup -- Nectar Straw -- Thin Teaspoon -- Thin Cup -- Thin Straw -- Puree -- Mechanical Soft -- Regular -- Multi-consistency -- Pill -- Cervical Esophageal Comment -- No flowsheet data found. Germain Osgood 10/25/2017, 4:05 PM  Germain Osgood, M.A. CCC-SLP [phone]               Medications:  Scheduled: . arformoterol  15 mcg Nebulization BID  . aspirin  81 mg Oral Daily  . budesonide (PULMICORT) nebulizer solution  0.5 mg Nebulization BID  . chlorhexidine gluconate (MEDLINE KIT)  15 mL Mouth Rinse BID  . Chlorhexidine Gluconate Cloth  6 each Topical Daily  . diltiazem  30 mg Oral Q8H  . famotidine  20 mg Oral BID  . fluconazole  150 mg Oral Weekly  . free water  100 mL Per Tube Q8H  . heparin  5,000 Units Subcutaneous Q8H  . insulin aspart  0-15 Units Subcutaneous Q4H  . mouth rinse  15 mL Mouth Rinse QID  . nicotine  21 mg Transdermal Daily  . [START ON 10/26/2017] predniSONE  10 mg Oral Q breakfast   Followed by  . [START ON 10/31/2017] predniSONE  5 mg Oral Q breakfast   Continuous: . feeding supplement (VITAL AF 1.2 CAL) 1,000 mL (10/21/17 1600)   ZRA:[id number], gi cocktail, guaiFENesin, hydrALAZINE, HYDROcodone-acetaminophen, LORazepam, morphine injection, ondansetron (ZOFRAN) IV, RESOURCE THICKENUP CLEAR, senna, sodium chloride flush  Assessment COPD plan:  Active Problems:   Acute respiratory failure with hypoxemia (HCC)   Acute on chronic respiratory failure (HCC)   Influenza A   Acute respiratory failure (HCC)   Pneumothorax, traumatic   Tracheostomy status (HCC)   Hypoxemia    Acute on chronic hypoxic respiratory failure/COPD with exacerbation -Patient seems to have improved significantly. -Patient is currently on arformoterol, nebs Pulmicort and tapering dose of oral prednisone. -Patient's tracheostomy is capped.  The tracheostomy is also  been downsized. -Pulmonology has been following.   - Plan is to possibly decannulate patient in 2 days, October 27, 2017.       Dysphagia -Speech pathology input is appreciated. -This is likely related to prolonged intubation. -For them management as per speech evaluation.   Influenza A -Patient has completed course of treatment.    History of COPD with acute exacerbation Currently see above.     Pneumothorax, right-sided, iatrogenic during NG tube placement She had a chest tube placed by pulmonology.  Removed on 1/18.  Stable.  Right lower lobe infiltrate Treated with 5 days of empiric Zosyn.  Remains stable.  Hypokalemia Potassium today is 3.6. Will replete.   Last magnesium level checked on 10/22/2017 was 2.1.    History of GERD Pepcid  DVT Prophylaxis: Subcutaneous heparin    Code Status: Full code Family Communication: Discussed with the patient and her daughter. Disposition Plan: We will consult the physical therapy team to assess the patient.  Disposition will depend on the recommendations from the physical therapy team.  LOS: 29 days   [facility] Pager [phone] 10/25/2017, 5:09 PM  If 7PM-7AM, please contact night-coverage at [email or web address], password [email or web address]

## 2017-10-25 NOTE — Progress Notes (Signed)
Inpatient Rehabilitation  Per PT, OT request, patient was screened by Fae Pippin for appropriateness for an Inpatient Acute Rehab consult.  At this time we are recommending an Inpatient Rehab consult.  Text paged MD to notify; please order if you are agreeable.    Charlane Ferretti., CCC/SLP Admission Coordinator  [facility] Inpatient Rehabilitation  Cell [phone]

## 2017-10-25 NOTE — Progress Notes (Signed)
Patient's PMV was removed and then trach capped. She was then placed on 2 L/min by [place]. Showed no signs of distress. Inner cannula with obturator is at the head of the bed. RRT will continue to monitor.

## 2017-10-26 DIAGNOSIS — G6281 Critical illness polyneuropathy: Secondary | ICD-10-CM

## 2017-10-26 LAB — RENAL FUNCTION PANEL
Albumin: 2.9 g/dL — ABNORMAL LOW (ref 3.5–5.0)
Anion gap: 10 (ref 5–15)
BUN: 15 mg/dL (ref 6–20)
CO2: 26 mmol/L (ref 22–32)
Calcium: 8.8 mg/dL — ABNORMAL LOW (ref 8.9–10.3)
Chloride: 102 mmol/L (ref 101–111)
Creatinine, Ser: 0.44 mg/dL (ref 0.44–1.00)
GFR calc Af Amer: >60 mL/min (ref >60)
GFR calc non Af Amer: >60 mL/min (ref >60)
Glucose, Bld: 87 mg/dL (ref 65–99)
Phosphorus: 4.4 mg/dL (ref 2.5–4.6)
Potassium: 3.7 mmol/L (ref 3.5–5.1)
Sodium: 138 mmol/L (ref 135–145)

## 2017-10-26 LAB — MAGNESIUM: Magnesium: 2 mg/dL (ref 1.7–2.4)

## 2017-10-26 NOTE — Progress Notes (Signed)
Physical Therapy Treatment Patient Details Name: Sally [name] MRN: [id number] DOB: Apr 07, 1951 Today's Date: 10/26/2017    History of Present Illness Pt is a 67 y/o female admitted secondary to a COPD exacerbation and +Flu. Pt required intubation    PT Comments    Pt admitted with above diagnosis. Pt currently with functional limitations due to the deficits listed below (see PT Problem List). Pt was able to stand in [place] plus for 6 minutes.  Pt tolerating standing longer time period, weight shifting and states she can feel her feet today - tingling reported.  Will continue as pt tolerates.  1/6 goals met. Slow progress due to patient's anxiety and medical status fluctuations initially.  Revised goals today.   Pt will benefit from skilled PT to increase their independence and safety with mobility to allow discharge to the venue listed below.     Follow Up Recommendations  Supervision/Assistance - 24 hour;CIR     Equipment Recommendations  Hospital bed;Wheelchair (measurements PT);Wheelchair cushion (measurements PT);3in1 (PT)(hoyer lift)    Recommendations for Other Services Rehab consult     Precautions / Restrictions Precautions Precautions: Fall;Other (comment) Precaution Comments: trach capped Restrictions Weight Bearing Restrictions: No    Mobility  Bed Mobility Overal bed mobility: Needs Assistance Bed Mobility: Supine to Sit Rolling: +2 for physical assistance;Mod assist Sidelying to sit: +2 for physical assistance;Mod assist       General bed mobility comments: Assisted with LEs to EOB.  Pt pulled up on PT with mod assist.   Transfers Overall transfer level: Needs assistance Equipment used: Ambulation equipment used Transfers: Sit to/from Stand Sit to Stand: +2 safety/equipment;Max assist;Mod assist         General transfer comment: Used Clarise [place] plus to stand pt with pt assisting movement.  Pt needed intermittent cues to activate hip extension and trunk extension  as she seemed to do better than last treatment.  Pt stood 6 min.  Pt was able to weight shift left and right and lift foot up off of the platform.  Pt did buckle a few times but knee plate was in place.  Pt also states her feet feel "tingly" today as opposed to previously no feeling.  Encouraged nursing to use the Clarise Cruz plus for all transfers so that pt could stand more during the day and they agree.    Ambulation/Gait                 Stairs            Wheelchair Mobility    Modified Rankin (Stroke Patients Only)       Balance Overall balance assessment: Needs assistance Sitting-balance support: Feet supported;No upper extremity supported Sitting balance-Leahy Scale: Fair     Standing balance support: During functional activity;Bilateral upper extremity supported Standing balance-Leahy Scale: Poor Standing balance comment: Had to have bil UE support and max assist to come to standing in [place] plus.  Cues to maintain standing for up to 6 minutes.  Ability to weight shift.                             Cognition Arousal/Alertness: Awake/alert Behavior During Therapy: Anxious Overall Cognitive Status: Within Functional Limits for tasks assessed                                 General Comments: pt talking to communicate, reports her  husband can handle her at home      Exercises General Exercises - Lower Extremity Ankle Circles/Pumps: AAROM;Both;10 reps;Seated Long Arc Quad: AROM;Right;Left;Seated;10 reps Heel Slides: AAROM;Right;Left;5 reps;Supine Hip Flexion/Marching: AROM;Both;10 reps;Seated    General Comments        Pertinent Vitals/Pain Pain Assessment: Faces Faces Pain Scale: Hurts little more Pain Location: back  Pain Descriptors / Indicators: Grimacing Pain Intervention(s): Limited activity within patient's tolerance;Monitored during session;Premedicated before session;Repositioned    Home Living                       Prior Function            PT Goals (current goals can now be found in the care plan section) Acute Rehab PT Goals Patient Stated Goal: to breathe and walk PT Goal Formulation: With patient Time For Goal Achievement: 11/09/17 Potential to Achieve Goals: Good Progress towards PT goals: Progressing toward goals    Frequency    Min 3X/week      PT Plan Current plan remains appropriate    Co-evaluation              AM-PAC PT "6 Clicks" Daily Activity  Outcome Measure  Difficulty turning over in bed (including adjusting bedclothes, sheets and blankets)?: Unable Difficulty moving from lying on back to sitting on the side of the bed? : Unable Difficulty sitting down on and standing up from a chair with arms (e.g., wheelchair, bedside commode, etc,.)?: Unable Help needed moving to and from a bed to chair (including a wheelchair)?: Total Help needed walking in hospital room?: Total Help needed climbing 3-5 steps with a railing? : Total 6 Click Score: 6    End of Session Equipment Utilized During Treatment: Gait belt;Oxygen;Other (comment)(2LO2, Clarise Cruz plus) Activity Tolerance: Patient limited by fatigue Patient left: with call bell/phone within reach;in chair;with chair alarm set Nurse Communication: Mobility status;Need for lift equipment(use Clarise [place] plus to get pt back to bed) PT Visit Diagnosis: Other abnormalities of gait and mobility (R26.89);Muscle weakness (generalized) (M62.81)     Time: 9937-1696 PT Time Calculation (min) (ACUTE ONLY): 27 min  Charges:  $Therapeutic Activity: 23-37 mins                    G Codes:       Larah Kuntzman,PT Acute Rehabilitation [phone] (pager)    Denice Paradise 10/26/2017, 1:14 PM

## 2017-10-26 NOTE — Progress Notes (Signed)
[facility] PROGRESS NOTE  Sally [name] SEL:[id number] DOB: Nov 22, 1950 DOA: 09/26/2017  PCP: [name], MD  Brief History/Interval Summary: Patient is a 67 year old Caucasian female with past medical history significant for severe COPD, and tobacco abuse.  Patient was admitted with COPD exacerbation.  On presentation, the patient's COPD exacerbation was so severe that the patient was placed on BiPAP.  The patient failed BiPAP and had to be intubated.  Subsequently, patient underwent tracheostomy.  Currently, the trach is capped.  Plan is for possible decannulation 10/27/2017.  Patient developed pneumothorax during the hospital stay requiring chest tube.  Subsequently transferred to stepdown unit.  Patient has improved over the last many days.  Pulmonology is managing her trach.  Patient's trach is currently.  The patient is undergoing swallowing/speech evaluation.  Workup done on presentation revealed influenza A H1 2009.  Reason for Visit: Acute on chronic hypoxic respiratory failure  Consultants: Pulmonology  Procedures: Tracheostomy.  Chest tube placement for pneumothorax.  Antibiotics: Zosyn 1/16 > 1/21 Ceftriaxone 1/5 > 1/9 Tamiflu 1/2 > 1/6   Subjective/Interval History: Patient's tracheostomy is capped.  Tracheostomy is also been down sized.  Plan is for decannulation on 10/28/2007.  No new complaints.  No fever or chills, no chest pain, no shortness of breath.  Patient is not back to her baseline.   ROS: Denies any nausea or vomiting  Objective:  Vital Signs  Vitals:   10/26/17 0731 10/26/17 0815 10/26/17 0818 10/26/17 0900  BP: (!) 142/58  (!) 143/67 94/70  Pulse: 73  73 74  Resp: 16  11 (!) 21  Temp: 98 F (36.7 C)     TempSrc: Oral     SpO2: 100% 98% 100% 99%  Weight:      Height:        Intake/Output Summary (Last 24 hours) at 10/26/2017 1043 Last data filed at 10/26/2017 0900 Gross per 24 hour  Intake 720 ml  Output 500 ml  Net 220 ml    Filed Weights   10/24/17 0401 10/25/17 0231 10/26/17 0355  Weight: 68.9 kg (151 lb 14.4 oz) 71 kg (156 lb 8.4 oz) 69.3 kg (152 lb 12.5 oz)    General appearance: Awake alert.  In no distress Resp: Decreased air entry globally.  No added sounds.   Cardio: S1-S2 . GI: Abdomen is soft.  Nontender nondistended Extremities: No edema noted Neurologic: Improving strength in bilateral lower extremities which is most likely due to deconditioning.  No obvious focal neurological deficits noted.  Lab Results:  Data Reviewed: I have personally reviewed following labs and imaging studies  CBC: Recent Labs  Lab 10/21/17 0437 10/25/17 0408  WBC 13.9* 12.7*  HGB 10.9* 10.9*  HCT 33.3* 34.2*  MCV 96.0 97.7  PLT 383 356    Basic Metabolic Panel: Recent Labs  Lab 10/21/17 0437 10/22/17 0336 10/25/17 0408 10/26/17 0458  NA 137 137 138 138  K 3.1* 3.7 3.6 3.7  CL 100* 100* 103 102  CO2 24 23 25 26   GLUCOSE 92 89 89 87  BUN 19 14 13 15   CREATININE 0.43* 0.45 0.50 0.44  CALCIUM 8.9 8.8* 8.8* 8.8*  MG  --  2.1  --  2.0  PHOS  --   --  4.6 4.4    GFR: Estimated Creatinine Clearance: 62.4 mL/min (by C-G formula based on SCr of 0.44 mg/dL).  CBG: Recent Labs  Lab 10/24/17 2342 10/25/17 0331 10/25/17 0751 10/25/17 1223 10/25/17 1628  GLUCAP 143* 91 74  141* 131*     Radiology Studies: Dg Swallowing Func-speech Pathology  Result Date: 10/25/2017 Objective Swallowing Evaluation: Type of Study: MBS-Modified Barium Swallow Study  Patient Details Name: Sally [name] MRN: [id number] Date of Birth: Jul 16, 1951 Today's Date: 10/25/2017 Time: SLP Start Time (ACUTE ONLY): 1410 -SLP Stop Time (ACUTE ONLY): 1430 SLP Time Calculation (min) (ACUTE ONLY): 20 min Past Medical History: Past Medical History: Diagnosis Date . Asthma  . Back pain  . COPD (chronic obstructive pulmonary disease) ([place])  . Crohn disease ([place])  . Hypertension  Past Surgical History: Past Surgical History: Procedure  Laterality Date . ABDOMINAL HYSTERECTOMY   . CATARACT EXTRACTION, BILATERAL   . FRACTURE SURGERY Left   ARM HPI: 67 year old female with a past medical history significant for severe COPDcame to the emergency department on 09/26/2017 complaining of shortness of breath. Intubated on arrival. Two failed extubations: 1/8 and 1/11. Trach placed 1/15.  Subjective: Alert, calm, and cooperative during MBS Assessment / Plan / Recommendation CHL IP CLINICAL IMPRESSIONS 10/25/2017 Clinical Impression Pt presents with mild oropharyngeal dysphagia, with improved swallow function compared to previous MBS completed (1/25). Pt observed to have difficulty managing mixed consistencies, as indicated by X1 penetration during pill and thin liquid trial. Min verbal cues were used to clear throat and subsequently swallow to reduce penetrates left in laryngeal vestibule. However, pt consumed all other thin liquid trials without airway compromise. Pt's oral phase has improved, with only persistent deficit being slightly increased mastication and bolus formation time, likely consistent with baseline given lack of dentition. Given improved pharyngeal swallow, mild oral deficits, and pt's reports of globus sensation with some solids, recommend Dysphagia 3 (mech soft) and thin liquid diet. Pt requiring intermittent supervision to ensure aspiration precautions. Recommend medications administered whole in puree. SLP will continue to follow to ensure safety with diet and monitor for possible solid texture advancement.  SLP Visit Diagnosis Dysphagia, oropharyngeal phase (R13.12) Attention and concentration deficit following -- Frontal lobe and executive function deficit following -- Impact on safety and function Mild aspiration risk   CHL IP TREATMENT RECOMMENDATION 10/25/2017 Treatment Recommendations Therapy as outlined in treatment plan below   Prognosis 10/25/2017 Prognosis for Safe Diet Advancement Good Barriers to Reach Goals Other (Comment)  Barriers/Prognosis Comment -- CHL IP DIET RECOMMENDATION 10/25/2017 SLP Diet Recommendations Dysphagia 3 (Mech soft) solids;Thin liquid Liquid Administration via Cup;Straw Medication Administration Whole meds with puree Compensations Slow rate;Small sips/bites;Minimize environmental distractions Postural Changes Seated upright at 90 degrees   CHL IP OTHER RECOMMENDATIONS 10/25/2017 Recommended Consults -- Oral Care Recommendations Oral care BID Other Recommendations --   CHL IP FOLLOW UP RECOMMENDATIONS 10/25/2017 Follow up Recommendations Inpatient Rehab   CHL IP FREQUENCY AND DURATION 10/25/2017 Speech Therapy Frequency (ACUTE ONLY) min 2x/week Treatment Duration 2 weeks      CHL IP ORAL PHASE 10/25/2017 Oral Phase Impaired Oral - Pudding Teaspoon -- Oral - Pudding Cup -- Oral - Honey Teaspoon -- Oral - Honey Cup -- Oral - Nectar Teaspoon NT Oral - Nectar Cup NT Oral - Nectar Straw NT Oral - Thin Teaspoon NT Oral - Thin Cup Premature spillage Oral - Thin Straw Premature spillage Oral - Puree NT Oral - Mech Soft -- Oral - Regular Decreased bolus cohesion Oral - Multi-Consistency -- Oral - Pill Reduced posterior propulsion Oral Phase - Comment --  CHL IP PHARYNGEAL PHASE 10/25/2017 Pharyngeal Phase Impaired Pharyngeal- Pudding Teaspoon -- Pharyngeal -- Pharyngeal- Pudding Cup -- Pharyngeal -- Pharyngeal- Honey Teaspoon -- Pharyngeal -- Pharyngeal- Honey Cup --  Pharyngeal -- Pharyngeal- Nectar Teaspoon NT Pharyngeal -- Pharyngeal- Nectar Cup NT Pharyngeal -- Pharyngeal- Nectar Straw NT Pharyngeal -- Pharyngeal- Thin Teaspoon NT Pharyngeal -- Pharyngeal- Thin Cup WFL Pharyngeal -- Pharyngeal- Thin Straw Pharyngeal residue - valleculae;Penetration/Aspiration during swallow Pharyngeal Material enters airway, remains ABOVE vocal cords and not ejected out Pharyngeal- Puree NT Pharyngeal -- Pharyngeal- Mechanical Soft -- Pharyngeal -- Pharyngeal- Regular WFL Pharyngeal -- Pharyngeal- Multi-consistency -- Pharyngeal --  Pharyngeal- Pill Other (Comment) Pharyngeal -- Pharyngeal Comment --  CHL IP CERVICAL ESOPHAGEAL PHASE 10/25/2017 Cervical Esophageal Phase WFL Pudding Teaspoon -- Pudding Cup -- Honey Teaspoon -- Honey Cup -- Nectar Teaspoon -- Nectar Cup -- Nectar Straw -- Thin Teaspoon -- Thin Cup -- Thin Straw -- Puree -- Mechanical Soft -- Regular -- Multi-consistency -- Pill -- Cervical Esophageal Comment -- No flowsheet data found. Germain Osgood 10/25/2017, 4:05 PM  Germain Osgood, M.A. CCC-SLP [phone]               Medications:  Scheduled: . arformoterol  15 mcg Nebulization BID  . aspirin  81 mg Oral Daily  . budesonide (PULMICORT) nebulizer solution  0.5 mg Nebulization BID  . chlorhexidine gluconate (MEDLINE KIT)  15 mL Mouth Rinse BID  . Chlorhexidine Gluconate Cloth  6 each Topical Daily  . diltiazem  30 mg Oral Q8H  . famotidine  20 mg Oral BID  . fluconazole  150 mg Oral Weekly  . heparin  5,000 Units Subcutaneous Q8H  . mouth rinse  15 mL Mouth Rinse QID  . nicotine  21 mg Transdermal Daily  . predniSONE  10 mg Oral Q breakfast   Followed by  . [START ON 10/31/2017] predniSONE  5 mg Oral Q breakfast   Continuous: . feeding supplement (VITAL AF 1.2 CAL) 1,000 mL (10/21/17 1600)   IHK:[id number], gi cocktail, guaiFENesin, hydrALAZINE, HYDROcodone-acetaminophen, LORazepam, morphine injection, ondansetron (ZOFRAN) IV, RESOURCE THICKENUP CLEAR, senna, sodium chloride flush  Assessment COPD plan:  Active Problems:   Acute respiratory failure with hypoxemia (HCC)   Acute on chronic respiratory failure (HCC)   Influenza A   Acute respiratory failure (HCC)   Pneumothorax, traumatic   Tracheostomy status (HCC)   Hypoxemia    Acute on chronic hypoxic respiratory failure/COPD with exacerbation -Patient seems to have improved significantly, and stable. -Patient is currently on arformoterol, nebs Pulmicort and tapering dose of oral prednisone. -Patient's tracheostomy is capped.   The tracheostomy is also been downsized. -Pulmonology has been following.   - Plan is to possibly decannulate patient on October 27, 2017.       Dysphagia -Speech pathology input is appreciated. -This is likely related to prolonged intubation. -For them management as per speech evaluation.   Influenza A -Patient has completed course of treatment.    History of COPD with acute exacerbation Currently see above.     Pneumothorax, right-sided, iatrogenic during NG tube placement She had a chest tube placed by pulmonology.  Removed on 1/18.  Stable.  Right lower lobe infiltrate Treated with 5 days of empiric Zosyn.  Remains stable.  Hypokalemia Potassium today is 3.7. Will replete.   Magnesium level today is 2.  History of GERD Pepcid  DVT Prophylaxis: Subcutaneous heparin    Code Status: Full code Family Communication: Discussed with the patient and her daughter. Disposition Plan: We will consult the physical therapy team to assess the patient.  Disposition will depend on the recommendations from the physical therapy team.    LOS: 30 days   Babs Bertin  Marthenia Rolling  [facility] Pager [phone] 10/26/2017, 10:43 AM  If 7PM-7AM, please contact night-coverage at [email or web address], password [facility]

## 2017-10-26 NOTE — Progress Notes (Signed)
  Speech Language Pathology Treatment: Dysphagia  Patient Details Name: Sally [name] MRN: [id number] DOB: 05-Dec-1950 Today's Date: 10/26/2017 Time: 3704-8889 SLP Time Calculation (min) (ACUTE ONLY): 33 min  Assessment / Plan / Recommendation Clinical Impression  Pt consumed regular solids and thin liquids with Min cues for aspiration precautions. Only one immediate cough was noted across all trials. SLP provided assistance to order upcoming meals, with pt requesting some foods that are more advanced than her current diet textures. Oral preparation was adequate on trials at bedside given extra time, and pt said that she was going to ask her family to bring in her dentures. Recommend advancement to regular diet, continuing thin liquids. SLP educated the pt about the need to fully masticate her food, which may mean selecting mildly softer options overall. Pt verbalized her understanding and has been very cautious so far when it comes to eating. SLP will continue to follow for tolerance of advanced diet. Education was also provided about pt's vocal quality and need for monitoring as she continues to progress overall.   HPI HPI: 67 year old female with a past medical history significant for severe COPDcame to the emergency department on 09/26/2017 complaining of shortness of breath. Intubated on arrival. Two failed extubations: 1/8 and 1/11. Trach placed 1/15.       SLP Plan  Continue with current plan of care       Recommendations  Diet recommendations: Regular;Thin liquid Liquids provided via: Cup;Straw Medication Administration: Whole meds with puree Supervision: Patient able to self feed;Intermittent supervision to cue for compensatory strategies Compensations: Slow rate;Small sips/bites;Minimize environmental distractions Postural Changes and/or Swallow Maneuvers: Seated upright 90 degrees                Oral Care Recommendations: Oral care BID Follow up Recommendations: Inpatient  Rehab SLP Visit Diagnosis: Dysphagia, oropharyngeal phase (R13.12) Plan: Continue with current plan of care       GO                Maxcine Ham 10/26/2017, 1:14 PM  Maxcine Ham, M.A. CCC-SLP [phone]

## 2017-10-26 NOTE — Consult Note (Addendum)
Physical Medicine and Rehabilitation Consult Reason for Consult:Decreased functional mobility Referring Physician: Critical care  HPI: Sally [name] is a 67 y.o.right handed female with history of hypertension, severe COPD with tobacco abuse. Per chart review patient lives with spouse. Husband can assist as needed as well as daughter. Reported to be independent prior to admission. Presented 09/26/2017 with increasing shortness of breath and cough she failed BiPAP and required intubation.WBC 21,900.Placed on broad-spectrum antibiotics.hospital course prolonged intubation requiring tracheostomy tube 10/10/2017 downsize to a #4 cuffless and plan decannulation 10/27/2017.Pneumothorax requiring chest tube that was removed 10/13/2017. Currently on a mechanical soft thin liquid diet. Subcutaneous heparin for DVT prophylaxis.Acute on chronic anemia 10.9 and monitored. Physical occupational therapy evaluations completed 10/14/2017  with recommendations of physical medicine rehabilitation consult.   Review of Systems  Constitutional: Negative for chills and fever.  HENT: Negative for hearing loss.   Eyes: Negative for blurred vision and double vision.  Respiratory: Positive for cough and shortness of breath.   Cardiovascular: Positive for leg swelling. Negative for chest pain and palpitations.  Gastrointestinal: Positive for constipation. Negative for nausea and vomiting.  Genitourinary: Negative for dysuria, flank pain and hematuria.  Musculoskeletal: Positive for back pain.  Skin: Negative for rash.  Neurological: Positive for weakness. Negative for seizures.  All other systems reviewed and are negative.  Past Medical History:  Diagnosis Date  . Asthma   . Back pain   . COPD (chronic obstructive pulmonary disease) (HCC)   . Crohn disease (HCC)   . Hypertension    Past Surgical History:  Procedure Laterality Date  . ABDOMINAL HYSTERECTOMY    . CATARACT EXTRACTION, BILATERAL    .  FRACTURE SURGERY Left    ARM   Family History  Problem Relation Age of Onset  . Hypertension Other    Social History:  reports that she has been smoking.  she has never used smokeless tobacco. She reports that she does not drink alcohol or use drugs. Allergies: No Known Allergies Medications Prior to Admission  Medication Sig Dispense Refill  . albuterol (PROVENTIL) (2.5 MG/3ML) 0.083% nebulizer solution Take 2.5 mg by nebulization every 6 (six) hours as needed for wheezing or shortness of breath.   5  . [EXPIRED] cefdinir (OMNICEF) 300 MG capsule Take 300 mg by mouth 2 (two) times daily.    . methylPREDNISolone (MEDROL DOSEPAK) 4 MG TBPK tablet Take by mouth.    Marland Kitchen albuterol (PROVENTIL HFA;VENTOLIN HFA) 108 (90 Base) MCG/ACT inhaler Inhale 1-2 puffs into the lungs every 6 (six) hours as needed for wheezing or shortness of breath.    Ailene Ards ELLIPTA 62.5-25 MCG/INH AEPB Inhale 1 puff into the lungs daily.   11  . aspirin EC 81 MG tablet Take 81 mg by mouth daily.    Marland Kitchen atorvastatin (LIPITOR) 20 MG tablet Take 20 mg by mouth at bedtime.    Marland Kitchen DILT-XR 240 MG 24 hr capsule Take 240 mg by mouth daily.  11  . DULoxetine (CYMBALTA) 30 MG capsule Take 1 capsule (30 mg total) by mouth daily. 30 capsule 3  . FLOVENT HFA 110 MCG/ACT inhaler Inhale 2 puffs into the lungs every 12 (twelve) hours.   11  . hydrOXYzine (VISTARIL) 25 MG capsule Take 1 capsule (25 mg total) by mouth 3 (three) times daily as needed for anxiety. 30 capsule 2  . predniSONE (DELTASONE) 20 MG tablet Take 2 tablets (40 mg total) by mouth daily with breakfast. 6 tablet 0  Home: Home Living Family/patient expects to be discharged to:: Unsure Living Arrangements: Spouse/significant other Additional Comments: Pt with trach in place, no family caregivers to provide any information  Functional History: Prior Function Comments: unsure Functional Status:  Mobility: Bed Mobility Overal bed mobility: Needs Assistance Bed  Mobility: Supine to Sit Rolling: +2 for physical assistance, Mod assist Sidelying to sit: +2 for physical assistance, Mod assist Supine to sit: +2 for physical assistance, Mod assist, Min assist Sit to supine: Min assist General bed mobility comments: Assisted with LEs to EOB.  Pt pulled up on PT with mod assist.  Transfers Overall transfer level: Needs assistance Equipment used: Ambulation equipment used Transfer via Lift Equipment: Hydrographic surveyor Transfers: Sit to/from Stand Sit to Stand: +2 safety/equipment, Max assist, Mod assist General transfer comment: Used [place] Dec plus to stand pt with pt assisting movement.  Pt needed constant cues to activate hip extension and trunk extension.  The longer pt stood the better she did.  Pt stood 3 min and rested.  Then she stood 4 minutes.  Asked nursing to use Huntley Dec plus for transfers so that pt could stand more during the day.        ADL: ADL Overall ADL's : Needs assistance/impaired Eating/Feeding: Set up, Supervision/ safety, Bed level Grooming: Wash/dry hands, Wash/dry face, Bed level, Set up Toileting- Clothing Manipulation and Hygiene: Total assistance, Bed level General ADL Comments: Pt with high anxiety and fear of falling.   Cognition: Cognition Overall Cognitive Status: Within Functional Limits for tasks assessed Orientation Level: Oriented X4 Cognition Arousal/Alertness: Awake/alert Behavior During Therapy: Anxious Overall Cognitive Status: Within Functional Limits for tasks assessed General Comments: pt talking to communicate, reports her husband can handle her at home Difficult to assess due to: (trach capped)  Blood pressure (!) 143/67, pulse 73, temperature 98 F (36.7 C), temperature source Oral, resp. rate 11, height 5\' 1"  (1.549 m), weight 69.3 kg (152 lb 12.5 oz), SpO2 100 %. Physical Exam  Vitals reviewed. Constitutional: She is oriented to person, place, and time.  HENT:  Head: Normocephalic.  Eyes: EOM are normal.    Neck: Normal range of motion. Neck supple.  Tracheostomy is capped  Cardiovascular: Normal rate and regular rhythm.  Respiratory:  Decreased breath sounds but clear to auscultation  GI: Soft. Bowel sounds are normal. She exhibits no distension.  Neurological: She is alert and oriented to person, place, and time.  UE 3+ 4-/5 prox to 4-/5 distally. LE: 3+ HF, KE and 3PF, 0 to tr ADF bilaterally. Sensory loss from mid calf down bilaterally. Some sensory loss in fingers also  Skin: Skin is warm and dry.    Results for orders placed or performed during the hospital encounter of 09/26/17 (from the past 24 hour(s))  Glucose, capillary     Status: Abnormal   Collection Time: 10/25/17 12:23 PM  Result Value Ref Range   Glucose-Capillary 141 (H) 65 - 99 mg/dL  Glucose, capillary     Status: Abnormal   Collection Time: 10/25/17  4:28 PM  Result Value Ref Range   Glucose-Capillary 131 (H) 65 - 99 mg/dL  Renal function panel     Status: Abnormal   Collection Time: 10/26/17  4:58 AM  Result Value Ref Range   Sodium 138 135 - 145 mmol/L   Potassium 3.7 3.5 - 5.1 mmol/L   Chloride 102 101 - 111 mmol/L   CO2 26 22 - 32 mmol/L   Glucose, Bld 87 65 - 99 mg/dL   BUN  15 6 - 20 mg/dL   Creatinine, Ser 6.70 0.44 - 1.00 mg/dL   Calcium 8.8 (L) 8.9 - 10.3 mg/dL   Phosphorus 4.4 2.5 - 4.6 mg/dL   Albumin 2.9 (L) 3.5 - 5.0 g/dL   GFR calc non Af Amer >60 >60 mL/min   GFR calc Af Amer >60 >60 mL/min   Anion gap 10 5 - 15  Magnesium     Status: None   Collection Time: 10/26/17  4:58 AM  Result Value Ref Range   Magnesium 2.0 1.7 - 2.4 mg/dL   Dg Swallowing Func-speech Pathology  Result Date: 10/25/2017 Objective Swallowing Evaluation: Type of Study: MBS-Modified Barium Swallow Study  Patient Details Name: Kelene Mouradian MRN: [id number] Date of Birth: 1950-11-29 Today's Date: 10/25/2017 Time: SLP Start Time (ACUTE ONLY): 1410 -SLP Stop Time (ACUTE ONLY): 1430 SLP Time Calculation (min) (ACUTE ONLY): 20  min Past Medical History: Past Medical History: Diagnosis Date . Asthma  . Back pain  . COPD (chronic obstructive pulmonary disease) (HCC)  . Crohn disease (HCC)  . Hypertension  Past Surgical History: Past Surgical History: Procedure Laterality Date . ABDOMINAL HYSTERECTOMY   . CATARACT EXTRACTION, BILATERAL   . FRACTURE SURGERY Left   ARM HPI: 67 year old female with a past medical history significant for severe COPDcame to the emergency department on 09/26/2017 complaining of shortness of breath. Intubated on arrival. Two failed extubations: 1/8 and 1/11. Trach placed 1/15.  Subjective: Alert, calm, and cooperative during MBS Assessment / Plan / Recommendation CHL IP CLINICAL IMPRESSIONS 10/25/2017 Clinical Impression Pt presents with mild oropharyngeal dysphagia, with improved swallow function compared to previous MBS completed (1/25). Pt observed to have difficulty managing mixed consistencies, as indicated by X1 penetration during pill and thin liquid trial. Min verbal cues were used to clear throat and subsequently swallow to reduce penetrates left in laryngeal vestibule. However, pt consumed all other thin liquid trials without airway compromise. Pt's oral phase has improved, with only persistent deficit being slightly increased mastication and bolus formation time, likely consistent with baseline given lack of dentition. Given improved pharyngeal swallow, mild oral deficits, and pt's reports of globus sensation with some solids, recommend Dysphagia 3 (mech soft) and thin liquid diet. Pt requiring intermittent supervision to ensure aspiration precautions. Recommend medications administered whole in puree. SLP will continue to follow to ensure safety with diet and monitor for possible solid texture advancement.  SLP Visit Diagnosis Dysphagia, oropharyngeal phase (R13.12) Attention and concentration deficit following -- Frontal lobe and executive function deficit following -- Impact on safety and function Mild  aspiration risk   CHL IP TREATMENT RECOMMENDATION 10/25/2017 Treatment Recommendations Therapy as outlined in treatment plan below   Prognosis 10/25/2017 Prognosis for Safe Diet Advancement Good Barriers to Reach Goals Other (Comment) Barriers/Prognosis Comment -- CHL IP DIET RECOMMENDATION 10/25/2017 SLP Diet Recommendations Dysphagia 3 (Mech soft) solids;Thin liquid Liquid Administration via Cup;Straw Medication Administration Whole meds with puree Compensations Slow rate;Small sips/bites;Minimize environmental distractions Postural Changes Seated upright at 90 degrees   CHL IP OTHER RECOMMENDATIONS 10/25/2017 Recommended Consults -- Oral Care Recommendations Oral care BID Other Recommendations --   CHL IP FOLLOW UP RECOMMENDATIONS 10/25/2017 Follow up Recommendations Inpatient Rehab   CHL IP FREQUENCY AND DURATION 10/25/2017 Speech Therapy Frequency (ACUTE ONLY) min 2x/week Treatment Duration 2 weeks      CHL IP ORAL PHASE 10/25/2017 Oral Phase Impaired Oral - Pudding Teaspoon -- Oral - Pudding Cup -- Oral - Honey Teaspoon -- Oral - Honey Cup -- Oral -  Nectar Teaspoon NT Oral - Nectar Cup NT Oral - Nectar Straw NT Oral - Thin Teaspoon NT Oral - Thin Cup Premature spillage Oral - Thin Straw Premature spillage Oral - Puree NT Oral - Mech Soft -- Oral - Regular Decreased bolus cohesion Oral - Multi-Consistency -- Oral - Pill Reduced posterior propulsion Oral Phase - Comment --  CHL IP PHARYNGEAL PHASE 10/25/2017 Pharyngeal Phase Impaired Pharyngeal- Pudding Teaspoon -- Pharyngeal -- Pharyngeal- Pudding Cup -- Pharyngeal -- Pharyngeal- Honey Teaspoon -- Pharyngeal -- Pharyngeal- Honey Cup -- Pharyngeal -- Pharyngeal- Nectar Teaspoon NT Pharyngeal -- Pharyngeal- Nectar Cup NT Pharyngeal -- Pharyngeal- Nectar Straw NT Pharyngeal -- Pharyngeal- Thin Teaspoon NT Pharyngeal -- Pharyngeal- Thin Cup WFL Pharyngeal -- Pharyngeal- Thin Straw Pharyngeal residue - valleculae;Penetration/Aspiration during swallow Pharyngeal Material  enters airway, remains ABOVE vocal cords and not ejected out Pharyngeal- Puree NT Pharyngeal -- Pharyngeal- Mechanical Soft -- Pharyngeal -- Pharyngeal- Regular WFL Pharyngeal -- Pharyngeal- Multi-consistency -- Pharyngeal -- Pharyngeal- Pill Other (Comment) Pharyngeal -- Pharyngeal Comment --  CHL IP CERVICAL ESOPHAGEAL PHASE 10/25/2017 Cervical Esophageal Phase WFL Pudding Teaspoon -- Pudding Cup -- Honey Teaspoon -- Honey Cup -- Nectar Teaspoon -- Nectar Cup -- Nectar Straw -- Thin Teaspoon -- Thin Cup -- Thin Straw -- Puree -- Mechanical Soft -- Regular -- Multi-consistency -- Pill -- Cervical Esophageal Comment -- No flowsheet data found. Maxcine Ham 10/25/2017, 4:05 PM  Maxcine Ham, M.A. 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 Does the need for close, 24 hr/day medical supervision in concert with the patient's rehab needs make it unreasonable for this patient to be served in a less intensive setting? Yes 2. Co-Morbidities requiring supervision/potential complications: trach, COPD, htn,  3. Due to bladder management, bowel management, safety, skin/wound care, disease management, medication administration, pain management and patient education, does the patient require 24 hr/day rehab nursing? Yes 4. Does the patient require coordinated care of a physician, rehab nurse, PT (1-2 hrs/day, 5 days/week), OT (1-2 hrs/day, 5 days/week) and SLP (1-2 hrs/day, 5 days/week) to address physical and functional deficits in the context of the above medical diagnosis(es)? Yes Addressing deficits in the following areas: balance, endurance, locomotion, strength, transferring, bowel/bladder control, bathing, dressing, feeding, grooming, toileting, speech, swallowing and psychosocial support 5. Can the patient actively participate in an intensive therapy program of at least 3 hrs of  therapy per day at least 5 days per week? Yes 6. The potential for patient to make measurable gains while on inpatient rehab is excellent 7. Anticipated functional outcomes upon discharge from inpatient rehab are modified independent and supervision  with PT, modified independent, supervision and min assist with OT, modified independent with SLP. 8. Estimated rehab length of stay to reach the above functional goals is: 13-20 days 9. Anticipated D/C setting: Home 10. Anticipated post D/C treatments: HH therapy and Outpatient therapy 11. Overall Rehab/Functional Prognosis: excellent  RECOMMENDATIONS: This patient's condition is appropriate for continued rehabilitative care in the following setting: CIR Patient has agreed to participate in recommended program. Yes Note that insurance prior authorization may be required for reimbursement for recommended care.  Comment: Rehab Admissions Coordinator to follow up. Ordered bilateral PRAFO's to support feet/ankles and help prevent skin preakdown  Thanks,  Ranelle Oyster, MD, [place] [facility]    Mcarthur Rossetti Angiulli, PA-C 10/26/2017

## 2017-10-26 NOTE — Plan of Care (Signed)
Remove trach friday

## 2017-10-26 NOTE — Progress Notes (Signed)
Orthopedic Tech Progress Note Patient Details:  Sally [name] 1950-10-15 [id number] Patient already has prafo boots. Patient ID: [name], female   DOB: [birth year], 67 y.o.   MRN: [id number]   [name] 10/26/2017, 11:50 AM

## 2017-10-27 ENCOUNTER — Inpatient Hospital Stay (HOSPITAL_COMMUNITY)
Admission: RE | Admit: 2017-10-27 | Discharge: 2017-11-17 | DRG: 092 | Disposition: A | Discharge status: Home Health | Payer: Medicare Other | Source: Intra-hospital | Attending: Physical Medicine & Rehabilitation | Admitting: Physical Medicine & Rehabilitation

## 2017-10-27 DIAGNOSIS — Z90710 Acquired absence of both cervix and uterus: Secondary | ICD-10-CM | POA: Diagnosis not present

## 2017-10-27 DIAGNOSIS — Z7982 Long term (current) use of aspirin: Secondary | ICD-10-CM | POA: Diagnosis not present

## 2017-10-27 DIAGNOSIS — M21372 Foot drop, left foot: Secondary | ICD-10-CM | POA: Diagnosis present

## 2017-10-27 DIAGNOSIS — J449 Chronic obstructive pulmonary disease, unspecified: Secondary | ICD-10-CM | POA: Diagnosis present

## 2017-10-27 DIAGNOSIS — J42 Unspecified chronic bronchitis: Secondary | ICD-10-CM | POA: Diagnosis not present

## 2017-10-27 DIAGNOSIS — Z79899 Other long term (current) drug therapy: Secondary | ICD-10-CM

## 2017-10-27 DIAGNOSIS — Z930 Tracheostomy status: Secondary | ICD-10-CM

## 2017-10-27 DIAGNOSIS — G629 Polyneuropathy, unspecified: Secondary | ICD-10-CM | POA: Diagnosis present

## 2017-10-27 DIAGNOSIS — F411 Generalized anxiety disorder: Secondary | ICD-10-CM | POA: Diagnosis present

## 2017-10-27 DIAGNOSIS — D649 Anemia, unspecified: Secondary | ICD-10-CM | POA: Diagnosis present

## 2017-10-27 DIAGNOSIS — I10 Essential (primary) hypertension: Secondary | ICD-10-CM | POA: Diagnosis present

## 2017-10-27 DIAGNOSIS — D62 Acute posthemorrhagic anemia: Secondary | ICD-10-CM | POA: Diagnosis not present

## 2017-10-27 DIAGNOSIS — G6281 Critical illness polyneuropathy: Secondary | ICD-10-CM | POA: Diagnosis not present

## 2017-10-27 DIAGNOSIS — F4321 Adjustment disorder with depressed mood: Secondary | ICD-10-CM | POA: Diagnosis present

## 2017-10-27 DIAGNOSIS — D72829 Elevated white blood cell count, unspecified: Secondary | ICD-10-CM | POA: Diagnosis present

## 2017-10-27 DIAGNOSIS — B373 Candidiasis of vulva and vagina: Secondary | ICD-10-CM | POA: Diagnosis present

## 2017-10-27 DIAGNOSIS — F17200 Nicotine dependence, unspecified, uncomplicated: Secondary | ICD-10-CM | POA: Diagnosis present

## 2017-10-27 DIAGNOSIS — M7989 Other specified soft tissue disorders: Secondary | ICD-10-CM | POA: Diagnosis not present

## 2017-10-27 DIAGNOSIS — Z7409 Other reduced mobility: Secondary | ICD-10-CM | POA: Diagnosis present

## 2017-10-27 DIAGNOSIS — T380X5A Adverse effect of glucocorticoids and synthetic analogues, initial encounter: Secondary | ICD-10-CM | POA: Diagnosis present

## 2017-10-27 DIAGNOSIS — J9382 Other air leak: Secondary | ICD-10-CM | POA: Diagnosis present

## 2017-10-27 DIAGNOSIS — G7281 Critical illness myopathy: Secondary | ICD-10-CM | POA: Diagnosis present

## 2017-10-27 DIAGNOSIS — Z9889 Other specified postprocedural states: Secondary | ICD-10-CM

## 2017-10-27 DIAGNOSIS — I82409 Acute embolism and thrombosis of unspecified deep veins of unspecified lower extremity: Secondary | ICD-10-CM

## 2017-10-27 DIAGNOSIS — I82402 Acute embolism and thrombosis of unspecified deep veins of left lower extremity: Secondary | ICD-10-CM | POA: Diagnosis not present

## 2017-10-27 DIAGNOSIS — M21371 Foot drop, right foot: Secondary | ICD-10-CM | POA: Diagnosis present

## 2017-10-27 DIAGNOSIS — I82492 Acute embolism and thrombosis of other specified deep vein of left lower extremity: Secondary | ICD-10-CM | POA: Diagnosis present

## 2017-10-27 DIAGNOSIS — Z86718 Personal history of other venous thrombosis and embolism: Secondary | ICD-10-CM | POA: Diagnosis not present

## 2017-10-27 DIAGNOSIS — J410 Simple chronic bronchitis: Secondary | ICD-10-CM | POA: Diagnosis not present

## 2017-10-27 LAB — CBC WITH DIFFERENTIAL/PLATELET
Basophils Absolute: 0 10*3/uL (ref 0.0–0.1)
Basophils Relative: 0 %
Eosinophils Absolute: 0.1 10*3/uL (ref 0.0–0.7)
Eosinophils Relative: 1 %
HCT: 36.4 % (ref 36.0–46.0)
Hemoglobin: 11.7 g/dL — ABNORMAL LOW (ref 12.0–15.0)
Lymphocytes Relative: 38 %
Lymphs Abs: 5.2 10*3/uL — ABNORMAL HIGH (ref 0.7–4.0)
MCH: 31.7 pg (ref 26.0–34.0)
MCHC: 32.1 g/dL (ref 30.0–36.0)
MCV: 98.6 fL (ref 78.0–100.0)
Monocytes Absolute: 1 10*3/uL (ref 0.1–1.0)
Monocytes Relative: 7 %
Neutro Abs: 7.4 10*3/uL (ref 1.7–7.7)
Neutrophils Relative %: 54 %
Platelets: 424 10*3/uL — ABNORMAL HIGH (ref 150–400)
RBC: 3.69 MIL/uL — ABNORMAL LOW (ref 3.87–5.11)
RDW: 17.9 % — ABNORMAL HIGH (ref 11.5–15.5)
WBC: 13.7 10*3/uL — ABNORMAL HIGH (ref 4.0–10.5)

## 2017-10-27 MED ORDER — BUDESONIDE 0.5 MG/2ML IN SUSP
0.5000 mg | Freq: Two times a day (BID) | RESPIRATORY_TRACT | Status: DC
  Administered 2017-10-28 – 2017-11-17 (×38): 0.5 mg via RESPIRATORY_TRACT
  Filled 2017-10-27 (×41): qty 2

## 2017-10-27 MED ORDER — HEPARIN SODIUM (PORCINE) 5000 UNIT/ML IJ SOLN
5000.0000 [IU] | Freq: Three times a day (TID) | INTRAMUSCULAR | Status: DC
  Administered 2017-10-27 – 2017-11-17 (×62): 5000 [IU] via SUBCUTANEOUS
  Filled 2017-10-27 (×62): qty 1

## 2017-10-27 MED ORDER — ONDANSETRON HCL 4 MG/2ML IJ SOLN: 4.0000 mg | Freq: Four times a day (QID) | INTRAMUSCULAR | Status: DC | PRN

## 2017-10-27 MED ORDER — PREDNISONE 5 MG PO TABS
5.0000 mg | ORAL_TABLET | Freq: Every day | ORAL | Status: AC
  Administered 2017-10-31 – 2017-11-04 (×5): 5 mg via ORAL
  Filled 2017-10-27 (×5): qty 1

## 2017-10-27 MED ORDER — DILTIAZEM HCL 30 MG PO TABS
30.0000 mg | ORAL_TABLET | Freq: Three times a day (TID) | ORAL | Status: DC
  Administered 2017-10-27 – 2017-11-17 (×61): 30 mg via ORAL
  Filled 2017-10-27 (×64): qty 1

## 2017-10-27 MED ORDER — ACETAMINOPHEN 500 MG PO TABS
500.0000 mg | ORAL_TABLET | Freq: Three times a day (TID) | ORAL | Status: DC
  Administered 2017-10-27 – 2017-11-17 (×60): 500 mg via ORAL
  Filled 2017-10-27 (×60): qty 1

## 2017-10-27 MED ORDER — HEPARIN SODIUM (PORCINE) 5000 UNIT/ML IJ SOLN: 5000.0000 [IU] | Freq: Three times a day (TID) | INTRAMUSCULAR | Status: DC

## 2017-10-27 MED ORDER — FLUCONAZOLE 150 MG PO TABS: 150.0000 mg | ORAL_TABLET | ORAL | Status: DC

## 2017-10-27 MED ORDER — GI COCKTAIL ~~LOC~~: 30.0000 mL | Freq: Three times a day (TID) | ORAL | Status: DC | PRN

## 2017-10-27 MED ORDER — HYDROCODONE-ACETAMINOPHEN 7.5-325 MG PO TABS
1.0000 | ORAL_TABLET | Freq: Four times a day (QID) | ORAL | Status: DC | PRN
  Administered 2017-10-27 – 2017-11-17 (×40): 1 via ORAL
  Filled 2017-10-27 (×43): qty 1

## 2017-10-27 MED ORDER — PREDNISONE 5 MG PO TABS
10.0000 mg | ORAL_TABLET | Freq: Every day | ORAL | Status: AC
  Administered 2017-10-28 – 2017-10-30 (×3): 10 mg via ORAL
  Filled 2017-10-27 (×3): qty 2

## 2017-10-27 MED ORDER — LORAZEPAM 0.5 MG PO TABS
0.5000 mg | ORAL_TABLET | ORAL | Status: DC | PRN
  Administered 2017-10-28 – 2017-11-17 (×19): 0.5 mg via ORAL
  Filled 2017-10-27 (×19): qty 1

## 2017-10-27 MED ORDER — ARFORMOTEROL TARTRATE 15 MCG/2ML IN NEBU
15.0000 ug | INHALATION_SOLUTION | Freq: Two times a day (BID) | RESPIRATORY_TRACT | Status: DC
  Administered 2017-10-28 – 2017-11-17 (×38): 15 ug via RESPIRATORY_TRACT
  Filled 2017-10-27 (×40): qty 2

## 2017-10-27 MED ORDER — ONDANSETRON HCL 4 MG PO TABS
4.0000 mg | ORAL_TABLET | Freq: Four times a day (QID) | ORAL | Status: DC | PRN
  Administered 2017-11-13: 4 mg via ORAL
  Filled 2017-10-27: qty 1

## 2017-10-27 MED ORDER — FAMOTIDINE 20 MG PO TABS
20.0000 mg | ORAL_TABLET | Freq: Two times a day (BID) | ORAL | Status: DC
  Administered 2017-10-27 – 2017-11-17 (×42): 20 mg via ORAL
  Filled 2017-10-27 (×42): qty 1

## 2017-10-27 MED ORDER — ASPIRIN 81 MG PO CHEW
81.0000 mg | CHEWABLE_TABLET | Freq: Every day | ORAL | Status: DC
  Administered 2017-10-28 – 2017-11-17 (×21): 81 mg via ORAL
  Filled 2017-10-27 (×21): qty 1

## 2017-10-27 MED ORDER — NICOTINE 21 MG/24HR TD PT24
21.0000 mg | MEDICATED_PATCH | Freq: Every day | TRANSDERMAL | Status: DC
  Administered 2017-10-28 – 2017-11-17 (×21): 21 mg via TRANSDERMAL
  Filled 2017-10-27 (×21): qty 1

## 2017-10-27 MED ORDER — ACETAMINOPHEN 325 MG PO TABS: 650.0000 mg | ORAL_TABLET | Freq: Four times a day (QID) | ORAL | Status: DC | PRN

## 2017-10-27 NOTE — Progress Notes (Signed)
Pt decannulated per MD order.  Trach removed and dressing applied.  Pt tolerated well.  RT will continue to monitor.

## 2017-10-27 NOTE — Progress Notes (Signed)
Pt arrived to floor at 1830

## 2017-10-27 NOTE — Progress Notes (Signed)
Rehab admissions - I met with patient, her husband and her daughter at the bedside.  She would like inpatient rehab admission prior to home.  I faxed clinicals and opened the case yesterday. I just got approval from insurance case manager for acute inpatient rehab admission.  I will contact attending MD to be sure we can admit to inpatient rehab today.  I do have a rehab bed available today.  Call me for questions.  #[phone]

## 2017-10-27 NOTE — H&P (Signed)
Physical Medicine and Rehabilitation Admission H&P       Chief Complaint  Patient presents with  . Respiratory Distress  : HPI: Sally [name] a 67 y.o.right handedfemalewith history of hypertension, severe COPD with tobacco abuse. Per chart review patient lives with spouse. Husband can assist as needed as well as daughter. Reported to be independent prior to admission. Presented 09/26/2017 with increasing shortness of breath and cough she failed BiPAP and required intubation.WBC 21,900.Placed on broad-spectrum antibiotics.hospital course prolonged intubation requiring tracheostomy tube 10/10/2017 downsize to a #4 cuffless and  decannulated 10/27/2017.Pneumothorax requiring chest tube that was removed 10/13/2017. Currently on a regular diet. Completed a 5 day course of Zosyn for right lower lobe infiltrate. Subcutaneous heparin for DVT prophylaxis.Fitted with bilateral PRAFO boots. Acute on chronic anemia 10.9 and monitored. Physical occupational therapy evaluations completed 10/14/2017 with recommendations of physical medicine rehabilitation consult. Patient was admitted for a comprehensive rehabilitation program.    Review of Systems  Constitutional: Negative for chills and fever.  HENT: Negative for hearing loss.   Eyes: Negative for double vision.  Respiratory: Positive for cough and shortness of breath.   Cardiovascular: Negative for chest pain.  Gastrointestinal: Negative for nausea and vomiting.  Genitourinary: Negative for dysuria.  Musculoskeletal: Positive for back pain.  Skin: Negative for rash.  Neurological: Negative for dizziness and headaches.  Psychiatric/Behavioral: Negative for depression.       Past Medical History:  Diagnosis Date  . Asthma   . Back pain   . COPD (chronic obstructive pulmonary disease) ([place])   . Crohn disease ([place])   . Hypertension         Past Surgical History:  Procedure Laterality Date  . ABDOMINAL HYSTERECTOMY     . CATARACT EXTRACTION, BILATERAL    . FRACTURE SURGERY Left    ARM        Family History  Problem Relation Age of Onset  . Hypertension Other    Social History:  reports that she has been smoking.  she has never used smokeless tobacco. She reports that she does not drink alcohol or use drugs. Allergies: No Known Allergies       Medications Prior to Admission  Medication Sig Dispense Refill  . albuterol (PROVENTIL) (2.5 MG/3ML) 0.083% nebulizer solution Take 2.5 mg by nebulization every 6 (six) hours as needed for wheezing or shortness of breath.   5  . [EXPIRED] cefdinir (OMNICEF) 300 MG capsule Take 300 mg by mouth 2 (two) times daily.    . methylPREDNISolone (MEDROL DOSEPAK) 4 MG TBPK tablet Take by mouth.    Marland Kitchen albuterol (PROVENTIL HFA;VENTOLIN HFA) 108 (90 Base) MCG/ACT inhaler Inhale 1-2 puffs into the lungs every 6 (six) hours as needed for wheezing or shortness of breath.    Jearl Klinefelter ELLIPTA 62.5-25 MCG/INH AEPB Inhale 1 puff into the lungs daily.   11  . aspirin EC 81 MG tablet Take 81 mg by mouth daily.    Marland Kitchen atorvastatin (LIPITOR) 20 MG tablet Take 20 mg by mouth at bedtime.    Marland Kitchen DILT-XR 240 MG 24 hr capsule Take 240 mg by mouth daily.  11  . DULoxetine (CYMBALTA) 30 MG capsule Take 1 capsule (30 mg total) by mouth daily. 30 capsule 3  . FLOVENT HFA 110 MCG/ACT inhaler Inhale 2 puffs into the lungs every 12 (twelve) hours.   11  . hydrOXYzine (VISTARIL) 25 MG capsule Take 1 capsule (25 mg total) by mouth 3 (three) times daily as needed for  anxiety. 30 capsule 2  . predniSONE (DELTASONE) 20 MG tablet Take 2 tablets (40 mg total) by mouth daily with breakfast. 6 tablet 0    Drug Regimen Review Drug regimen was reviewed and remains appropriate with no significant issues identified  Home: Home Living Family/patient expects to be discharged to:: Unsure Living Arrangements: Spouse/significant other Additional Comments: Pt with trach in place, no family  caregivers to provide any information   Functional History: Prior Function Comments: unsure  Functional Status:  Mobility: Bed Mobility Overal bed mobility: Needs Assistance Bed Mobility: Supine to Sit Rolling: +2 for physical assistance, Mod assist Sidelying to sit: +2 for physical assistance, Mod assist Supine to sit: +2 for physical assistance, Mod assist, Min assist Sit to supine: Min assist General bed mobility comments: Assisted with LEs to EOB.  Pt pulled up on PT with mod assist.  Transfers Overall transfer level: Needs assistance Equipment used: Ambulation equipment used Transfer via Lift Equipment: Marketing executive Transfers: Sit to/from Stand Sit to Stand: +2 safety/equipment, Max assist, Mod assist General transfer comment: Used Clarise Cruz plus to stand pt with pt assisting movement.  Pt needed intermittent cues to activate hip extension and trunk extension as she seemed to do better than last treatment.  Pt stood 6 min.  Pt was able to weight shift left and right and lift foot up off of the platform.  Pt did buckle a few times but knee plate was in place.  Pt also states her feet feel "tingly" today as opposed to previously no feeling.  Encouraged nursing to use the Clarise Cruz plus for all transfers so that pt could stand more during the day and they agree.    ADL: ADL Overall ADL's : Needs assistance/impaired Eating/Feeding: Set up, Supervision/ safety, Bed level Grooming: Wash/dry hands, Wash/dry face, Bed level, Set up Toileting- Clothing Manipulation and Hygiene: Total assistance, Bed level General ADL Comments: Pt with high anxiety and fear of falling.   Cognition: Cognition Overall Cognitive Status: Within Functional Limits for tasks assessed Orientation Level: Oriented X4 Cognition Arousal/Alertness: Awake/alert Behavior During Therapy: Anxious Overall Cognitive Status: Within Functional Limits for tasks assessed General Comments: pt talking to communicate, reports her  husband can handle her at home Difficult to assess due to: (trach capped)  Physical Exam: Blood pressure (!) 153/30, pulse 92, temperature 97.6 F (36.4 C), temperature source Oral, resp. rate 16, height _0  (1.549 m), weight 69.3 kg (152 lb 12.5 oz), SpO2 97 %. Physical Exam  Constitutional: She is oriented to person, place, and time. She appears distressed.  Frail appearing  HENT:  Head: Normocephalic and atraumatic.  Eyes: Pupils are equal, round, and reactive to light.  Neck: Normal range of motion.  Trach stoma dressed. Air leaking through when not compressing opening with fingers. Voice still quite hoarse. Intelligible when occluding stoma with fingers  Cardiovascular: Normal rate. Exam reveals no gallop and no friction rub.  No murmur heard. Respiratory: Effort normal. No respiratory distress. She has no wheezes.  Musculoskeletal: She exhibits no edema.  Low back tender to palpation  Neurological: She is alert and oriented to person, place, and time. No cranial nerve deficit.  UE motor grossly 3+ to 4/5 prox to distal. LE: 2/5 HF and KE, 3/5 APF, 0/5 ADF bilaterally. Decreased sensation bilateral feet. DTR's 1+  Skin: Skin is warm.  Psychiatric: Judgment and thought content normal.    LabResultsLast48Hours        Results for orders placed or performed during the hospital encounter  of 09/26/17 (from the past 48 hour(s))  Glucose, capillary     Status: Abnormal   Collection Time: 10/25/17 12:23 PM  Result Value Ref Range   Glucose-Capillary 141 (H) 65 - 99 mg/dL  Glucose, capillary     Status: Abnormal   Collection Time: 10/25/17  4:28 PM  Result Value Ref Range   Glucose-Capillary 131 (H) 65 - 99 mg/dL  Renal function panel     Status: Abnormal   Collection Time: 10/26/17  4:58 AM  Result Value Ref Range   Sodium 138 135 - 145 mmol/L   Potassium 3.7 3.5 - 5.1 mmol/L   Chloride 102 101 - 111 mmol/L   CO2 26 22 - 32 mmol/L   Glucose, Bld 87 65 - 99  mg/dL   BUN 15 6 - 20 mg/dL   Creatinine, Ser 0.44 0.44 - 1.00 mg/dL   Calcium 8.8 (L) 8.9 - 10.3 mg/dL   Phosphorus 4.4 2.5 - 4.6 mg/dL   Albumin 2.9 (L) 3.5 - 5.0 g/dL   GFR calc non Af Amer >60 >60 mL/min   GFR calc Af Amer >60 >60 mL/min    Comment: (NOTE) The eGFR has been calculated using the CKD EPI equation. This calculation has not been validated in all clinical situations. eGFR's persistently <60 mL/min signify possible Chronic Kidney Disease.    Anion gap 10 5 - 15  Magnesium     Status: None   Collection Time: 10/26/17  4:58 AM  Result Value Ref Range   Magnesium 2.0 1.7 - 2.4 mg/dL  CBC with Differential/Platelet     Status: Abnormal (Preliminary result)   Collection Time: 10/27/17  4:55 AM  Result Value Ref Range   WBC 13.7 (H) 4.0 - 10.5 K/uL   RBC 3.69 (L) 3.87 - 5.11 MIL/uL   Hemoglobin 11.7 (L) 12.0 - 15.0 g/dL   HCT 36.4 36.0 - 46.0 %   MCV 98.6 78.0 - 100.0 fL   MCH 31.7 26.0 - 34.0 pg   MCHC 32.1 30.0 - 36.0 g/dL   RDW 17.9 (H) 11.5 - 15.5 %   Platelets 424 (H) 150 - 400 K/uL    Comment: Performed at [facility], [place] [address], [place], [place] [address]   Neutrophils Relative % PENDING %   Neutro Abs PENDING 1.7 - 7.7 K/uL   Band Neutrophils PENDING %   Lymphocytes Relative PENDING %   Lymphs Abs PENDING 0.7 - 4.0 K/uL   Monocytes Relative PENDING %   Monocytes Absolute PENDING 0.1 - 1.0 K/uL   Eosinophils Relative PENDING %   Eosinophils Absolute PENDING 0.0 - 0.7 K/uL   Basophils Relative PENDING %   Basophils Absolute PENDING 0.0 - 0.1 K/uL   WBC Morphology PENDING    RBC Morphology PENDING    Smear Review PENDING    nRBC PENDING 0 /100 WBC   Metamyelocytes Relative PENDING %   Myelocytes PENDING %   Promyelocytes Absolute PENDING %   Blasts PENDING %      ImagingResults(Last48hours)  Dg Swallowing Func-speech Pathology  Result Date: 10/25/2017 Objective Swallowing  Evaluation: Type of Study: MBS-Modified Barium Swallow Study  Patient Details Name: Symphany Fleissner MRN: [id number] Date of Birth: 09-20-1951 Today's Date: 10/25/2017 Time: SLP Start Time (ACUTE ONLY): 1410 -SLP Stop Time (ACUTE ONLY): 1430 SLP Time Calculation (min) (ACUTE ONLY): 20 min Past Medical History: Past Medical History: Diagnosis Date . Asthma  . Back pain  . COPD (chronic obstructive pulmonary disease) ([place])  .  Crohn disease ([place])  . Hypertension  Past Surgical History: Past Surgical History: Procedure Laterality Date . ABDOMINAL HYSTERECTOMY   . CATARACT EXTRACTION, BILATERAL   . FRACTURE SURGERY Left   ARM HPI: 67 year old female with a past medical history significant for severe COPDcame to the emergency department on 09/26/2017 complaining of shortness of breath. Intubated on arrival. Two failed extubations: 1/8 and 1/11. Trach placed 1/15.  Subjective: Alert, calm, and cooperative during MBS Assessment / Plan / Recommendation CHL IP CLINICAL IMPRESSIONS 10/25/2017 Clinical Impression Pt presents with mild oropharyngeal dysphagia, with improved swallow function compared to previous MBS completed (1/25). Pt observed to have difficulty managing mixed consistencies, as indicated by X1 penetration during pill and thin liquid trial. Min verbal cues were used to clear throat and subsequently swallow to reduce penetrates left in laryngeal vestibule. However, pt consumed all other thin liquid trials without airway compromise. Pt's oral phase has improved, with only persistent deficit being slightly increased mastication and bolus formation time, likely consistent with baseline given lack of dentition. Given improved pharyngeal swallow, mild oral deficits, and pt's reports of globus sensation with some solids, recommend Dysphagia 3 (mech soft) and thin liquid diet. Pt requiring intermittent supervision to ensure aspiration precautions. Recommend medications administered whole in puree. SLP will continue to follow  to ensure safety with diet and monitor for possible solid texture advancement.  SLP Visit Diagnosis Dysphagia, oropharyngeal phase (R13.12) Attention and concentration deficit following -- Frontal lobe and executive function deficit following -- Impact on safety and function Mild aspiration risk   CHL IP TREATMENT RECOMMENDATION 10/25/2017 Treatment Recommendations Therapy as outlined in treatment plan below   Prognosis 10/25/2017 Prognosis for Safe Diet Advancement Good Barriers to Reach Goals Other (Comment) Barriers/Prognosis Comment -- CHL IP DIET RECOMMENDATION 10/25/2017 SLP Diet Recommendations Dysphagia 3 (Mech soft) solids;Thin liquid Liquid Administration via Cup;Straw Medication Administration Whole meds with puree Compensations Slow rate;Small sips/bites;Minimize environmental distractions Postural Changes Seated upright at 90 degrees   CHL IP OTHER RECOMMENDATIONS 10/25/2017 Recommended Consults -- Oral Care Recommendations Oral care BID Other Recommendations --   CHL IP FOLLOW UP RECOMMENDATIONS 10/25/2017 Follow up Recommendations Inpatient Rehab   CHL IP FREQUENCY AND DURATION 10/25/2017 Speech Therapy Frequency (ACUTE ONLY) min 2x/week Treatment Duration 2 weeks      CHL IP ORAL PHASE 10/25/2017 Oral Phase Impaired Oral - Pudding Teaspoon -- Oral - Pudding Cup -- Oral - Honey Teaspoon -- Oral - Honey Cup -- Oral - Nectar Teaspoon NT Oral - Nectar Cup NT Oral - Nectar Straw NT Oral - Thin Teaspoon NT Oral - Thin Cup Premature spillage Oral - Thin Straw Premature spillage Oral - Puree NT Oral - Mech Soft -- Oral - Regular Decreased bolus cohesion Oral - Multi-Consistency -- Oral - Pill Reduced posterior propulsion Oral Phase - Comment --  CHL IP PHARYNGEAL PHASE 10/25/2017 Pharyngeal Phase Impaired Pharyngeal- Pudding Teaspoon -- Pharyngeal -- Pharyngeal- Pudding Cup -- Pharyngeal -- Pharyngeal- Honey Teaspoon -- Pharyngeal -- Pharyngeal- Honey Cup -- Pharyngeal -- Pharyngeal- Nectar Teaspoon NT Pharyngeal --  Pharyngeal- Nectar Cup NT Pharyngeal -- Pharyngeal- Nectar Straw NT Pharyngeal -- Pharyngeal- Thin Teaspoon NT Pharyngeal -- Pharyngeal- Thin Cup WFL Pharyngeal -- Pharyngeal- Thin Straw Pharyngeal residue - valleculae;Penetration/Aspiration during swallow Pharyngeal Material enters airway, remains ABOVE vocal cords and not ejected out Pharyngeal- Puree NT Pharyngeal -- Pharyngeal- Mechanical Soft -- Pharyngeal -- Pharyngeal- Regular WFL Pharyngeal -- Pharyngeal- Multi-consistency -- Pharyngeal -- Pharyngeal- Pill Other (Comment) Pharyngeal -- Pharyngeal Comment --  CHL  IP CERVICAL ESOPHAGEAL PHASE 10/25/2017 Cervical Esophageal Phase WFL Pudding Teaspoon -- Pudding Cup -- Honey Teaspoon -- Honey Cup -- Nectar Teaspoon -- Nectar Cup -- Nectar Straw -- Thin Teaspoon -- Thin Cup -- Thin Straw -- Puree -- Mechanical Soft -- Regular -- Multi-consistency -- Pill -- Cervical Esophageal Comment -- No flowsheet data found. Germain Osgood 10/25/2017, 4:05 PM  Germain Osgood, M.A. CCC-SLP [phone]                  Medical Problem List and Plan: 1.  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 Tracheostomy 10/10/2017-decannulated 10/27/2017             -admit to inpatient rehab today             -PRAFO's for bilateral ankles  2.  DVT Prophylaxis/Anticoagulation: Subcutaneous heparin. Check vascular study 3. Pain Management: Hydrocodone as needed.             -schedule tylenol bid             -kpad 4. Mood: Ativan 0.5 mg every 4 hours as needed .Provide emotional support 5. Neuropsych: This patient is capable of making decisions on her own behalf. 6. Skin/Wound Care: Routine skin checks 7. Fluids/Electrolytes/Nutrition: Routine I&O's with follow-up chemistries 8. Severe COPD with tobacco abuse. Continue nebulizers. Provide counseling regarding cessation of nicotine products. NicoDerm patch  as well as prednisone taper 9. Right-sided pneumothorax. Chest tube removed 10/13/2017 10. S/p Trach: decannulated. Continue compressive dressing daily     Post Admission Physician Evaluation: 1. Functional deficits secondary  to critical illness neuropathy/myopathy. 2. Patient is admitted to receive collaborative, interdisciplinary care between the physiatrist, rehab nursing staff, and therapy team. 3. Patient's level of medical complexity and substantial therapy needs in context of that medical necessity cannot be provided at a lesser intensity of care such as a SNF. 4. Patient has experienced substantial functional loss from his/her baseline which was documented above under the "Functional History" and "Functional Status" headings.  Judging by the patient's diagnosis, physical exam, and functional history, the patient has potential for functional progress which will result in measurable gains while on inpatient rehab.  These gains will be of substantial and practical use upon discharge  in facilitating mobility and self-care at the household level. 5. Physiatrist will provide 24 hour management of medical needs as well as oversight of the therapy plan/treatment and provide guidance as appropriate regarding the interaction of the two. 6. The Preadmission Screening has been reviewed and patient status is unchanged unless otherwise stated above. 7. 24 hour rehab nursing will assist with bladder management, bowel management, safety, skin/wound care, disease management, medication administration, pain management and patient education  and help integrate therapy concepts, techniques,education, etc. 8. PT will assess and treat for/with: Lower extremity strength, range of motion, stamina, balance, functional mobility, safety, adaptive techniques and equipment, NMR, orthotics, pain control, ego support.   Goals are: supervision. 9. OT will assess and treat for/with: ADL's, functional mobility, safety,  upper extremity strength, adaptive techniques and equipment, NMR, pain control, community reentry, family ed.   Goals are: mod I to supervision with OT. Therapy may proceed with showering this patient. 10. SLP will assess and treat for/with: n/a.  Goals are: n/a. 11. Case Management and Social Worker will assess and treat for psychological issues and discharge planning. 12. Team conference will be held weekly to assess progress toward goals and to determine barriers  to discharge. 13. Patient will receive at least 3 hours of therapy per day at least 5 days per week. 14. ELOS: 13-20 days       15. Prognosis:  excellent     Meredith Staggers, MD, [facility] 10/27/2017  Lavon Paganini Blythe, PA-C 10/27/2017

## 2017-10-27 NOTE — Progress Notes (Signed)
Nutrition Follow-up  DOCUMENTATION CODES:   Not applicable  INTERVENTION:   Encouraged protein choices at meals to promote recovery. Additional snack of cottage cheese and fruit added to meals per pt preference.  NUTRITION DIAGNOSIS:   Increased nutrient needs related to chronic illness(COPD) as evidenced by estimated needs.  Ongoing  GOAL:   Patient will meet greater than or equal to 90% of their needs  Met  MONITOR:   PO intake, Labs, Weight trends, Skin   ASSESSMENT:   67 yo female with PMH of COPD, HTN, Crohn's DZ, asthma who was admitted on 1/1 with COPD exacerbation, requiring intubation.   1/15 S/P trach  1/16 S/P 10 F small bore tube placed per IR 1/26 Cortrak tube removed; diet advanced to PO, D2 NTL diet 1/31 Diet advanced to regular 2/1 Decannulated  Per SLP note, pt is able to tolerate regular diet if she is cautious and fully masticates her food. SLP plans to follow pt for diet tolerance. Spoke with RN who reports pt is eating well. Pt also reports having a good appetite.  Pt reports that she is eating 75-100% of meals. She reported that her prednisone makes her "hungry enough to eat for two people". Pt has dentures but does not wear them to eat; she prefers "gumming" her food.  Pt reports eating hamburgers, mashed potatoes, sweet potato pie and asked if she could have snacks delivered at 2am. Reports having a sweet tooth and also asked if she could get extra cake/cookies. Pt was informed of snack choices available on the floor.  Pt was encouraged to consume adequate protein for recovery and pt requested an order cottage cheese and fruit be added to her meals to curb her sweet tooth and get more protein. This has been added to her meals.   Will continue to monitor pt for diet tolerance and nutrition status ongoing.   Diet Order:  Diet regular Room service appropriate? Yes; Fluid consistency: Thin  EDUCATION NEEDS:   No education needs have been  identified at this time  Skin:  Skin Assessment: Reviewed RN Assessment  Last BM:  1/30  Height:   Ht Readings from Last 1 Encounters:  09/26/17 5' 1"  (1.549 m)    Weight:   Wt Readings from Last 1 Encounters:  10/26/17 152 lb 12.5 oz (69.3 kg)    Ideal Body Weight:  47.7 kg  BMI:  Body mass index is 28.87 kg/m.  Estimated Nutritional Needs:   Kcal:  1300-1500  Protein:  75-90 gm  Fluid:  1.5 L    Edmonia Lynch, MS Dietetic Intern Pager: [phone]

## 2017-10-27 NOTE — Progress Notes (Signed)
PULMONARY / CRITICAL CARE MEDICINE   Name: Sally [name] MRN: [id number] DOB: [birth year]    ADMISSION DATE:  09/26/2017 CONSULTATION DATE:  1/1  REFERRING MD:  Juleen China (EDP)   CHIEF COMPLAINT:  Dyspnea   HISTORY OF PRESENT ILLNESS:   67 year old female smoker with hx severe COPD presented 1/1 with dyspnea. Failed bipap and was intubated in ER.  Flu A POS.  Prolonged course, difficulty weaning from vent.  Now s/p trach 1/15.    SUBJECTIVE:  Has remained capped x 48 hours with no difficulty. She states she is ready to have the trach removed  VITAL SIGNS: BP (!) 153/30 (BP Location: Left Arm)   Pulse 92   Temp 97.6 F (36.4 C) (Oral)   Resp 16   Ht 5\' 1"  (1.549 m)   Wt 152 lb 12.5 oz (69.3 kg)   SpO2 97%   BMI 28.87 kg/m   HEMODYNAMICS:    VENTILATOR SETTINGS:    INTAKE / OUTPUT: I/O last 3 completed shifts: In: 960 [P.O.:960] Out: 650 [Urine:650]  PHYSICAL EXAMINATION: General: 67 year old female on trach collar breathing comfortably. PM valve in place HEENT: Trach collar in place, #4  cuffless trach in place, no JVD is appreciated Neuro: Moves all extremities x4 , A&O x 3, following commands, no foot drop CV: RRR, Nl S1/S2, -M/R/G PULM: CTA bilaterally, diminished per bases GI: Soft, NT, ND and +BS, obese Extremities: No acute deformity, warm and dry, no mottling, brisk cap refill UE, LE Skin: no rashes or lesions. Grossly intact, warm and dry   LABS:  BMET Recent Labs  Lab 10/22/17 0336 10/25/17 0408 10/26/17 0458  NA 137 138 138  K 3.7 3.6 3.7  CL 100* 103 102  CO2 23 25 26   BUN 14 13 15   CREATININE 0.45 0.50 0.44  GLUCOSE 89 89 87    Electrolytes Recent Labs  Lab 10/22/17 0336 10/25/17 0408 10/26/17 0458  CALCIUM 8.8* 8.8* 8.8*  MG 2.1  --  2.0  PHOS  --  4.6 4.4    CBC Recent Labs  Lab 10/21/17 0437 10/25/17 0408 10/27/17 0455  WBC 13.9* 12.7* 13.7*  HGB 10.9* 10.9* 11.7*  HCT 33.3* 34.2* 36.4  PLT 383 399 424*    Coag's No  results for input(s): APTT, INR in the last 168 hours.  Sepsis Markers No results for input(s): LATICACIDVEN, PROCALCITON, O2SATVEN in the last 168 hours.  ABG No results for input(s): PHART, PCO2ART, PO2ART in the last 168 hours.  Liver Enzymes Recent Labs  Lab 10/26/17 0458  ALBUMIN 2.9*    Cardiac Enzymes No results for input(s): TROPONINI, PROBNP in the last 168 hours.  Glucose Recent Labs  Lab 10/24/17 2016 10/24/17 2342 10/25/17 0331 10/25/17 0751 10/25/17 1223 10/25/17 1628  GLUCAP 129* 143* 91 74 141* 131*    Imaging No results found.   STUDIES:    CULTURES: 1/1 RVP>>> POS flu A  1/1 sputum >>> rare coag neg staph   ANTIBIOTICS:   SIGNIFICANT EVENTS: 10/20/2017 for swallowing eval  LINES/TUBES: ETT 1/1>>>1/15 Trach 1/15>>> Chest tube >>> out 1/18 De-cannulated >>> 10/27/2017    DISCUSSION: 66yo female with respiratory failure r/t AECOPD, flu A , trached 1/15 after unsuccessful extubations.  ASSESSMENT / PLAN:  Respiratory failure:  - Monitor for airway protection  - SLP  - Maintain off the vent             - Maintain sats 88-92%             -  Aggressive pulmonary toilet             - Will need ambulatory saturation prior to D/C             - Will need outpatient follow up>> Sees [facility]  COPD:  - Bronchodilators as ordered             - I have spent 3 minutes counseling patient on smoking cessation this visit.  Trach status:  - Capp cuffless # 4>> x 48 hours with no difficulty  - Decannulate today, 10/27/2017    Bevelyn Ngo, AGACNP-BC [facility] Pulmonary/Critical Care Medicine. Pager: [phone].  Attending Note:  67 year old female with COPD and associated respiratory failure, failed to extubate and was trached during an admission for COPD exacerbation and pneumonia.  On exam, clear bilaterally, trach is capped.  I reviewed CXR myself, trach is in good position.  Discussed with PCCM-NP.  Respiratory  failure:  - Monitor for airway protection  - Aggressive pulmonary toilet  Hypoxemia:  - Ambulatory desat prior to discharge for home O2  - Titrate O2 for sat of 88-92%  COPD:  - Bronchodilators as ordered  Trach status:  - Decannulate today 2/1.  May transfer to CIR.  PCCM will sign off, please call back if needed.  Patient seen and examined, agree with above note.  I dictated the care and orders written for this patient under my direction.  Alyson Reedy, MD [phone]

## 2017-10-27 NOTE — H&P (Signed)
Physical Medicine and Rehabilitation Admission H&P    Chief Complaint  Patient presents with  . Respiratory Distress  : HPI:  Sally [name] is a 67 y.o.right handed female with history of hypertension, severe COPD with tobacco abuse. Per chart review patient lives with spouse. Husband can assist as needed as well as daughter. Reported to be independent prior to admission. Presented 09/26/2017 with increasing shortness of breath and cough she failed BiPAP and required intubation.WBC 21,900.Placed on broad-spectrum antibiotics.hospital course prolonged intubation requiring tracheostomy tube 10/10/2017 downsize to a #4 cuffless and  decannulated 10/27/2017.Pneumothorax requiring chest tube that was removed 10/13/2017. Currently on a regular diet. Completed a 5 day course of Zosyn for right lower lobe infiltrate. Subcutaneous heparin for DVT prophylaxis.Fitted with bilateral PRAFO boots. Acute on chronic anemia 10.9 and monitored. Physical occupational therapy evaluations completed 10/14/2017  with recommendations of physical medicine rehabilitation consult. Patient was admitted for a comprehensive rehabilitation program.    Review of Systems  Constitutional: Negative for chills and fever.  HENT: Negative for hearing loss.   Eyes: Negative for double vision.  Respiratory: Positive for cough and shortness of breath.   Cardiovascular: Negative for chest pain.  Gastrointestinal: Negative for nausea and vomiting.  Genitourinary: Negative for dysuria.  Musculoskeletal: Positive for back pain.  Skin: Negative for rash.  Neurological: Negative for dizziness and headaches.  Psychiatric/Behavioral: Negative for depression.   Past Medical History:  Diagnosis Date  . Asthma   . Back pain   . COPD (chronic obstructive pulmonary disease) ([place])   . Crohn disease ([place])   . Hypertension    Past Surgical History:  Procedure Laterality Date  . ABDOMINAL HYSTERECTOMY    . CATARACT EXTRACTION,  BILATERAL    . FRACTURE SURGERY Left    ARM   Family History  Problem Relation Age of Onset  . Hypertension Other    Social History:  reports that she has been smoking.  she has never used smokeless tobacco. She reports that she does not drink alcohol or use drugs. Allergies: No Known Allergies Medications Prior to Admission  Medication Sig Dispense Refill  . albuterol (PROVENTIL) (2.5 MG/3ML) 0.083% nebulizer solution Take 2.5 mg by nebulization every 6 (six) hours as needed for wheezing or shortness of breath.   5  . [EXPIRED] cefdinir (OMNICEF) 300 MG capsule Take 300 mg by mouth 2 (two) times daily.    . methylPREDNISolone (MEDROL DOSEPAK) 4 MG TBPK tablet Take by mouth.    Marland Kitchen albuterol (PROVENTIL HFA;VENTOLIN HFA) 108 (90 Base) MCG/ACT inhaler Inhale 1-2 puffs into the lungs every 6 (six) hours as needed for wheezing or shortness of breath.    Jearl Klinefelter ELLIPTA 62.5-25 MCG/INH AEPB Inhale 1 puff into the lungs daily.   11  . aspirin EC 81 MG tablet Take 81 mg by mouth daily.    Marland Kitchen atorvastatin (LIPITOR) 20 MG tablet Take 20 mg by mouth at bedtime.    Marland Kitchen DILT-XR 240 MG 24 hr capsule Take 240 mg by mouth daily.  11  . DULoxetine (CYMBALTA) 30 MG capsule Take 1 capsule (30 mg total) by mouth daily. 30 capsule 3  . FLOVENT HFA 110 MCG/ACT inhaler Inhale 2 puffs into the lungs every 12 (twelve) hours.   11  . hydrOXYzine (VISTARIL) 25 MG capsule Take 1 capsule (25 mg total) by mouth 3 (three) times daily as needed for anxiety. 30 capsule 2  . predniSONE (DELTASONE) 20 MG tablet Take 2 tablets (40 mg total) by  mouth daily with breakfast. 6 tablet 0    Drug Regimen Review Drug regimen was reviewed and remains appropriate with no significant issues identified  Home: Home Living Family/patient expects to be discharged to:: Unsure Living Arrangements: Spouse/significant other Additional Comments: Pt with trach in place, no family caregivers to provide any information   Functional  History: Prior Function Comments: unsure  Functional Status:  Mobility: Bed Mobility Overal bed mobility: Needs Assistance Bed Mobility: Supine to Sit Rolling: +2 for physical assistance, Mod assist Sidelying to sit: +2 for physical assistance, Mod assist Supine to sit: +2 for physical assistance, Mod assist, Min assist Sit to supine: Min assist General bed mobility comments: Assisted with LEs to EOB.  Pt pulled up on PT with mod assist.  Transfers Overall transfer level: Needs assistance Equipment used: Ambulation equipment used Transfer via Lift Equipment: Marketing executive Transfers: Sit to/from Stand Sit to Stand: +2 safety/equipment, Max assist, Mod assist General transfer comment: Used Clarise Cruz plus to stand pt with pt assisting movement.  Pt needed intermittent cues to activate hip extension and trunk extension as she seemed to do better than last treatment.  Pt stood 6 min.  Pt was able to weight shift left and right and lift foot up off of the platform.  Pt did buckle a few times but knee plate was in place.  Pt also states her feet feel "tingly" today as opposed to previously no feeling.  Encouraged nursing to use the Clarise Cruz plus for all transfers so that pt could stand more during the day and they agree.        ADL: ADL Overall ADL's : Needs assistance/impaired Eating/Feeding: Set up, Supervision/ safety, Bed level Grooming: Wash/dry hands, Wash/dry face, Bed level, Set up Toileting- Clothing Manipulation and Hygiene: Total assistance, Bed level General ADL Comments: Pt with high anxiety and fear of falling.   Cognition: Cognition Overall Cognitive Status: Within Functional Limits for tasks assessed Orientation Level: Oriented X4 Cognition Arousal/Alertness: Awake/alert Behavior During Therapy: Anxious Overall Cognitive Status: Within Functional Limits for tasks assessed General Comments: pt talking to communicate, reports her husband can handle her at home Difficult to assess  due to: (trach capped)  Physical Exam: Blood pressure (!) 153/30, pulse 92, temperature 97.6 F (36.4 C), temperature source Oral, resp. rate 16, height 5' 1" (1.549 m), weight 69.3 kg (152 lb 12.5 oz), SpO2 97 %. Physical Exam  Constitutional: She is oriented to person, place, and time. She appears distressed.  Frail appearing  HENT:  Head: Normocephalic and atraumatic.  Eyes: Pupils are equal, round, and reactive to light.  Neck: Normal range of motion.  Trach stoma dressed. Air leaking through when not compressing opening with fingers. Voice still quite hoarse. Intelligible when occluding stoma with fingers  Cardiovascular: Normal rate. Exam reveals no gallop and no friction rub.  No murmur heard. Respiratory: Effort normal. No respiratory distress. She has no wheezes.  Musculoskeletal: She exhibits no edema.  Low back tender to palpation  Neurological: She is alert and oriented to person, place, and time. No cranial nerve deficit.  UE motor grossly 3+ to 4/5 prox to distal. LE: 2/5 HF and KE, 3/5 APF, 0/5 ADF bilaterally. Decreased sensation bilateral feet. DTR's 1+  Skin: Skin is warm.  Psychiatric: Judgment and thought content normal.    Results for orders placed or performed during the hospital encounter of 09/26/17 (from the past 48 hour(s))  Glucose, capillary     Status: Abnormal   Collection Time: 10/25/17 12:23  PM  Result Value Ref Range   Glucose-Capillary 141 (H) 65 - 99 mg/dL  Glucose, capillary     Status: Abnormal   Collection Time: 10/25/17  4:28 PM  Result Value Ref Range   Glucose-Capillary 131 (H) 65 - 99 mg/dL  Renal function panel     Status: Abnormal   Collection Time: 10/26/17  4:58 AM  Result Value Ref Range   Sodium 138 135 - 145 mmol/L   Potassium 3.7 3.5 - 5.1 mmol/L   Chloride 102 101 - 111 mmol/L   CO2 26 22 - 32 mmol/L   Glucose, Bld 87 65 - 99 mg/dL   BUN 15 6 - 20 mg/dL   Creatinine, Ser 0.44 0.44 - 1.00 mg/dL   Calcium 8.8 (L) 8.9 - 10.3  mg/dL   Phosphorus 4.4 2.5 - 4.6 mg/dL   Albumin 2.9 (L) 3.5 - 5.0 g/dL   GFR calc non Af Amer >60 >60 mL/min   GFR calc Af Amer >60 >60 mL/min    Comment: (NOTE) The eGFR has been calculated using the CKD EPI equation. This calculation has not been validated in all clinical situations. eGFR's persistently <60 mL/min signify possible Chronic Kidney Disease.    Anion gap 10 5 - 15  Magnesium     Status: None   Collection Time: 10/26/17  4:58 AM  Result Value Ref Range   Magnesium 2.0 1.7 - 2.4 mg/dL  CBC with Differential/Platelet     Status: Abnormal (Preliminary result)   Collection Time: 10/27/17  4:55 AM  Result Value Ref Range   WBC 13.7 (H) 4.0 - 10.5 K/uL   RBC 3.69 (L) 3.87 - 5.11 MIL/uL   Hemoglobin 11.7 (L) 12.0 - 15.0 g/dL   HCT 36.4 36.0 - 46.0 %   MCV 98.6 78.0 - 100.0 fL   MCH 31.7 26.0 - 34.0 pg   MCHC 32.1 30.0 - 36.0 g/dL   RDW 17.9 (H) 11.5 - 15.5 %   Platelets 424 (H) 150 - 400 K/uL    Comment: Performed at [facility], [place] [address] [place], [place] [address]   Neutrophils Relative % PENDING %   Neutro Abs PENDING 1.7 - 7.7 K/uL   Band Neutrophils PENDING %   Lymphocytes Relative PENDING %   Lymphs Abs PENDING 0.7 - 4.0 K/uL   Monocytes Relative PENDING %   Monocytes Absolute PENDING 0.1 - 1.0 K/uL   Eosinophils Relative PENDING %   Eosinophils Absolute PENDING 0.0 - 0.7 K/uL   Basophils Relative PENDING %   Basophils Absolute PENDING 0.0 - 0.1 K/uL   WBC Morphology PENDING    RBC Morphology PENDING    Smear Review PENDING    nRBC PENDING 0 /100 WBC   Metamyelocytes Relative PENDING %   Myelocytes PENDING %   Promyelocytes Absolute PENDING %   Blasts PENDING %   Dg Swallowing Func-speech Pathology  Result Date: 10/25/2017 Objective Swallowing Evaluation: Type of Study: MBS-Modified Barium Swallow Study  Patient Details Name: Sally [name] MRN: [id number] Date of Birth: 06/27/1951 Today's Date: 10/25/2017 Time: SLP Start Time (ACUTE ONLY):  1410 -SLP Stop Time (ACUTE ONLY): 1430 SLP Time Calculation (min) (ACUTE ONLY): 20 min Past Medical History: Past Medical History: Diagnosis Date . Asthma  . Back pain  . COPD (chronic obstructive pulmonary disease) ([place])  . Crohn disease ([place])  . Hypertension  Past Surgical History: Past Surgical History: Procedure Laterality Date . ABDOMINAL HYSTERECTOMY   . CATARACT EXTRACTION, BILATERAL   .  FRACTURE SURGERY Left   ARM HPI: 67 year old female with a past medical history significant for severe COPDcame to the emergency department on 09/26/2017 complaining of shortness of breath. Intubated on arrival. Two failed extubations: 1/8 and 1/11. Trach placed 1/15.  Subjective: Alert, calm, and cooperative during MBS Assessment / Plan / Recommendation CHL IP CLINICAL IMPRESSIONS 10/25/2017 Clinical Impression Pt presents with mild oropharyngeal dysphagia, with improved swallow function compared to previous MBS completed (1/25). Pt observed to have difficulty managing mixed consistencies, as indicated by X1 penetration during pill and thin liquid trial. Min verbal cues were used to clear throat and subsequently swallow to reduce penetrates left in laryngeal vestibule. However, pt consumed all other thin liquid trials without airway compromise. Pt's oral phase has improved, with only persistent deficit being slightly increased mastication and bolus formation time, likely consistent with baseline given lack of dentition. Given improved pharyngeal swallow, mild oral deficits, and pt's reports of globus sensation with some solids, recommend Dysphagia 3 (mech soft) and thin liquid diet. Pt requiring intermittent supervision to ensure aspiration precautions. Recommend medications administered whole in puree. SLP will continue to follow to ensure safety with diet and monitor for possible solid texture advancement.  SLP Visit Diagnosis Dysphagia, oropharyngeal phase (R13.12) Attention and concentration deficit following -- Frontal  lobe and executive function deficit following -- Impact on safety and function Mild aspiration risk   CHL IP TREATMENT RECOMMENDATION 10/25/2017 Treatment Recommendations Therapy as outlined in treatment plan below   Prognosis 10/25/2017 Prognosis for Safe Diet Advancement Good Barriers to Reach Goals Other (Comment) Barriers/Prognosis Comment -- CHL IP DIET RECOMMENDATION 10/25/2017 SLP Diet Recommendations Dysphagia 3 (Mech soft) solids;Thin liquid Liquid Administration via Cup;Straw Medication Administration Whole meds with puree Compensations Slow rate;Small sips/bites;Minimize environmental distractions Postural Changes Seated upright at 90 degrees   CHL IP OTHER RECOMMENDATIONS 10/25/2017 Recommended Consults -- Oral Care Recommendations Oral care BID Other Recommendations --   CHL IP FOLLOW UP RECOMMENDATIONS 10/25/2017 Follow up Recommendations Inpatient Rehab   CHL IP FREQUENCY AND DURATION 10/25/2017 Speech Therapy Frequency (ACUTE ONLY) min 2x/week Treatment Duration 2 weeks      CHL IP ORAL PHASE 10/25/2017 Oral Phase Impaired Oral - Pudding Teaspoon -- Oral - Pudding Cup -- Oral - Honey Teaspoon -- Oral - Honey Cup -- Oral - Nectar Teaspoon NT Oral - Nectar Cup NT Oral - Nectar Straw NT Oral - Thin Teaspoon NT Oral - Thin Cup Premature spillage Oral - Thin Straw Premature spillage Oral - Puree NT Oral - Mech Soft -- Oral - Regular Decreased bolus cohesion Oral - Multi-Consistency -- Oral - Pill Reduced posterior propulsion Oral Phase - Comment --  CHL IP PHARYNGEAL PHASE 10/25/2017 Pharyngeal Phase Impaired Pharyngeal- Pudding Teaspoon -- Pharyngeal -- Pharyngeal- Pudding Cup -- Pharyngeal -- Pharyngeal- Honey Teaspoon -- Pharyngeal -- Pharyngeal- Honey Cup -- Pharyngeal -- Pharyngeal- Nectar Teaspoon NT Pharyngeal -- Pharyngeal- Nectar Cup NT Pharyngeal -- Pharyngeal- Nectar Straw NT Pharyngeal -- Pharyngeal- Thin Teaspoon NT Pharyngeal -- Pharyngeal- Thin Cup WFL Pharyngeal -- Pharyngeal- Thin Straw  Pharyngeal residue - valleculae;Penetration/Aspiration during swallow Pharyngeal Material enters airway, remains ABOVE vocal cords and not ejected out Pharyngeal- Puree NT Pharyngeal -- Pharyngeal- Mechanical Soft -- Pharyngeal -- Pharyngeal- Regular WFL Pharyngeal -- Pharyngeal- Multi-consistency -- Pharyngeal -- Pharyngeal- Pill Other (Comment) Pharyngeal -- Pharyngeal Comment --  CHL IP CERVICAL ESOPHAGEAL PHASE 10/25/2017 Cervical Esophageal Phase WFL Pudding Teaspoon -- Pudding Cup -- Honey Teaspoon -- Honey Cup -- Nectar Teaspoon -- Nectar Cup -- [employer]  Straw -- Thin Teaspoon -- Thin Cup -- Thin Straw -- Puree -- Mechanical Soft -- Regular -- Multi-consistency -- Pill -- Cervical Esophageal Comment -- No flowsheet data found. Germain Osgood 10/25/2017, 4:05 PM  Germain Osgood, M.A. CCC-SLP [phone]                 Medical Problem List and Plan: 1.  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 Tracheostomy 10/10/2017-decannulated 10/27/2017  -admit to inpatient rehab today   -PRAFO's for bilateral ankles  2.  DVT Prophylaxis/Anticoagulation: Subcutaneous heparin. Check vascular study 3. Pain Management: Hydrocodone as needed.   -schedule tylenol bid  -kpad 4. Mood: Ativan 0.5 mg every 4 hours as needed .Provide emotional support 5. Neuropsych: This patient is capable of making decisions on her own behalf. 6. Skin/Wound Care: Routine skin checks 7. Fluids/Electrolytes/Nutrition: Routine I&O's with follow-up chemistries 8. Severe COPD with tobacco abuse. Continue nebulizers. Provide counseling regarding cessation of nicotine products. NicoDerm patch as well as prednisone taper 9. Right-sided pneumothorax. Chest tube removed 10/13/2017 10. S/p Trach: decannulated. Continue compressive dressing daily     Post Admission Physician Evaluation: 1. Functional deficits secondary   to critical illness neuropathy/myopathy. 2. Patient is admitted to receive collaborative, interdisciplinary care between the physiatrist, rehab nursing staff, and therapy team. 3. Patient's level of medical complexity and substantial therapy needs in context of that medical necessity cannot be provided at a lesser intensity of care such as a SNF. 4. Patient has experienced substantial functional loss from his/her baseline which was documented above under the "Functional History" and "Functional Status" headings.  Judging by the patient's diagnosis, physical exam, and functional history, the patient has potential for functional progress which will result in measurable gains while on inpatient rehab.  These gains will be of substantial and practical use upon discharge  in facilitating mobility and self-care at the household level. 5. Physiatrist will provide 24 hour management of medical needs as well as oversight of the therapy plan/treatment and provide guidance as appropriate regarding the interaction of the two. 6. The Preadmission Screening has been reviewed and patient status is unchanged unless otherwise stated above. 7. 24 hour rehab nursing will assist with bladder management, bowel management, safety, skin/wound care, disease management, medication administration, pain management and patient education  and help integrate therapy concepts, techniques,education, etc. 8. PT will assess and treat for/with: Lower extremity strength, range of motion, stamina, balance, functional mobility, safety, adaptive techniques and equipment, NMR, orthotics, pain control, ego support.   Goals are: supervision. 9. OT will assess and treat for/with: ADL's, functional mobility, safety, upper extremity strength, adaptive techniques and equipment, NMR, pain control, community reentry, family ed.   Goals are: mod I to supervision with OT. Therapy may proceed with showering this patient. 10. SLP will assess and treat  for/with: n/a.  Goals are: n/a. 11. Case Management and Social Worker will assess and treat for psychological issues and discharge planning. 12. Team conference will be held weekly to assess progress toward goals and to determine barriers to discharge. 13. Patient will receive at least 3 hours of therapy per day at least 5 days per week. 14. ELOS: 13-20 days       15. Prognosis:  excellent     Meredith Staggers, MD, [facility] 10/27/2017  Lavon Paganini [place], PA-C 10/27/2017

## 2017-10-27 NOTE — Discharge Summary (Signed)
DISCHARGE SUMMARY  Sally [name]  MR#: [id number]  DOB:05/10/1951  Date of Admission: 09/26/2017 Date of Discharge: 10/27/2017  Attending Physician:Jeffrey T McClung, MD   Patient's PCP:Cabeza, Yuri M., MD  Consults:  PCCM  Disposition: D/C to CIR   Follow-up Appts: To be determined at time of D/C from CIR   Discharge Diagnoses: Acuteon chronichypoxic respiratory failure  Influenza A Dysphagia - resolved (regular diet - thin liquids at d/c) COPDwith acuteexacerbation Pneumothorax, Right Iatrogenic during NGT placement.  RLL infiltrate Hypokalemia  GERD Deconditioned   Initial presentation: 66-year-old female with a history of severe COPDand 1PPD ongoing tobacco abuse who presented on September 26, 2017 w/ shortness of breath. She was initially treated with BiPAP but developed worsening respiratory failure and required intubation. Respiratory panel returned influenza A positive.  Hospital Course: 1/1 admitted - intubated  1/15 tracheostomy  1/16 PTX during NG placement attempts > R chest tube 1/18 chest tube removed   1/28 trach downsized to #4 shiley uncuffed 1/30 trach capped 2/1 trach removed   Acuteon chronichypoxic respiratory failure  Progression from intubation > tracheostomy > trach decannulation as outlined above - course was protracted but pt made steady slow progress - on day of d/c she is w/o resp distress and able to speak s/p trach decannulation - discussed w/ PCCM who agrees w/ transfer to CIR today    Influenza A completed a full tx course - asymptomatic   Dysphagia  Felt to be due to weakness of acute illness + presence of trach - SLP followed - resolved at time of d/c w/ pt tolerating regular diet w/ thin liquids    COPDwith acuteexacerbation Care as per PCCM - stabilized - steroids slowly weaned - no wheezing at time of d/c   Pneumothorax, Right - Iatrogenic during NGT placement.  Chest tube removed 1/18 per PCCM - stable on exam -  f/u CXRs stable   RLL infiltrate Was tx w/ 5 days of empiric Zosyn - afebrile   Hypokalemia  Supplemented   GERD   Current Facility-Administered Medications:  .  acetaminophen (TYLENOL) tablet 650 mg, 650 mg, Oral, Q6H PRN, Krishnan, Gokul, MD, 650 mg at 10/23/17 1553 .  arformoterol (BROVANA) nebulizer solution 15 mcg, 15 mcg, Nebulization, BID, Harbrecht, Lawrence, MD, 15 mcg at 10/27/17 0755 .  aspirin chewable tablet 81 mg, 81 mg, Oral, Daily, Krishnan, Gokul, MD, 81 mg at 10/27/17 1028 .  budesonide (PULMICORT) nebulizer solution 0.5 mg, 0.5 mg, Nebulization, BID, Harbrecht, Lawrence, MD, 0.5 mg at 10/27/17 0755 .  chlorhexidine gluconate (MEDLINE KIT) (PERIDEX) 0.12 % solution 15 mL, 15 mL, Mouth Rinse, BID, McQuaid, Douglas B, MD, 15 mL at 10/27/17 0804 .  Chlorhexidine Gluconate Cloth 2 % PADS 6 each, 6 each, Topical, Daily, McQuaid, Douglas B, MD, 6 each at 10/27/17 1029 .  diltiazem (CARDIZEM) tablet 30 mg, 30 mg, Oral, Q8H, Krishnan, Gokul, MD, 30 mg at 10/27/17 1328 .  famotidine (PEPCID) tablet 20 mg, 20 mg, Oral, BID, Krishnan, Gokul, MD, 20 mg at 10/27/17 1028 .  fluconazole (DIFLUCAN) tablet 150 mg, 150 mg, Oral, Weekly, Ogbata, Sylvester I, MD, 150 mg at 10/25/17 1234 .  gi cocktail (Maalox,Lidocaine,Donnatal), 30 mL, Oral, TID PRN, McClung, Jeffrey T, MD .  heparin injection 5,000 Units, 5,000 Units, Subcutaneous, Q8H, McQuaid, Douglas B, MD, 5,000 Units at 10/27/17 1328 .  hydrALAZINE (APRESOLINE) injection 10 mg, 10 mg, Intravenous, Q6H PRN, Winfrey, William B, MD, 10 mg at 10/06/17 2217 .  HYDROcodone-acetaminophen (NORCO) 7.5-325 MG per   tablet 1 tablet, 1 tablet, Oral, Q6H PRN, Bonnielee Haff, MD, 1 tablet at 10/26/17 1451 .  LORazepam (ATIVAN) tablet 0.5 mg, 0.5 mg, Oral, Q4H PRN, Bonnielee Haff, MD, 0.5 mg at 10/27/17 0803 .  MEDLINE mouth rinse, 15 mL, Mouth Rinse, QID, McQuaid, Douglas B, MD, 15 mL at 10/27/17 1329 .  morphine 4 MG/ML injection 1 mg, 1 mg,  Intravenous, Q4H PRN, Bonnielee Haff, MD, 1 mg at 10/21/17 1749 .  nicotine (NICODERM CQ - dosed in mg/24 hours) patch 21 mg, 21 mg, Transdermal, Daily, Kara Mead V, MD, 21 mg at 10/27/17 1029 .  ondansetron (ZOFRAN) injection 4 mg, 4 mg, Intravenous, Q6H PRN, Simonne Maffucci B, MD, 4 mg at 10/17/17 0454 .  [COMPLETED] predniSONE (DELTASONE) tablet 40 mg, 40 mg, Per Tube, Q breakfast, 40 mg at 10/15/17 0800 **FOLLOWED BY** [COMPLETED] predniSONE (DELTASONE) tablet 30 mg, 30 mg, Oral, Q breakfast, 30 mg at 10/20/17 0807 **FOLLOWED BY** [COMPLETED] predniSONE (DELTASONE) tablet 20 mg, 20 mg, Oral, Q breakfast, 20 mg at 10/25/17 0733 **FOLLOWED BY** predniSONE (DELTASONE) tablet 10 mg, 10 mg, Oral, Q breakfast, 10 mg at 10/27/17 0804 **FOLLOWED BY** [START ON 10/31/2017] predniSONE (DELTASONE) tablet 5 mg, 5 mg, Oral, Q breakfast, [name], MD .  senna (SENOKOT) tablet 8.6 mg, 1 tablet, Oral, Daily PRN, Bonnielee Haff, MD .  sodium chloride flush (NS) 0.9 % injection 10-40 mL, 10-40 mL, Intracatheter, PRN, Juanito Doom, MD   Day of Discharge BP (!) 150/69 (BP Location: Right Arm)   Pulse 100   Temp 98.4 F (36.9 C) (Oral)   Resp (!) 25   Ht 5' 1" (1.549 m)   Wt 69.3 kg (152 lb 12.5 oz)   SpO2 99%   BMI 28.87 kg/m   Physical Exam: General: No acute respiratory distress Lungs: Clear to auscultation bilaterally without wheezes or crackles Cardiovascular: Regular rate and rhythm without murmur gallop or rub normal S1 and S2 Abdomen: Nontender, nondistended, soft, bowel sounds positive, no rebound, no ascites, no appreciable mass Extremities: No significant cyanosis, clubbing, or edema bilateral lower extremities  Basic Metabolic Panel: Recent Labs  Lab 10/21/17 0437 10/22/17 0336 10/25/17 0408 10/26/17 0458  NA 137 137 138 138  K 3.1* 3.7 3.6 3.7  CL 100* 100* 103 102  CO2 _0 GLUCOSE 92 89 89 87  BUN _1 CREATININE 0.43* 0.45 0.50 0.44  CALCIUM  8.9 8.8* 8.8* 8.8*  MG  --  2.1  --  2.0  PHOS  --   --  4.6 4.4    Liver Function Tests: Recent Labs  Lab 10/26/17 0458  ALBUMIN 2.9*   CBC: Recent Labs  Lab 10/21/17 0437 10/25/17 0408 10/27/17 0455  WBC 13.9* 12.7* 13.7*  NEUTROABS  --   --  7.4  HGB 10.9* 10.9* 11.7*  HCT 33.3* 34.2* 36.4  MCV 96.0 97.7 98.6  PLT 383 399 424*    CBG: Recent Labs  Lab 10/24/17 2342 10/25/17 0331 10/25/17 0751 10/25/17 1223 10/25/17 1628  GLUCAP 143* 91 74 141* 131*    Time spent in discharge (includes decision making & examination of pt): 35 minutes  10/27/2017, 6:05 PM   [name], MD [facility] Office  [phone] Pager [phone]  On-Call/Text Page:      [email or web address]      password [facility]

## 2017-10-27 NOTE — Progress Notes (Signed)
  Speech Language Pathology Treatment: Dysphagia  Patient Details Name: Sally [name] MRN: [id number] DOB: 05-15-1951 Today's Date: 10/27/2017 Time: 6294-7654 SLP Time Calculation (min) (ACUTE ONLY): 15 min  Assessment / Plan / Recommendation Clinical Impression  Pt seen with thin liquids without difficulty and reports no difficulty with meals since diet upgrade. Pt requires only min cues to order softer food to aid her ease of bolus manipulation without dentures. Pt decannulated today. Her voice is breathy, but her vocal quality and intensity improves when pt applies pressure to stoma. Recommend continued regular solid and thin liquid diet with general aspiration precautions. Pt likely to move to CIR today and has met all SLP goals; therefore, acute SLP services are no longer indicated, defer further evaluation and treatment to inpatient rehab SLP team.      HPI HPI: 67 year old female with a past medical history significant for severe COPDcame to the emergency department on 09/26/2017 complaining of shortness of breath. Intubated on arrival. Two failed extubations: 1/8 and 1/11. Trach placed 1/15. Decanulated (2/1).      SLP Plan  Discharge SLP treatment due to (comment)(all goals met)       Recommendations  Diet recommendations: Regular;Thin liquid Liquids provided via: Cup;Straw Medication Administration: Whole meds with puree Supervision: Patient able to self feed;Intermittent supervision to cue for compensatory strategies Compensations: Slow rate;Small sips/bites;Minimize environmental distractions Postural Changes and/or Swallow Maneuvers: Seated upright 90 degrees                Oral Care Recommendations: Oral care BID Follow up Recommendations: Inpatient Rehab SLP Visit Diagnosis: Dysphagia, oropharyngeal phase (R13.12) Plan: Discharge SLP treatment due to (comment)(all goals met)       GO             Sally [name] SLP Student Clinician   Sally  Sally [name] 10/27/2017, 2:41 PM

## 2017-10-27 NOTE — Plan of Care (Signed)
Decannulate 2/1

## 2017-10-27 NOTE — PMR Pre-admission (Signed)
PMR Admission Coordinator Pre-Admission Assessment  Patient: Sally [name] is an 67 y.o., female MRN: [id number] DOB: Jun 04, 1951 Height: '5\' 1"'$  (154.9 cm) Weight: 69.3 kg (152 lb 12.5 oz)            Insurance Information HMO:  Yes    PPO:       PCP:       IPA:       80/20:       OTHER:   PRIMARY:  [employer] medicare      Policy#: [id number]      Subscriber: Sally [name] CM Name: Renato Gails      Phone#: [phone]      Fax#: [phone] Pre-Cert#: [id number]      Employer: Retired Benefits:  Phone #: [phone]     Name:  On line Eff. 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 Per chart review patient lives with spouse. Husband can assist as needed as well as daughter. Reported to be independent prior to admission. Presented 09/26/2017 with increasing shortness of breath and cough she failed BiPAP and required intubation.WBC 21,900.Placed on broad-spectrum antibiotics.hospital course prolonged intubation requiring tracheostomy tube 10/10/2017 downsize to a #4 cuffless and  decannulated 10/27/2017.Pneumothorax requiring chest tube that was removed 10/13/2017. Currently on a regular diet. Completed a 5 day course of Zosyn for right lower lobe  infiltrate. Subcutaneous heparin for DVT prophylaxis.Fitted with bilateral PRAFO boots. Acute on chronic anemia 10.9 and monitored. Physical occupational therapy evaluations completed 10/14/2017 with recommendations of physical medicine rehabilitation consult. Patient to be admitted for a comprehensive inpatient rehabilitation program.  Past Medical History  Past Medical History:  Diagnosis Date  . Asthma   . Back pain   . COPD (chronic obstructive pulmonary disease) ([place])   . Crohn disease ([place])   . Hypertension     Family History  family history includes Hypertension in her other.  Prior Rehab/Hospitalizations: No previous rehab.  Has the patient had major surgery during 100 days prior to admission? No  Current Medications   Current Facility-Administered Medications:  .  acetaminophen (TYLENOL) tablet 650 mg, 650 mg, Oral, Q6H PRN, Bonnielee Haff, MD, 650 mg at 10/23/17 1553 .  arformoterol (BROVANA) nebulizer solution 15 mcg, 15 mcg, Nebulization, BID, Kathi Ludwig, MD, 15 mcg at 10/27/17 0755 .  aspirin chewable tablet 81 mg, 81 mg, Oral, Daily, Bonnielee Haff, MD, 81 mg at 10/27/17 1028 .  budesonide (PULMICORT) nebulizer solution 0.5 mg, 0.5 mg, Nebulization, BID, Kathi Ludwig, MD, 0.5 mg at 10/27/17 0755 .  chlorhexidine gluconate (MEDLINE KIT) (PERIDEX) 0.12 % solution 15 mL, 15 mL, Mouth Rinse, BID, McQuaid, Ronie Spies, MD,  15 mL at 10/27/17 0804 .  Chlorhexidine Gluconate Cloth 2 % PADS 6 each, 6 each, Topical, Daily, Simonne Maffucci B, MD, 6 each at 10/27/17 1029 .  diltiazem (CARDIZEM) tablet 30 mg, 30 mg, Oral, Q8H, Bonnielee Haff, MD, 30 mg at 10/27/17 1328 .  famotidine (PEPCID) tablet 20 mg, 20 mg, Oral, BID, Bonnielee Haff, MD, 20 mg at 10/27/17 1028 .  fluconazole (DIFLUCAN) tablet 150 mg, 150 mg, Oral, Weekly, Dana Allan I, MD, 150 mg at 10/25/17 1234 .  gi cocktail (Maalox,Lidocaine,Donnatal), 30 mL, Per Tube, TID PRN, [name],  MD, 30 mL at 10/17/17 0839 .  guaiFENesin (ROBITUSSIN) 100 MG/5ML solution 100 mg, 5 mL, Per Tube, Q4H PRN, Bonnielee Haff, MD, 100 mg at 10/19/17 1407 .  heparin injection 5,000 Units, 5,000 Units, Subcutaneous, Q8H, McQuaid, Douglas B, MD, 5,000 Units at 10/27/17 1328 .  hydrALAZINE (APRESOLINE) injection 10 mg, 10 mg, Intravenous, Q6H PRN, Katherine Roan, MD, 10 mg at 10/06/17 2217 .  HYDROcodone-acetaminophen (NORCO) 7.5-325 MG per tablet 1 tablet, 1 tablet, Oral, Q6H PRN, Bonnielee Haff, MD, 1 tablet at 10/26/17 1451 .  LORazepam (ATIVAN) tablet 0.5 mg, 0.5 mg, Oral, Q4H PRN, Bonnielee Haff, MD, 0.5 mg at 10/27/17 0803 .  MEDLINE mouth rinse, 15 mL, Mouth Rinse, QID, McQuaid, Douglas B, MD, 15 mL at 10/27/17 1329 .  morphine 4 MG/ML injection 1 mg, 1 mg, Intravenous, Q4H PRN, Bonnielee Haff, MD, 1 mg at 10/21/17 1749 .  nicotine (NICODERM CQ - dosed in mg/24 hours) patch 21 mg, 21 mg, Transdermal, Daily, Kara Mead V, MD, 21 mg at 10/27/17 1029 .  ondansetron (ZOFRAN) injection 4 mg, 4 mg, Intravenous, Q6H PRN, Simonne Maffucci B, MD, 4 mg at 10/17/17 0454 .  [COMPLETED] predniSONE (DELTASONE) tablet 40 mg, 40 mg, Per Tube, Q breakfast, 40 mg at 10/15/17 0800 **FOLLOWED BY** [COMPLETED] predniSONE (DELTASONE) tablet 30 mg, 30 mg, Oral, Q breakfast, 30 mg at 10/20/17 0807 **FOLLOWED BY** [COMPLETED] predniSONE (DELTASONE) tablet 20 mg, 20 mg, Oral, Q breakfast, 20 mg at 10/25/17 0733 **FOLLOWED BY** predniSONE (DELTASONE) tablet 10 mg, 10 mg, Oral, Q breakfast, 10 mg at 10/27/17 0804 **FOLLOWED BY** [START ON 10/31/2017] predniSONE (DELTASONE) tablet 5 mg, 5 mg, Oral, Q breakfast, McClung, Kimberlee Nearing, MD .  RESOURCE THICKENUP CLEAR, , Oral, PRN, Bonnielee Haff, MD .  senna (SENOKOT) tablet 8.6 mg, 1 tablet, Oral, Daily PRN, Bonnielee Haff, MD .  sodium chloride flush (NS) 0.9 % injection 10-40 mL, 10-40 mL, Intracatheter, PRN, Juanito Doom, MD  Patients Current Diet: Diet regular  Room service appropriate? Yes; Fluid consistency: Thin  Precautions / Restrictions Precautions Precautions: Fall, Other (comment) Precaution Comments: trach capped Restrictions Weight Bearing Restrictions: No   Has the patient had 2 or more falls or a fall with injury in the past year?No  Prior Activity Level Community (5-7x/wk): Went out daily, ran errands, was driving.  Home Assistive Devices / Equipment Home Assistive Devices/Equipment: None(shower chair PRN )  Prior Device Use: Indicate devices/aids used by the patient prior to current illness, exacerbation or injury? None  Prior Functional Level Prior Function Comments: unsure  Self Care: Did the patient need help bathing, dressing, using the toilet or eating?  Independent  Indoor Mobility: Did the patient need assistance with walking from room to room (with or without device)? Independent  Stairs: Did the patient need assistance with internal or external stairs (with or without device)? Independent  Functional Cognition: Did the patient need help  planning regular tasks such as shopping or remembering to take medications? Independent  Current Functional Level Cognition  Overall Cognitive Status: Within Functional Limits for tasks assessed Difficult to assess due to: (trach capped) Orientation Level: Oriented X4 General Comments: pt talking to communicate, reports her husband can handle her at home    Extremity Assessment (includes Sensation/Coordination)  Upper Extremity Assessment: Generalized weakness  Lower Extremity Assessment: Generalized weakness(pt with drop foot bilaterally, no active ankle DF bilat)    ADLs  Overall ADL's : Needs assistance/impaired Eating/Feeding: Set up, Supervision/ safety, Bed level Grooming: Wash/dry hands, Wash/dry face, Bed level, Set up Toileting- Clothing Manipulation and Hygiene: Total assistance, Bed level General ADL Comments: Pt with high anxiety and fear of falling.      Mobility  Overal bed mobility: Needs Assistance Bed Mobility: Supine to Sit Rolling: +2 for physical assistance, Mod assist Sidelying to sit: +2 for physical assistance, Mod assist Supine to sit: +2 for physical assistance, Mod assist, Min assist Sit to supine: Min assist General bed mobility comments: Assisted with LEs to EOB.  Pt pulled up on PT with mod assist.     Transfers  Overall transfer level: Needs assistance Equipment used: Ambulation equipment used Transfer via Lift Equipment: Marketing executive Transfers: Sit to/from Stand Sit to Stand: +2 safety/equipment, Max assist, Mod assist General transfer comment: Used Clarise Cruz plus to stand pt with pt assisting movement.  Pt needed intermittent cues to activate hip extension and trunk extension as she seemed to do better than last treatment.  Pt stood 6 min.  Pt was able to weight shift left and right and lift foot up off of the platform.  Pt did buckle a few times but knee plate was in place.  Pt also states her feet feel "tingly" today as opposed to previously no feeling.  Encouraged nursing to use the Clarise Cruz plus for all transfers so that pt could stand more during the day and they agree.      Ambulation / Gait / Stairs / Office manager / Balance Dynamic Sitting Balance Sitting balance - Comments: in long sitting Balance Overall balance assessment: Needs assistance Sitting-balance support: Feet supported, No upper extremity supported Sitting balance-Leahy Scale: Fair Sitting balance - Comments: in long sitting Postural control: Posterior lean Standing balance support: During functional activity, Bilateral upper extremity supported Standing balance-Leahy Scale: Poor Standing balance comment: Had to have bil UE support and max assist to come to standing in [place] plus.  Cues to maintain standing for up to 6 minutes.  Ability to weight shift.     Special needs/care consideration BiPAP/CPAP No CPM No Continuous Drip IV   Dialysis No       Life Vest No Oxygen:  Has 02 3L [place] on at this time, but none at home Special Bed No Trach Size Decannulated 10/27/17 Wound Vac (area) No     Skin:  Has dressing to trach decannulation site on neck.  Has yeast infection per patient.  Bottom is "raw" per patient.                            Bowel mgmt: Patient reports last BM today, 10/27/17 Bladder mgmt: Voiding on bedpan Diabetic mgmt No    Previous Home Environment Living Arrangements: Spouse/significant other Home Care Services: No Additional Comments: Decannulated today 10/27/17  Discharge Living Setting Plans for Discharge Living Setting: Patient's home, House, Lives with (comment)(Lives  with husband, daughter and 47 yo grandchild.) Type of Home at Discharge: House Discharge Home Layout: One level Discharge Home Access: Stairs to enter Entrance Stairs-Number of Steps: 5 step entry Does the patient have any problems obtaining your medications?: No  Social/Family/Support Systems Patient Roles: Spouse, Parent(Has a husband, daughters and a 39 yr old grandchild.) Contact Information: Karesa [place] - spouse Anticipated Caregiver: husband Anticipated Caregiver's Contact Information: [place] - husband - [phone] Ability/Limitations of Caregiver: Husband is retired/disabled and can assist. Caregiver Availability: Evenings only Discharge Plan Discussed with Primary Caregiver: Yes Is Caregiver In Agreement with Plan?: Yes Does Caregiver/Family have Issues with Lodging/Transportation while Pt is in Rehab?: No  Goals/Additional Needs Patient/Family Goal for Rehab: PT mod I and supervision, OT supervision to min assist goals Expected length of stay: 13-20 days Cultural Considerations: Patient does not like foul language used in her presence Dietary Needs: Regular diet, thin liquids Equipment Needs: TBD Pt/Family Agrees to Admission and willing to participate: Yes Program Orientation Provided & Reviewed with  Pt/Caregiver Including Roles  & Responsibilities: Yes  Decrease burden of Care through IP rehab admission: N/A  Possible need for SNF placement upon discharge: Not anticipated  Patient Condition: This patient's condition remains as documented in the consult dated 10/26/17, in which the Rehabilitation Physician determined and documented that the patient's condition is appropriate for intensive rehabilitative care in an inpatient rehabilitation facility. Will admit to inpatient rehab today.  Preadmission Screen Completed By:  Retta Diones, 10/27/2017 4:47 PM ______________________________________________________________________   Discussed status with Dr.  Naaman Plummer on 10/27/17 at 1647 and received telephone approval for admission today.  Admission Coordinator:  Retta Diones, time 1647/Date 10/27/17

## 2017-10-28 ENCOUNTER — Inpatient Hospital Stay (HOSPITAL_COMMUNITY): Payer: Medicare Other | Admitting: Physical Therapy

## 2017-10-28 ENCOUNTER — Inpatient Hospital Stay (HOSPITAL_COMMUNITY): Payer: Medicare Other | Admitting: Occupational Therapy

## 2017-10-28 ENCOUNTER — Encounter (HOSPITAL_COMMUNITY): Payer: Self-pay

## 2017-10-28 ENCOUNTER — Other Ambulatory Visit: Payer: Self-pay

## 2017-10-28 LAB — GLUCOSE, CAPILLARY
GLUCOSE-CAPILLARY: 84 mg/dL (ref 65–99)
GLUCOSE-CAPILLARY: 104 mg/dL — AB (ref 65–99)
Glucose-Capillary: 124 mg/dL — ABNORMAL HIGH (ref 65–99)

## 2017-10-28 NOTE — Evaluation (Signed)
Occupational Therapy Assessment and Plan  Patient Details  Name: Sally [name] MRN: [id number] Date of Birth: 08-Sep-1951  OT Diagnosis: abnormal posture, muscle weakness (generalized) and impaired sensation Rehab Potential: Rehab Potential (ACUTE ONLY): Fair ELOS: 17-21 days   Today's Date: 10/28/2017 OT Individual Time: 1000-1058 and 1300-1345 OT Individual Time Calculation (min): 58 min   And 45 min  Problem List:  Patient Active Problem List   Diagnosis Date Noted  . Critical illness myopathy   . 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 Leukocytosis   . Tracheostomy status ([place])   . Hypoxemia   . Pneumothorax, traumatic   . Acute respiratory failure ([place])   . Influenza A   . Acute on chronic respiratory failure ([place])   . Acute respiratory failure with hypoxemia ([place]) 09/26/2017  . RSV (acute bronchiolitis due to respiratory syncytial virus) 10/06/2016  . COPD exacerbation ([place]) 07/14/2016  . Acute respiratory failure with hypoxia and hypercapnia (HCC)   . Essential hypertension   . Dyslipidemia     Past Medical History:  Past Medical History:  Diagnosis Date  . Asthma   . Back pain   . COPD (chronic obstructive pulmonary disease) ([place])   . Crohn disease ([place])   . Hypertension    Past Surgical History:  Past Surgical History:  Procedure Laterality Date  . ABDOMINAL HYSTERECTOMY    . CATARACT EXTRACTION, BILATERAL    . FRACTURE SURGERY Left    ARM    Assessment & Plan Clinical Impression: Patient is a 67 y.o. year old female withhistory of hypertension, severe COPD with tobacco abuse. Per chart review patient lives with spouse. Husband can assist as needed as well as daughter. Reported to be independent prior to admission. Presented 09/26/2017 with increasing shortness of breath and cough she failed BiPAP and required intubation.WBC 21,900.Placed on broad-spectrum antibiotics.hospital course prolonged intubation requiring tracheostomy tube 10/10/2017 downsize to a #4  cuffless anddecannulated02/09/2017.Pneumothorax requiring chest tube that was removed 10/13/2017. Currently on a regular diet.Completed a 5 day course of Zosyn for right lower lobe infiltrate.Subcutaneous heparin for DVT prophylaxis.Fitted with bilateral PRAFO boots.Acute on chronic anemia 10.9 and monitored. Physical occupational therapy evaluations completed 10/14/2017 with recommendations of physical medicine rehabilitation consult.Patient was admitted for a comprehensive rehabilitation program. Patient transferred to CIR on 10/27/2017 .    Patient currently requires mod - total A with basic self-care skills secondary to muscle weakness, decreased cardiorespiratoy endurance and decreased oxygen support, decreased coordination and decreased sitting balance, decreased standing balance, decreased balance strategies and impaired sensatoin.  Prior to hospitalization, patient could complete ADLs with independent .  Patient will benefit from skilled intervention to decrease level of assist with basic self-care skills prior to discharge home with care partner.  Anticipate patient will require minimal physical assistance and follow up home health.  OT - End of Session Activity Tolerance: Decreased this session Endurance Deficit: Yes Endurance Deficit Description: increased work of breathing w/ all functional movement, requires frequent rest breaks because of this, on 3-4L O2 throughout session, very anxious  OT Assessment Rehab Potential (ACUTE ONLY): Fair OT Barriers to Discharge: Medical stability;Home environment access/layout OT Barriers to Discharge Comments: pt reported her daughter is at home cleaning because she thinks pt is a Chiropractor" OT Patient demonstrates impairments in the following area(s): Balance;Safety;Endurance;Motor;Pain OT Basic ADL's Functional Problem(s): Grooming;Bathing;Dressing;Toileting OT Transfers Functional Problem(s): Toilet;Tub/Shower OT Additional Impairment(s):  None OT Plan OT Intensity: Minimum of 1-2 x/day, 45 to 90 minutes OT Frequency: 5 out of 7 days  OT Duration/Estimated Length of Stay: 17-21 days OT Treatment/Interventions: Balance/vestibular training;Discharge planning;Pain management;Self Care/advanced ADL retraining;Therapeutic Activities;UE/LE Coordination activities;Community reintegration;DME/adaptive equipment instruction;Psychosocial support;UE/LE Strength taining/ROM;Therapeutic Exercise;Patient/family education;Functional mobility training OT Self Feeding Anticipated Outcome(s): n/a OT Basic Self-Care Anticipated Outcome(s): S - min A OT Toileting Anticipated Outcome(s): min A OT Bathroom Transfers Anticipated Outcome(s): min A OT Recommendation Recommendations for Other Services: Neuropsych consult;Therapeutic Recreation consult Therapeutic Recreation Interventions: Pet therapy Patient destination: Home Follow Up Recommendations: Home health OT Equipment Recommended: To be determined  Skilled Therapeutic Intervention Session 1: Upon entering the room, pt seated in wheelchair with reports of extreme fatigue. Pt requesting to transfer back into bed. Pt very anxious with functional transfers and fearful of falling. Pt transfers with increased time and step by step instructions secondary to increased anxiety. Pt needing mod A for slide board transfer to R side onto bed from wheelchair. Pt required min A for sit >supine. OT educating pt on OT purpose, POC, and goals with pt verbalizing understanding and agreement.OT discussed schedule and made plan with pt for next session. Education regarding energy conservation started but to continue. Call bell and all needed items within reach. Bed alarm activated upon exiting the room.   Session 2: Upon entering the room, pt supine in bed with daughter present in the room. Pt verbalized various leisure interests of crafting and the arts. Pt performed supine >sit with min A to EOB and seated with close  supervision for sitting balance for 30 minutes while working on adult coloring page. Pt declined transfer into wheelchair secondary to fatigue. Pt returning to supine at end of session with call bell and all needed items within reach upon exiting the room.   OT Evaluation Precautions/Restrictions  Precautions Precautions: Fall Restrictions Weight Bearing Restrictions: No Vital Signs Therapy Vitals Temp: 97.7 F (36.5 C) Temp Source: Oral Pulse Rate: 94 Resp: 18 BP: (!) 160/70 Patient Position (if appropriate): Sitting Oxygen Therapy SpO2: 97 % O2 Device: Nasal Cannula O2 Flow Rate (L/min): 3 L/min Pain Pain Assessment Pain Assessment: No/denies pain Pain Score: 8  Pain Type: Acute pain Pain Location: Back Pain Descriptors / Indicators: Aching Pain Onset: Gradual Pain Intervention(s): Medication (See eMAR) Home Living/Prior Functioning Home Living Family/patient expects to be discharged to:: Private residence Living Arrangements: Spouse/significant other Available Help at Discharge: Family, Available 24 hours/day Type of Home: House Home Access: Stairs to enter Technical brewer of Steps: 5 Entrance Stairs-Rails: None Home Layout: One level Bathroom Shower/Tub: Public librarian, Multimedia programmer: Programmer, systems: Yes  Lives With: Spouse, Family Prior Function Level of Independence: Independent with basic ADLs, Independent with transfers, Independent with homemaking with ambulation, Independent with gait  Able to Take Stairs?: Yes Driving: Yes 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 Leisure: Hobbies-yes (Comment) Comments: enjoys being w/ grandchildren, arts and crafts Vision Baseline Vision/History: Wears glasses Wears Glasses: Reading only Patient Visual Report: No change from baseline Vision Assessment?: No apparent visual deficits Perception  Perception: Within Functional Limits Praxis Praxis:  Intact Cognition Overall Cognitive Status: Within Functional Limits for tasks assessed Arousal/Alertness: Awake/alert Orientation Level: Person;Place;Situation Person: Oriented Place: Oriented Situation: Oriented Year: 2019 Month: February Day of Week: Correct Memory: Appears intact Immediate Memory Recall: Sock;Blue;Bed Memory Recall: Sock;Blue;Bed Memory Recall Sock: Without Cue Memory Recall Blue: Without Cue Memory Recall Bed: Without Cue Attention: Focused;Sustained Focused Attention: Appears intact Sustained Attention: Appears intact Awareness: Appears intact Problem Solving: Appears intact Safety/Judgment: Appears intact Sensation Sensation Light Touch: Impaired by gross assessment Stereognosis: Not tested  Hot/Cold: Not tested Proprioception: Impaired by gross assessment Coordination Gross Motor Movements are Fluid and Coordinated: Yes Fine Motor Movements are Fluid and Coordinated: Yes Motor  Motor Motor: Other (comment) Motor - Skilled Clinical Observations: generalized weakness, foot drop bilterally Mobility  Bed Mobility Bed Mobility: Rolling Right;Rolling Left;Supine to Sit Rolling Right: 4: Min assist Rolling Left: 4: Min assist Supine to Sit: 4: Min assist  Trunk/Postural Assessment  Cervical Assessment Cervical Assessment: Exceptions to WFL(forward head) Thoracic Assessment Thoracic Assessment: Within Functional Limits Lumbar Assessment Lumbar Assessment: Exceptions to WFL(posterior pelvic tilt) Postural Control Postural Control: Within Functional Limits  Balance Balance Balance Assessed: Yes Dynamic Sitting Balance Dynamic Sitting - Level of Assistance: 5: Stand by assistance Extremity/Trunk Assessment RUE Assessment RUE Assessment: Exceptions to WFL(3+/5 gross) LUE Assessment LUE Assessment: Exceptions to WFL(3+/5 gross)   See Function Navigator for Current Functional Status.   Refer to Care Plan for Long Term Goals  Recommendations  for other services: Neuropsych and Therapeutic Recreation  Pet therapy   Discharge Criteria: Patient will be discharged from OT if patient refuses treatment 3 consecutive times without medical reason, if treatment goals not met, if there is a change in medical status, if patient makes no progress towards goals or if patient is discharged from hospital.  The above assessment, treatment plan, treatment alternatives and goals were discussed and mutually agreed upon: by patient  Gypsy Decant 10/28/2017, 2:57 PM

## 2017-10-28 NOTE — Evaluation (Signed)
Speech Language Pathology Assessment and Plan  Patient Details  Name: Sally [name] MRN: [id number] Date of Birth: 1951/01/28  SLP Diagnosis: Voice disorder  Rehab Potential: Excellent ELOS: 1 week for ST    Today's Date: 10/28/2017 SLP Individual Time: 4098-1191 SLP Individual Time Calculation (min): 60 min   Problem List:  Patient Active Problem List   Diagnosis Date Noted  . Critical illness myopathy   . 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 Leukocytosis   . Tracheostomy status ([place])   . Hypoxemia   . Pneumothorax, traumatic   . Acute respiratory failure ([place])   . Influenza A   . Acute on chronic respiratory failure ([place])   . Acute respiratory failure with hypoxemia ([place]) 09/26/2017  . RSV (acute bronchiolitis due to respiratory syncytial virus) 10/06/2016  . COPD exacerbation ([place]) 07/14/2016  . Acute respiratory failure with hypoxia and hypercapnia ([facility])   . Essential hypertension   . Dyslipidemia    Past Medical History:  Past Medical History:  Diagnosis Date  . Asthma   . Back pain   . COPD (chronic obstructive pulmonary disease) ([place])   . Crohn disease ([place])   . Hypertension    Past Surgical History:  Past Surgical History:  Procedure Laterality Date  . ABDOMINAL HYSTERECTOMY    . CATARACT EXTRACTION, BILATERAL    . FRACTURE SURGERY Left    ARM    Assessment / Plan / Recommendation Clinical Impression Sally [name] a 67 y.o.right handedfemalewith history of hypertension, severe COPD with tobacco abuse. Per chart review patient lives with spouse. Husband can assist as needed as well as daughter. Reported to be independent prior to admission. Presented 09/26/2017 with increasing shortness of breath and cough she failed BiPAP and required intubation.WBC 21,900.Placed on broad-spectrum antibiotics. Hospital course prolonged intubation requiring tracheostomy tube 10/10/2017 downsize to a #4 cuffless anddecannulated02/09/2017.Pneumothorax requiring chest  tube that was removed 10/13/2017. Currently on a regular diet.Completed a 5 day course of Zosyn for right lower lobe infiltrate.Subcutaneous heparin for DVT prophylaxis.Fitted with bilateral PRAFO boots.Acute on chronic anemia 10.9 and monitored. Pt on regular diet with thin liquids.Physical occupational therapy evaluations completed 10/14/2017 with recommendations of physical medicine rehabilitation consult.  Patient was admitted for a comprehensive rehabilitation program on 10/27/17. Bedside Swallow Evaluation and cognitive linguistic evaluations completed on 10/28/17. Pt consumed regular lunch tray with thin liquids without overt s/s of aspiration or discomfort. Pt also presents with functional cognitive abilities as evidenced by score of 27 out of 30 on MOCA version 7.2 (n=>26). Pt's most immediate deficts are with speech itnelligibility. Pt's speech intelligibility is decreased d/t vocal hoarseness/breathiness and low vocal intesity. Recommend short course of skilled ST to target voice disorder and the possibility of RMST once medically cleared with trach site. Don't anticipate that pt will require follow up ST services.    Skilled Therapeutic Interventions          Skilled treatment session focused on completion of BSE and SLE, see above. Education provided on placing 2 fingers to stoma bandages to decrease airflow through stoma. Pt able to return demonstrate. Given cues, pt able to demonstrate some ability to increase vocal intensity but didn't force significant air d/t healing stoma.    SLP Assessment  Patient will need skilled Speech Lanaguage Pathology Services during CIR admission    Recommendations  SLP Diet Recommendations: Age appropriate regular solids;Thin Liquid Administration via: Cup;Straw Medication Administration: Whole meds with puree Compensations: Slow rate;Small sips/bites;Minimize environmental distractions Postural Changes and/or Swallow Maneuvers: Seated  upright 90  degrees Oral Care Recommendations: Oral care BID Patient destination: Home Follow up Recommendations: None Equipment Recommended: None recommended by SLP    SLP Frequency 3 to 5 out of 7 days   SLP Duration  SLP Intensity  SLP Treatment/Interventions 1 week for ST  Minumum of 1-2 x/day, 30 to 90 minutes  Speech/Language facilitation;Patient/family education    Pain Pain Assessment Pain Assessment: No/denies pain Pain Score: Asleep Pain Type: Acute pain Pain Location: Back Pain Descriptors / Indicators: Aching Pain Onset: Gradual Pain Intervention(s): Medication (See eMAR)  Prior Functioning Cognitive/Linguistic Baseline: Within functional limits Type of Home: House  Lives With: Spouse;Family Available Help at Discharge: Family;Available 24 hours/day Vocation: Retired  Function:  Eating Eating   Modified Consistency Diet: No             Cognition Comprehension Comprehension assist level: Follows complex conversation/direction with extra time/assistive device;Follows complex conversation/direction with no assist  Expression   Expression assist level: Expresses complex ideas: With extra time/assistive device;Expresses complex ideas: With no assist  Social Interaction Social Interaction assist level: Interacts appropriately with others - No medications needed.  Problem Solving Problem solving assist level: Solves complex problems: With extra time;Solves complex problems: Recognizes & self-corrects  Memory Memory assist level: Complete Independence: No helper   Short Term Goals: Week 1: SLP Short Term Goal 1 (Week 1): Pt will utilize speech intelligibility strategies to achieve ~ 90 intelligibility at the sentence level in a mildly noisey environment.  SLP Short Term Goal 2 (Week 1): Pt will answer [facility] questions regarding vocal hygiene with supervision cues.  SLP Short Term Goal 3 (Week 1): Pt will complete RMST assessment when medically cleared.   Refer to Care  Plan for Long Term Goals  Recommendations for other services: None   Discharge Criteria: Patient will be discharged from SLP if patient refuses treatment 3 consecutive times without medical reason, if treatment goals not met, if there is a change in medical status, if patient makes no progress towards goals or if patient is discharged from hospital.  The above assessment, treatment plan, treatment alternatives and goals were discussed and mutually agreed upon: by patient and by family  Sally [name] 10/28/2017, 4:27 PM

## 2017-10-28 NOTE — Evaluation (Signed)
Physical Therapy Assessment and Plan  Patient Details  Name: Sally [name] MRN: [id number] Date of Birth: 27-Aug-1951  PT Diagnosis: Difficulty walking, Impaired sensation and Muscle weakness Rehab Potential: Good ELOS: 17-20 days   Today's Date: 10/28/2017 PT Individual Time: 0800-0900 PT Individual Time Calculation (min): 60 min   Missed time: 30 min (fatigue)   Problem List:  Patient Active Problem List   Diagnosis Date Noted  . Critical illness myopathy   . 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 Leukocytosis   . Tracheostomy status ([place])   . Hypoxemia   . Pneumothorax, traumatic   . Acute respiratory failure ([place])   . Influenza A   . Acute on chronic respiratory failure ([place])   . Acute respiratory failure with hypoxemia ([place]) 09/26/2017  . RSV (acute bronchiolitis due to respiratory syncytial virus) 10/06/2016  . COPD exacerbation ([place]) 07/14/2016  . Acute respiratory failure with hypoxia and hypercapnia ([facility])   . Essential hypertension   . Dyslipidemia     Past Medical History:  Past Medical History:  Diagnosis Date  . Asthma   . Back pain   . COPD (chronic obstructive pulmonary disease) ([place])   . Crohn disease ([place])   . Hypertension    Past Surgical History:  Past Surgical History:  Procedure Laterality Date  . ABDOMINAL HYSTERECTOMY    . CATARACT EXTRACTION, BILATERAL    . FRACTURE SURGERY Left    ARM    Assessment & Plan Clinical Impression: Patient is a 67 y.o.right handedfemalewith history of hypertension, severe COPD with tobacco abuse. Per chart review patient lives with spouse. Husband can assist as needed as well as daughter. Reported to be independent prior to admission. Presented 09/26/2017 with increasing shortness of breath and cough she failed BiPAP and required intubation. WBC 21,900. Placed on broad-spectrum antibiotics. hospital course prolonged intubation requiring tracheostomy tube 10/10/2017 downsize to a #4 cuffless  anddecannulated02/09/2017. Pneumothorax requiring chest tube that was removed 10/13/2017. Currently on a regular diet.Completed a 5 day course of Zosyn for right lower lobe infiltrate.Subcutaneous heparin for DVT prophylaxis.Fitted with bilateral PRAFO boots.Acute on chronic anemia 10.9 and monitored. Physical occupational therapy evaluations completed 10/14/2017 with recommendations of physical medicine rehabilitation consult. Patient transferred to CIR on 10/27/2017 .   Patient currently requires mod with mobility secondary to muscle weakness and muscle paralysis, decreased cardiorespiratoy endurance and decreased oxygen support and decreased standing balance, decreased balance strategies and difficulty maintaining precautions.  Prior to hospitalization, patient was independent  with mobility and lived with Spouse, Family in a House home.  Home access is 5Stairs to enter.  Patient will benefit from skilled PT intervention to maximize safe functional mobility, minimize fall risk and decrease caregiver burden for planned discharge home with 24 hour assist.  Anticipate patient will benefit from follow up [facility] at discharge.  PT - End of Session Activity Tolerance: Tolerates < 10 min activity, no significant change in vital signs Endurance Deficit: Yes Endurance Deficit Description: increased work of breathing w/ all functional movement, requires frequent rest breaks because of this, on 3-4L O2 throughout session PT Assessment Rehab Potential (ACUTE/IP ONLY): Good PT Barriers to Discharge: Inaccessible home environment;Home environment access/layout PT Plan PT Intensity: Minimum of 1-2 x/day ,45 to 90 minutes PT Frequency: 5 out of 7 days PT Duration Estimated Length of Stay: 17-20 days PT Treatment/Interventions: Ambulation/gait training;Disease management/prevention;Pain management;Stair training;Visual/perceptual remediation/compensation;Therapeutic Activities;Wheelchair  propulsion/positioning;Patient/family education;DME/adaptive equipment instruction;Balance/vestibular training;Cognitive remediation/compensation;Functional electrical stimulation;Psychosocial support;Therapeutic Exercise;UE/LE Strength taining/ROM;Skin care/wound management;Functional mobility training;Community reintegration;Discharge planning;Neuromuscular re-education;Splinting/orthotics;UE/LE  Coordination activities PT Transfers Anticipated Outcome(s): supervision PT Locomotion Anticipated Outcome(s): min assist short distance gait PT Recommendation Follow Up Recommendations: Home health PT Patient destination: Home Equipment Recommended: To be determined  Skilled Therapeutic Intervention  Session 1:  Pt in supine on bedpan, agreeable to therapy, no c/o pain. Assisted pt w/ getting off bedpan, min assist to roll bilaterally using bed rails and total assist for pericare. Applied barrier ointment as per pt's request to gluteal folds. Transferred to EOB w/ min assist and maintain static sitting w/ close supervision for 5+ minutes as pt felt dizzy and increased work of breathing, both resolved w/ rest. Transferred to w/c via slide board transfer w/ mod assist, manual assist given at chuck pad. Oriented pt to unit in w/c, total assist. Pt self-propelled w/c back to room in 50-75' bouts using BUEs, min assist for steering at corners otherwise supervision. Mild increase in fatigue, however pt agreeable to spend time sitting up in w/c until OT session. Pt educated patient in [place], rehab potential, rehab goals, and discharge recommendations. Ended session sitting up in w/c, call bell within reach and all needs met. Required increased time for all mobility this session 2/2 anxiety and fear of movement.   Session 2:  Attempted to see pt 2x in afternoon. Both times pt required max stimulation to arouse, sleeping very heavily. Unable to maintain eyes open to converse w/ PT. Missed 30 min of skilled PT 2/2  fatigue.   PT Evaluation Precautions/Restrictions Precautions Precautions: Fall Restrictions Weight Bearing Restrictions: No General   Therapy Vitals Pulse Rate: (!) 101 Resp: 20 BP: (!) 156/58 Patient Position (if appropriate): Sitting Oxygen Therapy SpO2: 98 % O2 Device: Nasal Cannula O2 Flow Rate (L/min): 3 L/min FiO2 (%): 32 % Pain Pain Assessment Pain Assessment: No/denies pain Pain Score: 6  Pain Type: Acute pain Pain Location: Back Pain Orientation: Lower Pain Descriptors / Indicators: Aching Pain Onset: On-going Pain Intervention(s): Medication (See eMAR) Home Living/Prior Functioning Home Living Available Help at Discharge: Family;Available 24 hours/day(Husband able to provide 24/7 supervision, daughters planning to assist intermittently) Type of Home: House Home Access: Stairs to enter [employer] of Steps: 5 Entrance Stairs-Rails: None Home Layout: One level  Lives With: Spouse;Family Prior Function Level of Independence: Independent with basic ADLs;Independent with transfers;Independent with homemaking with ambulation;Independent with gait  Able to Take Stairs?: Yes Driving: Yes 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 Leisure: Hobbies-yes (Comment) Comments: enjoys being w/ grandchildren Vision/Perception  Perception Perception: Within Functional Limits Praxis Praxis: Intact  Cognition Overall Cognitive Status: Within Functional Limits for tasks assessed Arousal/Alertness: Awake/alert Orientation Level: Oriented X4 Attention: Focused;Sustained Focused Attention: Appears intact Sustained Attention: Appears intact Memory: Appears intact Awareness: Appears intact Problem Solving: Appears intact Safety/Judgment: Appears intact Sensation Sensation Light Touch: Impaired by gross assessment(Reports impaired sensation mid calf and below into feets, reports tingling bilateral feet) Proprioception: Impaired by gross  assessment(Impaired in feet) Coordination Gross Motor Movements are Fluid and Coordinated: Yes Fine Motor Movements are Fluid and Coordinated: Yes Motor  Motor Motor: Other (comment) Motor - Skilled Clinical Observations: generalized weakness, foot drop bilterally  Mobility Bed Mobility Bed Mobility: Rolling Right;Rolling Left;Supine to Sit Rolling Right: 4: Min guard Rolling Left: 4: Min guard Supine to Sit: 4: Min assist Supine to Sit Details: Manual facilitation for weight shifting Supine to Sit Details (indicate cue type and reason): assisted at LEs, HHA to pull up into sitting from [facility] elevated Transfers Transfers: Yes Lateral/Scoot Transfers: 3: Mod assist;With slide board Lateral/Scoot  Transfer Details: Verbal cues for technique;Verbal cues for precautions/safety;Manual facilitation for weight shifting Lateral/Scoot Transfer Details (indicate cue type and reason): Manual facilitation at chuck pad 2/2 skin irritation on bottom, blocked knees from sliding forward, very slow 2/2 fatigue and anxiety Locomotion  Ambulation Ambulation: No Gait Gait: No Stairs / Additional Locomotion Stairs: No Wheelchair Mobility Wheelchair Mobility: Yes Wheelchair Assistance: 4: Advertising account executive Details: Verbal cues for Marketing executive: Both upper extremities Wheelchair Parts Management: Needs assistance Distance: 46'  Trunk/Postural Assessment  Cervical Assessment Cervical Assessment: Exceptions to WFL(forward head rounded shoulder posture) Thoracic Assessment Thoracic Assessment: Within Functional Limits Lumbar Assessment Lumbar Assessment: Exceptions to WFL(posterior pelvic tilt) Postural Control Postural Control: Within Functional Limits  Balance Balance Balance Assessed: Yes Static Sitting Balance Static Sitting - Level of Assistance: 5: Stand by assistance(UE support on bed) Dynamic Sitting Balance Dynamic Sitting - Level of Assistance: 5:  Stand by assistance(UE support on bed, while leaning to place chuck pad for transfer) Extremity Assessment  RLE Assessment RLE Assessment: Exceptions to WFL(hip flexion 3/5, knee ms 3-/5, ankle ms 0/5 and unable to DF past 0 deg) LLE Assessment LLE Assessment: Exceptions to WFL(ip flexion 3/5, knee ms 2+/5, ankle ms 0/5 and unable to DF past 0 deg)   See Function Navigator for Current Functional Status.   Refer to Care Plan for Long Term Goals  Recommendations for other services: None   Discharge Criteria: Patient will be discharged from PT if patient refuses treatment 3 consecutive times without medical reason, if treatment goals not met, if there is a change in medical status, if patient makes no progress towards goals or if patient is discharged from hospital.  The above assessment, treatment plan, treatment alternatives and goals were discussed and mutually agreed upon: by patient  Saisha Hogue K Arnette 10/28/2017, 10:09 AM

## 2017-10-28 NOTE — Progress Notes (Signed)
  Subjective/Complaints:  No breathing problems noted.  We discussed her weakness she denied any foot drop prior to her hospitalization.  Review of systems denies chest pain, shortness of breath, nausea, diarrhea, constipation Objective: Vital Signs: Blood pressure (!) 156/58, pulse (!) 101, temperature 98.4 F (36.9 C), temperature source Oral, resp. rate 20, height 5\' 2"  (1.575 m), weight 70.6 kg (155 lb 10.3 oz), SpO2 98 %. No results found. Results for orders placed or performed during the hospital encounter of 10/27/17 (from the past 72 hour(s))  Glucose, capillary     Status: None   Collection Time: 10/28/17  6:58 AM  Result Value Ref Range   Glucose-Capillary 84 65 - 99 mg/dL  Glucose, capillary     Status: Abnormal   Collection Time: 10/28/17 11:51 AM  Result Value Ref Range   Glucose-Capillary 124 (H) 65 - 99 mg/dL     HEENT: Trach site with foam dressing Cardio: RRR and No murmurs Resp: CTA B/L and Unlabored GI: BS positive and Nontender nondistended Extremity:  No Edema Skin:   Intact Neuro: Alert/Oriented, Abnormal Sensory Reduced sensation in the toes and Abnormal Motor 2- bilateral hip flexor 3- knee extensors 0 at the ankle dorsiflexors 1 at the ankle plantar flexors Musc/Skel:  Other No pain with upper extremity lower extremity range of motion General no acute distress   Assessment/Plan: 1. 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 Physiatrist is providing close team supervision and 24 hour management of active medical problems listed below. Physiatrist and rehab team continue to assess barriers to discharge/monitor patient progress toward functional and medical goals. FIM:                                  Medical Problem List and Plan: 1.Decreased functional mobility with bilateral foot dropsecondary to  critical illness neuropathy/myopathyafter ventilator dependent respiratory failure.Tracheostomy 10/10/2017-decannulated 10/27/2017 -CIR eval's today -PRAFO's for bilateral ankles  2. DVT Prophylaxis/Anticoagulation: Subcutaneous heparin.Check vascular study 3. Pain Management:Hydrocodone as needed. -schedule tylenol bid -kpad 4. Mood:Ativan 0.5 mg every 4 hours as needed.Provide emotional support 5. Neuropsych: This patientiscapable of making decisions on herown behalf. 6. Skin/Wound Care:Routine skin checks 7. Fluids/Electrolytes/Nutrition:Routine I&O's with follow-up chemistries, intake low so far 120 mL's today 8.Severe COPD with tobacco abuse. Continue nebulizers. Provide counseling regarding cessation of nicotine products.NicoDerm patch as well as prednisone taper 9.Right-sided pneumothorax. Chest tube removed 10/13/2017, no shortness of breath 10. S/p Trach: decannulated. Continue compressive dressing daily    LOS (Days) 1 A FACE TO FACE EVALUATION WAS PERFORMED  Erick Colace 10/28/2017, 12:58 PM

## 2017-10-29 ENCOUNTER — Inpatient Hospital Stay (HOSPITAL_COMMUNITY): Payer: Medicare Other | Admitting: *Deleted

## 2017-10-29 ENCOUNTER — Inpatient Hospital Stay (HOSPITAL_COMMUNITY): Payer: Medicare Other

## 2017-10-29 DIAGNOSIS — M7989 Other specified soft tissue disorders: Secondary | ICD-10-CM

## 2017-10-29 LAB — GLUCOSE, CAPILLARY
Glucose-Capillary: 107 mg/dL — ABNORMAL HIGH (ref 65–99)
Glucose-Capillary: 111 mg/dL — ABNORMAL HIGH (ref 65–99)

## 2017-10-29 NOTE — Plan of Care (Signed)
  Not Progressing RH PAIN MANAGEMENT RH STG PAIN MANAGED AT OR BELOW PT'S PAIN GOAL Description Less than 3 out of 10  Consistently rating pain > 3. 10/29/2017 0453 - Not Progressing by Martina Sinner, RN

## 2017-10-29 NOTE — Progress Notes (Signed)
Doppler results called to MD Kirsteins. Pt to have repeat dopplers in 5 days.

## 2017-10-29 NOTE — Progress Notes (Addendum)
Subjective/Complaints:  No problems overnight  Review of systems denies chest pain, shortness of breath, nausea, diarrhea, constipation Objective: Vital Signs: Blood pressure (!) 165/67, pulse (!) 103, temperature 98.2 F (36.8 C), temperature source Oral, resp. rate 18, height 5\' 2"  (1.575 m), weight 69.3 kg (152 lb 12.5 oz), SpO2 96 %. No results found. Results for orders placed or performed during the hospital encounter of 10/27/17 (from the past 72 hour(s))  Glucose, capillary     Status: None   Collection Time: 10/28/17  6:58 AM  Result Value Ref Range   Glucose-Capillary 84 65 - 99 mg/dL  Glucose, capillary     Status: Abnormal   Collection Time: 10/28/17 11:51 AM  Result Value Ref Range   Glucose-Capillary 124 (H) 65 - 99 mg/dL  Glucose, capillary     Status: Abnormal   Collection Time: 10/28/17  4:44 PM  Result Value Ref Range   Glucose-Capillary 104 (H) 65 - 99 mg/dL  Glucose, capillary     Status: Abnormal   Collection Time: 10/29/17 11:46 AM  Result Value Ref Range   Glucose-Capillary 107 (H) 65 - 99 mg/dL     HEENT: Trach site with foam dressing Cardio: RRR and No murmurs Resp: CTA B/L and Unlabored GI: BS positive and Nontender nondistended Extremity:  No Edema Skin:   Intact Neuro: Alert/Oriented, Abnormal Sensory Reduced sensation in the toes and Abnormal Motor 2- bilateral hip flexor 3- knee extensors 0 at the ankle dorsiflexors 1 at the ankle plantar flexors Musc/Skel:  Other No pain with upper extremity lower extremity range of motion General no acute distress   Assessment/Plan: 1. 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 Physiatrist is providing close team supervision and 24 hour management of active medical problems listed below. Physiatrist and rehab team continue to assess barriers to discharge/monitor patient  progress toward functional and medical goals. FIM: Function - Bathing Bathing activity did not occur: Refused  Function- Upper Body Dressing/Undressing Upper body dressing/undressing activity did not occur: Refused Function - Lower Body Dressing/Undressing What is the patient wearing?: Non-skid slipper socks Non-skid slipper socks- Performed by helper: Don/doff right sock, Don/doff left sock Assist for footwear: Dependant  Function - Toileting Toileting activity did not occur: No continent bowel/bladder event     Function - Chair/bed transfer Chair/bed transfer method: Lateral scoot Chair/bed transfer assist level: Maximal assist (Pt 25 - 49%/lift and lower) Chair/bed transfer assistive device: Sliding board Chair/bed transfer details: Verbal cues for safe use of DME/AE, Verbal cues for technique, Manual facilitation for placement, Manual facilitation for weight shifting, Verbal cues for precautions/safety  Function - Locomotion: Wheelchair Will patient use wheelchair at discharge?: (TBD) Type: Manual Max wheelchair distance: 100 Assist Level: Touching or steadying assistance (Pt > 75%) Assist Level: Touching or steadying assistance (Pt > 75%) Wheel 150 feet activity did not occur: Safety/medical concerns Turns around,maneuvers to table,bed, and toilet,negotiates 3% grade,maneuvers on rugs and over doorsills: No Function - Locomotion: Ambulation Ambulation activity did not occur: Safety/medical concerns Walk 10 feet activity did not occur: Safety/medical concerns Walk 50 feet with 2 turns activity did not occur: Safety/medical concerns Walk 150 feet activity did not occur: Safety/medical concerns Walk 10 feet on uneven surfaces activity did not occur: Safety/medical concerns  Function - Comprehension Comprehension: Auditory Comprehension assist level: Follows complex conversation/direction with no assist  Function - Expression Expression: Verbal Expression assist level:  Expresses complex  ideas: With no assist  Function - Social Interaction Social Interaction assist level: Interacts appropriately with others - No medications needed.  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 DVT Prophylaxis/Anticoagulation: Subcutaneous heparin.Check vascular study- small isolated asymptomatic distal  DVT, repeat doppler in 5-7 D  3. Pain Management:Hydrocodone as needed. -schedule tylenol bid -kpad 4. Mood:Ativan 0.5 mg every 4 hours as needed.Provide emotional support 5. Neuropsych: This patientiscapable of making decisions on herown behalf. 6. Skin/Wound Care:Routine skin checks 7. Fluids/Electrolytes/Nutrition:Routine I&O's with follow-up chemistries, intake low so far 760 mL's on 10/28/2017 8.Severe COPD with tobacco abuse. Continue nebulizers. Provide counseling regarding cessation of nicotine products.NicoDerm patch as well as prednisone taper 9.Right-sided pneumothorax. Chest tube removed 10/13/2017, no shortness of breath 10. S/p Trach: decannulated. Continue compressive dressing daily, some air leakage, patient up and states by putting pressure on foam dressing over trach site    LOS (Days) 2 A FACE TO FACE EVALUATION WAS PERFORMED  Erick Colace 10/29/2017, 12:12 PM

## 2017-10-29 NOTE — IPOC Note (Addendum)
Overall Plan of Care [facility]) Patient Details Name: Sally [name] MRN: [id number] DOB: May 11, 1951  Admitting Diagnosis: <principal problem not specified>  Hospital Problems: Active Problems:   Critical illness myopathy   Critical illness neuropathy (HCC)   Leukocytosis     Functional Problem List: Nursing Endurance, Safety, Pain  PT Balance, Endurance, Motor, Safety, Sensory  OT Balance, Safety, Endurance, Motor, Pain  SLP    TR         Basic ADL's: OT Grooming, Bathing, Dressing, Toileting     Advanced  ADL's: OT       Transfers: PT Bed to Chair, Bed Mobility, Car, Furniture, Civil Service fast streamer, Research scientist (life sciences): PT Ambulation, Psychologist, prison and probation services, Stairs     Additional Impairments: OT None  SLP Communication expression    TR      Anticipated Outcomes Item Anticipated Outcome  Self Feeding n/a  Swallowing      Basic self-care  S - min A  Toileting  min A   Bathroom Transfers min A  Bowel/Bladder  pt will maintain regular bowel schedule  Transfers  supervision  Locomotion  min assist short distance gait  Communication  Mod I at conversation level  Cognition     Pain  pt pain will be at 2/10 or lower  Safety/Judgment  pt will decrease knowledge deficit in areas of safety    Therapy Plan: PT Intensity: Minimum of 1-2 x/day ,45 to 90 minutes PT Frequency: 5 out of 7 days PT Duration Estimated Length of Stay: 17-20 days OT Intensity: Minimum of 1-2 x/day, 45 to 90 minutes OT Frequency: 5 out of 7 days OT Duration/Estimated Length of Stay: 17-21 days SLP Intensity: Minumum of 1-2 x/day, 30 to 90 minutes SLP Frequency: 3 to 5 out of 7 days SLP Duration/Estimated Length of Stay: 1 week for ST    Team Interventions: Nursing Interventions Skin Care/Wound Management, Bowel Management, Pain Management, Bladder Management  PT interventions Ambulation/gait training, Disease management/prevention, Pain management, Stair training,  Visual/perceptual remediation/compensation, Therapeutic Activities, Wheelchair propulsion/positioning, Patient/family education, DME/adaptive equipment instruction, Warden/ranger, Cognitive remediation/compensation, Functional electrical stimulation, Psychosocial support, Therapeutic Exercise, UE/LE Strength taining/ROM, Skin care/wound management, Functional mobility training, Community reintegration, Discharge planning, Neuromuscular re-education, Splinting/orthotics, UE/LE Coordination activities  OT Interventions Balance/vestibular training, Discharge planning, Pain management, Self Care/advanced ADL retraining, Therapeutic Activities, UE/LE Coordination activities, Community reintegration, Fish farm manager, Psychosocial support, UE/LE Strength taining/ROM, Therapeutic Exercise, Patient/family education, Functional mobility training  SLP Interventions Speech/Language facilitation, Patient/family education  TR Interventions    SW/CM Interventions Discharge Planning, Psychosocial Support, Patient/Family Education   Barriers to Discharge MD  Medical stability and Home enviroment access/loayout  Nursing Inaccessible home environment, Medical stability    PT Inaccessible home environment, Home environment access/layout 5 steps to enter house  OT Medical stability, Home environment access/layout pt reported her daughter is at home cleaning because she thinks pt is a Merchant navy officer  SLP      SW       Team Discharge Planning: Destination: PT-Home ,OT- Home , SLP-Home Projected Follow-up: PT-Home health PT, OT-  Home health OT, SLP-None Projected Equipment Needs: PT-To be determined, OT- To be determined, SLP-None recommended by SLP Equipment Details: PT- , OT-  Patient/family involved in discharge planning: PT- Patient,  OT-Patient, SLP-Patient, Family member/caregiver  MD ELOS: 13-20d Medical Rehab Prognosis:  Good Assessment:  67 y.o.right handedfemalewith  history of hypertension, severe COPD with tobacco abuse. Per chart review patient lives with spouse. Husband can assist as  needed as well as daughter. Reported to be independent prior to admission. Presented 09/26/2017 with increasing shortness of breath and cough she failed BiPAP and required intubation.WBC 21,900.Placed on broad-spectrum antibiotics.hospital course prolonged intubation requiring tracheostomy tube 10/10/2017 downsize to a #4 cuffless anddecannulated02/09/2017.Pneumothorax requiring chest tube that was removed 10/13/2017. Currently on a regular diet.Completed a 5 day course of Zosyn for right lower lobe infiltrate.Subcutaneous heparin for DVT prophylaxis.Fitted with bilateral PRAFO boots.Acute on chronic anemia 10.9 and monitored. Physical occupational therapy evaluations completed 10/14/2017 with recommendations of physical medicine rehabilitation consult  Now requiring 24/7 Rehab RN,MD, as well as CIR level PT, OT and SLP.  Treatment team will focus on ADLs and mobility with goals set at [facility]  See Team Conference Notes for weekly updates to the plan of care

## 2017-10-29 NOTE — Progress Notes (Signed)
Physical Therapy Session Note  Patient Details  Name: Sally [name] MRN: [id number] Date of Birth: [birth year]  Today's Date: 10/29/2017 PT Individual Time: 0800-0900 PT Individual Time Calculation (min): 60 min   Short Term Goals: Week 1:  PT Short Term Goal 1 (Week 1): Pt will tolerate sitting up in w/c 1 hour in between therapies w/o increase in fatigue.  PT Short Term Goal 2 (Week 1): Pt will initiate standing w/ LRAD PT Short Term Goal 3 (Week 1): Pt will self-propel w/c 150' w/ supervision for endurance PT Short Term Goal 4 (Week 1): Pt will transfer via stand pivot w/ max assist using LRAD  Skilled Therapeutic Interventions/Progress Updates:    Tx focused on functional mobility training,therex and activity tolerance, and NMR via manual facilitation and multi-modal cues for functional balance training. Pt resting in bed upon arrival. Reviewed eval day and goals for PT.   Supine therex with tactile cues and AAROM for ankle PF/DF x10, heel slides x10, shoulder raises x10, bridging x10 with cues for breathing, lower trunk rotations x10.   Rolling with S bil and cues for sequence. Supine>sit with Mod A to lift trunk.  Sitting balance EOB x6 min with S and postural cues.  Sliding board transfer with Mod/Max A to advance hips and manage equipment.  WC propulsion in controlled setting x100' with up to Min A Standing frame x6 min on 2  L 02 02 96% HR up to 122bpm. Pt was initially very challenged with standing, heavy UE reliance and labored breathing, but eased into it with relaxation and LE activation. Reported strong bil calf stretch.  Pt left up in [place] with all needs in reach.   Therapy Documentation Precautions:  Precautions Precautions: Fall Restrictions Weight Bearing Restrictions: No General:   Vital Signs:   Pain: Pain Assessment Pain Assessment: 0-10 Pain Score: 6  Pain Location: Back Pain Descriptors / Indicators: Aching Pain Onset: Gradual Pain Intervention(s):  Medication (See eMAR) M  See Function Navigator for Current Functional Status.   Therapy/Group: Individual Therapy  Tiwana Chavis, Chrisandra Netters, PT, DPT  10/29/2017, 8:09 AM

## 2017-10-29 NOTE — Progress Notes (Signed)
*  Preliminary Results* Bilateral lower extremity venous duplex completed. The right lower extremity is negative for deep vein thrombosis.  The left lower extremity is positive for acute deep vein thrombosis involving a small segment of a single left peroneal.  There is no evidence of Baker's cyst bilaterally.  Incidental finding: there is an anechoic area of the right medial thigh measuring >5.1cm. Etiology is unknown.   Preliminary results discussed with Morrie Sheldon, RN.  10/29/2017 2:36 PM Gertie Fey, BS, RVT, RDCS, RDMS

## 2017-10-29 NOTE — Progress Notes (Signed)
Complained of back pain, chronic issue, PRN norco given. Rated pain "8" on pain scale. Complained of right calf pain, (+) pp, no redness observed, no edema. Wears PRAFO boots intermittently during night, "they're too hot". [name] A

## 2017-10-30 ENCOUNTER — Inpatient Hospital Stay (HOSPITAL_COMMUNITY): Payer: Medicare Other | Admitting: Occupational Therapy

## 2017-10-30 ENCOUNTER — Inpatient Hospital Stay (HOSPITAL_COMMUNITY): Payer: Medicare Other

## 2017-10-30 LAB — COMPREHENSIVE METABOLIC PANEL
ALK PHOS: 93 U/L (ref 38–126)
ALT: 43 U/L (ref 14–54)
ANION GAP: 14 (ref 5–15)
AST: 24 U/L (ref 15–41)
Albumin: 3 g/dL — ABNORMAL LOW (ref 3.5–5.0)
BUN: 14 mg/dL (ref 6–20)
CALCIUM: 9.1 mg/dL (ref 8.9–10.3)
CO2: 24 mmol/L (ref 22–32)
Chloride: 100 mmol/L — ABNORMAL LOW (ref 101–111)
Creatinine, Ser: 0.56 mg/dL (ref 0.44–1.00)
Glucose, Bld: 126 mg/dL — ABNORMAL HIGH (ref 65–99)
Potassium: 3.5 mmol/L (ref 3.5–5.1)
Sodium: 138 mmol/L (ref 135–145)
TOTAL PROTEIN: 5.7 g/dL — AB (ref 6.5–8.1)
Total Bilirubin: 0.5 mg/dL (ref 0.3–1.2)

## 2017-10-30 LAB — CBC WITH DIFFERENTIAL/PLATELET
Basophils Absolute: 0 10*3/uL (ref 0.0–0.1)
Basophils Relative: 0 %
EOS ABS: 0.1 10*3/uL (ref 0.0–0.7)
Eosinophils Relative: 1 %
HEMATOCRIT: 36.1 % (ref 36.0–46.0)
HEMOGLOBIN: 11.7 g/dL — AB (ref 12.0–15.0)
LYMPHS ABS: 4.9 10*3/uL — AB (ref 0.7–4.0)
Lymphocytes Relative: 34 %
MCH: 32.1 pg (ref 26.0–34.0)
MCHC: 32.4 g/dL (ref 30.0–36.0)
MCV: 98.9 fL (ref 78.0–100.0)
MONO ABS: 0.7 10*3/uL (ref 0.1–1.0)
MONOS PCT: 5 %
NEUTROS ABS: 8.5 10*3/uL — AB (ref 1.7–7.7)
NEUTROS PCT: 60 %
Platelets: 399 10*3/uL (ref 150–400)
RBC: 3.65 MIL/uL — ABNORMAL LOW (ref 3.87–5.11)
RDW: 18.2 % — ABNORMAL HIGH (ref 11.5–15.5)
WBC: 14.2 10*3/uL — ABNORMAL HIGH (ref 4.0–10.5)

## 2017-10-30 MED ORDER — FLUCONAZOLE 150 MG PO TABS
150.0000 mg | ORAL_TABLET | ORAL | Status: AC
  Administered 2017-10-30: 150 mg via ORAL
  Filled 2017-10-30: qty 1

## 2017-10-30 MED ORDER — METHOCARBAMOL 500 MG PO TABS
500.0000 mg | ORAL_TABLET | Freq: Four times a day (QID) | ORAL | Status: DC | PRN
  Administered 2017-11-03 – 2017-11-17 (×20): 500 mg via ORAL
  Filled 2017-10-30 (×23): qty 1

## 2017-10-30 NOTE — Progress Notes (Signed)
Retta Diones, RN  Rehab Admission Coordinator  Physical Medicine and Rehabilitation  PMR Pre-admission  Signed  Date of Service:  10/27/2017 4:35 PM       Related encounter: ED to Hosp-Admission (Discharged) from 09/26/2017 in [place]            [] Hide copied text  [] Hover for details   PMR Admission Coordinator Pre-Admission Assessment  Patient: Sally [name] is an 67 y.o., female MRN: [id number] DOB: [birth year] Height: 5' 1"  (154.9 cm) Weight: 69.3 kg (152 lb 12.5 oz)                                                                                                                          Insurance Information HMO:  Yes    PPO:       PCP:       IPA:       80/20:       OTHER:   PRIMARY:  [employer]HC medicare      Policy#: [id number]      Subscriber: [name] CM Name: Renato Gails      Phone#: [phone]      Fax#: [phone] Pre-Cert#: [id number]      Employer: Retired Benefits:  Phone #: [phone]     Name:  On line Eff. 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 Per chart review patient lives with spouse. Husband can assist as needed as well as daughter. Reported to  be independent prior to admission. Presented 09/26/2017 with increasing shortness of breath and cough she failed BiPAP and required intubation.WBC 21,900.Placed on broad-spectrum antibiotics.hospital course prolonged intubation requiring tracheostomy tube 10/10/2017 downsize to a #4 cuffless anddecannulated02/09/2017.Pneumothorax requiring chest tube that was removed 10/13/2017. Currently on a regular diet.Completed a 5 day course of Zosyn for right lower lobe infiltrate.Subcutaneous heparin for DVT prophylaxis.Fitted with bilateral PRAFO boots.Acute on chronic anemia 10.9 and monitored. Physical occupational therapy evaluations completed 10/14/2017 with recommendations of physical medicine rehabilitation consult.Patient to be admitted for a comprehensive inpatient rehabilitation  program.  Past Medical History      Past Medical History:  Diagnosis Date  . Asthma   . Back pain   . COPD (chronic obstructive pulmonary disease) ([place])   . Crohn disease ([place])   . Hypertension     Family History  family history includes Hypertension in her other.  Prior Rehab/Hospitalizations: No previous rehab.  Has the patient had major surgery during 100 days prior to admission? No  Current Medications   Current Facility-Administered Medications:  .  acetaminophen (TYLENOL) tablet 650 mg, 650 mg, Oral, Q6H PRN, Bonnielee Haff, MD, 650 mg at 10/23/17 1553 .  arformoterol (BROVANA) nebulizer solution 15 mcg, 15 mcg, Nebulization, BID, Kathi Ludwig, MD, 15 mcg at 10/27/17 0755 .  aspirin chewable tablet 81 mg, 81 mg, Oral, Daily, Bonnielee Haff, MD, 81 mg at 10/27/17 1028 .  budesonide (PULMICORT) nebulizer solution 0.5 mg, 0.5 mg, Nebulization, BID, Kathi Ludwig, MD, 0.5 mg at 10/27/17 0755 .  chlorhexidine gluconate (MEDLINE KIT) (PERIDEX) 0.12 % solution 15 mL, 15 mL, Mouth Rinse, BID, McQuaid, Douglas B, MD, 15 mL at 10/27/17 0804 .  Chlorhexidine Gluconate Cloth 2 % PADS  6 each, 6 each, Topical, Daily, Simonne Maffucci B, MD, 6 each at 10/27/17 1029 .  diltiazem (CARDIZEM) tablet 30 mg, 30 mg, Oral, Q8H, Bonnielee Haff, MD, 30 mg at 10/27/17 1328 .  famotidine (PEPCID) tablet 20 mg, 20 mg, Oral, BID, Bonnielee Haff, MD, 20 mg at 10/27/17 1028 .  fluconazole (DIFLUCAN) tablet 150 mg, 150 mg, Oral, Weekly, Dana Allan I, MD, 150 mg at 10/25/17 1234 .  gi cocktail (Maalox,Lidocaine,Donnatal), 30 mL, Per Tube, TID PRN, [name], MD, 30 mL at 10/17/17 0839 .  guaiFENesin (ROBITUSSIN) 100 MG/5ML solution 100 mg, 5 mL, Per Tube, Q4H PRN, Bonnielee Haff, MD, 100 mg at 10/19/17 1407 .  heparin injection 5,000 Units, 5,000 Units, Subcutaneous, Q8H, McQuaid, Douglas B, MD, 5,000 Units at 10/27/17 1328 .  hydrALAZINE (APRESOLINE) injection 10 mg, 10 mg, Intravenous, Q6H PRN, Katherine Roan, MD, 10 mg at 10/06/17 2217 .  HYDROcodone-acetaminophen (NORCO) 7.5-325 MG per tablet 1 tablet, 1 tablet, Oral, Q6H PRN, Bonnielee Haff, MD, 1 tablet at 10/26/17 1451 .  LORazepam (ATIVAN) tablet 0.5 mg, 0.5 mg, Oral, Q4H PRN, Bonnielee Haff, MD, 0.5 mg at 10/27/17 0803 .  MEDLINE mouth rinse, 15 mL, Mouth Rinse, QID, McQuaid, Douglas B, MD, 15 mL at 10/27/17 1329 .  morphine 4 MG/ML injection 1 mg, 1 mg, Intravenous, Q4H PRN, Bonnielee Haff, MD, 1 mg at 10/21/17 1749 .  nicotine (NICODERM CQ - dosed in mg/24 hours) patch 21 mg, 21 mg, Transdermal, Daily, Kara Mead V, MD, 21 mg at 10/27/17 1029 .  ondansetron (ZOFRAN) injection 4 mg, 4 mg, Intravenous, Q6H PRN, Simonne Maffucci B, MD, 4 mg at 10/17/17 0454 .  [COMPLETED] predniSONE (DELTASONE) tablet 40 mg, 40 mg, Per Tube, Q breakfast, 40 mg at 10/15/17 0800 **FOLLOWED BY** [COMPLETED] predniSONE (DELTASONE) tablet 30 mg, 30 mg, Oral, Q breakfast, 30 mg at 10/20/17 0807 **FOLLOWED BY** [COMPLETED] predniSONE (DELTASONE) tablet 20 mg, 20 mg, Oral, Q breakfast, 20 mg at 10/25/17 0733 **FOLLOWED BY** predniSONE  (DELTASONE) tablet 10 mg, 10 mg, Oral, Q breakfast, 10 mg at 10/27/17 0804 **FOLLOWED BY** [START ON 10/31/2017] predniSONE (DELTASONE) tablet 5 mg, 5 mg, Oral, Q breakfast, McClung, Kimberlee Nearing, MD .  RESOURCE Zena Amos, , Oral, PRN, Bonnielee Haff, MD .  senna (SENOKOT) tablet 8.6 mg, 1 tablet, Oral, Daily  PRN, Bonnielee Haff, MD .  sodium chloride flush (NS) 0.9 % injection 10-40 mL, 10-40 mL, Intracatheter, PRN, Juanito Doom, MD  Patients Current Diet: Diet regular Room service appropriate? Yes; Fluid consistency: Thin  Precautions / Restrictions Precautions Precautions: Fall, Other (comment) Precaution Comments: trach capped Restrictions Weight Bearing Restrictions: No   Has the patient had 2 or more falls or a fall with injury in the past year?No  Prior Activity Level Community (5-7x/wk): Went out daily, ran errands, was driving.  Home Assistive Devices / Equipment Home Assistive Devices/Equipment: None(shower chair PRN )  Prior Device Use: Indicate devices/aids used by the patient prior to current illness, exacerbation or injury? None  Prior Functional Level Prior Function Comments: unsure  Self Care: Did the patient need help bathing, dressing, using the toilet or eating?  Independent  Indoor Mobility: Did the patient need assistance with walking from room to room (with or without device)? Independent  Stairs: Did the patient need assistance with internal or external stairs (with or without device)? Independent  Functional Cognition: Did the patient need help planning regular tasks such as shopping or remembering to take medications? Independent  Current Functional Level Cognition  Overall Cognitive Status: Within Functional Limits for tasks assessed Difficult to assess due to: (trach capped) Orientation Level: Oriented X4 General Comments: pt talking to communicate, reports her husband can handle her at home    Extremity  Assessment (includes Sensation/Coordination)  Upper Extremity Assessment: Generalized weakness  Lower Extremity Assessment: Generalized weakness(pt with drop foot bilaterally, no active ankle DF bilat)    ADLs  Overall ADL's : Needs assistance/impaired Eating/Feeding: Set up, Supervision/ safety, Bed level Grooming: Wash/dry hands, Wash/dry face, Bed level, Set up Toileting- Clothing Manipulation and Hygiene: Total assistance, Bed level General ADL Comments: Pt with high anxiety and fear of falling.     Mobility  Overal bed mobility: Needs Assistance Bed Mobility: Supine to Sit Rolling: +2 for physical assistance, Mod assist Sidelying to sit: +2 for physical assistance, Mod assist Supine to sit: +2 for physical assistance, Mod assist, Min assist Sit to supine: Min assist General bed mobility comments: Assisted with LEs to EOB.  Pt pulled up on PT with mod assist.     Transfers  Overall transfer level: Needs assistance Equipment used: Ambulation equipment used Transfer via Lift Equipment: Marketing executive Transfers: Sit to/from Stand Sit to Stand: +2 safety/equipment, Max assist, Mod assist General transfer comment: Used Clarise Cruz plus to stand pt with pt assisting movement.  Pt needed intermittent cues to activate hip extension and trunk extension as she seemed to do better than last treatment.  Pt stood 6 min.  Pt was able to weight shift left and right and lift foot up off of the platform.  Pt did buckle a few times but knee plate was in place.  Pt also states her feet feel "tingly" today as opposed to previously no feeling.  Encouraged nursing to use the Clarise Cruz plus for all transfers so that pt could stand more during the day and they agree.      Ambulation / Gait / Stairs / Office manager / Balance Dynamic Sitting Balance Sitting balance - Comments: in long sitting Balance Overall balance assessment: Needs assistance Sitting-balance support: Feet supported, No  upper extremity supported Sitting balance-Leahy Scale: Fair Sitting balance - Comments: in long sitting Postural control: Posterior lean Standing balance support: During functional activity, Bilateral upper extremity supported Standing balance-Leahy Scale: Poor Standing balance  comment: Had to have bil UE support and max assist to come to standing in [place] plus.  Cues to maintain standing for up to 6 minutes.  Ability to weight shift.     Special needs/care consideration BiPAP/CPAP No CPM No Continuous Drip IV  Dialysis No       Life Vest No Oxygen:  Has 02 3L [place] on at this time, but none at home Special Bed No Trach Size Decannulated 10/27/17 Wound Vac (area) No     Skin:  Has dressing to trach decannulation site on neck.  Has yeast infection per patient.  Bottom is "raw" per patient.                            Bowel mgmt: Patient reports last BM today, 10/27/17 Bladder mgmt: Voiding on bedpan Diabetic mgmt No    Previous Home Environment Living Arrangements: Spouse/significant other Home Care Services: No Additional Comments: Decannulated today 10/27/17  Discharge Living Setting Plans for Discharge Living Setting: Patient's home, House, Lives with (comment)(Lives with husband, daughter and 84 yo grandchild.) Type of Home at Discharge: House Discharge Home Layout: One level Discharge Home Access: Stairs to enter Entrance Stairs-Number of Steps: 5 step entry Does the patient have any problems obtaining your medications?: No  Social/Family/Support Systems Patient Roles: Spouse, Parent(Has a husband, daughters and a 23 yr old grandchild.) Contact Information: Kelsei Defino - spouse Anticipated Caregiver: husband Anticipated Caregiver's Contact Information: Kennith [place] - husband - [phone] Ability/Limitations of Caregiver: Husband is retired/disabled and can assist. Caregiver Availability: Evenings only Discharge Plan Discussed with Primary Caregiver: Yes Is Caregiver In  Agreement with Plan?: Yes Does Caregiver/Family have Issues with Lodging/Transportation while Pt is in Rehab?: No  Goals/Additional Needs Patient/Family Goal for Rehab: PT mod I and supervision, OT supervision to min assist goals Expected length of stay: 13-20 days Cultural Considerations: Patient does not like foul language used in her presence Dietary Needs: Regular diet, thin liquids Equipment Needs: TBD Pt/Family Agrees to Admission and willing to participate: Yes Program Orientation Provided & Reviewed with Pt/Caregiver Including Roles  & Responsibilities: Yes  Decrease burden of Care through IP rehab admission: N/A  Possible need for SNF placement upon discharge: Not anticipated  Patient Condition: This patient's condition remains as documented in the consult dated 10/26/17, in which the Rehabilitation Physician determined and documented that the patient's condition is appropriate for intensive rehabilitative care in an inpatient rehabilitation facility. Will admit to inpatient rehab today.  Preadmission Screen Completed By:  Retta Diones, 10/27/2017 4:47 PM ______________________________________________________________________   Discussed status with Dr.  Naaman Plummer on 10/27/17 at 1647 and received telephone approval for admission today.  Admission Coordinator:  Retta Diones, time 1647/Date 10/27/17             Cosigned by: Meredith Staggers, MD at 10/27/2017 5:04 PM  Revision History

## 2017-10-30 NOTE — Progress Notes (Signed)
Occupational Therapy Session Note  Patient Details  Name: Sally [name] MRN: [id number] Date of Birth: Feb 18, 1951  Today's Date: 10/30/2017 OT Individual Time: 0930-1030 OT Individual Time Calculation (min): 60 min    Short Term Goals: Week 1:  OT Short Term Goal 1 (Week 1): Pt will transfer to toilet with mod A with use of slide board.  OT Short Term Goal 2 (Week 1): Pt will perform UB dressing with mod A. OT Short Term Goal 3 (Week 1): Pt will engage in 10 minutes of functional task with 2 rest breaks or less secondary to fatigue.  Skilled Therapeutic Interventions/Progress Updates:    Upon entering the room, pt on bedpan with daughter present in room. Pt having BM this session with min A and use of bedrail to roll L <> R. Pt required total A for hygiene and clothing management with multiple rolls required to complete this task. Pt with c/o extreme fatigue this session and declined OOB activities and therapeutic exercise. OT notifying RN and OT placing foam patch to IV tubing to maintain skin integrity around neck folds. Pt remained in bed at end of session with call bell and all needed items within reach. Bed alarm activated.    Therapy Documentation Precautions:  Precautions Precautions: Fall Restrictions Weight Bearing Restrictions: No General:   Vital Signs: Oxygen Therapy SpO2: 95 % O2 Device: Nasal Cannula O2 Flow Rate (L/min): 1 L/min Pain: Pain Assessment Pain Assessment: 0-10 Pain Score: 0-No painOther Treatments:    See Function Navigator for Current Functional Status.   Therapy/Group: Individual Therapy  Alen Bleacher 10/30/2017, 12:49 PM

## 2017-10-30 NOTE — Progress Notes (Signed)
Patient information reviewed and entered into eRehab system by Ula Sarra Rachels, RN, CRRN, PPS Coordinator.  Information including medical coding and functional independence measure will be reviewed and updated through discharge.     Per nursing patient was given "Data Collection Information Summary for Patients in Inpatient Rehabilitation Facilities with attached "Privacy Act Statement-Health Care Records" upon admission.  

## 2017-10-30 NOTE — Progress Notes (Signed)
Occupational Therapy Session Note  Patient Details  Name: Sally [name] MRN: [id number] Date of Birth: 11/05/1950  Today's Date: 10/30/2017 OT Individual Time: 1445-1530 OT Individual Time Calculation (min): 45 min    Short Term Goals: Week 1:  OT Short Term Goal 1 (Week 1): Pt will transfer to toilet with mod A with use of slide board.  OT Short Term Goal 2 (Week 1): Pt will perform UB dressing with mod A. OT Short Term Goal 3 (Week 1): Pt will engage in 10 minutes of functional task with 2 rest breaks or less secondary to fatigue.  Skilled Therapeutic Interventions/Progress Updates:    Pt greeted via OT handoff in dayroom. Requesting to complete therapy while seated due to LE pain. She self propelled back to room in w/c for strengthening UB. Pt then engaged in grooming/oral care tasks w/c level at sink for improving activity tolerance. Also washed her hair, with pt assisting OT during blow-drying and hair brushing. Pt easily fatigued by these tasks, reporting "I need a break. That's a workout!" At end of tx she appeared less anxious/more relaxed than at start of session. Pt was positioned in w/c at bedside and left with all needs within reach.   Therapy Documentation Precautions:  Precautions Precautions: Fall Restrictions Weight Bearing Restrictions: No Pain: Pain Assessment Pain Assessment: 0-10 Pain Score: 8  Faces Pain Scale: Hurts a little bit Pain Type: Acute pain Pain Location: Back Pain Intervention(s): Medication (See eMAR) ADL:   See Function Navigator for Current Functional Status.   Therapy/Group: Individual Therapy  Khaiden Segreto A Reva Pinkley 10/30/2017, 4:05 PM

## 2017-10-30 NOTE — Progress Notes (Signed)
[facility] Individual Statement of Services  Patient Name:  Sally [name]  Date:  10/30/2017  Welcome to the [facility].  Our goal is to provide you with an individualized program based on your diagnosis and situation, designed to meet your specific needs.  With this comprehensive rehabilitation program, you will be expected to participate in at least 3 hours of rehabilitation therapies Monday-Friday, with modified therapy programming on the weekends.  Your rehabilitation program will include the following services:  Physical Therapy (PT), Occupational Therapy (OT), Speech Therapy (ST), 24 hour per day rehabilitation nursing, Neuropsychology, Case Management (Social Worker), Rehabilitation Medicine, Nutrition Services and Pharmacy Services  Weekly team conferences will be held on Wednesdays to discuss your progress.  Your Social Worker will talk with you frequently to get your input and to update you on team discussions.  Team conferences with you and your family in attendance may also be held.  Expected length of stay:  17 to 21 days  Overall anticipated outcome:  Minimal assistance  Depending on your progress and recovery, your program may change. Your Social Worker will coordinate services and will keep you informed of any changes. Your Social Worker's name and contact numbers are listed  below.  The following services may also be recommended but are not provided by the [facility]:   Driving Evaluations  Home Health Rehabiltiation Services  Outpatient Rehabilitation Services   Arrangements will be made to provide these services after discharge if needed.  Arrangements include referral to agencies that provide these services.  Your insurance has been verified to be:  [employer] Your primary doctor is:  Dr. Dennis Bast  Pertinent information will be shared with your doctor and your insurance company.  Social  Worker:  Staci Acosta, LCSW  [phone] or ([phone]  Information discussed with and copy given to patient by: Elvera Lennox, 10/30/2017, 12:02 PM

## 2017-10-30 NOTE — Progress Notes (Signed)
Ranelle Oyster, MD  Physician  Physical Medicine and Rehabilitation  Consult Note  Addendum  Date of Service:  10/26/2017 8:57 AM       Related encounter: ED to Hosp-Admission (Discharged) from 09/26/2017 in [facility]      Expand All Collapse All       [] Hide copied text  [] Hover for details        Physical Medicine and Rehabilitation Consult Reason for Consult:Decreased functional mobility Referring Physician: Critical care  HPI: Sally [name] is a 67 y.o.right handed female with history of hypertension, severe COPD with tobacco abuse. Per chart review patient lives with spouse. Husband can assist as needed as well as daughter. Reported to be independent prior to admission. Presented 09/26/2017 with increasing shortness of breath and cough she failed BiPAP and required intubation.WBC 21,900.Placed on broad-spectrum antibiotics.hospital course prolonged intubation requiring tracheostomy tube 10/10/2017 downsize to a #4 cuffless and plan decannulation 10/27/2017.Pneumothorax requiring chest tube that was removed 10/13/2017. Currently on a mechanical soft thin liquid diet. Subcutaneous heparin for DVT prophylaxis.Acute on chronic anemia 10.9 and monitored. Physical occupational therapy evaluations completed 10/14/2017  with recommendations of physical medicine rehabilitation consult.   Review of Systems  Constitutional: Negative for chills and fever.  HENT: Negative for hearing loss.   Eyes: Negative for blurred vision and double vision.  Respiratory: Positive for cough and shortness of breath.   Cardiovascular: Positive for leg swelling. Negative for chest pain and palpitations.  Gastrointestinal: Positive for constipation. Negative for nausea and vomiting.  Genitourinary: Negative for dysuria, flank pain and hematuria.  Musculoskeletal: Positive for back pain.  Skin: Negative for rash.  Neurological: Positive for weakness. Negative for seizures.   All other systems reviewed and are negative.      Past Medical History:  Diagnosis Date  . Asthma   . Back pain   . COPD (chronic obstructive pulmonary disease) (HCC)   . Crohn disease (HCC)   . Hypertension         Past Surgical History:  Procedure Laterality Date  . ABDOMINAL HYSTERECTOMY    . CATARACT EXTRACTION, BILATERAL    . FRACTURE SURGERY Left    ARM        Family History  Problem Relation Age of Onset  . Hypertension Other    Social History:  reports that she has been smoking.  she has never used smokeless tobacco. She reports that she does not drink alcohol or use drugs. Allergies: No Known Allergies       Medications Prior to Admission  Medication Sig Dispense Refill  . albuterol (PROVENTIL) (2.5 MG/3ML) 0.083% nebulizer solution Take 2.5 mg by nebulization every 6 (six) hours as needed for wheezing or shortness of breath.   5  . [EXPIRED] cefdinir (OMNICEF) 300 MG capsule Take 300 mg by mouth 2 (two) times daily.    . methylPREDNISolone (MEDROL DOSEPAK) 4 MG TBPK tablet Take by mouth.    Marland Kitchen albuterol (PROVENTIL HFA;VENTOLIN HFA) 108 (90 Base) MCG/ACT inhaler Inhale 1-2 puffs into the lungs every 6 (six) hours as needed for wheezing or shortness of breath.    Ailene Ards ELLIPTA 62.5-25 MCG/INH AEPB Inhale 1 puff into the lungs daily.   11  . aspirin EC 81 MG tablet Take 81 mg by mouth daily.    Marland Kitchen atorvastatin (LIPITOR) 20 MG tablet Take 20 mg by mouth at bedtime.    Marland Kitchen DILT-XR 240 MG 24 hr capsule Take 240 mg by mouth daily.  11  . DULoxetine (CYMBALTA) 30 MG capsule Take 1 capsule (30 mg total) by mouth daily. 30 capsule 3  . FLOVENT HFA 110 MCG/ACT inhaler Inhale 2 puffs into the lungs every 12 (twelve) hours.   11  . hydrOXYzine (VISTARIL) 25 MG capsule Take 1 capsule (25 mg total) by mouth 3 (three) times daily as needed for anxiety. 30 capsule 2  . predniSONE (DELTASONE) 20 MG tablet Take 2 tablets (40 mg total) by mouth daily with  breakfast. 6 tablet 0    Home: Home Living Family/patient expects to be discharged to:: Unsure Living Arrangements: Spouse/significant other Additional Comments: Pt with trach in place, no family caregivers to provide any information  Functional History: Prior Function Comments: unsure Functional Status:  Mobility: Bed Mobility Overal bed mobility: Needs Assistance Bed Mobility: Supine to Sit Rolling: +2 for physical assistance, Mod assist Sidelying to sit: +2 for physical assistance, Mod assist Supine to sit: +2 for physical assistance, Mod assist, Min assist Sit to supine: Min assist General bed mobility comments: Assisted with LEs to EOB.  Pt pulled up on PT with mod assist.  Transfers Overall transfer level: Needs assistance Equipment used: Ambulation equipment used Transfer via Lift Equipment: Hydrographic surveyor Transfers: Sit to/from Stand Sit to Stand: +2 safety/equipment, Max assist, Mod assist General transfer comment: Used [place] Dec plus to stand pt with pt assisting movement.  Pt needed constant cues to activate hip extension and trunk extension.  The longer pt stood the better she did.  Pt stood 3 min and rested.  Then she stood 4 minutes.  Asked nursing to use [place] Dec plus for transfers so that pt could stand more during the day.    ADL: ADL Overall ADL's : Needs assistance/impaired Eating/Feeding: Set up, Supervision/ safety, Bed level Grooming: Wash/dry hands, Wash/dry face, Bed level, Set up Toileting- Clothing Manipulation and Hygiene: Total assistance, Bed level General ADL Comments: Pt with high anxiety and fear of falling.   Cognition: Cognition Overall Cognitive Status: Within Functional Limits for tasks assessed Orientation Level: Oriented X4 Cognition Arousal/Alertness: Awake/alert Behavior During Therapy: Anxious Overall Cognitive Status: Within Functional Limits for tasks assessed General Comments: pt talking to communicate, reports her husband can handle her  at home Difficult to assess due to: (trach capped)  Blood pressure (!) 143/67, pulse 73, temperature 98 F (36.7 C), temperature source Oral, resp. rate 11, height 5\' 1"  (1.549 m), weight 69.3 kg (152 lb 12.5 oz), SpO2 100 %. Physical Exam  Vitals reviewed. Constitutional: She is oriented to person, place, and time.  HENT:  Head: Normocephalic.  Eyes: EOM are normal.  Neck: Normal range of motion. Neck supple.  Tracheostomy is capped  Cardiovascular: Normal rate and regular rhythm.  Respiratory:  Decreased breath sounds but clear to auscultation  GI: Soft. Bowel sounds are normal. She exhibits no distension.  Neurological: She is alert and oriented to person, place, and time.  UE 3+ 4-/5 prox to 4-/5 distally. LE: 3+ HF, KE and 3PF, 0 to tr ADF bilaterally. Sensory loss from mid calf down bilaterally. Some sensory loss in fingers also  Skin: Skin is warm and dry.         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 Does the need for close, 24 hr/day medical supervision in concert with the patient's rehab needs make it unreasonable for this patient to be served in a less intensive setting? Yes 2. Co-Morbidities requiring supervision/potential complications: trach, COPD, htn,  3. Due to bladder management, bowel management, safety, skin/wound care, disease management, medication administration, pain management and patient education, does the patient require 24 hr/day rehab nursing? Yes 4. Does the patient require coordinated care of a physician, rehab nurse, PT (1-2 hrs/day, 5 days/week), OT (1-2 hrs/day, 5 days/week) and SLP (1-2 hrs/day, 5 days/week) to address physical and functional deficits in the context of the above medical diagnosis(es)? Yes Addressing deficits in the following areas: balance, endurance, locomotion, strength, transferring, bowel/bladder control, bathing, dressing, feeding,  grooming, toileting, speech, swallowing and psychosocial support 5. Can the patient actively participate in an intensive therapy program of at least 3 hrs of therapy per day at least 5 days per week? Yes 6. The potential for patient to make measurable gains while on inpatient rehab is excellent 7. Anticipated functional outcomes upon discharge from inpatient rehab are modified independent and supervision  with PT, modified independent, supervision and min assist with OT, modified independent with SLP. 8. Estimated rehab length of stay to reach the above functional goals is: 13-20 days 9. Anticipated D/C setting: Home 10. Anticipated post D/C treatments: HH therapy and Outpatient therapy 11. Overall Rehab/Functional Prognosis: excellent  RECOMMENDATIONS: This patient's condition is appropriate for continued rehabilitative care in the following setting: CIR Patient has agreed to participate in recommended program. Yes Note that insurance prior authorization may be required for reimbursement for recommended care.  Comment: Rehab Admissions Coordinator to follow up. Ordered bilateral PRAFO's to support feet/ankles and help prevent skin preakdown  Thanks,  Ranelle Oyster, MD, FAAPMR    Mcarthur Rossetti Angiulli, PA-C 10/26/2017      Revision History                             Routing History

## 2017-10-30 NOTE — Plan of Care (Signed)
  Not Applicable RH SAFETY RH OTHER STG SAFETY GOALS W/ASSIST Description Other STG Safety Goals With Assistance. 10/30/2017 0304 - Not Applicable by Martina Sinner, RN 10/30/2017 0303 - Progressing by Martina Sinner, RN RH PAIN MANAGEMENT RH OTHER STG PAIN MANAGEMENT GOALS W/ASSIST Description Other STG Pain Management Goals With Assistance. 10/30/2017 0303 - Not Applicable by Martina Sinner, RN

## 2017-10-30 NOTE — Progress Notes (Signed)
Speech Language Pathology Daily Session Note  Patient Details  Name: Elizah Lydon MRN: [id number] Date of Birth: 04/29/1951  Today's Date: 10/30/2017 SLP Individual Time: 1545-1600 SLP Individual Time Calculation (min): 15 min  Short Term Goals: Week 1: SLP Short Term Goal 1 (Week 1): Pt will utilize speech intelligibility strategies to achieve ~ 90 intelligibility at the sentence level in a mildly noisey environment.  SLP Short Term Goal 2 (Week 1): Pt will answer [facility] questions regarding vocal hygiene with supervision cues.  SLP Short Term Goal 3 (Week 1): Pt will complete RMST assessment when medically cleared.   Skilled Therapeutic Interventions: Skilled treatment session focused on speech communication goals (specifically voice). Pt steadily talking without producing any voice d/t air escaping thru stoma. Pt with no initiative to cover bandage with 2 fingers. When cued, pt states she is covering d/t fatigue. Pt able to demonstrate how to place 2 finger pressure and obtain good voicing. Will follow up with MD regarding appropriateness of RMST. Pt was left upright in wheelchair with all needs within reach. Continue per current plan of care.      Function:    Cognition Comprehension Comprehension assist level: Follows complex conversation/direction with extra time/assistive device  Expression   Expression assist level: Expresses basic 50 - 74% of the time/requires cueing 25 - 49% of the time. Needs to repeat parts of sentences.;Expresses basic 75 - 89% of the time/requires cueing 10 - 24% of the time. Needs helper to occlude trach/needs to repeat words.  Social Interaction Social Interaction assist level: Interacts appropriately with others - No medications needed.  Problem Solving Problem solving assist level: Solves complex problems: Recognizes & self-corrects  Memory Memory assist level: Complete Independence: No helper    Pain Pain Assessment Pain Assessment: 0-10 Pain Score: 8   Faces Pain Scale: Hurts a little bit Pain Type: Acute pain Pain Location: Back Pain Intervention(s): Medication (See eMAR)  Therapy/Group: Individual Therapy  Chey Rachels 10/30/2017, 4:12 PM

## 2017-10-30 NOTE — Progress Notes (Signed)
Social Work Assessment and Plan  Patient Details  Name: Sally [name] MRN: [id number] Date of Birth: 1951/07/07  Today's Date: 10/30/2017  Problem List:  Patient Active Problem List   Diagnosis Date Noted  . Critical illness myopathy   . 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 Leukocytosis   . Tracheostomy status ([place])   . Hypoxemia   . Pneumothorax, traumatic   . Acute respiratory failure ([place])   . Influenza A   . Acute on chronic respiratory failure ([place])   . Acute respiratory failure with hypoxemia ([place]) 09/26/2017  . RSV (acute bronchiolitis due to respiratory syncytial virus) 10/06/2016  . COPD exacerbation ([place]) 07/14/2016  . Acute respiratory failure with hypoxia and hypercapnia (HCC)   . Essential hypertension   . Dyslipidemia    Past Medical History:  Past Medical History:  Diagnosis Date  . Asthma   . Back pain   . COPD (chronic obstructive pulmonary disease) ([place])   . Crohn disease ([place])   . Hypertension    Past Surgical History:  Past Surgical History:  Procedure Laterality Date  . ABDOMINAL HYSTERECTOMY    . CATARACT EXTRACTION, BILATERAL    . FRACTURE SURGERY Left    ARM   Social History:  reports that she has been smoking.  she has never used smokeless tobacco. She reports that she does not drink alcohol or use drugs.  Family / Support Systems Marital Status: Married How Long?: 42 years Patient Roles: Spouse, Parent Spouse/Significant Other: Cortnee Steinmiller- husband - [phone] Children: Otillia Cordone, Brooke Bonito. - son - [phone]; Lovena Neighbours - dtr in [facility] - [phone]; Christ Kick - dtr who lives with pt - [phone]; Kallie Depolo - dtr in [place] (youngest) - [phone] Anticipated Caregiver: husband and dtrs(intermittently) Ability/Limitations of Caregiver: Husband is retired/disabled and can assist. Caregiver Availability: 24/7 Family Dynamics: close, supportive family   Social History Preferred  language: English Religion: Baptist Education: CNA courses Read: Yes Write: Yes Employment Status: Retired(CNA) Date Retired/Disabled/Unemployed: 4 Age Retired: 6 Legal History/Current Legal Issues: none reported Guardian/Conservator: N/A - MD has determined that pt is capable of making her own decisions.   Abuse/Neglect Abuse/Neglect Assessment Can Be Completed: Yes Physical Abuse: Denies Verbal Abuse: Denies Sexual Abuse: Denies Exploitation of patient/patient's resources: Denies Self-Neglect: Denies  Emotional Status Pt's affect, behavior and adjustment status: Pt reports her spirits are usually high and she is motivated to work hard and get better.  She admits to having a "self pity party" every once in a while and cries about the situation, but then feels better and is able to move on.  Pt was emotional as she told CSW this and added that she can't help but to break down when she talks about how grateful she is to be alive. Recent Psychosocial Issues: Pt and husband both had the flu when pt's illness began.  She has been in the hospital for over a month now, and is ready to work hard and get home. Psychiatric History: Pt with a hx of anxiety.  Feels it has decreased since she's moved to CIR. Substance Abuse History: none reported  Patient / Family Perceptions, Expectations & Goals Pt/Family understanding of illness & functional limitations: Pt/dtr reported a good understanding of pt's condition and current limitations.  Pt would like to progress to a level where she can take care of herself again. Premorbid pt/family roles/activities: Pt enjoys sewing, cooking, crocheting, and spending time with her family. Anticipated changes in roles/activities/participation:  Pt would like to adapt her home so that she can continue doing the above. Pt/family expectations/goals: Pt wants to be able to walk.  She knows it will take a lot of work and some time, but "needs to be able to walk  again."  [employer]: None Premorbid Home Care/DME Agencies: None Transportation available at discharge: family Resource referrals recommended: Neuropsychology  Discharge Planning Living Arrangements: Spouse/significant other, Children, Other (Comment)(grandchild) Support Systems: Spouse/significant other, Children, Other relatives, Friends/neighbors Type of Residence: Private residence Insurance Resources: Multimedia programmer (specify)([employer] engineer) Financial Resources: Social Security, Family Support Financial Screen Referred: No Money Management: Patient, Spouse Does the patient have any problems obtaining your medications?: No Home Management: Pt, spouse, and dtr usually share household responsibilities, but dtr and husband can manage while pt recovers. Patient/Family Preliminary Plans: Pt plans to go to her home where her husband will be with her 24/7 and dtrs will assist intermittently.  Barnett Applebaum will be in and out and Katharine Look lives there, but works full time so can help in the evenings/Saturday. Social Work Anticipated Follow Up Needs: HH/OP Expected length of stay: 17 to 21 days  Clinical Impression CSW met with pt and her dtr Katharine Look) to introduce self and role of CSW, as well as to complete assessment.  Pt was very talkative and pleasant with CSW and is grateful to be on CIR.  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  Pt's husband is planning to be her caregiver with dtrs assisting as they can.  Pt has good support, per their report.  Pt admits to being emotional when she talks about how grateful she is to be alive.  She also states that she cries sometimes when she feels self pity, but that it makes her feel better and then she is able to move forward.  CSW provided support and normalized this for pt with everything she's been through and explained that if it becomes worse than that, to let CSW know so that we  could provide resources.  Pt was appreciative and reports great support from her family and that she has a lot to live for.  Pt is hopeful to walk some by d/c and wants to adapt her kitchen to be able to cook, if she needs to sit down to do so.  CSW to meet pt's husband and will discuss d/c plans with pt and husband together.  CSW remains available to assist as needed.  Kanylah Muench, Silvestre Mesi 10/30/2017, 12:43 PM

## 2017-10-30 NOTE — Progress Notes (Signed)
Occupational Therapy Session Note  Patient Details  Name: Sally [name] MRN: [id number] Date of Birth: 05/23/1951  Today's Date: 10/30/2017 OT Individual Time: 1345-1430 OT Individual Time Calculation (min): 45 min    Short Term Goals: Week 1:  OT Short Term Goal 1 (Week 1): Pt will transfer to toilet with mod A with use of slide board.  OT Short Term Goal 2 (Week 1): Pt will perform UB dressing with mod A. OT Short Term Goal 3 (Week 1): Pt will engage in 10 minutes of functional task with 2 rest breaks or less secondary to fatigue.  Skilled Therapeutic Interventions/Progress Updates:    1:1. Pt tearful with pain in B calves upon arrival. No redness, however warm to touch, slightly swollen and pain with calf squeeze. RN alerted. OT elected to participate in seated activities. Pt pains seated edge of chair to facilitate anterior weight flexion and work on sitting balance and R shoulder strengthening while patient paints. Pt says "this is relaxing" and "my pain was over a 10/10 but is a 7/10 now." OT discusses leisure pursuits and patients preferred acitivities. Exited session with pt handed off to next OT.   Therapy Documentation Precautions:  Precautions Precautions: Fall Restrictions Weight Bearing Restrictions: No General:  See Function Navigator for Current Functional Status.   Therapy/Group: Individual Therapy  Shon Hale 10/30/2017, 2:37 PM

## 2017-10-30 NOTE — Progress Notes (Signed)
Subjective/Complaints:  No lower ext pain, no LE wswelling  Review of systems denies chest pain, shortness of breath, nausea, diarrhea, constipation Objective: Vital Signs: Blood pressure (!) 156/70, pulse (!) 102, temperature 97.7 F (36.5 C), temperature source Oral, resp. rate 15, height 5\' 2"  (1.575 m), weight 71.4 kg (157 lb 6.5 oz), SpO2 97 %. No results found. Results for orders placed or performed during the hospital encounter of 10/27/17 (from the past 72 hour(s))  Glucose, capillary     Status: None   Collection Time: 10/28/17  6:58 AM  Result Value Ref Range   Glucose-Capillary 84 65 - 99 mg/dL  Glucose, capillary     Status: Abnormal   Collection Time: 10/28/17 11:51 AM  Result Value Ref Range   Glucose-Capillary 124 (H) 65 - 99 mg/dL  Glucose, capillary     Status: Abnormal   Collection Time: 10/28/17  4:44 PM  Result Value Ref Range   Glucose-Capillary 104 (H) 65 - 99 mg/dL  Glucose, capillary     Status: Abnormal   Collection Time: 10/29/17 11:46 AM  Result Value Ref Range   Glucose-Capillary 107 (H) 65 - 99 mg/dL  Glucose, capillary     Status: Abnormal   Collection Time: 10/29/17  4:38 PM  Result Value Ref Range   Glucose-Capillary 111 (H) 65 - 99 mg/dL     HEENT: Trach site with foam dressing Cardio: RRR and No murmurs Resp: CTA B/L and Unlabored GI: BS positive and Nontender nondistended Extremity:  No Edema, neg Homan's in BLE, no TTP, no erythema Skin:   Intact Neuro: Alert/Oriented, Abnormal Sensory Reduced sensation in the toes and Abnormal Motor 2- bilateral hip flexor 3- knee extensors 0 at the ankle dorsiflexors 1 at the ankle plantar flexors  Musc/Skel:  Other No pain with upper extremity lower extremity range of motion General no acute distress   Assessment/Plan: 1. 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 Physiatrist is providing close team supervision and 24 hour management of active medical problems listed below. Physiatrist and rehab team continue to assess barriers to discharge/monitor patient progress toward functional and medical goals. FIM: Function - Bathing Bathing activity did not occur: Refused Position: Wheelchair/chair at sink  Function- Upper Body Dressing/Undressing Upper body dressing/undressing activity did not occur: Refused What is the patient wearing?: Hospital gown Function - Lower Body Dressing/Undressing What is the patient wearing?: Non-skid slipper socks Non-skid slipper socks- Performed by helper: Don/doff right sock, Don/doff left sock Assist for footwear: Dependant  Function - Toileting Toileting activity did not occur: No continent bowel/bladder event     Function - Chair/bed transfer Chair/bed transfer method: Lateral scoot Chair/bed transfer assist level: Maximal assist (Pt 25 - 49%/lift and lower) Chair/bed transfer assistive device: Sliding board Chair/bed transfer details: Verbal cues for safe use of DME/AE, Verbal cues for technique, Manual facilitation for placement, Manual facilitation for weight shifting, Verbal cues for precautions/safety  Function - Locomotion: Wheelchair Will patient use wheelchair at discharge?: (TBD) Type: Manual Max wheelchair distance: 100 Assist Level: Touching or steadying assistance (Pt > 75%) Assist Level: Touching or steadying assistance (Pt > 75%) Wheel 150 feet activity did not occur: Safety/medical concerns Turns around,maneuvers to table,bed, and toilet,negotiates 3% grade,maneuvers on rugs and over doorsills: No Function - Locomotion: Ambulation Ambulation activity did not occur: Safety/medical concerns Walk 10 feet activity did not occur: Safety/medical concerns Walk 50 feet with 2  turns activity did not occur: Safety/medical concerns Walk 150 feet activity did not occur: Safety/medical  concerns Walk 10 feet on uneven surfaces activity did not occur: Safety/medical concerns  Function - Comprehension Comprehension: Auditory Comprehension assist level: Follows complex conversation/direction with extra time/assistive device  Function - Expression Expression: Verbal Expression assist level: Expresses complex ideas: With no assist  Function - Social Interaction Social Interaction assist level: Interacts appropriately with others - No medications needed.  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 DVT Prophylaxis/Anticoagulation: Subcutaneous heparin.Check vascular study- small isolated asymptomatic left peroneal  DVT, repeat doppler in 5-7 D  3. Pain Management:Hydrocodone as needed. -schedule tylenol bid -kpad 4. Mood:Ativan 0.5 mg every 4 hours as needed.Provide emotional support 5. Neuropsych: This patientiscapable of making decisions on herown behalf. 6. Skin/Wound Care:Routine skin checks 7. Fluids/Electrolytes/Nutrition:Routine I&O's with follow-up chemistries, intake low so far 760 mL's on 10/28/2017, improved to on 2/3 8.Severe COPD with tobacco abuse. Continue nebulizers. Provide counseling regarding cessation of nicotine products.NicoDerm patch as well as prednisone taper, No SOB 9.Right-sided pneumothorax.  Chest tube removed 10/13/2017, no shortness of breath 10. S/p Trach: decannulated. Continue compressive dressing daily, some air leakage, as d/w pt this will heal slowly,  LOS (Days) 3 A FACE TO FACE EVALUATION WAS PERFORMED  Erick Colace 10/30/2017, 7:19 AM

## 2017-10-30 NOTE — Plan of Care (Signed)
  Not Progressing RH PAIN MANAGEMENT RH STG PAIN MANAGED AT OR BELOW PT'S PAIN GOAL Description Less than 3 out of 10  Consistently rating pain > 3. 10/30/2017 0306 - Not Progressing by Martina Sinner, RN 10/30/2017 0304 - Progressing by Martina Sinner, RN 10/30/2017 0303 - Progressing by Martina Sinner, RN

## 2017-10-30 NOTE — Progress Notes (Signed)
Physical Therapy Session Note  Patient Details  Name: Sally [name] MRN: [id number] Date of Birth: 1951-02-25  Today's Date: 10/30/2017 PT Individual Time: 2585-2778 PT Individual Time Calculation (min): 40 min   Short Term Goals: Week 1:  PT Short Term Goal 1 (Week 1): Pt will tolerate sitting up in w/c 1 hour in between therapies w/o increase in fatigue.  PT Short Term Goal 2 (Week 1): Pt will initiate standing w/ LRAD PT Short Term Goal 3 (Week 1): Pt will self-propel w/c 150' w/ supervision for endurance PT Short Term Goal 4 (Week 1): Pt will transfer via stand pivot w/ max assist using LRAD  Skilled Therapeutic Interventions/Progress Updates:    Pt able to bridge for repositioning of brief prior to OOB. Utilizing [facility] elevated and bed rails, pt able to come to EOB with min assist and maintained functional sitting balance with supervision. Reports mild orthostasis which resolved in a few minutes. Lateral leans for placement of chuck pad for skin protection during slideboard transfer due to pt not having pants on. Pt able to perform with supervision using rails for support. Performed min/light mod assist slideboard transfer into w/c with cues for hand placement and technique. Encouragement provided throughout due to pt very anxious about falling. Pt able to reposition in w/c with cues. W/c mobility for functional UE strengthening and endurance with cues for technique. Seated from w/c instructed in Kinetron for functional LE strengthening in reciprocal movement pattern x 10 reps each x 10 sets. PT adjusted brake on w/c and changed out legrests to allow for improved alignment of BLE to promote increased sitting tolerance and proper positioning. Left up in w/c in dayroom until next therapy session for improved mood.   Therapy Documentation Precautions:  Precautions Precautions: Fall Restrictions Weight Bearing Restrictions: No   Vital Signs: Oxygen Therapy SpO2: 95 % O2 Device: Nasal  Cannula O2 Flow Rate (L/min): 1 L/min Pain:  Reports discomfort in groin area due to rash including burning pain and reports tingling in feet. RN was present and aware. Powder applied to groin prior to donning brief for OOB.    See Function Navigator for Current Functional Status.   Therapy/Group: Individual Therapy  Karolee Stamps Darrol Poke, PT, DPT  10/30/2017, 2:44 PM

## 2017-10-31 ENCOUNTER — Inpatient Hospital Stay (HOSPITAL_COMMUNITY): Payer: Medicare Other | Admitting: Occupational Therapy

## 2017-10-31 ENCOUNTER — Inpatient Hospital Stay (HOSPITAL_COMMUNITY): Payer: Medicare Other | Admitting: Physical Therapy

## 2017-10-31 NOTE — Progress Notes (Signed)
Occupational Therapy Session Note  Patient Details  Name: Sally [name] MRN: [id number] Date of Birth: 03-19-1951  Today's Date: 10/31/2017 OT Individual Time: 0900-1000 OT Individual Time Calculation (min): 60 min    Short Term Goals: Week 1:  OT Short Term Goal 1 (Week 1): Pt will transfer to toilet with mod A with use of slide board.  OT Short Term Goal 2 (Week 1): Pt will perform UB dressing with mod A. OT Short Term Goal 3 (Week 1): Pt will engage in 10 minutes of functional task with 2 rest breaks or less secondary to fatigue.  Skilled Therapeutic Interventions/Progress Updates:    Pt presented supine in bed, agreeable to OT tx session. Focus of session on ADL retraining, activity tolerance. Pt transfers to sitting EOB with S, tolerates sitting EOB for duration of session. Pt completes UB/LB bathing using lateral leans to wash buttocks with S during completion. Pt utilizing figure 4 technique to doff socks, requires assist to support LEs in figure 4 when donning socks and ultimately requiring assist to complete task. Pt with increased dyspnea during activity, O2 monitored and remaining >94% on 1.5L. Pt dons hospital gown and completes grooming ADLs with setup assist. Declines transfer to w/c this session, but agreeable to getting into w/c next therapy session and remaining OOB for remaining therapies. Pt reporting/demonstrating increased difficulty with gross grasp strength/FMC during grooming ADLs including squeezing lotion bottle. Participated in seated peg board activity for increased hand strengthening/coordination, completing with S. Pt returns to supine end of session with MinA where Pt was left with bed alarm set, call bell and needs within reach.   Therapy Documentation Precautions:  Precautions Precautions: Fall Restrictions Weight Bearing Restrictions: No      See Function Navigator for Current Functional Status.   Therapy/Group: Individual Therapy  Orlando Penner 10/31/2017, 9:31 AM

## 2017-10-31 NOTE — Progress Notes (Signed)
Subjective/Complaints:  Vaginal itching noted by RN, oc burning with urination no frequency  Review of systems denies chest pain, shortness of breath, nausea, diarrhea, constipation Objective: Vital Signs: Blood pressure (!) 141/61, pulse 78, temperature (!) 97.5 F (36.4 C), temperature source Oral, resp. rate 14, height 5' 2"  (1.575 m), weight 71.1 kg (156 lb 12 oz), SpO2 100 %.  Results for orders placed or performed during the hospital encounter of 10/27/17 (from the past 72 hour(s))  Glucose, capillary     Status: Abnormal   Collection Time: 10/28/17 11:51 AM  Result Value Ref Range   Glucose-Capillary 124 (H) 65 - 99 mg/dL  Glucose, capillary     Status: Abnormal   Collection Time: 10/28/17  4:44 PM  Result Value Ref Range   Glucose-Capillary 104 (H) 65 - 99 mg/dL  Glucose, capillary     Status: Abnormal   Collection Time: 10/29/17 11:46 AM  Result Value Ref Range   Glucose-Capillary 107 (H) 65 - 99 mg/dL  Glucose, capillary     Status: Abnormal   Collection Time: 10/29/17  4:38 PM  Result Value Ref Range   Glucose-Capillary 111 (H) 65 - 99 mg/dL  CBC WITH DIFFERENTIAL     Status: Abnormal   Collection Time: 10/30/17  8:24 AM  Result Value Ref Range   WBC 14.2 (H) 4.0 - 10.5 K/uL   RBC 3.65 (L) 3.87 - 5.11 MIL/uL   Hemoglobin 11.7 (L) 12.0 - 15.0 g/dL   HCT 36.1 36.0 - 46.0 %   MCV 98.9 78.0 - 100.0 fL   MCH 32.1 26.0 - 34.0 pg   MCHC 32.4 30.0 - 36.0 g/dL   RDW 18.2 (H) 11.5 - 15.5 %   Platelets 399 150 - 400 K/uL   Neutrophils Relative % 60 %   Neutro Abs 8.5 (H) 1.7 - 7.7 K/uL   Lymphocytes Relative 34 %   Lymphs Abs 4.9 (H) 0.7 - 4.0 K/uL   Monocytes Relative 5 %   Monocytes Absolute 0.7 0.1 - 1.0 K/uL   Eosinophils Relative 1 %   Eosinophils Absolute 0.1 0.0 - 0.7 K/uL   Basophils Relative 0 %   Basophils Absolute 0.0 0.0 - 0.1 K/uL    Comment: Performed at [facility], [address] [place], [place] [address]  Comprehensive metabolic panel      Status: Abnormal   Collection Time: 10/30/17  8:24 AM  Result Value Ref Range   Sodium 138 135 - 145 mmol/L   Potassium 3.5 3.5 - 5.1 mmol/L   Chloride 100 (L) 101 - 111 mmol/L   CO2 24 22 - 32 mmol/L   Glucose, Bld 126 (H) 65 - 99 mg/dL   BUN 14 6 - 20 mg/dL   Creatinine, Ser 0.56 0.44 - 1.00 mg/dL   Calcium 9.1 8.9 - 10.3 mg/dL   Total Protein 5.7 (L) 6.5 - 8.1 g/dL   Albumin 3.0 (L) 3.5 - 5.0 g/dL   AST 24 15 - 41 U/L   ALT 43 14 - 54 U/L   Alkaline Phosphatase 93 38 - 126 U/L   Total Bilirubin 0.5 0.3 - 1.2 mg/dL   GFR calc non Af Amer >60 >60 mL/min   GFR calc Af Amer >60 >60 mL/min    Comment: (NOTE) The eGFR has been calculated using the CKD EPI equation. This calculation has not been validated in all clinical situations. eGFR's persistently <60 mL/min signify possible Chronic Kidney Disease.    Anion gap 14  5 - 15    Comment: Performed at [facility], [place] [address], [place],  [address]     HEENT: Lurline Idol site with foam dressing Cardio: RRR and No murmurs Resp: CTA B/L and Unlabored GI: BS positive and Nontender nondistended Extremity:  No Edema, neg Homan's in BLE, no TTP, no erythema Skin:   Intact Neuro: Alert/Oriented, Abnormal Sensory Reduced sensation in the toes and Abnormal Motor 2- bilateral hip flexor 3- knee extensors 0 at the ankle dorsiflexors 1 at the ankle plantar flexors  Musc/Skel:  Other No pain with upper extremity lower extremity range of motion General no acute distress   Assessment/Plan: 1. 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 Physiatrist is providing close team supervision and 24 hour management of active medical problems listed below. Physiatrist and rehab team continue to assess barriers to discharge/monitor patient progress toward functional and medical goals. FIM: Function -  Bathing Bathing activity did not occur: Refused Position: Wheelchair/chair at sink  Function- Upper Body Dressing/Undressing Upper body dressing/undressing activity did not occur: Refused What is the patient wearing?: Hospital gown Function - Lower Body Dressing/Undressing What is the patient wearing?: Non-skid slipper socks Non-skid slipper socks- Performed by helper: Don/doff right sock, Don/doff left sock Assist for footwear: Dependant  Function - Toileting Toileting activity did not occur: No continent bowel/bladder event Toileting steps completed by patient: Adjust clothing prior to toileting, Adjust clothing after toileting Toileting steps completed by helper: Performs perineal hygiene Assist level: Touching or steadying assistance (Pt.75%)(per Thea Alken, NT report)  Function - Air cabin crew transfer assistive device: Bedside commode Assist level to toilet: Moderate assist (Pt 50 - 74%/lift or lower)  Function - Chair/bed transfer Chair/bed transfer method: Lateral scoot Chair/bed transfer assist level: Moderate assist (Pt 50 - 74%/lift or lower) Chair/bed transfer assistive device: Sliding board, Armrests Chair/bed transfer details: Verbal cues for safe use of DME/AE, Verbal cues for technique, Manual facilitation for placement, Manual facilitation for weight shifting, Verbal cues for precautions/safety  Function - Locomotion: Wheelchair Will patient use wheelchair at discharge?: (TBD) Type: Manual Max wheelchair distance: 120' Assist Level: Supervision or verbal cues Assist Level: Supervision or verbal cues Wheel 150 feet activity did not occur: Safety/medical concerns Turns around,maneuvers to table,bed, and toilet,negotiates 3% grade,maneuvers on rugs and over doorsills: No Function - Locomotion: Ambulation Ambulation activity did not occur: Safety/medical concerns Walk 10 feet activity did not occur: Safety/medical concerns Walk 50 feet with 2 turns  activity did not occur: Safety/medical concerns Walk 150 feet activity did not occur: Safety/medical concerns Walk 10 feet on uneven surfaces activity did not occur: Safety/medical concerns  Function - Comprehension Comprehension: Auditory Comprehension assist level: Follows complex conversation/direction with extra time/assistive device  Function - Expression Expression: Verbal Expression assist level: Expresses basic 50 - 74% of the time/requires cueing 25 - 49% of the time. Needs to repeat parts of sentences., Expresses basic 75 - 89% of the time/requires cueing 10 - 24% of the time. Needs helper to occlude trach/needs to repeat words.  Function - Social Interaction Social Interaction assist level: Interacts appropriately with others - No medications needed.  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 DVT Prophylaxis/Anticoagulation: Subcutaneous heparin.Check vascular study- small isolated asymptomatic left peroneal  DVT, repeat doppler in 5-7 D, discussed R medial thigh anechoic area seen on doppler will reassess at repeat , exam is neg no complaints of pain or swelling in thigh  3. Pain Management:Hydrocodone as needed. -schedule tylenol bid -kpad 4. Mood:Ativan 0.5 mg every 4 hours as needed.Provide emotional support 5. Neuropsych: This  patientiscapable of making decisions on herown behalf. 6. Skin/Wound Care:Routine skin checks 7. Fluids/Electrolytes/Nutrition:Routine I&O's with follow-up chemistries, intake low so far 760 mL's on 10/28/2017, improved to 1053m on 2/4 8.Severe COPD with tobacco abuse. Continue nebulizers. Provide counseling regarding cessation of nicotine products.NicoDerm patch as well as prednisone taper, No SOB 9.Right-sided pneumothorax. Chest tube removed 10/13/2017, no shortness of breath 10. S/p Trach: decannulated. Continue compressive dressing daily, some air leakage, as d/w pt this will heal slowly, 11.  Leukocytosis likely steroid related, afebrile 12.  Vaginal itching some improvement no rash in peri area, has received Diflucan x 1 which is usual dose for vaginal candidiasis LOS (Days) 4 A FACE TO FACE EVALUATION WAS PERFORMED  ACharlett Blake2/01/2018, 7:11 AM

## 2017-10-31 NOTE — Progress Notes (Addendum)
Occupational Therapy Session Note  Patient Details  Name: Sally [name] MRN: [id number] Date of Birth: 1951-07-16  Today's Date: 10/31/2017 OT Individual Time: 1400-1430 OT Individual Time Calculation (min): 30 min    Short Term Goals: Week 1:  OT Short Term Goal 1 (Week 1): Pt will transfer to toilet with mod A with use of slide board.  OT Short Term Goal 2 (Week 1): Pt will perform UB dressing with mod A. OT Short Term Goal 3 (Week 1): Pt will engage in 10 minutes of functional task with 2 rest breaks or less secondary to fatigue.  Skilled Therapeutic Interventions/Progress Updates:    Pt seen for OT session focusing on education and bed mobility. Pt in supine upon arrival, voicing increased fatigue but willing to participate as able. With encouragement, pt willing to don pants in order to ease slide board transfer. Pants donned at bed level with assist for threading, pt able  To roll/ bridge in order to pull pants up. Upon this activity, pt states she was feeling "woozy" from pain meds and did not feel it was safe to get OOB as well as refusing to come sitting EOB. She then voiced need for urgent toileting task and refusing BSC, requesting bed pan. Pt positioned on bed pan and HOB raised to simulate upright toileting task. While pt on bed pan, extensive discussion regarding pt's goals, home set-up, and d/c planning. Pt verbalizes being very motivated to return to ambulatory level, however, at this time is not willing to attempt much past her comfort zone. She admitted to being very anxious PTA and thinking this will affect her in therapy. Encouragement, empathetic listening, and education provided. Pt left on bedpan at end of session, RN made aware of pt's position.  Theraband placed on bed rails with education and demonstration provided for bed level UE strengthening exercises btwn sessions.   This therapist spoke with RN following session, RN reports pt has received Tylenol only today, no  medication side effects should be expected at this time.   Therapy Documentation Precautions:  Precautions Precautions: Fall Restrictions Weight Bearing Restrictions: No Pain:   No/ denies pain  See Function Navigator for Current Functional Status.   Therapy/Group: Individual Therapy  Kristle Wesch L 10/31/2017, 7:20 AM

## 2017-10-31 NOTE — Progress Notes (Signed)
Physical Therapy Session Note  Patient Details  Name: Sally [name] MRN: [id number] Date of Birth: 10-Nov-1950  Today's Date: 10/31/2017 PT Individual Time: 3403-5248 PT Individual Time Calculation (min): 58 min   Short Term Goals: Week 1:  PT Short Term Goal 1 (Week 1): Pt will tolerate sitting up in w/c 1 hour in between therapies w/o increase in fatigue.  PT Short Term Goal 2 (Week 1): Pt will initiate standing w/ LRAD PT Short Term Goal 3 (Week 1): Pt will self-propel w/c 150' w/ supervision for endurance PT Short Term Goal 4 (Week 1): Pt will transfer via stand pivot w/ max assist using LRAD  Skilled Therapeutic Interventions/Progress Updates:   Pt in supine and required max motivation to participate in OOB therapy. Pt very talkative regarding her situation, what she can and can't do, and her goals to walk again. Provided encouragement and support regarding things that are going well for her including tolerating standing a few days ago and being able to participate in therapy at all. Educated her on typical progression of endurance related impairments in light of her comorbidities and that it will take time to build endurance Pt appreciative of encouragement and education. Transferred to EOB w/ supervision and transferred to w/c via slide board transfer w/ min assist and increased time, required many rest breaks 2/2 anxiety. Total assist w/c transport to/from gym and performed 10 sets of LE strengthening exercise on kinetron at lowest resistance to build LE strength in reciprocal movement pattern. 10 sets of 5 reps each per leg. Returned to room and transferred back to EOB and to supine via same technique and assist level. Ended session in supine, call bell within reach and all needs met.  Therapy Documentation Precautions:  Precautions Precautions: Fall Restrictions Weight Bearing Restrictions: No Vital Signs: Therapy Vitals Temp: 97.7 F (36.5 C) Temp Source: Oral Pulse Rate:  96 Resp: 18 BP: (!) 157/63 Patient Position (if appropriate): Lying Oxygen Therapy SpO2: 96 % O2 Device: Nasal Cannula O2 Flow Rate (L/min): 1.5 L/min Pain: Pain Assessment Faces Pain Scale: Hurts a little bit  See Function Navigator for Current Functional Status.   Therapy/Group: Individual Therapy  Khilee Hendricksen K Arnette 10/31/2017, 3:59 PM

## 2017-10-31 NOTE — Progress Notes (Signed)
Occupational Therapy Session Note  Patient Details  Name: Sally [name] MRN: [id number] Date of Birth: Jun 17, 1951  Today's Date: 10/31/2017 OT Individual Time: 1115-1200 OT Individual Time Calculation (min): 45 min    Short Term Goals: Week 1:  OT Short Term Goal 1 (Week 1): Pt will transfer to toilet with mod A with use of slide board.  OT Short Term Goal 2 (Week 1): Pt will perform UB dressing with mod A. OT Short Term Goal 3 (Week 1): Pt will engage in 10 minutes of functional task with 2 rest breaks or less secondary to fatigue.  Skilled Therapeutic Interventions/Progress Updates:    1:1 Pt in bed when arrived and required encouragement for out of bed activity.  Pt reported wanted to use the bed pan however with encouragement pt willing to try squat/ scoot pivot to drop arm BSC. Pt required mod A with more than reasonable amt of time. Pt able to void and perform hygiene in sitting. Introduced the [employer] to assist with transfers to promote upright posture, LE and core strengthening. Pt  able to come into standing with mod A. And then come into standing for perched position with min guard to transition into recliner. Encouraged her to sit up a least an hr before requesting to return to bed. Pt continues to be on one liter of O2.   Therapy Documentation Precautions:  Precautions Precautions: Fall Restrictions Weight Bearing Restrictions: No Pain: Pain Assessment Faces Pain Scale: Hurts a little bit  Bilateral feet are sensitive.   See Function Navigator for Current Functional Status.   Therapy/Group: Individual Therapy  Roney Mans [facility] 10/31/2017, 1:57 PM

## 2017-11-01 ENCOUNTER — Inpatient Hospital Stay (HOSPITAL_COMMUNITY): Payer: Medicare Other | Admitting: Speech Pathology

## 2017-11-01 ENCOUNTER — Encounter (HOSPITAL_COMMUNITY): Payer: Medicare Other | Admitting: Psychology

## 2017-11-01 ENCOUNTER — Inpatient Hospital Stay (HOSPITAL_COMMUNITY): Payer: Medicare Other | Admitting: Physical Therapy

## 2017-11-01 ENCOUNTER — Inpatient Hospital Stay (HOSPITAL_COMMUNITY): Payer: Medicare Other | Admitting: Occupational Therapy

## 2017-11-01 LAB — BLOOD GAS, ARTERIAL
Acid-Base Excess: 5.7 mmol/L — ABNORMAL HIGH (ref 0.0–2.0)
BICARBONATE: 29.4 mmol/L — AB (ref 20.0–28.0)
DRAWN BY: 51155
FIO2: 40
PEEP: 5 cmH2O
Patient temperature: 98.6
RATE: 18 resp/min
VT: 500 mL
pCO2 arterial: 41 mmHg (ref 32.0–48.0)
pH, Arterial: 7.469 — ABNORMAL HIGH (ref 7.350–7.450)
pO2, Arterial: 111 mmHg — ABNORMAL HIGH (ref 83.0–108.0)

## 2017-11-01 NOTE — Progress Notes (Signed)
Speech Language Pathology Daily Session Note  Patient Details  Name: Sally [name] MRN: [id number] Date of Birth: Feb 18, 1951  Today's Date: 11/01/2017 SLP Individual Time: 1300-1400 SLP Individual Time Calculation (min): 60 min  Short Term Goals: Week 1: SLP Short Term Goal 1 (Week 1): Pt will utilize speech intelligibility strategies to achieve ~ 90 intelligibility at the sentence level in a mildly noisey environment.  SLP Short Term Goal 2 (Week 1): Pt will answer [facility] questions regarding vocal hygiene with supervision cues.  SLP Short Term Goal 3 (Week 1): Pt will complete RMST assessment when medically cleared.   Skilled Therapeutic Interventions: SLP facilitated session by providing RMST assessment, MD cleared pt for assessment if pt places 2 fingers to apply pressure to bandage covering stoma. Pt obtained a value of 43 cmH2O for expiratory and 37 cmH2O for inspiratory pressure. Pt's expiratory value was below reference score (75 cmH2O) and lower level of normal (63 cmH2O). Pt's inspiratory pressure was above the lower level of normal (29 cmH2O) but below the reference value of 68 cmH2O for a female of her age. Would recommend targeting EMST d/t deficit and IMST within context of fatigue with multiple repititions. Education provided to pt and daughter. All questions answered to their satisfaction. Continue per current plan of care.    Function:  Eating Eating   Modified Consistency Diet: No Eating Assist Level: No help, No cues           Cognition Comprehension Comprehension assist level: Follows basic conversation/direction with no assist;Understands complex 90% of the time/cues 10% of the time  Expression   Expression assist level: Expresses basic needs/ideas: With no assist;Expresses basic needs/ideas: With extra time/assistive device(d/t fatigue and vocal quality)  Social Interaction Social Interaction assist level: Interacts appropriately 75 - 89% of the time - Needs redirection  for appropriate language or to initiate interaction.  Problem Solving Problem solving assist level: Solves complex 90% of the time/cues < 10% of the time  Memory Memory assist level: More than reasonable amount of time    Pain Pain Assessment Pain Assessment: 0-10 Pain Score: 4  Pain Type: Chronic pain Pain Location: Back Pain Orientation: Mid Pain Descriptors / Indicators: Aching Pain Onset: On-going Patients Stated Pain Goal: 2 Pain Intervention(s): Medication (See eMAR)  Therapy/Group: Individual Therapy  Alyna Stensland 11/01/2017, 3:04 PM

## 2017-11-01 NOTE — Consult Note (Signed)
Neuropsychological Consultation   Patient:   Sally [name]   DOB:   07/22/1951  MR Number:  [id number]  Location:  [facility] [id number] [place] [address] Dept: [phone] Loc: [phone]           Date of Service:   11/01/2017  Start Time:   9:30 Am End Time:   10:30 AM  Provider/Observer:  Arley Phenix, Psy.D.       Clinical Neuropsychologist       Billing Code/Service: [phone] 4 Units  Chief Complaint:    Sally [name] is a 67 year old female with history of hypertension, severe COPD with tobacco abuse.  Presented 09/26/2017 with increasing shortness of breath and cough she failed BiPAP and required intubation.WBC 21,900.Placed on broad-spectrum antibiotics.hospital course prolonged intubation requiring tracheostomy tube 10/10/2017 downsize to a #4 cuffless anddecannulated02/09/2017.Pneumothorax requiring chest tube that was removed 10/13/2017.  Patient has prior history of anxiety symptoms/disorder.  She reports distressing worry/fear of falling.  She has not fallen here on the unit.    Reason for Service:  The patient was referred for neuropsycholoigcal consultation due to anxiety/worry/coping issues.  Below is the HPI for the current admission.  Sally [name] a 67 y.o.right handedfemalewith history of hypertension, severe COPD with tobacco abuse. Per chart review patient lives with spouse. Husband can assist as needed as well as daughter. Reported to be independent prior to admission. Presented 09/26/2017 with increasing shortness of breath and cough she failed BiPAP and required intubation.WBC 21,900.Placed on broad-spectrum antibiotics.hospital course prolonged intubation requiring tracheostomy tube 10/10/2017 downsize to a #4 cuffless anddecannulated02/09/2017.Pneumothorax requiring chest tube that was removed 10/13/2017. Currently on a regular diet.Completed a 5  day course of Zosyn for right lower lobe infiltrate.Subcutaneous heparin for DVT prophylaxis.Fitted with bilateral PRAFO boots.Acute on chronic anemia 10.9 and monitored. Physical occupational therapy evaluations completed 10/14/2017 with recommendations of physical medicine rehabilitation consult.Patient was admitted for a comprehensive rehabilitation program.  Current Status:  Patient reports continued anxiety and worry.  She feels stronger and is hoping that as she improves medically that her worry will reduce.  The patient reports that her big fear is during transfer.  Behavioral Observation: Sally [name]  presents as a 67 y.o.-year-old Right Caucasian Female who appeared her stated age. her dress was Appropriate and she was Well Groomed and her manners were Appropriate to the situation.  her participation was indicative of Appropriate and Attentive behaviors.  There were any physical disabilities noted.  she displayed an appropriate level of cooperation and motivation.     Interactions:    Active Appropriate  Attention:   abnormal and attention span appeared shorter than expected for age but not severe.  Memory:   within normal limits; recent and remote memory intact  Visuo-spatial:  not examined  Speech (Volume):  low  Speech:   normal; normal  Thought Process:  Coherent and Relevant  Though Content:  WNL; not suicidal  Orientation:   person, place, time/date and situation  Judgment:   Good  Planning:   Good  Affect:    Anxious  Mood:    Anxious  Insight:   Good  Intelligence:   normal  Medical History:   Past Medical History:  Diagnosis Date  . Asthma   . Back pain   . COPD (chronic obstructive pulmonary disease) (HCC)   . Crohn disease (HCC)   . Hypertension        Psychiatric  History:  While patient does not have prior history of anxiety in medical records, this is likely a long standing condition.  Family Med/Psych History:  Family History  Problem  Relation Age of Onset  . Hypertension Other     Impression/DX:  Sally [name] is a 67 year old female with history of hypertension, severe COPD with tobacco abuse.  Presented 09/26/2017 with increasing shortness of breath and cough she failed BiPAP and required intubation.WBC 21,900.Placed on broad-spectrum antibiotics.hospital course prolonged intubation requiring tracheostomy tube 10/10/2017 downsize to a #4 cuffless anddecannulated02/09/2017.Pneumothorax requiring chest tube that was removed 10/13/2017.  Patient has prior history of anxiety symptoms/disorder.  She reports distressing worry/fear of falling.  She has not fallen here on the unit.    Patient reports continued anxiety and worry.  She feels stronger and is hoping that as she improves medically that her worry will reduce.  The patient reports that her big fear is during transfer.  Disposition/Plan:  Will see the patient again first of next week to continue to work on coping and adjustment  Issues around anxiety/fear.  Diagnosis:    Anxiety State        Electronically Signed   _______________________ Arley Phenix, Psy.D.

## 2017-11-01 NOTE — Progress Notes (Addendum)
Occupational Therapy Session Note  Patient Details  Name: Sally [name] MRN: [id number] Date of Birth: 09-May-1951  Today's Date: 11/01/2017 OT Individual Time: 4098-1191 OT Individual Time Calculation (min): 60 min    Short Term Goals: Week 1:  OT Short Term Goal 1 (Week 1): Pt will transfer to toilet with mod A with use of slide board.  OT Short Term Goal 2 (Week 1): Pt will perform UB dressing with mod A. OT Short Term Goal 3 (Week 1): Pt will engage in 10 minutes of functional task with 2 rest breaks or less secondary to fatigue.  Skilled Therapeutic Interventions/Progress Updates:    Pt seen for OT ADL bathing/dressing session. Pt in supine upon arrival, agreeable to tx session. From bed level, picked out clothing items in prep for bathing/dressing, pt requiring encouragement to don real clothes and education provided regarding purpose of CIR in prep for d/c home and completing ADL tasks. . She transferred to EOB with supervision using hospital bed functions. WIth encouragement and constant cuing attempted squat pivot transfer to w/c, however, due to pt's high anxiety level, she was unable to initiate transfer and opted to use sliding board instead. Completed with min A with VCs for technique. Bathing/dressing completed from w/c level at sink. She was able to don/doff B socks and thread pants with increased time. Pt requesting +2 assist in order to attempt stand due to fear of falling, stood at sink with +2 while pants pulled up total A. Pt tolerated ~20 seconds in standing before requiring seated rest break. Vitals following standing HR 104, BP 158/62 and O2 95% on RA She completed min A sliding board transfer to recliner with encouragement and education provided regarding benefits of upright tolerance/ OOB.  Pt left seated in recliner at end of session, set-up with breakfast tray and awaiting neuropsych.  Therapy Documentation Precautions:  Precautions Precautions:  Fall Restrictions Weight Bearing Restrictions: No Pain:   No/ denies pain  See Function Navigator for Current Functional Status.   Therapy/Group: Individual Therapy  Kadija Cruzen L 11/01/2017, 7:08 AM

## 2017-11-01 NOTE — Progress Notes (Signed)
Physical Therapy Session Note  Patient Details  Name: Sally [name] MRN: [id number] Date of Birth: 05/10/1951  Today's Date: 11/01/2017 PT Individual Time: 1520-1630 PT Individual Time Calculation (min): 70 min   Short Term Goals: Week 1:  PT Short Term Goal 1 (Week 1): Pt will tolerate sitting up in w/c 1 hour in between therapies w/o increase in fatigue.  PT Short Term Goal 2 (Week 1): Pt will initiate standing w/ LRAD PT Short Term Goal 3 (Week 1): Pt will self-propel w/c 150' w/ supervision for endurance PT Short Term Goal 4 (Week 1): Pt will transfer via stand pivot w/ max assist using LRAD  Skilled Therapeutic Interventions/Progress Updates:      Pt received sitting in recliner and agreeable to PT.   Squat pivot to [facility] with mod assist. Max cues for positioning and improved anterior weight shift.  WC mobility through hall of rehab unit 2x 150f with supervision assist and min cues from PT for improved symmetry of BLE to maintain straight path.   Standing frame with orthostatic assessment. Sitting: 115/62. Standing 0 min 135/64. Standing 3 min 153/98. Pt reports mild orthostatic symptoms initially that resolved after 2 min. Standing tolerance in standing frame 5 min x 3. Partial sit<>stand with 5 second hold x 8.   Sit<>stand in parallel bars x 2 with mod assist on first attempt and min assist on second attempt. Standing tolerance 10sec and 15 sec. Min assist for pt comfort for knee stability.   Pt returned to room and performed squat pivot transfer to bed with mod. Sit>supine completed with supervision assist, and pt left supine in bed with call bell in reach and all needs met.   Pt required constant encouragement and explanation of therapeutic activity due to anxiety throughout treatment.     Therapy Documentation Precautions:  Precautions Precautions: Fall Restrictions Weight Bearing Restrictions: No   Pain: Pain Assessment Pain Score: 4  Pain Type: Chronic  pain Pain Location: Back Pain Orientation: Mid Pain Descriptors / Indicators: Aching Pain Onset: On-going Patients Stated Pain Goal: 2 Pain Intervention(s): Medication (See eMAR)   See Function Navigator for Current Functional Status.   Therapy/Group: Individual Therapy  ALorie Phenix2/02/2018, 4:43 PM

## 2017-11-01 NOTE — Progress Notes (Signed)
Subjective/Complaints:  No Vaginal itching Having 2 BM per day  Review of systems denies chest pain, shortness of breath, nausea, diarrhea, constipation Objective: Vital Signs: Blood pressure 137/68, pulse 89, temperature 99 F (37.2 C), temperature source Axillary, resp. rate 20, height _0  (1.575 m), weight 70 kg (154 lb 5.2 oz), SpO2 98 %.  Results for orders placed or performed during the hospital encounter of 10/27/17 (from the past 72 hour(s))  Glucose, capillary     Status: Abnormal   Collection Time: 10/29/17 11:46 AM  Result Value Ref Range   Glucose-Capillary 107 (H) 65 - 99 mg/dL  Glucose, capillary     Status: Abnormal   Collection Time: 10/29/17  4:38 PM  Result Value Ref Range   Glucose-Capillary 111 (H) 65 - 99 mg/dL  CBC WITH DIFFERENTIAL     Status: Abnormal   Collection Time: 10/30/17  8:24 AM  Result Value Ref Range   WBC 14.2 (H) 4.0 - 10.5 K/uL   RBC 3.65 (L) 3.87 - 5.11 MIL/uL   Hemoglobin 11.7 (L) 12.0 - 15.0 g/dL   HCT 36.1 36.0 - 46.0 %   MCV 98.9 78.0 - 100.0 fL   MCH 32.1 26.0 - 34.0 pg   MCHC 32.4 30.0 - 36.0 g/dL   RDW 18.2 (H) 11.5 - 15.5 %   Platelets 399 150 - 400 K/uL   Neutrophils Relative % 60 %   Neutro Abs 8.5 (H) 1.7 - 7.7 K/uL   Lymphocytes Relative 34 %   Lymphs Abs 4.9 (H) 0.7 - 4.0 K/uL   Monocytes Relative 5 %   Monocytes Absolute 0.7 0.1 - 1.0 K/uL   Eosinophils Relative 1 %   Eosinophils Absolute 0.1 0.0 - 0.7 K/uL   Basophils Relative 0 %   Basophils Absolute 0.0 0.0 - 0.1 K/uL    Comment: Performed at [facility], [address], [place], [place] [address]  Comprehensive metabolic panel     Status: Abnormal   Collection Time: 10/30/17  8:24 AM  Result Value Ref Range   Sodium 138 135 - 145 mmol/L   Potassium 3.5 3.5 - 5.1 mmol/L   Chloride 100 (L) 101 - 111 mmol/L   CO2 24 22 - 32 mmol/L   Glucose, Bld 126 (H) 65 - 99 mg/dL   BUN 14 6 - 20 mg/dL   Creatinine, Ser 0.56 0.44 - 1.00 mg/dL   Calcium 9.1 8.9 -  10.3 mg/dL   Total Protein 5.7 (L) 6.5 - 8.1 g/dL   Albumin 3.0 (L) 3.5 - 5.0 g/dL   AST 24 15 - 41 U/L   ALT 43 14 - 54 U/L   Alkaline Phosphatase 93 38 - 126 U/L   Total Bilirubin 0.5 0.3 - 1.2 mg/dL   GFR calc non Af Amer >60 >60 mL/min   GFR calc Af Amer >60 >60 mL/min    Comment: (NOTE) The eGFR has been calculated using the CKD EPI equation. This calculation has not been validated in all clinical situations. eGFR's persistently <60 mL/min signify possible Chronic Kidney Disease.    Anion gap 14 5 - 15    Comment: Performed at [facility] [address], [place], [place] [address]     HEENT: Lurline Idol site with foam dressing Cardio: RRR and No murmurs Resp: CTA B/L and Unlabored GI: BS positive and Nontender nondistended Extremity:  No Edema, neg Homan's in BLE, no TTP, no erythema Skin:   Intact Neuro: Alert/Oriented, Abnormal Sensory  Reduced sensation in the toes and Abnormal Motor 2- bilateral hip flexor 3- knee extensors 0 at the ankle dorsiflexors 1 at the ankle plantar flexors  Musc/Skel:  Other No pain with upper extremity lower extremity range of motion General no acute distress   Assessment/Plan: 1. 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 Physiatrist is providing close team supervision and 24 hour management of active medical problems listed below. Physiatrist and rehab team continue to assess barriers to discharge/monitor patient progress toward functional and medical goals. FIM: Function - Bathing Bathing activity did not occur: Refused Position: Sitting EOB Body parts bathed by patient: Right arm, Left arm, Chest, Abdomen, Front perineal area, Buttocks, Right upper leg, Left upper leg, Right lower leg, Left lower leg Body parts bathed by helper: Back Assist Level: Touching or steadying assistance(Pt > 75%)  Function-  Upper Body Dressing/Undressing Upper body dressing/undressing activity did not occur: Refused What is the patient wearing?: Hospital gown Assist Level: Supervision or verbal cues Function - Lower Body Dressing/Undressing What is the patient wearing?: Non-skid slipper socks Non-skid slipper socks- Performed by patient: Don/doff right sock, Don/doff left sock(doff) Non-skid slipper socks- Performed by helper: Don/doff right sock, Don/doff left sock(don) Assist for footwear: Partial/moderate assist  Function - Toileting Toileting activity did not occur: No continent bowel/bladder event Toileting steps completed by patient: Adjust clothing prior to toileting, Adjust clothing after toileting Toileting steps completed by helper: Performs perineal hygiene Assist level: Touching or steadying assistance (Pt.75%)(per [employer], NT report)  Function - Air cabin crew transfer assistive device: Bedside commode Assist level to toilet: Moderate assist (Pt 50 - 74%/lift or lower)  Function - Chair/bed transfer Chair/bed transfer method: Lateral scoot Chair/bed transfer assist level: Touching or steadying assistance (Pt > 75%) Chair/bed transfer assistive device: Sliding board, Armrests Chair/bed transfer details: Verbal cues for technique, Manual facilitation for placement, Manual facilitation for weight shifting, Verbal cues for precautions/safety  Function - Locomotion: Wheelchair Will patient use wheelchair at discharge?: (TBD) Type: Manual Max wheelchair distance: 120' Assist Level: Supervision or verbal cues Assist Level: Supervision or verbal cues Wheel 150 feet activity did not occur: Safety/medical concerns Turns around,maneuvers to table,bed, and toilet,negotiates 3% grade,maneuvers on rugs and over doorsills: No Function - Locomotion: Ambulation Ambulation activity did not occur: Safety/medical concerns Walk 10 feet activity did not occur: Safety/medical concerns Walk 50  feet with 2 turns activity did not occur: Safety/medical concerns Walk 150 feet activity did not occur: Safety/medical concerns Walk 10 feet on uneven surfaces activity did not occur: Safety/medical concerns  Function - Comprehension Comprehension: Auditory Comprehension assist level: Follows complex conversation/direction with extra time/assistive device  Function - Expression Expression: Verbal Expression assist level: Expresses complex ideas: With extra time/assistive device  Function - Social Interaction Social Interaction assist level: Interacts appropriately with others with medication or extra time (anti-anxiety, antidepressant).  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  Formulized individual treatment plan based on medical history, underlying problem and comorbidities. -PRAFO's for bilateral ankles  2. DVT Prophylaxis/Anticoagulation: Subcutaneous heparin.Check vascular study- small isolated asymptomatic left peroneal  DVT, repeat doppler in 5-7 D, discussed R medial thigh anechoic area seen on doppler will reassess at repeat , exam is neg no complaints of pain or swelling in thigh  3. Pain Management:Hydrocodone as needed. -schedule tylenol bid -kpad 4. Mood:Ativan 0.5 mg  every 4 hours as needed.Provide emotional support 5. Neuropsych: This patientiscapable of making decisions on herown behalf. 6. Skin/Wound Care:Routine skin checks 7. Fluids/Electrolytes/Nutrition:Routine I&O's with follow-up chemistries, intake low so far 960 mL's on 2/5 8.Severe COPD with tobacco abuse. Continue nebulizers. Provide counseling regarding cessation of nicotine products.NicoDerm patch as well as prednisone taper, No SOB 9.Right-sided pneumothorax. Chest tube removed 10/13/2017, no shortness of breath 10. S/p Trach: decannulated. Continue compressive dressing daily, some air leakage, as d/w pt this will heal slowly, 11.  Leukocytosis likely steroid related, afebrile 12.  Vaginal itching some improvement no rash in peri area, has received Diflucan x 1 which is usual dose for vaginal candidiasis LOS (Days) 5 A FACE TO FACE EVALUATION WAS PERFORMED  Charlett Blake 11/01/2017, 8:06 AM

## 2017-11-02 ENCOUNTER — Inpatient Hospital Stay (HOSPITAL_COMMUNITY): Payer: Medicare Other | Admitting: Speech Pathology

## 2017-11-02 ENCOUNTER — Inpatient Hospital Stay (HOSPITAL_COMMUNITY): Payer: Medicare Other | Admitting: Physical Therapy

## 2017-11-02 ENCOUNTER — Inpatient Hospital Stay (HOSPITAL_COMMUNITY): Payer: Medicare Other | Admitting: Occupational Therapy

## 2017-11-02 NOTE — Progress Notes (Signed)
Social Work Patient ID: Sally [name], female   DOB: [birth year], 67 y.o.   MRN: [id number]   CSW met with pt 11-01-17 and then with pt and her husband today to update them on team conference discussion and targeted d/c date of 11-17-17.  They are pleased pt has two more weeks to work on getting stronger.  Pt continues to be fearful of falling.  CSW offered encouragement and gave her a sign to hand in her room to remind her that her "desire to walk" is greater than her "fear of falling."  Pt was appreciative.  Pt feels she cannot get over fear overnight, but she really wants to work on it and said she will overcome it.  Pt's family is supportive.  CSW will continue to follow and assist as needed.

## 2017-11-02 NOTE — Progress Notes (Signed)
Physical Therapy Session Note  Patient Details  Name: Sally [name] MRN: [id number] Date of Birth: [birth year]  Today's Date: 11/02/2017 PT Individual Time:1515-1630   75 min   Short Term Goals: Week 1:  PT Short Term Goal 1 (Week 1): Pt will tolerate sitting up in w/c 1 hour in between therapies w/o increase in fatigue.  PT Short Term Goal 2 (Week 1): Pt will initiate standing w/ LRAD PT Short Term Goal 3 (Week 1): Pt will self-propel w/c 150' w/ supervision for endurance PT Short Term Goal 4 (Week 1): Pt will transfer via stand pivot w/ max assist using LRAD  Skilled Therapeutic Interventions/Progress Updates:   Pt received sitting in WC and agreeable to PT  NuStep endurance training 5 min +3 min level 4 on first bout, level 2 on second bout.  Cues for speed and improved ROM throughout exercise.   Sit<>stand in parallel bars x 5 with min assist from PT and moderate cues for encouragement and technique.   Gait training in Parallel bars 83f forward and backward + 6 ft with min assist from PT. [name] noted to have poor foot clearance secondary to Bil foot drop.   Sit<>stand x 3 with RW and mod progressing to min assist from PT. Max encouragement to attempt stand due to FOF and anxiety.   [facility].Pt left sitting  On BSC with call bell in reach and all needs met.  RN aware of pt position.           Therapy Documentation Precautions:  Precautions Precautions: Fall Restrictions Weight Bearing Restrictions: No    Vital Signs: Therapy Vitals Temp: 97.6 F (36.4 C) Temp Source: Oral Pulse Rate: (!) 101 Resp: 20 BP: (!) 146/67 Patient Position (if appropriate): Sitting Oxygen Therapy SpO2: 94 % O2 Device: Not Delivered Pain:   denies  See Function Navigator for Current Functional Status.   Therapy/Group: Individual Therapy  ALorie Phenix2/03/2018, 3:37 PM

## 2017-11-02 NOTE — Progress Notes (Signed)
Subjective/Complaints:  No issues overnite, denies leg pain or swelling Discussed D/C date as well as repeat doppler  Review of systems denies chest pain, shortness of breath, nausea, diarrhea, constipation Objective: Vital Signs: Blood pressure (!) 146/75, pulse 91, temperature 98.3 F (36.8 C), temperature source Oral, resp. rate 20, height 5' 2"  (1.575 m), weight 71 kg (156 lb 8.4 oz), SpO2 97 %.  Results for orders placed or performed during the hospital encounter of 10/27/17 (from the past 72 hour(s))  CBC WITH DIFFERENTIAL     Status: Abnormal   Collection Time: 10/30/17  8:24 AM  Result Value Ref Range   WBC 14.2 (H) 4.0 - 10.5 K/uL   RBC 3.65 (L) 3.87 - 5.11 MIL/uL   Hemoglobin 11.7 (L) 12.0 - 15.0 g/dL   HCT 36.1 36.0 - 46.0 %   MCV 98.9 78.0 - 100.0 fL   MCH 32.1 26.0 - 34.0 pg   MCHC 32.4 30.0 - 36.0 g/dL   RDW 18.2 (H) 11.5 - 15.5 %   Platelets 399 150 - 400 K/uL   Neutrophils Relative % 60 %   Neutro Abs 8.5 (H) 1.7 - 7.7 K/uL   Lymphocytes Relative 34 %   Lymphs Abs 4.9 (H) 0.7 - 4.0 K/uL   Monocytes Relative 5 %   Monocytes Absolute 0.7 0.1 - 1.0 K/uL   Eosinophils Relative 1 %   Eosinophils Absolute 0.1 0.0 - 0.7 K/uL   Basophils Relative 0 %   Basophils Absolute 0.0 0.0 - 0.1 K/uL    Comment: Performed at [facility], [address] [place], [place] [address]  Comprehensive metabolic panel     Status: Abnormal   Collection Time: 10/30/17  8:24 AM  Result Value Ref Range   Sodium 138 135 - 145 mmol/L   Potassium 3.5 3.5 - 5.1 mmol/L   Chloride 100 (L) 101 - 111 mmol/L   CO2 24 22 - 32 mmol/L   Glucose, Bld 126 (H) 65 - 99 mg/dL   BUN 14 6 - 20 mg/dL   Creatinine, Ser 0.56 0.44 - 1.00 mg/dL   Calcium 9.1 8.9 - 10.3 mg/dL   Total Protein 5.7 (L) 6.5 - 8.1 g/dL   Albumin 3.0 (L) 3.5 - 5.0 g/dL   AST 24 15 - 41 U/L   ALT 43 14 - 54 U/L   Alkaline Phosphatase 93 38 - 126 U/L   Total Bilirubin 0.5 0.3 - 1.2 mg/dL   GFR calc non Af Amer >60 >60  mL/min   GFR calc Af Amer >60 >60 mL/min    Comment: (NOTE) The eGFR has been calculated using the CKD EPI equation. This calculation has not been validated in all clinical situations. eGFR's persistently <60 mL/min signify possible Chronic Kidney Disease.    Anion gap 14 5 - 15    Comment: Performed at [facility] [address], [place], [place] [address]     HEENT: Lurline Idol site with foam dressing Cardio: RRR and No murmurs Resp: CTA B/L and Unlabored GI: BS positive and Nontender nondistended Extremity:  No Edema, neg Homan's in BLE, no TTP, no erythema Skin:   Intact Neuro: Alert/Oriented, Abnormal Sensory Reduced sensation in the toes and Abnormal Motor 2- bilateral hip flexor 3- knee extensors 0 at the ankle dorsiflexors 1 at the ankle plantar flexors  Musc/Skel:  Other No pain with upper extremity lower extremity range of motion General no acute distress   Assessment/Plan: 1. Functional deficits secondary to  critical illness neuropathy and myopathy which require 3+ hours per day of interdisciplinary therapy in a comprehensive inpatient rehab setting. Physiatrist is providing close team supervision and 24 hour management of active medical problems listed below. Physiatrist and rehab team continue to assess barriers to discharge/monitor patient progress toward functional and medical goals. FIM: Function - Bathing Bathing activity did not occur: Refused Position: Wheelchair/chair at sink Body parts bathed by patient: Right arm, Left arm, Chest, Abdomen Body parts bathed by helper: Back Bathing not applicable: Right lower leg, Left lower leg, Right upper leg, Left upper leg, Front perineal area, Buttocks(Declined LB) Assist Level: Touching or steadying assistance(Pt > 75%)  Function- Upper Body Dressing/Undressing Upper body dressing/undressing activity did not occur: Refused What is the patient wearing?: Pull over shirt/dress Pull over shirt/dress - Perfomed by  patient: Thread/unthread right sleeve, Thread/unthread left sleeve, Put head through opening, Pull shirt over trunk Assist Level: Set up Set up : To obtain clothing/put away Function - Lower Body Dressing/Undressing What is the patient wearing?: Pants, Non-skid slipper socks Position: Wheelchair/chair at sink Pants- Performed by patient: Thread/unthread right pants leg, Thread/unthread left pants leg Pants- Performed by helper: Pull pants up/down Non-skid slipper socks- Performed by patient: Don/doff right sock, Don/doff left sock Non-skid slipper socks- Performed by helper: Don/doff right sock, Don/doff left sock(don) Assist for footwear: Supervision/touching assist Assist for lower body dressing: 2 Helpers  Function - Toileting Toileting activity did not occur: No continent bowel/bladder event Toileting steps completed by patient: Adjust clothing prior to toileting, Adjust clothing after toileting Toileting steps completed by helper: Performs perineal hygiene Toileting Assistive Devices: Grab bar or rail Assist level: Two helpers  Function - Air cabin crew transfer assistive device: Bedside commode Assist level to toilet: Moderate assist (Pt 50 - 74%/lift or lower)  Function - Chair/bed transfer Chair/bed transfer method: Squat pivot Chair/bed transfer assist level: Moderate assist (Pt 50 - 74%/lift or lower) Chair/bed transfer assistive device: Sliding board, Armrests Chair/bed transfer details: Verbal cues for technique, Manual facilitation for placement, Manual facilitation for weight shifting, Verbal cues for precautions/safety  Function - Locomotion: Wheelchair Will patient use wheelchair at discharge?: (TBD) Type: Manual Max wheelchair distance: 181f  Assist Level: Supervision or verbal cues Assist Level: Supervision or verbal cues Wheel 150 feet activity did not occur: Safety/medical concerns Assist Level: Supervision or verbal cues Turns around,maneuvers to  table,bed, and toilet,negotiates 3% grade,maneuvers on rugs and over doorsills: No Function - Locomotion: Ambulation Ambulation activity did not occur: Safety/medical concerns Walk 10 feet activity did not occur: Safety/medical concerns Walk 50 feet with 2 turns activity did not occur: Safety/medical concerns Walk 150 feet activity did not occur: Safety/medical concerns Walk 10 feet on uneven surfaces activity did not occur: Safety/medical concerns  Function - Comprehension Comprehension: Auditory Comprehension assist level: Follows basic conversation/direction with no assist  Function - Expression Expression: Verbal Expression assist level: Expresses basic needs/ideas: With extra time/assistive device(d/t fatigue and vocal quality)  Function - Social Interaction Social Interaction assist level: Interacts appropriately 75 - 89% of the time - Needs redirection for appropriate language or to initiate interaction.  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 DVT Prophylaxis/Anticoagulation: Subcutaneous heparin.Check vascular study- small isolated asymptomatic left peroneal  DVT, repeat doppler Tomorrow,  R medial thigh anechoic area seen on doppler will reassess at repeat , exam is neg no complaints of pain or swelling in thigh  3. Pain Management:Hydrocodone as needed. -schedule tylenol  bid -kpad 4. Mood:Ativan 0.5 mg every 4 hours as needed.Provide emotional support 5. Neuropsych: This patientiscapable of making decisions on herown behalf. 6. Skin/Wound Care:Routine skin checks 7. Fluids/Electrolytes/Nutrition:Routine I&O's with follow-up chemistries, intake low so far 720 mL's on 2/6 8.Severe COPD with tobacco abuse. Continue nebulizers. Provide counseling regarding cessation of nicotine products.NicoDerm patch as well as prednisone taper, No SOB 9.Right-sided pneumothorax. Chest tube removed 10/13/2017, no shortness of breath 10. S/p Trach: decannulated. Continue compressive dressing daily, some air leakage, as d/w pt this will heal slowly, 11.  Leukocytosis likely steroid related, afebrile 12.  Vaginal itching some improvement no rash in peri area, has received Diflucan x 1 which is usual dose for vaginal candidiasis LOS (Days) 6 A FACE TO FACE EVALUATION WAS PERFORMED  Charlett Blake 11/02/2017, 7:26 AM

## 2017-11-02 NOTE — Progress Notes (Signed)
Speech Language Pathology Daily Session Note  Patient Details  Name: Sally [name] MRN: [id number] Date of Birth: 08-May-1951  Today's Date: 11/02/2017 SLP Individual Time: 1415-1500 SLP Individual Time Calculation (min): 45 min  Short Term Goals: Week 1: SLP Short Term Goal 1 (Week 1): Pt will utilize speech intelligibility strategies to achieve ~ 90 intelligibility at the sentence level in a mildly noisey environment.  SLP Short Term Goal 2 (Week 1): Pt will answer [facility] questions regarding vocal hygiene with supervision cues.  SLP Short Term Goal 3 (Week 1): Pt will complete RMST assessment when medically cleared.   Skilled Therapeutic Interventions: Skilled treatment session focused on communication goals. IMST and EMST trainers introduced and pt able to independently demonstrate use. IMST set at 31 cmH2O for set of 8 with self-pereeived level of 8 out of 10 and EMST 150 set at 30 cmH2O for a set of 8 with self-perceived level of 8 out of 10. Pt with difficulty at set of second set d/t endurance and fatigue. Pt able to achieve > 95% accuracy at the sentence level in moderately distracting environment with Mod I. Pt was returned to room, left upright in wheelchair with all needs within reach. Continue per current plan of care.      Function:    Cognition Comprehension Comprehension assist level: Understands complex 90% of the time/cues 10% of the time  Expression   Expression assist level: Expresses basic needs/ideas: With extra time/assistive device  Social Interaction Social Interaction assist level: Interacts appropriately 75 - 89% of the time - Needs redirection for appropriate language or to initiate interaction.  Problem Solving Problem solving assist level: Solves complex 90% of the time/cues < 10% of the time  Memory Memory assist level: More than reasonable amount of time    Pain    Therapy/Group: Individual Therapy  Yoana Staib 11/02/2017, 3:27 PM

## 2017-11-02 NOTE — Patient Care Conference (Signed)
Inpatient RehabilitationTeam Conference and Plan of Care Update Date: 11/01/2017   Time:  10:50 AM   Patient Name: Sally [name]      Medical Record Number: [id number]  Date of Birth: 21-Dec-1950 Sex: Female         Room/Bed: 4W09C/4W09C-01 Payor Info: Payor: [employer] MEDICARE / Plan: [employer] MEDICARE / Product Type: *No Product type* /    Admitting Diagnosis: cril Illness neropathy  Admit Date/Time:  10/27/2017  8:07 PM Admission Comments: No comment available   Primary Diagnosis:  <principal problem not specified> Principal Problem: <principal problem not specified>  Patient Active Problem List   Diagnosis Date Noted  . Critical illness myopathy   . Critical illness neuropathy (HCC)   . Leukocytosis   . Tracheostomy status (HCC)   . Hypoxemia   . Pneumothorax, traumatic   . Acute respiratory failure (HCC)   . Influenza A   . Acute on chronic respiratory failure (HCC)   . Acute respiratory failure with hypoxemia (HCC) 09/26/2017  . RSV (acute bronchiolitis due to respiratory syncytial virus) 10/06/2016  . COPD exacerbation (HCC) 07/14/2016  . Acute respiratory failure with hypoxia and hypercapnia (HCC)   . Essential hypertension   . Dyslipidemia     Expected Discharge Date: Expected Discharge Date: 11/17/17  Team Members Present: Physician leading conference: Dr. Claudette Laws Social Worker Present: Staci Acosta, LCSW Nurse Present: Tennis Must, RN PT Present: Grier Rocher, PT OT Present: Johnsie Cancel, OT SLP Present: Feliberto Gottron, SLP PPS Coordinator present : Tora Duck, RN, CRRN     Current Status/Progress Goal Weekly Team Focus  Medical   small calf DVT asymptomatic, trach healing slowly  avoid extension of DVT  Recheck Doppler   Bowel/Bladder   Continent of bowel & bladder, LBM 10/31/17  remain continent  continue to monitor for changes & assist as needed   Swallow/Nutrition/ Hydration             ADL's   Supervision UB bathing/dressing EOB; MinGuard  for LB bathing EOB with lateral leans; ModA functional transfers; requires encouragement   MinA-supervision  ADL retraining; functional transfers; activity tolerance; strengthening   Mobility   mod assist transfers. min-supervision WC mobility. unable to ambulate at this time  due to anxiety   supervision assist transfers and gait for short distances. supervision assist WC mobility   improved independence for upright tolerance, gait and trasnfers. WC mobility and discharge planning.    Communication   Supervision d/t vocal quality  Mod I  finger placement over stoma, RMST evaluation   Safety/Cognition/ Behavioral Observations            Pain   c/o pain to her lower back &BLE, she has Norco prn and tylenol scheduled TID, new order for robaxin but not taking it  pain scale <4  assess & treat as needed   Skin   various bruises to abdomen from heparin, redness & yeastish rash to groin & perineum, healing  no new areas of skin break down  assess q shift    Rehab Goals Patient on target to meet rehab goals: Yes Rehab Goals Revised: none *See Care Plan and progress notes for long and short-term goals.     Barriers to Discharge  Current Status/Progress Possible Resolutions Date Resolved   Physician    Medical stability     progressing with therapy  cont rehab, recheck doppler      Nursing  PT                    OT                  SLP                SW                Discharge Planning/Teaching Needs:  Pt plans to return to her home where her husband and family will assist her.  Pt's family will come for family education closer to d/c.   Team Discussion:  Pt with a small DVT in left calf and Dr. Wynn Banker plans to have it rechecked on Friday to make sure it hasn't gotten bigger - would then need anticoagulant.  Pt's trach site is healing slowly, still with a little leak.  Pt with feel of failing and general anxiety (pre-existing) and needs encouragement, but she can  do a min A slide board tx.  It takes 2 people to stand, then pt stood for 30 seconds.  OOB tolerance will be focus over the next week.  Pt is mod A for standing with stedy, limited by endurance and anxiety.  No gait yet due to anxiety, fear, endurance and fatigue.  Pt is S for bed mobility.  ST picked pt up to do RMST when appropriate, but stoma is healing slowly.  Revisions to Treatment Plan:  none    Continued Need for Acute Rehabilitation Level of Care: The patient requires daily medical management by a physician with specialized training in physical medicine and rehabilitation for the following conditions: Daily direction of a multidisciplinary physical rehabilitation program to ensure safe treatment while eliciting the highest outcome that is of practical value to the patient.: Yes Daily medical management of patient stability for increased activity during participation in an intensive rehabilitation regime.: Yes Daily analysis of laboratory values and/or radiology reports with any subsequent need for medication adjustment of medical intervention for : Neurological problems;Pulmonary problems  Brytni Dray, [place] Deck 11/02/2017, 3:40 PM

## 2017-11-02 NOTE — Progress Notes (Signed)
Occupational Therapy Session Note  Patient Details  Name: Sally [name] MRN: [id number] Date of Birth: August 12, 1951  Today's Date: 11/02/2017 OT Individual Time: 1045-1200 OT Individual Time Calculation (min): 75 min    Short Term Goals: Week 1:  OT Short Term Goal 1 (Week 1): Pt will transfer to toilet with mod A with use of slide board.  OT Short Term Goal 2 (Week 1): Pt will perform UB dressing with mod A. OT Short Term Goal 3 (Week 1): Pt will engage in 10 minutes of functional task with 2 rest breaks or less secondary to fatigue.  Skilled Therapeutic Interventions/Progress Updates:    Pt seen for OT ADL bathing/dressing session. Pt in supien upon arrival, denied pain but voicing increased fatigue. With encouragement, agreeable to tx session, voiced need for otileting task. SHe transferred to EOB with supervision using hospital bed functions. Completed mod A squat pivot transfer to drop arm BSC with multimodal cuing for hand placement and techique. Lateral leans for hygiene following toileting task. She transitioned to w/c and completed bathing/dressing from w/c level. She declined shower this morning due to anxiety, following education and encouragement, pt willing to attempt shower tomorrow.  She completed bathing/dressin from w/c level at sink. Set-up assist for UB bathing/clothing. She stooda t sink with mod-max A for buttock hygiene, pt unable to let go of sink in order to complete hygiene/ clothing management. Pt voiced that biggest fear of standing is falling forward and is more comfortable standing if something is placed in front of her. Pt taken to therapy gym total A in w/c. Completed sit>stand at side of // bars. Pt able to stand with mod A to side of bars. Longest standing trial 4 minutes and 30 seconds. During second trial, pt able to alternate UE support on rail, however, unwilling to attempt stand without UE support.  Pt taken back to room at end of session, squat pivot to recliner  with encouragement and education to build OOB tolerance and benefits of upright positioning.  Pt left set-up with lunch tray and all needs in reach.   Therapy Documentation Precautions:  Precautions Precautions: Fall Restrictions Weight Bearing Restrictions: No Pain:   Voiced neuropathic pain in B feet, reports being pre-medicated prior to tx session.   See Function Navigator for Current Functional Status.   Therapy/Group: Individual Therapy  Zilpha Mcandrew L 11/02/2017, 7:13 AM

## 2017-11-03 ENCOUNTER — Inpatient Hospital Stay (HOSPITAL_COMMUNITY): Payer: Medicare Other | Admitting: Physical Therapy

## 2017-11-03 ENCOUNTER — Inpatient Hospital Stay (HOSPITAL_COMMUNITY)
Admission: RE | Admit: 2017-11-03 | Discharge: 2017-11-03 | Disposition: A | Discharge status: Home / Self Care | Payer: Medicare Other | Source: Intra-hospital | Attending: Physical Medicine and Rehabilitation | Admitting: Physical Medicine and Rehabilitation

## 2017-11-03 ENCOUNTER — Inpatient Hospital Stay (HOSPITAL_COMMUNITY): Payer: Medicare Other | Admitting: Occupational Therapy

## 2017-11-03 ENCOUNTER — Inpatient Hospital Stay (HOSPITAL_COMMUNITY): Payer: Medicare Other | Admitting: Speech Pathology

## 2017-11-03 DIAGNOSIS — I82409 Acute embolism and thrombosis of unspecified deep veins of unspecified lower extremity: Secondary | ICD-10-CM

## 2017-11-03 MED ORDER — GUAIFENESIN 100 MG/5ML PO SOLN
15.0000 mL | Freq: Four times a day (QID) | ORAL | Status: DC
  Administered 2017-11-03 – 2017-11-17 (×54): 300 mg via ORAL
  Filled 2017-11-03 (×27): qty 15
  Filled 2017-11-03: qty 25
  Filled 2017-11-03: qty 10
  Filled 2017-11-03 (×6): qty 15
  Filled 2017-11-03: qty 5
  Filled 2017-11-03 (×7): qty 15
  Filled 2017-11-03: qty 25
  Filled 2017-11-03: qty 5
  Filled 2017-11-03 (×11): qty 15

## 2017-11-03 MED ORDER — GUAIFENESIN ER 600 MG PO TB12
600.0000 mg | ORAL_TABLET | Freq: Two times a day (BID) | ORAL | Status: DC
  Administered 2017-11-03: 600 mg via ORAL
  Filled 2017-11-03: qty 1

## 2017-11-03 NOTE — Progress Notes (Signed)
Occupational Therapy Weekly Progress Note  Patient Details  Name: Sally [name] MRN: [id number] Date of Birth: 06-12-1951  Beginning of progress report period: October 27, 2017 End of progress report period: November 03, 2017  Today's Date: 11/03/2017 OT Individual Time: 0945-1100 OT Individual Time Calculation (min): 75 min    Patient has met 3 of 3 short term goals.  Pt is making steady progress towards OT goals. She cont to be most limited by decreased activity tolerance, high levels of anxiety, especially in regards to fear of falling. She requires significantly increased time, encouragement, and VCs for functional transfers and in the progression of standing. She can currently stand at sink or side of parallel bars with mod A, usually progressing to min A with subsequent trials. Pt remains very motivated and desiring to return to ambulatory level.   Patient continues to demonstrate the following deficits: abnormal posture, muscle weakness (generalized) and impaired sensation and therefore will continue to benefit from skilled OT intervention to enhance overall performance with BADL and Reduce care partner burden.  Patient progressing toward long term goals..  Continue plan of care.  OT Short Term Goals Week 1:  OT Short Term Goal 1 (Week 1): Pt will transfer to toilet with mod A with use of slide board.  OT Short Term Goal 1 - Progress (Week 1): Met OT Short Term Goal 2 (Week 1): Pt will perform UB dressing with mod A. OT Short Term Goal 2 - Progress (Week 1): Met OT Short Term Goal 3 (Week 1): Pt will engage in 10 minutes of functional task with 2 rest breaks or less secondary to fatigue. OT Short Term Goal 3 - Progress (Week 1): Met Week 2:  OT Short Term Goal 1 (Week 2): Pt will complete stand pivot transfer with use of RW and min A OT Short Term Goal 2 (Week 2): Pt will stand at RW to complete clothing management during toileting or LB dressing with steadying assist OT Short Term  Goal 3 (Week 2): Pt will complete 3/3 toileting tasks with steadying assist OT Short Term Goal 4 (Week 2): Pt will stand at sink to complete 1 grooming task in order to increase functional activity tolerance  Skilled Therapeutic Interventions/Progress Updates:    Pt seen for OT ADL bathing/dressing session. Pt in supine upon arrival, high anxiety when discussing showering this session but with encouragement and education pt willing to attempt. She transferred to EOB with supervision using hospital bed functions. Used STEDY throughout session for functional transfers in order to assist with reducing pt's anxiety with showering task. She stood initially with mod A into STEDY progressing throughout session to min A.  She bathed seated on padded tub bench, lateral leans for buttock hygiene.  She transitioned out of shower, had continent BM, and able to stand in STEDY to complete hygiene. She returned to w/c to dress, requiring significantly increased time and rest breaks throughout. She stood at isnk with mod A and able to pull pants up while maintaining one UE support on sink ledge and steadying assist. Following seated rest break, pt complete oral hygiene in standing with steadying assist following set-up. She transitioned to recliner at end of session, left seated with all needs in reach.  Pt requires significantly increased time and rest breaks throughout session as well as cuing for deep breathing techniques during standing, however, is demonstrating improved functional capabilities.   Therapy Documentation Precautions:  Precautions Precautions: Fall Restrictions Weight Bearing Restrictions: No Pain:  No/ denies pain  See Function Navigator for Current Functional Status.   Therapy/Group: Individual Therapy  Yanis Larin L 11/03/2017, 7:06 AM

## 2017-11-03 NOTE — Progress Notes (Signed)
Speech Language Pathology Weekly Progress and Session Note  Patient Details  Name: Sally [name] MRN: [id number] Date of Birth: 1951-03-05  Beginning of progress report period: October 28, 2016 End of progress report period: November 03, 2017  Today's Date: 11/03/2017 SLP Individual Time: 8786-7672 SLP Individual Time Calculation (min): 30 min  Short Term Goals: Week 1: SLP Short Term Goal 1 (Week 1): Pt will utilize speech intelligibility strategies to achieve ~ 90 intelligibility at the sentence level in a mildly noisey environment.  SLP Short Term Goal 1 - Progress (Week 1): Met SLP Short Term Goal 2 (Week 1): Pt will answer [facility] questions regarding vocal hygiene with supervision cues.  SLP Short Term Goal 2 - Progress (Week 1): Met SLP Short Term Goal 3 (Week 1): Pt will complete RMST assessment when medically cleared.  SLP Short Term Goal 3 - Progress (Week 1): Met SLP Short Term Goal 4 (Week 1): Pt will complete 3 sets of 10 of EMST set at   35 cmH2O   with self-perceived level of effort of 8 out of 10.  SLP Short Term Goal 4 - Progress (Week 1): Met SLP Short Term Goal 5 (Week 1): Pt will complete 3 sets of 10 of IMST set at 31 with self-perceived level of effort of 8 out of 10.  SLP Short Term Goal 5 - Progress (Week 1): Met    New Short Term Goals: Week 2: SLP Short Term Goal 1 (Week 2): Pt will utilize speech intelligibility strategies with Mod I to achieve ~ 90 intelligibility at the simple conversation in a mildly noisey environment.  SLP Short Term Goal 2 (Week 2): Pt will complete 3 sets of 10 of EMST set at  45 cmH2O with self-perceived level of effort of 8 out of 10.  SLP Short Term Goal 3 (Week 2): Pt will complete 3 sets of 10 of IMST set at 45  cmH2Owith self-perceived level of effort of 8 out of 10.   Weekly Progress Updates: Pt with great progress over this reporting period and as a result has met 5 of 5 STGs. Pt with increased vocal quality and increased respiratory  strength. Pt requires supervision cues to facilitate increased speech intelligibility and therefore further short length of skilled ST is required to advance pt to Mod I level.      Intensity: Minumum of 1-2 x/day, 30 to 90 minutes Frequency: 3 to 5 out of 7 days Duration/Length of Stay: 1 week for ST Treatment/Interventions: Speech/Language facilitation;Patient/family education   Daily Session  Skilled Therapeutic Interventions: Skilled treatment session focused on communication goals. SLP faciltiated session by providing Mod I cues to achieve ~ 95% intelligibility at the simple conversation level. Pt with voicing through verbalizations as well as increased numbers of words spoken with each breath. Pt able to return demonstrate EMST/IMST for 5 reps with self-perceived exertion of 8 out of 10 d/t fatigue from coughing episodes earlier in day. Pt with great progress towards improved vocal quality. Pt left upright in bed with all needs within reach.       Function:     Cognition Comprehension Comprehension assist level: Understands complex 90% of the time/cues 10% of the time  Expression   Expression assist level: Expresses basic needs/ideas: With extra time/assistive device  Social Interaction Social Interaction assist level: Interacts appropriately 90% of the time - Needs monitoring or encouragement for participation or interaction.  Problem Solving Problem solving assist level: Solves complex problems: With extra time  Memory  Memory assist level: More than reasonable amount of time;Complete Independence: No helper   General    Pain Pain Assessment Pain Assessment: 0-10 Pain Score: 2  Pain Type: Acute pain Pain Location: Head Pain Orientation: Left Pain Descriptors / Indicators: Aching Pain Onset: Sudden Patients Stated Pain Goal: 2 Pain Intervention(s): Medication (See eMAR);Cold applied  Therapy/Group: Individual Therapy  Temesgen Weightman 11/03/2017, 5:14 PM

## 2017-11-03 NOTE — Progress Notes (Signed)
*  PRELIMINARY RESULTS* Vascular Ultrasound Left lower extremity venous duplex has been completed.  Preliminary findings: Acute deep vein thrombosis noted in one of the left peroneal veins, unchanged from prior study 10/30/17.  Other visualized veins of the left lower extremity are negative for DVT.  Sally [name] Sally [name] 11/03/2017, 1:32 PM

## 2017-11-03 NOTE — Plan of Care (Signed)
  Progressing Consults RH GENERAL PATIENT EDUCATION Description See Patient Education module for education specifics. 11/03/2017 0518 - Progressing by Marigene Ehlers, RN RH BLADDER ELIMINATION RH STG MANAGE BLADDER WITH ASSISTANCE Description STG Manage Bladder With min Assistance  11/03/2017 0518 - Progressing by Marigene Ehlers, RN RH SAFETY RH STG ADHERE TO SAFETY PRECAUTIONS W/ASSISTANCE/DEVICE Description STG Adhere to Safety Precautions With min Assistance/Device.  11/03/2017 0518 - Progressing by Marigene Ehlers, RN RH PAIN MANAGEMENT RH STG PAIN MANAGED AT OR BELOW PT'S PAIN GOAL Description Less than 3 out of 10  11/03/2017 0518 - Progressing by Marigene Ehlers, RN

## 2017-11-03 NOTE — Progress Notes (Signed)
Subjective/Complaints: No LE pain, moving Left foot a little  Review of systems denies chest pain, shortness of breath, nausea, diarrhea, constipation Objective: Vital Signs: Blood pressure (!) 137/58, pulse 87, temperature 97.7 F (36.5 C), temperature source Oral, resp. rate 18, height 5\' 2"  (1.575 m), weight 71.3 kg (157 lb 3 oz), SpO2 98 %.  No results found for this or any previous visit (from the past 72 hour(s)).   HEENT: Trach site 2-8mm stoma, fibrinous granulation tissue Cardio: RRR and No murmurs Resp: CTA B/L and Unlabored GI: BS positive and Nontender nondistended Extremity:  No Edema, neg Homan's in BLE, no TTP, no erythema Skin:   Intact Neuro: Alert/Oriented, Abnormal Sensory Reduced sensation in the toes and Abnormal Motor 2- bilateral hip flexor 3- knee extensors 0 at the ankle dorsiflexors 1 at the ankle plantar flexors  Musc/Skel:  Other No pain with upper extremity lower extremity range of motion General no acute distress   Assessment/Plan: 1. 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 Physiatrist is providing close team supervision and 24 hour management of active medical problems listed below. Physiatrist and rehab team continue to assess barriers to discharge/monitor patient progress toward functional and medical goals. FIM: Function - Bathing Bathing activity did not occur: Refused Position: Wheelchair/chair at sink Body parts bathed by patient: Right arm, Left arm, Chest, Abdomen, Right upper leg, Left upper leg, Right lower leg, Left lower leg, Back Body parts bathed by helper: Back, Front perineal area, Buttocks Bathing not applicable: Right lower leg, Left lower leg, Right upper leg, Left upper leg, Front perineal area, Buttocks(Declined LB) Assist Level: Touching or steadying assistance(Pt > 75%)  Function- Upper  Body Dressing/Undressing Upper body dressing/undressing activity did not occur: Refused What is the patient wearing?: Pull over shirt/dress Pull over shirt/dress - Perfomed by patient: Thread/unthread right sleeve, Thread/unthread left sleeve, Put head through opening, Pull shirt over trunk Assist Level: Set up Set up : To obtain clothing/put away Function - Lower Body Dressing/Undressing What is the patient wearing?: Pants, Non-skid slipper socks Position: Wheelchair/chair at sink Pants- Performed by patient: Thread/unthread right pants leg, Thread/unthread left pants leg Pants- Performed by helper: Pull pants up/down Non-skid slipper socks- Performed by patient: Don/doff right sock, Don/doff left sock Non-skid slipper socks- Performed by helper: Don/doff right sock, Don/doff left sock(don) Assist for footwear: Supervision/touching assist Assist for lower body dressing: Touching or steadying assistance (Pt > 75%)  Function - Toileting Toileting activity did not occur: No continent bowel/bladder event Toileting steps completed by patient: Adjust clothing prior to toileting, Adjust clothing after toileting Toileting steps completed by helper: Performs perineal hygiene Toileting Assistive Devices: Grab bar or rail Assist level: Two helpers  Function - Archivist transfer assistive device: Drop arm commode Assist level to toilet: Moderate assist (Pt 50 - 74%/lift or lower) Assist level from toilet: Moderate assist (Pt 50 - 74%/lift or lower)  Function - Chair/bed transfer Chair/bed transfer method: Squat pivot Chair/bed transfer assist level: Moderate assist (Pt 50 - 74%/lift or lower) Chair/bed transfer assistive device: Sliding board, Armrests Chair/bed transfer details: Verbal cues for technique, Manual facilitation for placement, Manual facilitation for weight shifting, Verbal cues for precautions/safety  Function - Locomotion: Wheelchair Will patient use wheelchair  at discharge?: (TBD) Type: Manual Max wheelchair distance: 18ft  Assist Level: Supervision or verbal cues Assist Level: Supervision or verbal cues Wheel 150 feet activity did not  occur: Safety/medical concerns Assist Level: Supervision or verbal cues Turns around,maneuvers to table,bed, and toilet,negotiates 3% grade,maneuvers on rugs and over doorsills: No Function - Locomotion: Ambulation Ambulation activity did not occur: Safety/medical concerns Walk 10 feet activity did not occur: Safety/medical concerns Walk 50 feet with 2 turns activity did not occur: Safety/medical concerns Walk 150 feet activity did not occur: Safety/medical concerns Walk 10 feet on uneven surfaces activity did not occur: Safety/medical concerns  Function - Comprehension Comprehension: Auditory Comprehension assist level: Understands complex 90% of the time/cues 10% of the time  Function - Expression Expression: Verbal Expression assist level: Expresses basic needs/ideas: With extra time/assistive device  Function - Social Interaction Social Interaction assist level: Interacts appropriately 75 - 89% of the time - Needs redirection for appropriate language or to initiate interaction.  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 DVT Prophylaxis/Anticoagulation: Subcutaneous heparin.Check vascular study- small  isolated asymptomatic left peroneal  DVT, repeat doppler 2/8 if this enlarges or if additional areas of clot are identified would rec anticoag, if doppler stable to improved would repeat next week ,  R medial thigh anechoic area seen on doppler will reassess at repeat , exam is neg no complaints of pain or swelling in thigh  3. Pain Management:Hydrocodone as needed. -schedule tylenol bid -kpad 4. Mood:Ativan 0.5 mg every 4 hours as needed.Provide emotional support 5. Neuropsych: This patientiscapable of making decisions on herown behalf. 6. Skin/Wound Care:Routine skin checks 7. Fluids/Electrolytes/Nutrition:Routine I&O's with follow-up chemistries, intake low so far 720 mL's on 2/7 8.Severe COPD with tobacco abuse. Continue nebulizers. Provide counseling regarding cessation of nicotine products.NicoDerm patch as well as prednisone taper, No SOB 9.Right-sided pneumothorax. Chest tube removed 10/13/2017, no shortness of breath 10. S/p Trach: decannulated. Continue compressive dressing daily, some air leakage, as d/w pt this will heal slowly due to steroids, 11.  Leukocytosis likely steroid related, afebrile 12.  Vaginal itching some improvement no rash in peri area, has received Diflucan x 1 which is usual dose for vaginal candidiasis LOS (Days) 7 A FACE TO FACE EVALUATION WAS PERFORMED  Erick Colace 11/03/2017, 6:55 AM

## 2017-11-04 ENCOUNTER — Other Ambulatory Visit: Payer: Self-pay

## 2017-11-04 ENCOUNTER — Inpatient Hospital Stay (HOSPITAL_COMMUNITY): Payer: Medicare Other | Admitting: Occupational Therapy

## 2017-11-04 ENCOUNTER — Inpatient Hospital Stay (HOSPITAL_COMMUNITY): Payer: Medicare Other | Admitting: Physical Therapy

## 2017-11-04 DIAGNOSIS — D62 Acute posthemorrhagic anemia: Secondary | ICD-10-CM

## 2017-11-04 DIAGNOSIS — I82492 Acute embolism and thrombosis of other specified deep vein of left lower extremity: Secondary | ICD-10-CM

## 2017-11-04 DIAGNOSIS — I82409 Acute embolism and thrombosis of unspecified deep veins of unspecified lower extremity: Secondary | ICD-10-CM

## 2017-11-04 DIAGNOSIS — J449 Chronic obstructive pulmonary disease, unspecified: Secondary | ICD-10-CM

## 2017-11-04 MED ORDER — PRO-STAT SUGAR FREE PO LIQD
30.0000 mL | Freq: Two times a day (BID) | ORAL | Status: DC
  Administered 2017-11-04 – 2017-11-16 (×23): 30 mL via ORAL
  Filled 2017-11-04 (×25): qty 30

## 2017-11-04 NOTE — Progress Notes (Signed)
Subjective/Complaints: Patient seen sitting up in bed this morning. She states she slept well overnight.  Review of systems denies chest pain, shortness of breath, nausea, diarrhea Objective: Vital Signs: Blood pressure (!) 150/67, pulse 89, temperature 97.8 F (36.6 C), temperature source Oral, resp. rate 16, height 5\' 2"  (1.575 m), weight 70.6 kg (155 lb 10.3 oz), SpO2 98 %.  No results found for this or any previous visit (from the past 72 hour(s)).   HEENT: Trach site with dressing C/D/I Cardio: RRR and No JVD Resp: CTA B/L and Unlabored GI: BS positive and nondistended Musculoskeletal:  No Edema, no tenderness in extremities Skin:   Intact. Warm and dry. Neuro: Alert/Oriented,  Motor: Bilateral upper extremities 4/5 proximal to distal Bilateral lower extremity is: 4-/5 hip flexion, knee extension, 2 -/5 ankle dorsi/plantar flexion General no acute distress. Vital signs reviewed.   Assessment/Plan: 1. 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 Physiatrist is providing close team supervision and 24 hour management of active medical problems listed below. Physiatrist and rehab team continue to assess barriers to discharge/monitor patient progress toward functional and medical goals. FIM: Function - Bathing Bathing activity did not occur: Refused Position: Shower Body parts bathed by patient: Right arm, Left arm, Chest, Abdomen, Right upper leg, Left upper leg, Right lower leg, Left lower leg, Front perineal area, Buttocks Body parts bathed by helper: Back Bathing not applicable: Right lower leg, Left lower leg, Right upper leg, Left upper leg, Front perineal area, Buttocks(Declined LB) Assist Level: Touching or steadying assistance(Pt > 75%)  Function- Upper Body Dressing/Undressing Upper body dressing/undressing activity did not occur:  Refused What is the patient wearing?: Pull over shirt/dress Pull over shirt/dress - Perfomed by patient: Thread/unthread right sleeve, Thread/unthread left sleeve, Put head through opening, Pull shirt over trunk Assist Level: Set up Set up : To obtain clothing/put away Function - Lower Body Dressing/Undressing What is the patient wearing?: Pants, Non-skid slipper socks Position: Wheelchair/chair at sink Pants- Performed by patient: Thread/unthread right pants leg, Thread/unthread left pants leg, Pull pants up/down Pants- Performed by helper: Pull pants up/down Non-skid slipper socks- Performed by patient: Don/doff right sock, Don/doff left sock Non-skid slipper socks- Performed by helper: Don/doff right sock, Don/doff left sock(don) Assist for footwear: Supervision/touching assist Assist for lower body dressing: Touching or steadying assistance (Pt > 75%)  Function - Toileting Toileting activity did not occur: No continent bowel/bladder event Toileting steps completed by patient: Adjust clothing prior to toileting, Adjust clothing after toileting Toileting steps completed by helper: Performs perineal hygiene Toileting Assistive Devices: Grab bar or rail Assist level: Two helpers  Function - Archivist transfer assistive device: Drop arm commode Assist level to toilet: Moderate assist (Pt 50 - 74%/lift or lower) Assist level from toilet: Moderate assist (Pt 50 - 74%/lift or lower)  Function - Chair/bed transfer Chair/bed transfer method: Squat pivot Chair/bed transfer assist level: Moderate assist (Pt 50 - 74%/lift or lower) Chair/bed transfer assistive device: Sliding board, Armrests Chair/bed transfer details: Verbal cues for technique, Manual facilitation for placement, Manual facilitation for weight shifting, Verbal cues for precautions/safety  Function - Locomotion: Wheelchair Will patient use wheelchair at discharge?: (TBD) Type: Manual Max wheelchair distance:  171ft  Assist Level: Supervision or verbal cues Assist Level: Supervision or verbal cues Wheel 150 feet activity did not occur: Safety/medical concerns Assist Level: Supervision or verbal cues Turns around,maneuvers to table,bed, and toilet,negotiates  3% grade,maneuvers on rugs and over doorsills: No Function - Locomotion: Ambulation Ambulation activity did not occur: Safety/medical concerns Walk 10 feet activity did not occur: Safety/medical concerns Walk 50 feet with 2 turns activity did not occur: Safety/medical concerns Walk 150 feet activity did not occur: Safety/medical concerns Walk 10 feet on uneven surfaces activity did not occur: Safety/medical concerns  Function - Comprehension Comprehension: Auditory Comprehension assist level: Understands complex 90% of the time/cues 10% of the time  Function - Expression Expression: Verbal Expression assist level: Expresses basic needs/ideas: With extra time/assistive device  Function - Social Interaction Social Interaction assist level: Interacts appropriately 90% of the time - Needs monitoring or encouragement for participation or interaction.  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 DVT Prophylaxis/Anticoagulation: Subcutaneous heparin.   Small isolated asymptomatic left peroneal  DVT, stable on repeat study on 2/8, plan for follow-up next week 3. Pain  Management:Hydrocodone as needed. -schedule tylenol bid -kpad 4. Mood:Ativan 0.5 mg every 4 hours as needed.Provide emotional support 5. Neuropsych: This patientiscapable of making decisions on herown behalf. 6. Skin/Wound Care:Routine skin checks 7. Fluids/Electrolytes/Nutrition:Routine I&O's with follow-up chemistries 8.Severe COPD with tobacco abuse. Continue nebulizers. Provide counseling regarding cessation of nicotine products.NicoDerm patch as well as prednisone taper, No SOB 9.Right-sided pneumothorax. Chest tube removed 10/13/2017, no shortness of breath 10. S/p Trach: decannulated. Continue compressive dressing daily, some air leakage, as d/w pt this will heal slowly due to steroids, 11.  Leukocytosis likely steroid related, afebrile   WBCs 14.2 on 2/4    Labs ordered for Monday   Continue to monitor  12.  Vaginal itching some improvement no rash in peri area, has received Diflucan x 1 which is usual dose for vaginal candidiasis  13. Acute blood loss anemia   Hemoglobin 11.7 on 2/4   Continue to monitor 14. Hypoalbuminemia   Supplement initiated on 2/9   LOS (Days) 8 A FACE TO FACE EVALUATION WAS PERFORMED  Ankit Karis Juba 11/04/2017, 8:19 AM

## 2017-11-04 NOTE — Progress Notes (Signed)
Physical Therapy Session Note  Patient Details  Name: Sally [name] MRN: [id number] Date of Birth: 1950/11/01  Today's Date: 11/03/2017 PT Individual Time:1400-1525 85 min   Short Term Goals: Week 1:  PT Short Term Goal 1 (Week 1): Pt will tolerate sitting up in w/c 1 hour in between therapies w/o increase in fatigue.  PT Short Term Goal 2 (Week 1): Pt will initiate standing w/ LRAD PT Short Term Goal 3 (Week 1): Pt will self-propel w/c 150' w/ supervision for endurance PT Short Term Goal 4 (Week 1): Pt will transfer via stand pivot w/ max assist using LRAD  Skilled Therapeutic Interventions/Progress Updates:   Pt received supine in bed and agreeable to PT. Pt reports that her back pain, and sputum discharge with coughing is preventing her from any out of bed activity this PM. Pt agreeable to bed level therex.  SAQ 2 x 12  Heel slides, 2 x 10   Hip abduction 2 x 10  Clam shells with level 2 tband 2 x 12  Reciprocal marches with level 2 tband 2x 5  Ankle DF with AAROM 2 x 20 Chest press with 3lb bar weight 2 x 10  Isometric hip adduction 2 x 12  Quad sets 2 x 12  Pt required prolonged rest breaks following each exercise. Pt required min-mod cues for technique and proper speed and ROM too improve strengthening aspect of movements.   Rolling in bed and scooting to [facility] with PT to stabilize BLE using bridge technique.   Pt left supine in bed with call bell in reach and all needs met.      Therapy Documentation Precautions:  Precautions Precautions: Fall Restrictions Weight Bearing Restrictions: No    Vital Signs: Therapy Vitals Temp: 97.8 F (36.6 C) Temp Source: Oral Pulse Rate: 89 Resp: 16 BP: (!) 150/67 Patient Position (if appropriate): Lying Oxygen Therapy SpO2: 98 % O2 Device: Not Delivered Pain: 9/10 low back and head. RN aware and medication administered  See Function Navigator for Current Functional Status.   Therapy/Group: Individual Therapy  [name] 11/04/2017, 6:21 AM

## 2017-11-04 NOTE — Progress Notes (Signed)
Physical Therapy Weekly Progress Note  Patient Details  Name: Sally [name] MRN: [id number] Date of Birth: 05/23/1951  Beginning of progress report period: October 28, 2017 End of progress report period: November 04, 2017  Today's Date: 11/04/2017 PT Individual Time: 1630-1700 PT Individual Time Calculation (min): 30 min   Patient has met 4 of 4 short term goals. Pt making faster than anticipated progress towards goals. Pt currently requires supervision assist for bed mobility, min assist for squat pivot or stand pivot transfers, as well as supervision assist for WC mobility. Pt's greatest limiting factors are anxiety, and distal BLE weakness.  Patient continues to demonstrate the following deficits muscle weakness and muscle paralysis, decreased cardiorespiratoy endurance and decreased oxygen support, abnormal tone, decreased awareness and decreased standing balance and decreased balance strategies and therefore will continue to benefit from skilled PT intervention to increase functional independence with mobility.  Patient progressing toward long term goals..  Continue plan of care.  PT Short Term Goals Week 1:  PT Short Term Goal 1 (Week 1): Pt will tolerate sitting up in w/c 1 hour in between therapies w/o increase in fatigue.  PT Short Term Goal 1 - Progress (Week 1): Met PT Short Term Goal 2 (Week 1): Pt will initiate standing w/ LRAD PT Short Term Goal 2 - Progress (Week 1): Met PT Short Term Goal 3 (Week 1): Pt will self-propel w/c 150' w/ supervision for endurance PT Short Term Goal 3 - Progress (Week 1): Met PT Short Term Goal 4 (Week 1): Pt will transfer via stand pivot w/ max assist using LRAD PT Short Term Goal 4 - Progress (Week 1): Met Week 2:  PT Short Term Goal 1 (Week 2): STG =LTG due to ELOS  Skilled Therapeutic Interventions/Progress Updates:     Pt received sitting in recliner and agreeable to PT  Pt transferred to [facility] with stand pivot with BUE support on RW and min  assist from PT to stabilize RW.   Pt transported to rehab gym in [facility]. Sit<>stand from [facility] with supervision assist and PT to stabilize the RW and provide min cues for proper use of arm rests to push to standing position.   Gait training with RW x 49f with min assist from PT. Min-mod cues for posture and step length.  [facility] as listed [facility]. [facility] and all needs met.     Therapy Documentation Precautions:  Precautions Precautions: Fall Restrictions Weight Bearing Restrictions: No Vital Signs: Oxygen Therapy O2 Device: Not Delivered Pain: Pain Assessment Pain Assessment: No/denies pain Pain Score: 0-No pain  See Function Navigator for Current Functional Status.  Therapy/Group: Individual Therapy  ALorie Phenix2/05/2018, 11:27 PM

## 2017-11-04 NOTE — Progress Notes (Signed)
Occupational Therapy Session Note  Patient Details  Name: Sally [name] MRN: [id number] Date of Birth: 1950-12-11  Today's Date: 11/04/2017 OT Individual Time: 1417-1500 OT Individual Time Calculation (min): 43 min   Short Term Goals: Week 1:  OT Short Term Goal 1 (Week 1): Pt will transfer to toilet with mod A with use of slide board.  OT Short Term Goal 1 - Progress (Week 1): Met OT Short Term Goal 2 (Week 1): Pt will perform UB dressing with mod A. OT Short Term Goal 2 - Progress (Week 1): Met OT Short Term Goal 3 (Week 1): Pt will engage in 10 minutes of functional task with 2 rest breaks or less secondary to fatigue. OT Short Term Goal 3 - Progress (Week 1): Met  Skilled Therapeutic Interventions/Progress Updates:    Pt greeted in recliner. Reported urgent need to void bladder. Lateral scoot<drop arm BSC completed with steady assist. Lateral leans for clothing mgt due to urgency. While pt was voiding bladder, openly conversed about her FOF and challenges with standing during therapies. She reports she keeps imagining herself falling forward due to past fall. Provided her support and encouragement, and we discussed coping strategy development. She was then agreeable to attempt to stand for clothing mgt post voiding. With OT in front of RW, pt standing and pulling up underwear/pants herself while alternating UE support on device. She required 1 seated rest break but able to accomplish. Pt then amenable to attempt stand pivot from BSC<recliner. With OT standing in front of RW again, pt completed power up with supervision, and then stepped to recliner with steady assist using RW! We celebrated! Max positive reinforcement provided afterwards and she expressed feelings of pride. Prior to session exit, placed all needs within reach.   Therapy Documentation Precautions:  Precautions Precautions: Fall Restrictions Weight Bearing Restrictions: No Vital Signs: Therapy Vitals Temp: 97.6 F (36.4  C) Temp Source: Oral Pulse Rate: (!) 118 Resp: 16 BP: (!) 156/77 Patient Position (if appropriate): Sitting Oxygen Therapy SpO2: 96 % O2 Device: Not Delivered Pain: No c/o pain during session    ADL:      See Function Navigator for Current Functional Status.   Therapy/Group: Individual Therapy  [name] 11/04/2017, 4:27 PM

## 2017-11-05 ENCOUNTER — Inpatient Hospital Stay (HOSPITAL_COMMUNITY): Payer: Medicare Other

## 2017-11-05 DIAGNOSIS — I10 Essential (primary) hypertension: Secondary | ICD-10-CM

## 2017-11-05 NOTE — Progress Notes (Signed)
Physical Therapy Session Note  Patient Details  Name: Sally [name] MRN: [id number] Date of Birth: April 29, 1951  Today's Date: 11/05/2017 PT Individual Time: 0800-0900 PT Individual Time Calculation (min): 60 min   Short Term Goals: Week 2:  PT Short Term Goal 1 (Week 2): STG =LTG due to ELOS  Skilled Therapeutic Interventions/Progress Updates:    Session focused on functional endurance and activity tolerance, sit <> stands, transfers with RW, and education about DME (including w/c for home and pt had questions about AFO's discussed with primary PT). Pt able to come to EOB with supervision and scoot to EOB. Cues for pursed lip breathing due to increased work of breathing with minimal activity. Pt request to use bathroom. Performed stand pivot transfers to w/c and then to/from toilet with overall min assist. Required 3 attempts for sit ->stand initially due to decreased confidence and pt fearful of falling. Facilitation for weightshift anteriorly and cues for hand placement. Demonstrated AFO per pt questions and discussed fitting process and options. Pt request photo for husband so took picture on her phone. Practiced sit <> stands with focus on technique and pre-gait activities in standing to increase standing tolerance and confidence in strength. Pt able to maintain up to 1.5 min at a time. Rest breaks needed between sets.   Therapy Documentation Precautions:  Precautions Precautions: Fall Restrictions Weight Bearing Restrictions: No  Pain:  Denies pain.    See Function Navigator for Current Functional Status.   Therapy/Group: Individual Therapy  Karolee Stamps Darrol Poke, PT, DPT  11/05/2017, 9:46 AM

## 2017-11-05 NOTE — Progress Notes (Signed)
Subjective/Complaints: Patient seen sitting up in bed this morning. She states she slept very well overnight. She states she feels very well rested today. She has questions about her blood clots and the need for anticoagulation at discharge.  Review of systems Denies chest pain, shortness of breath, nausea, diarrhea  Objective: Vital Signs: Blood pressure (!) 159/59, pulse 87, temperature 98.1 F (36.7 C), temperature source Oral, resp. rate 18, height 5\' 2"  (1.575 m), weight 72 kg (158 lb 11.7 oz), SpO2 98 %.  No results found for this or any previous visit (from the past 72 hour(s)).   HEENT: Trach site with dressing C/D/I Cardio: RRR and No JVD Resp: CTA B/L and Unlabored GI: BS positive and nondistended Musculoskeletal:  No Edema, no tenderness in extremities Skin:   Intact. Warm and dry. Neuro: Alert/Oriented,  Motor: Bilateral upper extremities 4/5 proximal to distal Bilateral lower extremity is: 4-/5 hip flexion, knee extension, 2 -/5 ankle dorsi/plantar flexion (stable) General no acute distress. Vital signs reviewed.   Assessment/Plan: 1. 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 Physiatrist is providing close team supervision and 24 hour management of active medical problems listed below. Physiatrist and rehab team continue to assess barriers to discharge/monitor patient progress toward functional and medical goals. FIM: Function - Bathing Bathing activity did not occur: Refused Position: Shower Body parts bathed by patient: Right arm, Left arm, Chest, Abdomen, Right upper leg, Left upper leg, Right lower leg, Left lower leg, Front perineal area, Buttocks Body parts bathed by helper: Back Bathing not applicable: Right lower leg, Left lower leg, Right upper leg, Left upper leg, Front perineal area, Buttocks(Declined LB) Assist Level:  Touching or steadying assistance(Pt > 75%)  Function- Upper Body Dressing/Undressing Upper body dressing/undressing activity did not occur: Refused What is the patient wearing?: Pull over shirt/dress Pull over shirt/dress - Perfomed by patient: Thread/unthread right sleeve, Thread/unthread left sleeve, Put head through opening, Pull shirt over trunk Assist Level: Set up Set up : To obtain clothing/put away Function - Lower Body Dressing/Undressing What is the patient wearing?: Pants, Non-skid slipper socks Position: Wheelchair/chair at sink Pants- Performed by patient: Thread/unthread right pants leg, Thread/unthread left pants leg, Pull pants up/down Pants- Performed by helper: Pull pants up/down Non-skid slipper socks- Performed by patient: Don/doff right sock, Don/doff left sock Non-skid slipper socks- Performed by helper: Don/doff right sock, Don/doff left sock(don) Assist for footwear: Supervision/touching assist Assist for lower body dressing: Touching or steadying assistance (Pt > 75%)  Function - Toileting Toileting activity did not occur: No continent bowel/bladder event Toileting steps completed by patient: Adjust clothing prior to toileting, Performs perineal hygiene, Adjust clothing after toileting Toileting steps completed by helper: Adjust clothing prior to toileting, Adjust clothing after toileting Toileting Assistive Devices: Other (comment)(BSC) Assist level: Touching or steadying assistance (Pt.75%)  Function - Toilet Transfers Toilet transfer assistive device: Drop arm commode, Walker, Bedside commode Assist level to toilet: Moderate assist (Pt 50 - 74%/lift or lower) Assist level from toilet: Moderate assist (Pt 50 - 74%/lift or lower) Assist level to bedside commode (at bedside): Touching or steadying assistance (Pt > 75%) Assist level from bedside commode (at bedside): Touching or steadying assistance (Pt > 75%)  Function - Chair/bed transfer Chair/bed transfer  method: Stand pivot Chair/bed transfer assist level: Touching or steadying assistance (Pt > 75%) Chair/bed transfer assistive device: Walker Chair/bed transfer details: Verbal cues for technique, Manual facilitation  for placement, Manual facilitation for weight shifting, Verbal cues for precautions/safety  Function - Locomotion: Wheelchair Will patient use wheelchair at discharge?: (TBD) Type: Manual Max wheelchair distance: 122ft Assist Level: Supervision or verbal cues Assist Level: Supervision or verbal cues Wheel 150 feet activity did not occur: Safety/medical concerns Assist Level: Supervision or verbal cues Turns around,maneuvers to table,bed, and toilet,negotiates 3% grade,maneuvers on rugs and over doorsills: No Function - Locomotion: Ambulation Ambulation activity did not occur: Safety/medical concerns Assistive device: Walker-rolling Max distance: 42ft  Assist level: Touching or steadying assistance (Pt > 75%) Walk 10 feet activity did not occur: Safety/medical concerns Assist level: Touching or steadying assistance (Pt > 75%) Walk 50 feet with 2 turns activity did not occur: Safety/medical concerns Walk 150 feet activity did not occur: Safety/medical concerns Walk 10 feet on uneven surfaces activity did not occur: Safety/medical concerns  Function - Comprehension Comprehension: Auditory Comprehension assist level: Follows basic conversation/direction with no assist  Function - Expression Expression: Verbal Expression assist level: Expresses basic needs/ideas: With no assist  Function - Social Interaction Social Interaction assist level: Interacts appropriately 90% of the time - Needs monitoring or encouragement for participation or interaction.  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 DVT Prophylaxis/Anticoagulation: Subcutaneous heparin.   Small isolated asymptomatic left peroneal  DVT, stable on repeat study on 2/8, plan for follow-up later this week 3. Pain Management:Hydrocodone as needed. -schedule tylenol bid -kpad 4. Mood:Ativan 0.5 mg every 4 hours as needed.Provide emotional support 5. Neuropsych: This patientiscapable of making decisions on herown behalf. 6. Skin/Wound Care:Routine skin checks 7. Fluids/Electrolytes/Nutrition:Routine I&O's 8.Severe COPD with tobacco abuse. Continue nebulizers. Provide counseling regarding cessation of nicotine products.NicoDerm patch as well as prednisone taper, No SOB 9.Right-sided pneumothorax. Chest tube removed 10/13/2017, no shortness of breath 10. S/p Trach: decannulated. Continue compressive dressing daily, some air leakage, as d/w pt this will heal slowly due to steroids, 11.  Leukocytosis likely steroid related, afebrile   WBCs 14.2 on 2/4    Labs ordered for tomorrow   Continue to monitor  12.  Vaginal itching some improvement no rash in peri area, has received Diflucan x 1 which is usual dose for vaginal candidiasis  13. Acute blood loss anemia   Hemoglobin 11.7 on 2/4   Continue to monitor 14. Hypoalbuminemia   Supplement initiated on 2/9  15. Hypertension   Continue Cardizem   Elevated in last 24 hours. Will consider medication adjustments if persistently elevated.  LOS (Days) 9 A FACE TO FACE EVALUATION WAS PERFORMED  Sally [name] Karis Juba 11/05/2017, 7:24  AM

## 2017-11-06 ENCOUNTER — Inpatient Hospital Stay (HOSPITAL_COMMUNITY): Payer: Medicare Other | Admitting: Speech Pathology

## 2017-11-06 ENCOUNTER — Inpatient Hospital Stay (HOSPITAL_COMMUNITY): Payer: Medicare Other | Admitting: Occupational Therapy

## 2017-11-06 ENCOUNTER — Inpatient Hospital Stay (HOSPITAL_COMMUNITY): Payer: Medicare Other | Admitting: Physical Therapy

## 2017-11-06 DIAGNOSIS — J410 Simple chronic bronchitis: Secondary | ICD-10-CM

## 2017-11-06 LAB — CBC WITH DIFFERENTIAL/PLATELET
BASOS ABS: 0 10*3/uL (ref 0.0–0.1)
BASOS PCT: 0 %
EOS PCT: 0 %
Eosinophils Absolute: 0.1 10*3/uL (ref 0.0–0.7)
HEMATOCRIT: 35.6 % — AB (ref 36.0–46.0)
Hemoglobin: 11.5 g/dL — ABNORMAL LOW (ref 12.0–15.0)
Lymphocytes Relative: 23 %
Lymphs Abs: 3.3 10*3/uL (ref 0.7–4.0)
MCH: 32 pg (ref 26.0–34.0)
MCHC: 32.3 g/dL (ref 30.0–36.0)
MCV: 99.2 fL (ref 78.0–100.0)
MONO ABS: 0.8 10*3/uL (ref 0.1–1.0)
MONOS PCT: 5 %
NEUTROS ABS: 10.4 10*3/uL — AB (ref 1.7–7.7)
Neutrophils Relative %: 72 %
PLATELETS: 363 10*3/uL (ref 150–400)
RBC: 3.59 MIL/uL — ABNORMAL LOW (ref 3.87–5.11)
RDW: 17.5 % — AB (ref 11.5–15.5)
WBC: 14.5 10*3/uL — ABNORMAL HIGH (ref 4.0–10.5)

## 2017-11-06 NOTE — Progress Notes (Signed)
Subjective/Complaints: Asked me if there was any way "to speed this up".  Want to know what to expect from the weakness in her feet.  ROS: pt denies nausea, vomiting, diarrhea, cough, shortness of breath or chest pain   Objective: Vital Signs: Blood pressure (!) 155/66, pulse (!) 104, temperature 97.8 F (36.6 C), temperature source Oral, resp. rate 18, height 5\' 2"  (1.575 m), weight 71.8 kg (158 lb 4.6 oz), SpO2 96 %.  No results found for this or any previous visit (from the past 72 hour(s)).   HEENT: Trach site with dressing C/D/I Cardio: RR/tachy without murmur. No JVD  Resp: CTA Bilaterally without wheezes or rales. Normal effort  GI: BS positive and nondistended Musculoskeletal:  No Edema, no tenderness in extremities Skin:   Intact. Warm and dry. Neuro: Alert/Oriented,  Motor: Bilateral upper extremities 4/5 proximal to distal Bilateral lower extremity is: 4-/5 hip flexion, knee extension, 2 -/5 ankle dorsi/plantar flexion (stable) General no acute distress. Vital signs reviewed.   Assessment/Plan: 1. 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 Physiatrist is providing close team supervision and 24 hour management of active medical problems listed below. Physiatrist and rehab team continue to assess barriers to discharge/monitor patient progress toward functional and medical goals. FIM: Function - Bathing Bathing activity did not occur: Refused Position: Shower Body parts bathed by patient: Right arm, Left arm, Chest, Abdomen, Right upper leg, Left upper leg, Right lower leg, Left lower leg, Front perineal area, Buttocks Body parts bathed by helper: Back Bathing not applicable: Right lower leg, Left lower leg, Right upper leg, Left upper leg, Front perineal area, Buttocks(Declined LB) Assist Level: Touching or steadying assistance(Pt >  75%)  Function- Upper Body Dressing/Undressing Upper body dressing/undressing activity did not occur: Refused What is the patient wearing?: Pull over shirt/dress Pull over shirt/dress - Perfomed by patient: Thread/unthread right sleeve, Thread/unthread left sleeve, Put head through opening, Pull shirt over trunk Assist Level: Set up Set up : To obtain clothing/put away Function - Lower Body Dressing/Undressing What is the patient wearing?: Pants, Non-skid slipper socks Position: Wheelchair/chair at sink Pants- Performed by patient: Thread/unthread right pants leg, Thread/unthread left pants leg, Pull pants up/down Pants- Performed by helper: Pull pants up/down Non-skid slipper socks- Performed by patient: Don/doff right sock, Don/doff left sock Non-skid slipper socks- Performed by helper: Don/doff right sock, Don/doff left sock(don) Assist for footwear: Supervision/touching assist Assist for lower body dressing: Touching or steadying assistance (Pt > 75%)  Function - Toileting Toileting activity did not occur: No continent bowel/bladder event Toileting steps completed by patient: Performs perineal hygiene Toileting steps completed by helper: Adjust clothing prior to toileting, Adjust clothing after toileting Toileting Assistive Devices: Grab bar or rail Assist level: Touching or steadying assistance (Pt.75%)  Function - Archivist transfer assistive device: Elevated toilet seat/BSC over toilet, Grab bar Assist level to toilet: Moderate assist (Pt 50 - 74%/lift or lower) Assist level from toilet: Touching or steadying assistance (Pt > 75%) Assist level to bedside commode (at bedside): Touching or steadying assistance (Pt > 75%) Assist level from bedside commode (at bedside): Touching or steadying assistance (Pt > 75%)  Function - Chair/bed transfer Chair/bed transfer method: Stand pivot Chair/bed transfer assist level: Touching or steadying assistance (Pt >  75%) Chair/bed transfer assistive device: Armrests, Walker Chair/bed transfer details: Verbal cues for technique, Manual facilitation for placement, Manual facilitation for weight shifting, Verbal  cues for precautions/safety  Function - Locomotion: Wheelchair Will patient use wheelchair at discharge?: (TBD) Type: Manual Max wheelchair distance: 128ft Assist Level: Supervision or verbal cues Assist Level: Supervision or verbal cues Wheel 150 feet activity did not occur: Safety/medical concerns Assist Level: Supervision or verbal cues Turns around,maneuvers to table,bed, and toilet,negotiates 3% grade,maneuvers on rugs and over doorsills: No Function - Locomotion: Ambulation Ambulation activity did not occur: Safety/medical concerns Assistive device: Walker-rolling Max distance: 100ft  Assist level: Touching or steadying assistance (Pt > 75%) Walk 10 feet activity did not occur: Safety/medical concerns Assist level: Touching or steadying assistance (Pt > 75%) Walk 50 feet with 2 turns activity did not occur: Safety/medical concerns Walk 150 feet activity did not occur: Safety/medical concerns Walk 10 feet on uneven surfaces activity did not occur: Safety/medical concerns  Function - Comprehension Comprehension: Auditory Comprehension assist level: Understands complex 90% of the time/cues 10% of the time  Function - Expression Expression: Verbal Expression assist level: Expresses complex 90% of the time/cues < 10% of the time  Function - Social Interaction Social Interaction assist level: Interacts appropriately 90% of the time - Needs monitoring or encouragement for participation or interaction.  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 2. DVT Prophylaxis/Anticoagulation: Subcutaneous heparin.   Small isolated asymptomatic left peroneal  DVT, stable on repeat study on 2/8, plan for follow-up later this week 3. Pain Management:Hydrocodone as needed. -schedule tylenol bid -kpad 4. Mood:Ativan 0.5 mg every 4 hours as needed.Provide emotional support 5. Neuropsych: This patientiscapable of making decisions on herown behalf. 6. Skin/Wound Care:Routine skin checks 7. Fluids/Electrolytes/Nutrition:Routine I&O's 8.Severe COPD with tobacco abuse. Continue nebulizers. Provide counseling regarding cessation of nicotine products.NicoDerm patch as well as prednisone taper, No SOB 9.Right-sided pneumothorax. Chest tube removed 10/13/2017, no shortness of breath 10. S/p Trach: decannulated. Continue compressive dressing daily, some air leakage, as d/w pt this will heal slowly due to steroids, 11.  Leukocytosis likely steroid related, afebrile   WBCs 14.2 on 2/4    Labs pending for today   Continue to monitor  12.  Vaginal itching some improvement no rash in peri area, has received Diflucan x 1 which is usual dose for vaginal candidiasis  13. Acute blood loss anemia   Hemoglobin 11.7 on 2/4   Continue to monitor 14. Hypoalbuminemia   Supplement initiated on 2/9  15. Hypertension   Continue Cardizem   Some improvement over the last 24 hours  LOS (Days) 10 A FACE TO FACE EVALUATION WAS PERFORMED  Usher Hedberg T 11/06/2017, 8:43 AM

## 2017-11-06 NOTE — Progress Notes (Signed)
Physical Therapy Session Note  Patient Details  Name: Sally [name] MRN: [id number] Date of Birth: Mar 14, 1951  Today's Date: 11/06/2017 PT Individual Time: 1130-1200 and 1500-1530 PT Individual Time Calculation (min): 30 min and 30 min  Short Term Goals: Week 2:  PT Short Term Goal 1 (Week 2): STG =LTG due to ELOS  Skilled Therapeutic Interventions/Progress Updates: Tx1: Pt presented in w/c with dgt present agreeable to therapy. Pt and dgt asking questions regarding AFO and shoe sizing as current shoe too small. PTA answered to best of capabilities. Dgt requesting if able to see AFO, pt propelled to rehab gym supervision and PTA demonstrated AFO and placed on pt's foot for better clearer visual demonstration. Dgt satisfied with demo, pt transported to parallel bars and pt pulls self up and ambulated 20f x 2 in bars. Pt noted to have heavy use of BUE and due to lack of AFO poor clearance of BLE. Pt was able to compete ambulation and perform turn withouth break and returned to w/c overall with min guard. Pt noted to have increased dyspnea with SpO2 98% HR 113. Pt returned to room and noted to feel good about today's session. Pt remained in w/c at end of session with needs met and family present.   Tx2: Pt presented in w/c agreeable to therapy. Pt indicating increased pain in dorsum of foot compared to am session. Pt TTP at peroneus tertius. PTA adv hold additional gait this session. Session therefore focused on strengthening and endurance. Pt propelled to day room and perform squat pivot to NuStep with min guard and increased time. Participated in NuStep L1 x 4 min without break, SpO2 97% and HR 118 after activities. Pt returned to w/c in same manner as prior and performed UBE x 3 min L1 with no rest. Pt then able to propel back to room with supervision and remained in w/c at end of session with needs met.       Therapy Documentation Precautions:  Precautions Precautions:  Fall Restrictions Weight Bearing Restrictions: No General:   Vital Signs: Therapy Vitals Temp: 98.1 F (36.7 C) Temp Source: Oral Pulse Rate: (!) 109 Resp: 18 BP: (!) 160/67 Patient Position (if appropriate): Sitting Oxygen Therapy SpO2: 100 % O2 Device: Not Delivered  See Function Navigator for Current Functional Status.   Therapy/Group: Individual Therapy  Edmond Ginsberg  Saray Capasso, PTA  11/06/2017, 4:13 PM

## 2017-11-06 NOTE — Progress Notes (Signed)
Speech Language Pathology Daily Session Note  Patient Details  Name: Sally [name] MRN: [id number] Date of Birth: December 20, 1950  Today's Date: 11/06/2017 SLP Individual Time: 1330-1430 SLP Individual Time Calculation (min): 60 min  Short Term Goals: Week 2: SLP Short Term Goal 1 (Week 2): Pt will utilize speech intelligibility strategies with Mod I to achieve ~ 90 intelligibility at the simple conversation in a mildly noisey environment.  SLP Short Term Goal 2 (Week 2): Pt will complete 3 sets of 10 of EMST set at  45 cmH2O with self-perceived level of effort of 8 out of 10.  SLP Short Term Goal 3 (Week 2): Pt will complete 3 sets of 10 of IMST set at 45  cmH2Owith self-perceived level of effort of 8 out of 10.   Skilled Therapeutic Interventions: Skilled treatment session focused on improving voicing and speech intelligibility. SLP facilitated session by providing skilled observation and increasing settings on IMST/EMST devices. Pt able to increase EMST to 45 cmH2O and IMST to 39 cmH2O to achieve perceived effort of 8 to 9 out of 10. With Min A faded to supervision cues, pt able to increase vocal intensity to achieve 100% intelligibility at the simple conversation level in a moderately noisy environment. SLP placed visual cue to "YELL" in room as reminder. Pt with good progress.      Function:  Eating Eating     Eating Assist Level: No help, No cues           Cognition Comprehension Comprehension assist level: Follows complex conversation/direction with extra time/assistive device  Expression   Expression assist level: Expresses complex ideas: With extra time/assistive device  Social Interaction Social Interaction assist level: Interacts appropriately with others with medication or extra time (anti-anxiety, antidepressant).  Problem Solving Problem solving assist level: Solves complex 90% of the time/cues < 10% of the time  Memory Memory assist level: More than reasonable amount of  time    Pain Pain Assessment Pain Assessment: No/denies pain  Therapy/Group: Individual Therapy  Sally [name] 11/06/2017, 2:54 PM

## 2017-11-06 NOTE — Progress Notes (Signed)
Occupational Therapy Session Note  Patient Details  Name: Sally [name] MRN: [id number] Date of Birth: [birth year]  Today's Date: 11/06/2017 OT Individual Time: 0945-1100 OT Individual Time Calculation (min): 75 min    Short Term Goals: Week 2:  OT Short Term Goal 1 (Week 2): Pt will complete stand pivot transfer with use of RW and min A OT Short Term Goal 2 (Week 2): Pt will stand at RW to complete clothing management during toileting or LB dressing with steadying assist OT Short Term Goal 3 (Week 2): Pt will complete 3/3 toileting tasks with steadying assist OT Short Term Goal 4 (Week 2): Pt will stand at sink to complete 1 grooming task in order to increase functional activity tolerance  Skilled Therapeutic Interventions/Progress Updates:    Pt seen for OT ADL bathing/dressing session. Pt asleep in supine upon arrival, easily awoken and desiring to shower. She transferred to EOB using hospital bed functions mod I. Completed squat pivot transfer to w /c with VCs for hand placement. In shower, stood at grab bars to complete stand pivot transfer to tub bench. Although pt requires increased time with all transfers due to high anxiety, transfers are completed with min A. She bathed seated on tub bench with supervision, VCs for use of lateral leans to complete buttock hygiene.  She transitioned out of shower and dressed seated in w/c.  Required increased time and rest breaks to complete all aspects, however, only required assist when standing at RW to pull pants up, which was encouragement to pull her own pants up and min steadying assist.  Following rest break, she ambulated ~81ft to sink and completed oral hygiene standing at sink with close supervision. She then was agreeable to ambulation in hallway, set own target goal of next doorway in hallway, and ambulated full distance to target, ~37ft with CGA. Pt's daughter and CSW present providing encouragement. Pt returned to room at end of session,  left seated in w/c with all needs in reach and family present. Pt cont to make excellent gains in therapy. Discussed upgrade of pt goals as well as potential for moving up d/c date if great prgoress cont. Pt desiring to stay in rehab as long as possible in order to get to most independent level possible.   Therapy Documentation Precautions:  Precautions Precautions: Fall Restrictions Weight Bearing Restrictions: No Pain: Pain Assessment Pain Assessment: 0-10 Pain Score: No/ denies pain  See Function Navigator for Current Functional Status.   Therapy/Group: Individual Therapy  Sally [name] 11/06/2017, 7:12 AM

## 2017-11-07 ENCOUNTER — Inpatient Hospital Stay (HOSPITAL_COMMUNITY): Payer: Medicare Other | Admitting: Occupational Therapy

## 2017-11-07 ENCOUNTER — Inpatient Hospital Stay (HOSPITAL_COMMUNITY): Payer: Medicare Other

## 2017-11-07 ENCOUNTER — Inpatient Hospital Stay (HOSPITAL_COMMUNITY): Payer: Medicare Other | Admitting: Physical Therapy

## 2017-11-07 NOTE — Plan of Care (Signed)
  Progressing RH BOWEL ELIMINATION RH STG MANAGE BOWEL WITH ASSISTANCE Description STG Manage Bowel with min Assistance.  11/07/2017 0509 - Progressing by Marigene Ehlers, RN RH BLADDER ELIMINATION RH STG MANAGE BLADDER WITH ASSISTANCE Description STG Manage Bladder With min Assistance  11/07/2017 0509 - Progressing by Marigene Ehlers, RN RH SKIN INTEGRITY RH STG SKIN FREE OF INFECTION/BREAKDOWN Description While on rehab  11/07/2017 0509 - Progressing by Marigene Ehlers, RN RH SAFETY RH STG ADHERE TO SAFETY PRECAUTIONS W/ASSISTANCE/DEVICE Description STG Adhere to Safety Precautions With min Assistance/Device.  11/07/2017 0509 - Progressing by Marigene Ehlers, RN RH PAIN MANAGEMENT RH STG PAIN MANAGED AT OR BELOW PT'S PAIN GOAL Description Less than 3 out of 10  11/07/2017 0509 - Progressing by Marigene Ehlers, RN

## 2017-11-07 NOTE — Plan of Care (Signed)
Goals upgraded to supervision overall due to pt progress. See POC for goal details. Caz Weaver, OTR/L

## 2017-11-07 NOTE — Progress Notes (Signed)
Occupational Therapy Session Note  Patient Details  Name: Sally [name] MRN: [id number] Date of Birth: [birth year]  Today's Date: 11/07/2017 OT Individual Time: 0945-1100 OT Individual Time Calculation (min): 75 min    Short Term Goals: Week 2:  OT Short Term Goal 1 (Week 2): Pt will complete stand pivot transfer with use of RW and min A OT Short Term Goal 2 (Week 2): Pt will stand at RW to complete clothing management during toileting or LB dressing with steadying assist OT Short Term Goal 3 (Week 2): Pt will complete 3/3 toileting tasks with steadying assist OT Short Term Goal 4 (Week 2): Pt will stand at sink to complete 1 grooming task in order to increase functional activity tolerance  Skilled Therapeutic Interventions/Progress Updates:    Pt seen for OT session focusing on functiona, ambulation, transfers and ADL re-training. Pt in supine upon arrival, agreeable to tx session. Voiced increased nerve pain/ "heat" in R LE, MD aware and no change in orders. Pt agreeable to cont with therapy.  SHe transferred to EOB from flat be din simulation of home environment with supervision. She ambulated ~70ft towards bathroom using RW with min A, VCs for deep breathing technique due to anxiety. Pt becoming overly anxious when she reached steep threshold going into bathroom, unable to calm down and requested w/c for remainder of way into bathroom. Completed stand pivot transfer from w/c to toilet, with use of grab bar.  She completed bathing/dressing from w/c level at sink, with set-up for UB. She stood at [facility] with guarding assist to complete pericare/buttock hygiene and pull pants up. Extended rest breaks required following standing trials. She brushed hair standing at sink with close supervision, pt requrring one UE support on sink ledge.  Following rest break, pt taken to ADL apartment in w/c for time and energy conservation. She completed simulated tub/shower transfer utilizing tub bench. Following  demontration for technique of transfer, pt completed transfer with guarding assist. Pt voiced feeling comfortable with this transfer method and believed it will work well at home setting.  She self propelled w/c back to room for UE strengthening/ endurance with supervision. She ambulated ~21ft to recliner and left seated in recliner with all needs in reach.   Therapy Documentation Precautions:  Precautions Precautions: Fall Restrictions Weight Bearing Restrictions: No  See Function Navigator for Current Functional Status.   Therapy/Group: Individual Therapy  Merly Hinkson L 11/07/2017, 7:09 AM

## 2017-11-07 NOTE — Progress Notes (Signed)
Subjective/Complaints:  Some buring pain in R foot, Pt feels like foot is "getting sensation back"  ROS: pt denies nausea, vomiting, diarrhea, cough, shortness of breath or chest pain   Objective: Vital Signs: Blood pressure (!) 152/63, pulse 93, temperature 98.1 F (36.7 C), temperature source Oral, resp. rate 18, height 5\' 2"  (1.575 m), weight 72 kg (158 lb 11.7 oz), SpO2 96 %.  Results for orders placed or performed during the hospital encounter of 10/27/17 (from the past 72 hour(s))  CBC with Differential/Platelet     Status: Abnormal   Collection Time: 11/06/17  8:33 AM  Result Value Ref Range   WBC 14.5 (H) 4.0 - 10.5 K/uL   RBC 3.59 (L) 3.87 - 5.11 MIL/uL   Hemoglobin 11.5 (L) 12.0 - 15.0 g/dL   HCT 16.1 (L) 09.6 - 04.5 %   MCV 99.2 78.0 - 100.0 fL   MCH 32.0 26.0 - 34.0 pg   MCHC 32.3 30.0 - 36.0 g/dL   RDW 40.9 (H) 81.1 - 91.4 %   Platelets 363 150 - 400 K/uL   Neutrophils Relative % 72 %   Neutro Abs 10.4 (H) 1.7 - 7.7 K/uL   Lymphocytes Relative 23 %   Lymphs Abs 3.3 0.7 - 4.0 K/uL   Monocytes Relative 5 %   Monocytes Absolute 0.8 0.1 - 1.0 K/uL   Eosinophils Relative 0 %   Eosinophils Absolute 0.1 0.0 - 0.7 K/uL   Basophils Relative 0 %   Basophils Absolute 0.0 0.0 - 0.1 K/uL    Comment: Performed at [facility], [address] [place], [place] [address]     HEENT: Janina Mayo site with dressing C/D/I Cardio: RR/tachy without murmur. No JVD  Resp: CTA Bilaterally without wheezes or rales. Normal effort  GI: BS positive and nondistended Musculoskeletal:  No Edema, no tenderness in extremities Skin:   Intact. Warm and dry. Neuro: Alert/Oriented,  Motor: Bilateral upper extremities 4/5 proximal to distal Bilateral lower extremity is: 4-/5 hip flexion, knee extension, 2 -/5 ankle dorsi/plantar flexion (stable) General no acute distress. Vital signs reviewed.   Assessment/Plan: 1. 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 Physiatrist is providing close team supervision and 24 hour management of active medical problems listed below. Physiatrist and rehab team continue to assess barriers to discharge/monitor patient progress toward functional and medical goals. FIM: Function - Bathing Bathing activity did not occur: Refused Position: Shower Body parts bathed by patient: Right arm, Left arm, Chest, Abdomen, Right upper leg, Left upper leg, Right lower leg, Left lower leg, Front perineal area, Buttocks Body parts bathed by helper: Back Bathing not applicable: Right lower leg, Left lower leg, Right upper leg, Left upper leg, Front perineal area, Buttocks(Declined LB) Assist Level: Supervision or verbal cues  Function- Upper Body Dressing/Undressing Upper body dressing/undressing activity did not occur: Refused What is the patient wearing?: Pull over shirt/dress Pull over shirt/dress - Perfomed by patient: Thread/unthread right sleeve, Thread/unthread left sleeve, Put head through opening, Pull shirt over trunk Assist Level: Supervision or verbal cues Set up : To obtain clothing/put away Function - Lower Body Dressing/Undressing What is the patient wearing?: Underwear, Pants, Non-skid slipper socks Position: Wheelchair/chair at sink Underwear - Performed by patient: Thread/unthread right underwear leg, Thread/unthread left underwear leg, Pull underwear up/down Pants- Performed by patient: Thread/unthread right pants leg, Thread/unthread left pants leg, Pull pants up/down Pants- Performed by  helper: Pull pants up/down Non-skid slipper socks- Performed by patient: Don/doff right sock, Don/doff left sock Non-skid slipper socks- Performed by helper: Don/doff right sock, Don/doff left sock(don) Assist for footwear: Supervision/touching assist Assist for lower body dressing: Touching or steadying assistance (Pt >  75%)  Function - Toileting Toileting activity did not occur: No continent bowel/bladder event Toileting steps completed by patient: Performs perineal hygiene Toileting steps completed by helper: Adjust clothing prior to toileting, Adjust clothing after toileting Toileting Assistive Devices: Grab bar or rail Assist level: Touching or steadying assistance (Pt.75%)  Function - Archivist transfer assistive device: Elevated toilet seat/BSC over toilet, Grab bar Assist level to toilet: Moderate assist (Pt 50 - 74%/lift or lower) Assist level from toilet: Touching or steadying assistance (Pt > 75%) Assist level to bedside commode (at bedside): Touching or steadying assistance (Pt > 75%) Assist level from bedside commode (at bedside): Touching or steadying assistance (Pt > 75%)  Function - Chair/bed transfer Chair/bed transfer method: Stand pivot Chair/bed transfer assist level: Touching or steadying assistance (Pt > 75%) Chair/bed transfer assistive device: Armrests, Walker Chair/bed transfer details: Verbal cues for technique, Manual facilitation for placement, Manual facilitation for weight shifting, Verbal cues for precautions/safety  Function - Locomotion: Wheelchair Will patient use wheelchair at discharge?: (TBD) Type: Manual Max wheelchair distance: 149ft Assist Level: Supervision or verbal cues Assist Level: Supervision or verbal cues Wheel 150 feet activity did not occur: Safety/medical concerns Assist Level: Supervision or verbal cues Turns around,maneuvers to table,bed, and toilet,negotiates 3% grade,maneuvers on rugs and over doorsills: No Function - Locomotion: Ambulation Ambulation activity did not occur: Safety/medical concerns Assistive device: Walker-rolling Max distance: 36ft  Assist level: Touching or steadying assistance (Pt > 75%) Walk 10 feet activity did not occur: Safety/medical concerns Assist level: Touching or steadying assistance (Pt >  75%) Walk 50 feet with 2 turns activity did not occur: Safety/medical concerns Walk 150 feet activity did not occur: Safety/medical concerns Walk 10 feet on uneven surfaces activity did not occur: Safety/medical concerns  Function - Comprehension Comprehension: Auditory Comprehension assist level: Follows complex conversation/direction with extra time/assistive device  Function - Expression Expression: Verbal Expression assist level: Expresses complex ideas: With extra time/assistive device  Function - Social Interaction Social Interaction assist level: Interacts appropriately with others with medication or extra time (anti-anxiety, antidepressant).  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 2. DVT Prophylaxis/Anticoagulation: Subcutaneous heparin.   Small isolated asymptomatic left peroneal  DVT, stable on repeat study on 2/8, plan for follow-up later this week 3. Pain Management:Hydrocodone as needed. -schedule tylenol bid -kpad 4. Mood:Ativan 0.5 mg every 4 hours as needed.Provide emotional support 5. Neuropsych: This patientiscapable of making decisions on herown behalf. 6. Skin/Wound Care:Routine skin checks 7. Fluids/Electrolytes/Nutrition:Routine I&O's 8.Severe COPD with  tobacco abuse. Continue nebulizers. Provide counseling regarding cessation of nicotine products.NicoDerm patch as well as prednisone taper, No SOB 9.Right-sided pneumothorax. Chest tube removed 10/13/2017, no  shortness of breath 10. S/p Trach: decannulated. Continue compressive dressing daily, some air leakage, as d/w pt this will , 11.  Leukocytosis likely steroid related, afebrile   WBCs 14.2 on 2/4 , should normalize off oral steroids   Labs pending for today   Continue to monitor  12.  Vaginal itching some improvement no rash in peri area, has received Diflucan x 1 which is usual dose for vaginal candidiasis  13. Acute blood loss anemia   Hemoglobin 11.5 on 2/11   Continue to monitor 14. Hypoalbuminemia   Supplement initiated on 2/9  15. Hypertension   Continue Cardizem    Vitals:   11/06/17 2232 11/07/17 0425  BP: 126/64 (!) 152/63  Pulse:  93  Resp:  18  Temp:  98.1 F (36.7 C)  SpO2:  96%  systolic BP still fluctuating  LOS (Days) 11 A FACE TO FACE EVALUATION WAS PERFORMED  Erick Colace 11/07/2017, 7:46 AM

## 2017-11-07 NOTE — Progress Notes (Signed)
Physical Therapy Session Note  Patient Details  Name: Niti Kohtz MRN: [id number] Date of Birth: 12/06/1950  Today's Date: 11/07/2017 PT Individual Time: 1305-1405 PT Individual Time Calculation (min): 60 min   Short Term Goals: Week 2:  PT Short Term Goal 1 (Week 2): STG =LTG due to ELOS  Skilled Therapeutic Interventions/Progress Updates: Pt presented in w/c c/o fatigue however agreeable to therapy. Pt transported to rehab gym for energy conservation. Pt performed sit to stand at high/low mat and participated in checkers game for standing tolerance for approx 5 min with alternating UE support. Pt continues to c/o dorsum foot pain, PTA performed STM at peroneus tertius region with HC stretching. Pt indicated she has also been attempting to perfom DF frequently in bed. PTA provided pt edu on limited continuous attempts of DF. Pt performed additional standing activity of peg board with pt indicating decreased pain at dorsum of foot. Pt participated in gait training with RW x 25ft with min guard. Pt noted to have improved foot clearance in LLE vs R. Pt expressed appreciation in recent improvements in mobility. Pt transported to day room and performed UBE level 1.5 x 4:30 with x 1 rest break. Pt transported back to room and handed off to SLP for next session.       Therapy Documentation Precautions:  Precautions Precautions: Fall Restrictions Weight Bearing Restrictions: No General:   Vital Signs:  Pain: Pain Assessment Pain Assessment: 0-10 Pain Score: 7  Pain Type: Chronic pain Pain Location: Back Pain Descriptors / Indicators: Aching Pain Onset: On-going Pain Intervention(s): Medication (See eMAR)   See Function Navigator for Current Functional Status.   Therapy/Group: Individual Therapy  Rosevelt Luu  Byrd Terrero, PTA  11/07/2017, 2:10 PM

## 2017-11-07 NOTE — Progress Notes (Signed)
Patient prefers 4 siderails up in bed for comfort.

## 2017-11-07 NOTE — Progress Notes (Signed)
Speech Language Pathology Discharge Summary  Patient Details  Name: Sally [name] MRN: [id number] Date of Birth: 12-26-1950  Today's Date: 11/07/2017 SLP Individual Time: 1300-1400 SLP Individual Time Calculation (min): 60 min   Skilled Therapeutic Interventions:  Skilled ST service focusing on speech skills. SLP facilitated EMST and IMST exercises, pt demonstrated 2 sets of 10 EMST at 45 cm H2O and IMST at 39 cm H20 with self perceived score of 6 out 10. SLP adjusted IMST to 41cm H20, however pt stated  It was too hard. SLP adjusted IMST back to 39 cm H20. Pt demonstrated 90% accuracy speech intelligibilty in conveersation with nursing students and 90% accuracy in structured communication barrier task with Mod I use of intelligibility strategies. SLP reviewed speech intelligibility strategies and to continue RMT until for increase vocal support. Pt agreed to discharge due to ability to manage speech intelligibility at Mod I level. All questions were answered to pt satisfaction. Pt was left in room with call bell within reach. Recommend to continue skilled ST services.     Patient has met 1 of 1 long term goals.  Patient to discharge at overall Modified Independent level.  Reasons goals not met:     Clinical Impression/Discharge Summary:    Pt demonstrate great progress meeting 1 out 1 speech intelligible goal focusing on vocal intensity, pt is discharging from skilled ST services at Mod I level utilizing speech intelligibility strategies. Pt continues to demonstrate slight hoarse and breathy vocal quality, however is 90% ntellgibility in conversation with Mod I use of strategies. Pt will no longer require skilled ST services for vocal intensity in order to maximize functional independence and reduce burden of care.   Care Partner:  Caregiver Able to Provide Assistance: Yes  Type of Caregiver Assistance: Physical  Recommendation:  None      Equipment:   None  Reasons for discharge:  Treatment goals met   Patient/Family Agrees with Progress Made and Goals Achieved: Yes   Function:  Eating Eating   Modified Consistency Diet: No Eating Assist Level: No help, No cues           Cognition Comprehension Comprehension assist level: Follows complex conversation/direction with extra time/assistive device  Expression   Expression assist level: Expresses complex ideas: With extra time/assistive device  Social Interaction Social Interaction assist level: Interacts appropriately with others with medication or extra time (anti-anxiety, antidepressant).  Problem Solving Problem solving assist level: Solves complex 90% of the time/cues < 10% of the time  Memory Memory assist level: More than reasonable amount of time   [name]  [facility] 11/07/2017, 5:07 PM

## 2017-11-08 ENCOUNTER — Inpatient Hospital Stay (HOSPITAL_COMMUNITY): Payer: Medicare Other | Admitting: Occupational Therapy

## 2017-11-08 ENCOUNTER — Encounter (HOSPITAL_COMMUNITY): Payer: Medicare Other | Admitting: Psychology

## 2017-11-08 ENCOUNTER — Inpatient Hospital Stay (HOSPITAL_COMMUNITY): Payer: Medicare Other | Admitting: Speech Pathology

## 2017-11-08 ENCOUNTER — Inpatient Hospital Stay (HOSPITAL_COMMUNITY): Payer: Medicare Other | Admitting: Physical Therapy

## 2017-11-08 DIAGNOSIS — F4321 Adjustment disorder with depressed mood: Secondary | ICD-10-CM

## 2017-11-08 MED ORDER — DULOXETINE HCL 20 MG PO CPEP
20.0000 mg | ORAL_CAPSULE | Freq: Every day | ORAL | Status: DC
  Administered 2017-11-08 – 2017-11-17 (×10): 20 mg via ORAL
  Filled 2017-11-08 (×10): qty 1

## 2017-11-08 MED ORDER — GABAPENTIN 100 MG PO CAPS
100.0000 mg | ORAL_CAPSULE | Freq: Three times a day (TID) | ORAL | Status: DC
  Administered 2017-11-08 – 2017-11-12 (×13): 100 mg via ORAL
  Filled 2017-11-08 (×13): qty 1

## 2017-11-08 NOTE — Patient Care Conference (Signed)
Inpatient RehabilitationTeam Conference and Plan of Care Update Date: 11/08/2017   Time: 10:50 AM    Patient Name: Sally [name]      Medical Record Number: [id number]  Date of Birth: 08-08-1951 Sex: Female         Room/Bed: 4W09C/4W09C-01 Payor Info: Payor: [employer] MEDICARE / Plan: [employer] MEDICARE / Product Type: *No Product type* /    Admitting Diagnosis: cril Illness neropathy  Admit Date/Time:  10/27/2017  8:07 PM Admission Comments: No comment available   Primary Diagnosis:  <principal problem not specified> Principal Problem: <principal problem not specified>  Patient Active Problem List   Diagnosis Date Noted  . Acute blood loss anemia   . Chronic obstructive pulmonary disease ([place])   . Deep venous thrombosis ([place])   . Critical illness myopathy   . 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 Leukocytosis   . Tracheostomy status ([place])   . Hypoxemia   . Pneumothorax, traumatic   . Acute respiratory failure ([place])   . Influenza A   . Acute on chronic respiratory failure ([place])   . Acute respiratory failure with hypoxemia ([place]) 09/26/2017  . RSV (acute bronchiolitis due to respiratory syncytial virus) 10/06/2016  . COPD exacerbation ([place]) 07/14/2016  . Acute respiratory failure with hypoxia and hypercapnia ([facility])   . Essential hypertension   . Dyslipidemia     Expected Discharge Date: Expected Discharge Date: 11/17/17  Team Members Present: Physician leading conference: Dr. Alysia Penna Social Worker Present: Alfonse Alpers, LCSW Nurse Present: Brita Romp, RN PT Present: Barrie Folk, PT;Rosita Dechalus, PTA OT Present: Napoleon Form, OT SLP Present: Charolett Bumpers, SLP PPS Coordinator present : Sally Nakayama, RN, CRRN     Current Status/Progress Goal Weekly Team Focus  Medical   Calf DVT is not progressing.  Sally [name] site is slowly healing.  Developing neuropathic pain.  Avoid extension of DVt  Recheck Doppler, address neuropathic pain   Bowel/Bladder   continent  of b/b  maintain continence  Assist with toileting as needed   Swallow/Nutrition/ Hydration             ADL's   Set-up UB bathing/dressing; CGA LB bathing/dressing; min A toileting; min A short distance ambulation and functional transfers  Upgraded to supervision overall due to pt progress  ADL re-training, functional activity tolerance, functional ambulation/ transfers   Mobility   supervision w/c mobility, min guard sit to/from stand transfers, gait 28f with RW min guard - awaiting shoes for AFO trial due to poor B feet placement  supervision assist transfers and gait for short distances. supervision assist WC mobility   edurance, functional transfers, gait, family ed   Communication   pt d/c'd from speech due to goals being met this past week         Safety/Cognition/ Behavioral Observations            Pain   c/o severe foot pain  pain < 2  Assess qshift and PRN, offer PRNs as ordered   Skin   abdominal bruising  maintain skin integrity  Assess qshift and prn    Rehab Goals Patient on target to meet rehab goals: Yes Rehab Goals Revised: none *See Care Plan and progress notes for long and short-term goals.     Barriers to Discharge  Current Status/Progress Possible Resolutions Date Resolved   Physician    Medical stability;Other (comments)  Neuropathic pain  Progressing with therapy  Recheck Doppler, medication management as above      Nursing  PT                    OT                  SLP                SW                Discharge Planning/Teaching Needs:  Pt plans to return to her home where her husband and family will assist her.  Pt's family will come for family education closer to d/c.   Team Discussion:  Pt with a small DVT in her calf which has not grown; plan is to have one more doppler to make sure it stays the same.  Pt still with little air leak at trach site and now has neuropathic foot pain - gabapentin started.  Dr. Letta Pate also  started cymbalta.  Pt is min A overall with OT, but she needs extra time due to anxiety and rest breaks for fatigue.  Goals were upgraded to supervision.  Pt is improving with PT, as well and is approaching min guard with txs and ambulation with RW.  Pt to be evaluated for AFOs.  ST has d/c'd pt due to goals being met; she just needs time to heal and has strategies and breathing exercises to work on vocal quality.  Revisions to Treatment Plan:  none    Continued Need for Acute Rehabilitation Level of Care: The patient requires daily medical management by a physician with specialized training in physical medicine and rehabilitation for the following conditions: Daily direction of a multidisciplinary physical rehabilitation program to ensure safe treatment while eliciting the highest outcome that is of practical value to the patient.: Yes Daily medical management of patient stability for increased activity during participation in an intensive rehabilitation regime.: Yes Daily analysis of laboratory values and/or radiology reports with any subsequent need for medication adjustment of medical intervention for : Pulmonary problems;Neurological problems  Meztli Llanas, Silvestre Mesi 11/08/2017, 11:39 AM

## 2017-11-08 NOTE — Progress Notes (Signed)
Orthopedic Tech Progress Note Patient Details:  Sally [name] 1950-11-12 [id number]  Patient ID: Sally [name], female   DOB: September 22, 1951, 67 y.o.   MRN: [id number]   Sally [name] 11/08/2017, 12:51 PM Called in [employer] brace order; spoke with Morrie Sheldon

## 2017-11-08 NOTE — Progress Notes (Signed)
Occupational Therapy Session Note  Patient Details  Name: Sally [name] MRN: [id number] Date of Birth: 03/16/1951  Today's Date: 11/08/2017 OT Individual Time: 0930-1030 OT Individual Time Calculation (min): 60 min    Short Term Goals: Week 2:  OT Short Term Goal 1 (Week 2): Pt will complete stand pivot transfer with use of RW and min A OT Short Term Goal 2 (Week 2): Pt will stand at RW to complete clothing management during toileting or LB dressing with steadying assist OT Short Term Goal 3 (Week 2): Pt will complete 3/3 toileting tasks with steadying assist OT Short Term Goal 4 (Week 2): Pt will stand at sink to complete 1 grooming task in order to increase functional activity tolerance  Skilled Therapeutic Interventions/Progress Updates:    Pt seen for OT ADL bathing/dressing session. Pt in supine upon arrival, agreeable to tx session and desiring to shower. She voiced increased in B LE nerve pain upon coming to sitting EOB. PT reports being placed on new medication to address nerve pain and she received that med 30 minutes prior to start of tx session.  With increased time and encouragement. Pt stood from EOB to RW and ambulated with min A to entrance of bathroom. Despite cuing for technique to cross elevated threshold and encouragement, pt's anxiety too high and requested urgent need to return to w/c. She then completed stand pivot transfer from w/c into shower with use of grab bars with close supervision when entering and exiting shower. She bathed with supervision, completing lateral leans for buttock hygiene. She returned to w/c to dress, standing at RW to complete LB clothing management with guarding assist, episode of requiring urgent seated rest break, plopping into chair. Following seated rest break, she completed grooming tasks standing at sink, requiring at least one and up to B UE support on sink ledge due to fatigue and perceived poor standing balance.    Therapy  Documentation Precautions:  Precautions Precautions: Fall Restrictions Weight Bearing Restrictions: No Pain:   No/ denies pain  See Function Navigator for Current Functional Status.   Therapy/Group: Individual Therapy  Emry Tobin L 11/08/2017, 7:11 AM

## 2017-11-08 NOTE — Progress Notes (Signed)
Subjective/Complaints:  R> L foot throbbing , worse at night, no redness noted , feels warm  ROS: pt denies nausea, vomiting, diarrhea, cough, shortness of breath or chest pain   Objective: Vital Signs: Blood pressure 131/64, pulse 96, temperature 97.9 F (36.6 C), temperature source Oral, resp. rate 19, height 5' 2"  (1.575 m), weight 72.1 kg (159 lb 0.8 oz), SpO2 97 %.  Results for orders placed or performed during the hospital encounter of 10/27/17 (from the past 72 hour(s))  CBC with Differential/Platelet     Status: Abnormal   Collection Time: 11/06/17  8:33 AM  Result Value Ref Range   WBC 14.5 (H) 4.0 - 10.5 K/uL   RBC 3.59 (L) 3.87 - 5.11 MIL/uL   Hemoglobin 11.5 (L) 12.0 - 15.0 g/dL   HCT 35.6 (L) 36.0 - 46.0 %   MCV 99.2 78.0 - 100.0 fL   MCH 32.0 26.0 - 34.0 pg   MCHC 32.3 30.0 - 36.0 g/dL   RDW 17.5 (H) 11.5 - 15.5 %   Platelets 363 150 - 400 K/uL   Neutrophils Relative % 72 %   Neutro Abs 10.4 (H) 1.7 - 7.7 K/uL   Lymphocytes Relative 23 %   Lymphs Abs 3.3 0.7 - 4.0 K/uL   Monocytes Relative 5 %   Monocytes Absolute 0.8 0.1 - 1.0 K/uL   Eosinophils Relative 0 %   Eosinophils Absolute 0.1 0.0 - 0.7 K/uL   Basophils Relative 0 %   Basophils Absolute 0.0 0.0 - 0.1 K/uL    Comment: Performed at [facility], [address], [place], [place] [address]     HEENT: Lurline Idol site with dressing C/D/I Cardio: RR/tachy without murmur. No JVD  Resp: CTA Bilaterally without wheezes or rales. Normal effort  GI: BS positive and nondistended Musculoskeletal:  No Edema, no tenderness in extremities Skin:   Intact. Warm and dry. Neuro: Alert/Oriented,  Motor: Bilateral upper extremities 4/5 proximal to distal Bilateral lower extremity is: 4-/5 hip flexion, knee extension, 2 -/5 ankle dorsi/plantar flexion (stable) Mild ankle edema bilateral , no foot or toe erythema no lesions, no synovitis, + tenderness to light palpation over the entire R foot General no acute distress.  Vital signs reviewed.   Assessment/Plan: 1. 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 Physiatrist is providing close team supervision and 24 hour management of active medical problems listed below. Physiatrist and rehab team continue to assess barriers to discharge/monitor patient progress toward functional and medical goals. FIM: Function - Bathing Bathing activity did not occur: Refused Position: Wheelchair/chair at sink Body parts bathed by patient: Right arm, Left arm, Chest, Abdomen, Right upper leg, Left upper leg, Right lower leg, Left lower leg, Front perineal area, Buttocks, Back Body parts bathed by helper: Back Bathing not applicable: Right lower leg, Left lower leg, Right upper leg, Left upper leg, Front perineal area, Buttocks(Declined LB) Assist Level: Touching or steadying assistance(Pt > 75%)  Function- Upper Body Dressing/Undressing Upper body dressing/undressing activity did not occur: Refused What is the patient wearing?: Pull over shirt/dress Pull over shirt/dress - Perfomed by patient: Thread/unthread right sleeve, Thread/unthread left sleeve, Put head through opening, Pull shirt over trunk Assist Level: Supervision or verbal cues Set up : To obtain clothing/put away Function - Lower Body Dressing/Undressing What is the patient wearing?: Underwear, Pants, Non-skid slipper socks Position: Wheelchair/chair at sink Underwear - Performed by patient: Thread/unthread  right underwear leg, Thread/unthread left underwear leg, Pull underwear up/down Pants- Performed by patient: Thread/unthread right pants leg, Thread/unthread left pants leg, Pull pants up/down Pants- Performed by helper: Pull pants up/down Non-skid slipper socks- Performed by patient: Don/doff right sock, Don/doff left sock Non-skid slipper socks- Performed by helper: Don/doff  right sock, Don/doff left sock(don) Assist for footwear: Supervision/touching assist Assist for lower body dressing: Supervision or verbal cues  Function - Toileting Toileting activity did not occur: No continent bowel/bladder event Toileting steps completed by patient: Performs perineal hygiene Toileting steps completed by helper: Adjust clothing prior to toileting, Adjust clothing after toileting Toileting Assistive Devices: Grab bar or rail Assist level: Touching or steadying assistance (Pt.75%)  Function - Air cabin crew transfer assistive device: Grab bar Assist level to toilet: Touching or steadying assistance (Pt > 75%) Assist level from toilet: Touching or steadying assistance (Pt > 75%) Assist level to bedside commode (at bedside): Touching or steadying assistance (Pt > 75%) Assist level from bedside commode (at bedside): Touching or steadying assistance (Pt > 75%)  Function - Chair/bed transfer Chair/bed transfer method: Stand pivot Chair/bed transfer assist level: Touching or steadying assistance (Pt > 75%) Chair/bed transfer assistive device: Armrests, Walker Chair/bed transfer details: Verbal cues for technique, Manual facilitation for placement, Manual facilitation for weight shifting, Verbal cues for precautions/safety  Function - Locomotion: Wheelchair Will patient use wheelchair at discharge?: (TBD) Type: Manual Max wheelchair distance: 126f Assist Level: Supervision or verbal cues Assist Level: Supervision or verbal cues Wheel 150 feet activity did not occur: Safety/medical concerns Assist Level: Supervision or verbal cues Turns around,maneuvers to table,bed, and toilet,negotiates 3% grade,maneuvers on rugs and over doorsills: No Function - Locomotion: Ambulation Ambulation activity did not occur: Safety/medical concerns Assistive device: Walker-rolling Max distance: 289fAssist level: Touching or steadying assistance (Pt > 75%) Walk 10 feet activity  did not occur: Safety/medical concerns Assist level: Touching or steadying assistance (Pt > 75%) Walk 50 feet with 2 turns activity did not occur: Safety/medical concerns Walk 150 feet activity did not occur: Safety/medical concerns Walk 10 feet on uneven surfaces activity did not occur: Safety/medical concerns  Function - Comprehension Comprehension: Auditory Comprehension assist level: Follows complex conversation/direction with extra time/assistive device  Function - Expression Expression: Verbal Expression assist level: Expresses complex ideas: With extra time/assistive device  Function - Social Interaction Social Interaction assist level: Interacts appropriately with others with medication or extra time (anti-anxiety, antidepressant).  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 Formulized individual treatment plan based on medical history, underlying problem and comorbidities. -PRAFO's for bilateral ankles    -reviewed potential nerve recovery as it pertains to her foot drop. 2. DVT Prophylaxis/Anticoagulation: Subcutaneous heparin.   Small isolated asymptomatic left peroneal  DVT, stable on repeat study on 2/8, plan for follow-up later this week 3. Pain Management:Hydrocodone  as needed. -schedule tylenol bid -Neuropathic foot pain start gabapentin 10022mID 2/13 4. Mood:Ativan 0.5 mg every 4  hours as needed.Provide emotional support 5. Neuropsych: This patientiscapable of making decisions on herown behalf. 6. Skin/Wound Care:Routine skin checks 7. Fluids/Electrolytes/Nutrition:Routine I&O's 8.Severe COPD with tobacco abuse. Continue nebulizers. Provide counseling regarding cessation of nicotine products.NicoDerm patch as well as prednisone taper, No SOB 9.Right-sided pneumothorax. Chest tube removed 10/13/2017, no shortness of breath 10. S/p Trach: decannulated. Continue compressive dressing daily, some air leakage, as d/w pt this will , 11.  Leukocytosis likely steroid related, afebrile   WBCs 14.2 on 2/4 , should normalize off oral steroids   Labs pending for today   Continue to monitor  12.  Vaginal itching some improvement no rash in peri area, has received Diflucan x 1 which is usual dose for vaginal candidiasis  13. Acute blood loss anemia   Hemoglobin 11.5 on 2/11   Continue to monitor 14. Hypoalbuminemia   Supplement initiated on 2/9  15. Hypertension   Continue Cardizem    Vitals:   11/08/17 0035 11/08/17 0619  BP: (!) 142/62 131/64  Pulse: 96   Resp: 19   Temp: 97.9 F (36.6 C)   SpO2: 97%   controlled 2/13  LOS (Days) 12 A FACE TO FACE EVALUATION WAS PERFORMED  Charlett Blake 11/08/2017, 6:47 AM

## 2017-11-08 NOTE — Plan of Care (Signed)
  Progressing Consults RH GENERAL PATIENT EDUCATION Description See Patient Education module for education specifics. 11/08/2017 0356 - Progressing by Marigene Ehlers, RN RH BOWEL ELIMINATION RH STG MANAGE BOWEL WITH ASSISTANCE Description STG Manage Bowel with min Assistance.  11/08/2017 0356 - Progressing by Marigene Ehlers, RN RH BLADDER ELIMINATION RH STG MANAGE BLADDER WITH ASSISTANCE Description STG Manage Bladder With min Assistance  11/08/2017 0356 - Progressing by Marigene Ehlers, RN RH SKIN INTEGRITY RH STG SKIN FREE OF INFECTION/BREAKDOWN Description While on rehab  11/08/2017 0356 - Progressing by Marigene Ehlers, RN   Not Progressing RH PAIN MANAGEMENT RH STG PAIN MANAGED AT OR BELOW PT'S PAIN GOAL Description Less than 3 out of 10  11/08/2017 0356 - Not Progressing by Marigene Ehlers, RN

## 2017-11-08 NOTE — Progress Notes (Signed)
Physical Therapy Session Note  Patient Details  Name: Sally [name] MRN: [id number] Date of Birth: 08-08-1951  Today's Date: 11/08/2017 PT Individual Time: 1400-1515 PT Individual Time Calculation (min): 75 min   Short Term Goals: Week 2:  PT Short Term Goal 1 (Week 2): STG =LTG due to ELOS  Skilled Therapeutic Interventions/Progress Updates:   Pt received supine in bed and agreeable to PT. Supine>sit transfer with supervision assist. Stand pivot transfer to [facility] with min assist for safety and min cues for proper UE placement.   WC mobility x 172f to day room with supervision assist and min cues for safety in turns. Standing tolerance to play Wii bowling x 15 mintues prior to requesting seated break. Supervision assist with 1-2 UE support on RW. Gait training with RW x 340fwith min assist and min cues for increased step height due to Bil foot drop. PT instructed pt in initiation of stair training. Ascend/descend with min assist x 2 steps (6") ; moderate cues for gait pattern and UE placement.  [facility] throughout treatment with min cues for proper anterior weight shift as well as improved UE placement.   [facility] returned to [facility] and [facility] and all needs met.          Therapy Documentation Precautions:  Precautions Precautions: Fall Restrictions Weight Bearing Restrictions: No    Pain: 0/10   See Function Navigator for Current Functional Status.   Therapy/Group: Individual Therapy  AuLorie Phenix/13/2019, 3:17 PM

## 2017-11-08 NOTE — Consult Note (Signed)
Neuropsychological Consultation   Patient:   Sally [name]   DOB:   01-18-1951  MR Number:  [id number]  Location:  [facility] [id number] [place] [address] Dept: [phone] Loc: [phone]           Date of Service:   11/08/2017  Start Time:   11 AM End Time:   12 PM  Provider/Observer:  Arley Phenix, Psy.D.       Clinical Neuropsychologist       Billing Code/Service: [phone] 4 Units  Chief Complaint:    Sally [name] is a 67 year old female with history of hypertension, severe COPD with tobacco abuse.  The patient presented on 09/26/2017 with increasing shortness of breath and cough.  She failed BiPAP and required intubation.  Elevated white blood cell count and was placed on broad-spectrum antibiotics.  Prolonged intubation requiring trach tube on 10/10/2017.  Pneumothorax requiring chest tube was removed on 10/13/2017.  Due to extended illness and hospital stay the patient was referred for physical/occupational therapy and admitted for comprehensive rehabilitation program.  The patient had been noted to be teary and upset about her overall health function and the extended hospital stay.  The patient is having some adjustment difficulties but as her discharge date and improvement overall medically has developed the patient's mood has improved.  Reason for Service:  Sally [name] was referred for neuropsychological/psychological consultation due to adjustment and adaptive concerns and issues.  Below is the HPI for the current admission.  UVO:ZDGUY Trujillois a 67 y.o.right handedfemalewith history of hypertension, severe COPD with tobacco abuse. Per chart review patient lives with spouse. Husband can assist as needed as well as daughter. Reported to be independent prior to admission. Presented 09/26/2017 with increasing shortness of breath and cough she failed BiPAP and required  intubation.WBC 21,900.Placed on broad-spectrum antibiotics.hospital course prolonged intubation requiring tracheostomy tube 10/10/2017 downsize to a #4 cuffless anddecannulated02/09/2017.Pneumothorax requiring chest tube that was removed 10/13/2017. Currently on a regular diet.Completed a 5 day course of Zosyn for right lower lobe infiltrate.Subcutaneous heparin for DVT prophylaxis.Fitted with bilateral PRAFO boots.Acute on chronic anemia 10.9 and monitored. Physical occupational therapy evaluations completed 10/14/2017 with recommendations of physical medicine rehabilitation consult.Patient was admitted for a comprehensive rehabilitation program.  Current Status:  The patient did acknowledge today that she has been upset about her extended hospital stay and having difficulty being away from her home and her family along with her dog.  The patient reports that as she began to feel stronger and improve medically and her discharge date approach that her mood has improved.  Today, the patient reports that she was feeling much more upbeat with her plan discharge tomorrow.  The patient denied a prior history of significant depression or anxiety types of symptoms but did acknowledge that she dealt with some anxiety throughout her life and is always been "a worry wart."  However, no significant anxiety was noted today and the patient reports that there are times where it will come and go.  Behavioral Observation: Sally [name]  presents as a 67 y.o.-year-old Right Caucasian Female who appeared her stated age. her dress was Appropriate and she was Well Groomed and her manners were Appropriate to the situation.  her participation was indicative of Appropriate and Attentive behaviors.  There were not any physical disabilities noted.  she displayed an appropriate level of cooperation and motivation.     Interactions:    Active Appropriate and  Attentive  Attention:   within normal limits and attention span and  concentration were age appropriate  Memory:   within normal limits; recent and remote memory intact  Visuo-spatial:  not examined  Speech (Volume):  normal  Speech:   normal; normal  Thought Process:  Coherent and Relevant  Though Content:  WNL; not suicidal and not homicidal  Orientation:   person, place, time/date and situation  Judgment:   Good  Planning:   Good  Affect:    Anxious  Mood:    Anxious  Insight:   Good  Intelligence:   normal  Medical History:   Past Medical History:  Diagnosis Date  . Asthma   . Back pain   . COPD (chronic obstructive pulmonary disease) (HCC)   . Crohn disease (HCC)   . Hypertension        Psychiatric History:  While the patient denies any significant prior history of major depression/clinical depression she does acknowledge a history of some anxiety.  She reports that she has had some care in the past for anxiety and worry but is never experienced severe disruptive anxiety in the past.  Family Med/Psych History:  Family History  Problem Relation Age of Onset  . Hypertension Other     Risk of Suicide/Violence: low patient denies any suicidal or homicidal ideation.  Impression/DX:  Sally [name] is a 67 year old female with history of hypertension, severe COPD with tobacco abuse.  The patient presented on 09/26/2017 with increasing shortness of breath and cough.  She failed BiPAP and required intubation.  Elevated white blood cell count and was placed on broad-spectrum antibiotics.  Prolonged intubation requiring trach tube on 10/10/2017.  Pneumothorax requiring chest tube was removed on 10/13/2017.  Due to extended illness and hospital stay the patient was referred for physical/occupational therapy and admitted for comprehensive rehabilitation program.  The patient had been noted to be teary and upset about her overall health function and the extended hospital stay.  The patient is having some adjustment difficulties but as her discharge  date and improvement overall medically has developed the patient's mood has improved.  The patient did acknowledge today that she has been upset about her extended hospital stay and having difficulty being away from her home and her family along with her dog.  The patient reports that as she began to feel stronger and improve medically and her discharge date approach that her mood has improved.  Today, the patient reports that she was feeling much more upbeat with her plan discharge tomorrow.  The patient denied a prior history of significant depression or anxiety types of symptoms but did acknowledge that she dealt with some anxiety throughout her life and is always been "a worry wart."  However, no significant anxiety was noted today and the patient reports that there are times where it will come and go.   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        Electronically Signed   _______________________ Arley Phenix, Psy.D.

## 2017-11-09 ENCOUNTER — Inpatient Hospital Stay (HOSPITAL_COMMUNITY): Payer: Medicare Other | Admitting: Occupational Therapy

## 2017-11-09 ENCOUNTER — Inpatient Hospital Stay (HOSPITAL_COMMUNITY): Payer: Medicare Other | Admitting: Physical Therapy

## 2017-11-09 DIAGNOSIS — F4321 Adjustment disorder with depressed mood: Secondary | ICD-10-CM

## 2017-11-09 DIAGNOSIS — J42 Unspecified chronic bronchitis: Secondary | ICD-10-CM

## 2017-11-09 NOTE — Progress Notes (Addendum)
Occupational Therapy Session Note  Patient Details  Name: Sally [name] MRN: [id number] Date of Birth: [birth year]  Today's Date: 11/09/2017 OT Individual Time: 4098-1191 and 1430-1500 OT Individual Time Calculation (min): 60 min and 30 min   Short Term Goals: Week 2:  OT Short Term Goal 1 (Week 2): Pt will complete stand pivot transfer with use of RW and min A OT Short Term Goal 2 (Week 2): Pt will stand at RW to complete clothing management during toileting or LB dressing with steadying assist OT Short Term Goal 3 (Week 2): Pt will complete 3/3 toileting tasks with steadying assist OT Short Term Goal 4 (Week 2): Pt will stand at sink to complete 1 grooming task in order to increase functional activity tolerance  Skilled Therapeutic Interventions/Progress Updates:   Session One: Pt seen for OT ADL bathing/dressing session> pt in supine upon arrival, agreeable to tx session. She ambulated throughout session with increased time for intiation due to anxiety with guarding assist. Today, pt able to navigate over bathroom threshold, a great milestone compared to previous session! She bathed seated on tub bench, standing with use of grab bars to complete pericare/ buttock hygiene.  Pt very anxious to walk out of bathroom and request w/c to exit bathroom. Completed stand pivot with close supervision using grab bars to w/c. Pt willing to attempt ambulation during PM session with this therapist She dressed seated in w/c, standing at EOB to pull pants up, requiring one UE support during standing tasks.  Following seated rest break, pt completed grooming tasks standing at sink, tolerating ~3-4 minutes of standing while completing oral care and placing in dentures.  After rest break. Pt completed x2 short distance ambulation trials, ambulating ~10ft, turning around and returning to w/c. Multimodal cuing provided for upright posture and to lessen UE reliance on UEs. Pt returned to w/c at en of session, proud  of her self for all that was accomplished during session however, voiced feeling very fatigued. Pt left with al needs in reach.   Session Two: Pt seen for OT session focusing on functional mobility with transfers and ADL re-training. Pt sitting up in w/c upon arrival, voices fatigue from previous tx sessions but willing to continue and voicing need for otileting task. B shoes/AFO donned total A for time management. Pt ambulated with close supervision into bathroom, managing over bathroom threshold with slightly increased time, but no anxiety problems. She completed clothing management prior to toileting task with supervision, intermittently able to let go of RW and pull down pants with B hands demonstrating mouch improved dynamic standing balance and confidence during standing tasks.  She required mod A to stand from standard toilet with use of grab bars and required encouragement to cont with ambulation out of bathroom following difficulty standing from toilet. With time, she ambulated to sink and completed hand hygiene in standing with CGA. Following seated rest break, pt completed stand pivot to EOB and returned to supine with min A. Encouraged pt to get OOB with nursing assist to eat dinner this evening, pt voiced understanding. Pt left in supine at end of session, bed alarm on and all needs in reach.  She voiced fatigue as side effects from medications, she did appear more mellow this session compared to previous sessions, however, she does not voice feeling as if being over medicated.   Therapy Documentation Precautions:  Precautions Precautions: Fall Restrictions Weight Bearing Restrictions: No Pain:   No/ denies pain  See Function Navigator for Current Functional  Status.   Therapy/Group: Individual Therapy  Eleonor Ocon L 11/09/2017, 7:19 AM

## 2017-11-09 NOTE — Progress Notes (Signed)
Occupational Therapy Session Note  Patient Details  Name: Sally [name] MRN: [id number] Date of Birth: November 17, 1950  Today's Date: 11/09/2017 OT Individual Time: 1115-1200 OT Individual Time Calculation (min): 45 min    Short Term Goals: Week 2:  OT Short Term Goal 1 (Week 2): Pt will complete stand pivot transfer with use of RW and min A OT Short Term Goal 2 (Week 2): Pt will stand at RW to complete clothing management during toileting or LB dressing with steadying assist OT Short Term Goal 3 (Week 2): Pt will complete 3/3 toileting tasks with steadying assist OT Short Term Goal 4 (Week 2): Pt will stand at sink to complete 1 grooming task in order to increase functional activity tolerance  Skilled Therapeutic Interventions/Progress Updates:    Pt presents sitting up in w/c ready for OT tx session, reports some pain in back, subsides/managed with rest breaks. Pt propels w/c to all therapy destinations modI for increased UB strengthening/endurance. In dayroom Pt engaging in CIR holiday activity seated at table to make valentine's day card; Pt then propels to therapy gym where she participated in standing activity building 25 piece puzzle for increased standing/activity tolerance. Pt standing and completing puzzle with min verbal cues for puzzle completion as well as to maintain upright posture; Pt utilizing single UE/forearm support during task completion with overall minGuard for static balance, standing x10 minutes prior to taking seated rest break. Pt propels back to room, ambulates from doorway to bedside with overall MinGuard using RW. Pt left seated in w/c end of session, family present, needs within reach.   Therapy Documentation Precautions:  Precautions Precautions: Fall Restrictions Weight Bearing Restrictions: No        See Function Navigator for Current Functional Status.   Therapy/Group: Individual Therapy  Orlando Penner 11/09/2017, 12:38 PM

## 2017-11-09 NOTE — Progress Notes (Addendum)
Social Work Patient ID: Sally [name], female   DOB: Mar 15, 1951, 67 y.o.   MRN: [id number]   CSW met with pt on 11-08-17 to update her on team conference discussion and to let her know that team kept the same targeted d/c date of 11-17-17.  Pt was pleased.  She would like for CSW, pt's husband, and her to meet together to discuss d/c needs.  CSW met with pt, husband, and son today to answer questions about DME and f/u therapies. Pt prefers to start with [facility] and progress to outpt therapy.  She has a walker at home that she will bring in to see if it can be used.  Pt feels she will need w/c in the community and CSW will address this with PT.  They appreciated visit.  CSW will begin making preparations for next week's d/c.  CSW will continue to follow and assist as needed.

## 2017-11-09 NOTE — Progress Notes (Signed)
Subjective/Complaints:  No issues overnite , foot pain is decreasing  ROS: pt denies nausea, vomiting, diarrhea, cough, shortness of breath or chest pain   Objective: Vital Signs: Blood pressure (!) 130/57, pulse 87, temperature 97.9 F (36.6 C), temperature source Oral, resp. rate 16, height 5\' 2"  (1.575 m), weight 75.9 kg (167 lb 5.3 oz), SpO2 95 %.  Results for orders placed or performed during the hospital encounter of 10/27/17 (from the past 72 hour(s))  CBC with Differential/Platelet     Status: Abnormal   Collection Time: 11/06/17  8:33 AM  Result Value Ref Range   WBC 14.5 (H) 4.0 - 10.5 K/uL   RBC 3.59 (L) 3.87 - 5.11 MIL/uL   Hemoglobin 11.5 (L) 12.0 - 15.0 g/dL   HCT 16.1 (L) 09.6 - 04.5 %   MCV 99.2 78.0 - 100.0 fL   MCH 32.0 26.0 - 34.0 pg   MCHC 32.3 30.0 - 36.0 g/dL   RDW 40.9 (H) 81.1 - 91.4 %   Platelets 363 150 - 400 K/uL   Neutrophils Relative % 72 %   Neutro Abs 10.4 (H) 1.7 - 7.7 K/uL   Lymphocytes Relative 23 %   Lymphs Abs 3.3 0.7 - 4.0 K/uL   Monocytes Relative 5 %   Monocytes Absolute 0.8 0.1 - 1.0 K/uL   Eosinophils Relative 0 %   Eosinophils Absolute 0.1 0.0 - 0.7 K/uL   Basophils Relative 0 %   Basophils Absolute 0.0 0.0 - 0.1 K/uL    Comment: Performed at [facility], [address] [place], [place] [address]     HEENT: Janina Mayo site with dressing C/D/I Cardio: RR/tachy without murmur. No JVD  Resp: CTA Bilaterally without wheezes or rales. Normal effort  GI: BS positive and nondistended Musculoskeletal:  No Edema, no tenderness in extremities Skin:   Intact. Warm and dry. Neuro: Alert/Oriented,  Motor: Bilateral upper extremities 4/5 proximal to distal Bilateral lower extremity is: 4-/5 hip flexion, knee extension, 2 -/5 ankle dorsi/plantar flexion (stable) Mild ankle edema bilateral , no foot or toe erythema no lesions, no synovitis, + tenderness to light palpation over the entire R foot General no acute distress. Vital signs  reviewed.   Assessment/Plan: 1. 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 Physiatrist is providing close team supervision and 24 hour management of active medical problems listed below. Physiatrist and rehab team continue to assess barriers to discharge/monitor patient progress toward functional and medical goals. FIM: Function - Bathing Bathing activity did not occur: Refused Position: Shower Body parts bathed by patient: Right arm, Left arm, Chest, Abdomen, Right upper leg, Left upper leg, Right lower leg, Left lower leg, Front perineal area, Buttocks, Back Body parts bathed by helper: Back Bathing not applicable: Right lower leg, Left lower leg, Right upper leg, Left upper leg, Front perineal area, Buttocks(Declined LB) Assist Level: Supervision or verbal cues  Function- Upper Body Dressing/Undressing Upper body dressing/undressing activity did not occur: Refused What is the patient wearing?: Pull over shirt/dress Pull over shirt/dress - Perfomed by patient: Thread/unthread right sleeve, Thread/unthread left sleeve, Put head through opening, Pull shirt over trunk Assist Level: Set up Set up : To obtain clothing/put away Function - Lower Body Dressing/Undressing What is the patient wearing?: Underwear, Pants, Non-skid slipper socks Position: Wheelchair/chair at sink Underwear - Performed by patient: Thread/unthread right underwear leg, Thread/unthread left underwear leg, Pull underwear up/down Pants-  Performed by patient: Thread/unthread right pants leg, Thread/unthread left pants leg, Pull pants up/down Pants- Performed by helper: Pull pants up/down Non-skid slipper socks- Performed by patient: Don/doff right sock, Don/doff left sock Non-skid slipper socks- Performed by helper: Don/doff right sock, Don/doff left sock(don) Assist for footwear:  Supervision/touching assist Assist for lower body dressing: Supervision or verbal cues  Function - Toileting Toileting activity did not occur: No continent bowel/bladder event Toileting steps completed by patient: Performs perineal hygiene, Adjust clothing prior to toileting, Adjust clothing after toileting Toileting steps completed by helper: Adjust clothing prior to toileting, Adjust clothing after toileting Toileting Assistive Devices: Grab bar or rail Assist level: Touching or steadying assistance (Pt.75%)  Function - Archivist transfer assistive device: Grab bar Assist level to toilet: Touching or steadying assistance (Pt > 75%) Assist level from toilet: Touching or steadying assistance (Pt > 75%) Assist level to bedside commode (at bedside): Touching or steadying assistance (Pt > 75%) Assist level from bedside commode (at bedside): Touching or steadying assistance (Pt > 75%)  Function - Chair/bed transfer Chair/bed transfer method: Stand pivot Chair/bed transfer assist level: Touching or steadying assistance (Pt > 75%) Chair/bed transfer assistive device: Walker, Armrests Chair/bed transfer details: Verbal cues for technique, Manual facilitation for placement, Manual facilitation for weight shifting, Verbal cues for precautions/safety  Function - Locomotion: Wheelchair Will patient use wheelchair at discharge?: (TBD) Type: Manual Max wheelchair distance: 170ft Assist Level: Supervision or verbal cues Assist Level: Supervision or verbal cues Wheel 150 feet activity did not occur: Safety/medical concerns Assist Level: Supervision or verbal cues Turns around,maneuvers to table,bed, and toilet,negotiates 3% grade,maneuvers on rugs and over doorsills: No Function - Locomotion: Ambulation Ambulation activity did not occur: Safety/medical concerns Assistive device: Walker-rolling Max distance: 32 Assist level: Touching or steadying assistance (Pt > 75%) Walk 10 feet  activity did not occur: Safety/medical concerns Assist level: Touching or steadying assistance (Pt > 75%) Walk 50 feet with 2 turns activity did not occur: Safety/medical concerns Walk 150 feet activity did not occur: Safety/medical concerns Walk 10 feet on uneven surfaces activity did not occur: Safety/medical concerns  Function - Comprehension Comprehension: Auditory Comprehension assist level: Follows complex conversation/direction with extra time/assistive device  Function - Expression Expression: Verbal Expression assist level: Expresses complex ideas: With extra time/assistive device  Function - Social Interaction Social Interaction assist level: Interacts appropriately with others with medication or extra time (anti-anxiety, antidepressant).  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 2. DVT Prophylaxis/Anticoagulation: Subcutaneous heparin.   Small isolated asymptomatic left peroneal  DVT, stable on repeat study on 2/8, plan for follow-up later this week 3. Pain Management:Hydrocodone as needed. -schedule tylenol bid -Neuropathic foot pain start gabapentin 100mg  TID 2/13, added cymbalta 20mg  on 2/13 4. Mood:Ativan 0.5 mg every 4 hours as needed.Provide emotional support 5. Neuropsych:  This patientiscapable of making decisions on herown behalf. 6. Skin/Wound Care:Routine skin checks 7. Fluids/Electrolytes/Nutrition:Routine I&O's 8.Severe COPD with tobacco  abuse. Continue nebulizers. Provide counseling regarding cessation of nicotine products.NicoDerm patch as well as prednisone taper, No SOB 9.Right-sided pneumothorax. Resolved    10. S/p Trach: decannulated. Continue compressive dressing daily, some air leakage, as d/w pt this will , 11.  Leukocytosis likely steroid related, afebrile, off po and on corticosteroid nebs   WBCs 14.5 on 2/11     12.  Vaginal itching some improvement no rash in peri area, has received Diflucan x 1 which is usual dose for vaginal candidiasis  13. Acute blood loss anemia   Hemoglobin 11.5 on 2/11   Continue to monitor 14. Hypoalbuminemia   Supplement initiated on 2/9  15. Hypertension   Continue Cardizem    Vitals:   11/08/17 2220 11/09/17 0700  BP: (!) 141/53 (!) 130/57  Pulse:  87  Resp:  16  Temp:  97.9 F (36.6 C)  SpO2:  95%  controlled 2/14  LOS (Days) 13 A FACE TO FACE EVALUATION WAS PERFORMED  Erick Colace 11/09/2017, 7:15 AM

## 2017-11-09 NOTE — Progress Notes (Signed)
Physical Therapy Session Note  Patient Details  Name: Lemma Tetro MRN: [id number] Date of Birth: 06-Sep-1951  Today's Date: 11/09/2017 PT Individual Time: 1005-1100 PT Individual Time Calculation (min): 55 min   Short Term Goals: Week 2:  PT Short Term Goal 1 (Week 2): STG =LTG due to ELOS  Skilled Therapeutic Interventions/Progress Updates:   Pt received sitting in WC and agreeable to PT  WC mobility instructed by PT with supervision assist x 134f to rehab gym. Min cues for doorway management.    PT fit pt for PLS BAFO with new shoes. Gait training instructed by PT with RW x 15 ft with min assist. Orthotist present for rest of treatment. Addition gait training with RW and AFO donned x 317fand 1514fith supervision assist; No AFO x [id number] min assist from PT. Pt noted to have no toe drag with AFO donned and significant toe drag BLE without AFO. Pt required prolinged rest break following each bout of gait to prevent increased anxiety.   Sit<>stand with supervision assist throughout treatment with BUE support and RW.   Patient returned to room and [facility] and all needs met.        Therapy Documentation Precautions:  Precautions Precautions: Fall Restrictions Weight Bearing Restrictions: No    Vital Signs: Therapy Vitals Temp: 97.9 F (36.6 C) Temp Source: Oral Pulse Rate: (!) 108 Resp: 18 BP: (!) 130/57 Patient Position (if appropriate): Lying Oxygen Therapy SpO2: 98 % O2 Device: Not Delivered Pain:   denies  See Function Navigator for Current Functional Status.   Therapy/Group: Individual Therapy  AustLorie Phenix4/2019, 10:57 AM

## 2017-11-10 ENCOUNTER — Inpatient Hospital Stay (HOSPITAL_COMMUNITY): Payer: Medicare Other | Admitting: Physical Therapy

## 2017-11-10 ENCOUNTER — Inpatient Hospital Stay (HOSPITAL_COMMUNITY): Payer: Medicare Other | Admitting: Occupational Therapy

## 2017-11-10 NOTE — Progress Notes (Signed)
Occupational Therapy Weekly Progress Note  Patient Details  Name: Sally [name] MRN: [id number] Date of Birth: 06-Aug-1951  Beginning of progress report period: November 03, 2017 End of progress report period: November 10, 2017  Today's Date: 11/10/2017 OT Individual Time: 2683-4196 and 2229-7989 OT Individual Time Calculation (min): 75 min and 25 min   Patient has met 4 of 4 short term goals.  Pt is making excellent progress towards OT goals. She is completing basic ADLs at guarding- min A level with significantly increased time required due to anxiety and need for frequent rest breaks. Pt can require up to mod A and still intermittently uses w/c for short distance transportation either due to nerve pain or fatigue/ anxiety. Pt's goals have been upgraded to supervision overall due to progress made this reporting period.   Patient continues to demonstrate the following deficits:abnormal posture, muscle weakness (generalized) and impaired sensation and therefore will continue to benefit from skilled OT intervention to enhance overall performance with BADL and Reduce care partner burden.  Patient progressing toward long term goals..  Plan of care revisions: Goals upgraded during progress period to supervision overall due to pt progress. See POC for goal details. .  OT Short Term Goals Week 2:  OT Short Term Goal 1 (Week 2): Pt will complete stand pivot transfer with use of RW and min A OT Short Term Goal 1 - Progress (Week 2): Met OT Short Term Goal 2 (Week 2): Pt will stand at RW to complete clothing management during toileting or LB dressing with steadying assist OT Short Term Goal 2 - Progress (Week 2): Met OT Short Term Goal 3 (Week 2): Pt will complete 3/3 toileting tasks with steadying assist OT Short Term Goal 3 - Progress (Week 2): Met OT Short Term Goal 4 (Week 2): Pt will stand at sink to complete 1 grooming task in order to increase functional activity tolerance OT Short Term Goal  4 - Progress (Week 2): Met Week 3:  OT Short Term Goal 1 (Week 3): STG=LTG due to LOS  Skilled Therapeutic Interventions/Progress Updates:    Session One: Pt seen for OT ADL bathing/dressing session. Pt in supine upon arrival, cont with complaints of fatigue but agreeable to tx session. She transefered to EOB mod I using hospital bed functions. Upon standing from EOB with RW, pt with increase of pain in R LE, reporting "cramping/ grabbing" feeling in calf with burning in foot. She took several steps in place before returning to sitting position and stated she did not feel comfortable to walk. Difficult to assess if calf was warm to touch as pt had just transitioned from being under bed sheets. Pt agreeable to cont with therapy, using w/c to transport. She completed supervision stand pivot transfers to w/c and from w/c > tub bench with use of grab bars. She bathed sit <> stand in shower with reliance on grab bars for support.  Pt returned to w/c to dress, standing with steadying assist at RW to pull pants up,  Requiring one UE support on walker for balance. Extended seated rest breaks required throughout session with VCs for deep breathing techniques.  With significantly increased time and VCs for problem solving, pt able to don B AFOs.  She completed oral care standing at sink,  With guarding assist. Encouraging pt to use B UEs for set-up (in order to discourage one UE support for standing), she was able to maintain static standing balance without UE support, however, fatigued quickly and required  to return to having one UE support while she completed task.  Pt left seated in w/c at end of session, completing grooming tasks at sink with all needs in reach.  Education provided throughout session regarding purpose of PRAFO boots and B AFOs. Encouragement provided to wear these and functional implications explained.  Session Two: Pt seen for OT session focusing on functional transfers and standing balance/  endurance. Pt sitting up in w/c upon arrival with RN present administering meds. Pt voicing increased fatigue, but agreeable to tx session as able. She was taken to therapy day room total A in w/c for time/energy conservation. Pt completed table top activity from standing position, required to use B UEs simultaneously during activity in order to increase pt's confidence and endurance in standing without UE support. Completed x2 trials with pt tolerating only ~1 minute of static standing before quickly returning to w/c. Pt cont with complaints of R calf "throbbing" (RN already away) and is unable to stand any longer due to pain. Seated rest breaks provided btwn trials. She returned to room and requested toileting task. She refused any ambulation to/from bathroom. Completed min A stand pivot transfers to toilet with mod A required to stand from standard toilet. Pt returned to recliner at end of session, left with all needs in reach.   Therapy Documentation Precautions:  Precautions Precautions: Fall Restrictions Weight Bearing Restrictions: No Pain:   Reports pain in R calf, unable to locate origin of pain. Pt reportsioned and shower provided for pain relief/ distraction.   See Function Navigator for Current Functional Status.   Therapy/Group: Individual Therapy  Dewarren Ledbetter L 11/10/2017, 7:01 AM

## 2017-11-10 NOTE — Progress Notes (Signed)
Subjective/Complaints: Patient denies any breathing problems.  Right foot is still painful but overall better.  Has episodes where it bothers her first thing in the morning when she first starts moving it.  She was not limited in her physical therapy yesterday.  Her sleep has not been affected.  She did have some drowsiness during the day yesterday this occurred around 3:30 PM   ROS: pt denies nausea, vomiting, diarrhea, cough, shortness of breath or chest pain   Objective: Vital Signs: Blood pressure (!) 144/68, pulse 84, temperature 98.5 F (36.9 C), temperature source Oral, resp. rate 18, height 5\' 2"  (1.575 m), weight 75.7 kg (166 lb 14.2 oz), SpO2 97 %.  No results found for this or any previous visit (from the past 72 hour(s)).   HEENT: Trach site with dressing C/D/I Cardio: RR/tachy without murmur. No JVD  Resp: CTA Bilaterally without wheezes or rales. Normal effort  GI: BS positive and nondistended Musculoskeletal:  No Edema, no tenderness in extremities Skin:   Intact. Warm and dry. Neuro: Alert/Oriented,  Motor: Bilateral upper extremities 4/5 proximal to distal Bilateral lower extremity is: 4-/5 hip flexion, knee extension, 2 -/5 ankle dorsi/plantar flexion (stable) Mild ankle edema bilateral , no foot or toe erythema no lesions, no synovitis, + tenderness to light palpation over the entire R foot General no acute distress. Vital signs reviewed.   Assessment/Plan: 1. 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 Physiatrist is providing close team supervision and 24 hour management of active medical problems listed below. Physiatrist and rehab team continue to assess barriers to discharge/monitor patient progress toward functional and medical goals. FIM: Function - Bathing Bathing activity did not occur: Refused Position: Shower Body parts  bathed by patient: Right arm, Left arm, Chest, Abdomen, Right upper leg, Left upper leg, Right lower leg, Left lower leg, Front perineal area, Buttocks, Back Body parts bathed by helper: Back Bathing not applicable: Right lower leg, Left lower leg, Right upper leg, Left upper leg, Front perineal area, Buttocks(Declined LB) Assist Level: Supervision or verbal cues  Function- Upper Body Dressing/Undressing Upper body dressing/undressing activity did not occur: Refused What is the patient wearing?: Pull over shirt/dress Pull over shirt/dress - Perfomed by patient: Thread/unthread right sleeve, Thread/unthread left sleeve, Put head through opening, Pull shirt over trunk Assist Level: Set up Set up : To obtain clothing/put away Function - Lower Body Dressing/Undressing What is the patient wearing?: Pants, Non-skid slipper socks, Underwear Position: Wheelchair/chair at sink Underwear - Performed by patient: Thread/unthread right underwear leg, Thread/unthread left underwear leg, Pull underwear up/down Pants- Performed by patient: Thread/unthread right pants leg, Thread/unthread left pants leg, Pull pants up/down Pants- Performed by helper: Pull pants up/down Non-skid slipper socks- Performed by patient: Don/doff right sock, Don/doff left sock Non-skid slipper socks- Performed by helper: Don/doff right sock, Don/doff left sock(don) Assist for footwear: Supervision/touching assist Assist for lower body dressing: Touching or steadying assistance (Pt > 75%)  Function - Toileting Toileting activity did not occur: No continent bowel/bladder event Toileting steps completed by patient: Adjust clothing prior to toileting, Performs perineal hygiene, Adjust clothing after toileting Toileting steps completed by helper: Adjust clothing prior to toileting, Adjust clothing after toileting Toileting Assistive Devices: Grab bar or rail Assist level: Touching or steadying assistance (Pt.75%)  Function - Transport planner transfer assistive device: Grab bar, Walker Assist level to toilet: Supervision or verbal cues Assist level  from toilet: Moderate assist (Pt 50 - 74%/lift or lower) Assist level to bedside commode (at bedside): Touching or steadying assistance (Pt > 75%) Assist level from bedside commode (at bedside): Touching or steadying assistance (Pt > 75%)  Function - Chair/bed transfer Chair/bed transfer method: Stand pivot Chair/bed transfer assist level: Touching or steadying assistance (Pt > 75%) Chair/bed transfer assistive device: Walker, Armrests Chair/bed transfer details: Verbal cues for technique, Manual facilitation for placement, Manual facilitation for weight shifting, Verbal cues for precautions/safety  Function - Locomotion: Wheelchair Will patient use wheelchair at discharge?: (TBD) Type: Manual Max wheelchair distance: 173ft  Assist Level: Supervision or verbal cues Assist Level: Supervision or verbal cues Wheel 150 feet activity did not occur: Safety/medical concerns Assist Level: Supervision or verbal cues Turns around,maneuvers to table,bed, and toilet,negotiates 3% grade,maneuvers on rugs and over doorsills: No Function - Locomotion: Ambulation Ambulation activity did not occur: Safety/medical concerns Assistive device: Walker-rolling Max distance: 31ft  Assist level: Supervision or verbal cues Walk 10 feet activity did not occur: Safety/medical concerns Assist level: Supervision or verbal cues Walk 50 feet with 2 turns activity did not occur: Safety/medical concerns Walk 150 feet activity did not occur: Safety/medical concerns Walk 10 feet on uneven surfaces activity did not occur: Safety/medical concerns  Function - Comprehension Comprehension: Auditory Comprehension assist level: Follows complex conversation/direction with extra time/assistive device  Function - Expression Expression: Verbal Expression assist level: Expresses complex ideas: With  extra time/assistive device  Function - Social Interaction Social Interaction assist level: Interacts appropriately with others with medication or extra time (anti-anxiety, antidepressant).  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 2. DVT Prophylaxis/Anticoagulation: Subcutaneous heparin.   Small isolated asymptomatic left peroneal  DVT, stable on repeat study on 2/8, plan for follow-up Doppler this week 3. Pain Management:Hydrocodone as needed. -schedule tylenol bid -Neuropathic foot pain start gabapentin 100mg  TID 2/13, added cymbalta 20mg  on 2/13, I expect that this will continue to improve with time.  May need to titrate the Cymbalta dose. 4. Mood:Ativan 0.5 mg every 4 hours as needed.Provide emotional support 5. Neuropsych: This patientiscapable of making decisions on herown behalf. 6. Skin/Wound Care:Routine skin checks 7. Fluids/Electrolytes/Nutrition:Routine I&O's 8.Severe COPD with tobacco abuse. Continue nebulizers. Provide counseling regarding cessation of nicotine products.NicoDerm patch as well as prednisone taper, No SOB    10. S/p Trach: decannulated. Continue compressive dressing daily, some air leakage, as d/w pt this will , 11.  Leukocytosis likely steroid related, afebrile,  off po and on corticosteroid nebs   WBCs 14.5 on 2/11     12.  Vaginal itching some improvement no rash in peri area, has received Diflucan x 1 which is usual dose for vaginal candidiasis  13. Acute blood loss anemia   Hemoglobin 11.5 on 2/11   Continue to monitor 14. Hypoalbuminemia   Supplement initiated on 2/9  15. Hypertension   Continue Cardizem    Vitals:   11/09/17 2114 11/10/17 0420  BP: 140/64 (!) 144/68  Pulse:  84  Resp:  18  Temp:  98.5 F (36.9 C)  SpO2:  97%  controlled 2/15  LOS (Days) 14 A FACE TO FACE EVALUATION WAS PERFORMED  Erick Colace 11/10/2017, 6:51 AM

## 2017-11-10 NOTE — Progress Notes (Signed)
Physical Therapy Weekly Progress Note  Patient Details  Name: Sally [name] MRN: [id number] Date of Birth: Jun 21, 1951  Beginning of progress report period: November 04, 2017 End of progress report period: November 10, 2017  Today's Date: 11/10/2017 PT Individual Time: 1115-1200 AND 1500-1545 PT Individual Time Calculation (min): 45 min AND 49mn   Patient has met 1 of 1 short term goals.  Pt continues to make progress with long term goals. Currently functioning at min-supervision assist level for transfers, gait and WC mobility. Pt stay extended due to continued progress and potiential to reduce burden of care on family upon d/c.   Patient continues to demonstrate the following deficits muscle weakness and muscle paralysis, abnormal tone and unbalanced muscle activation, decreased safety awareness and decreased standing balance and decreased balance strategies and therefore will continue to benefit from skilled PT intervention to increase functional independence with mobility.  Patient progressing toward long term goals..  Continue plan of care.  PT Short Term Goals Week 3:  PT Short Term Goal 2 (Week 3): STG =LTG due to ELOS  Skilled Therapeutic Interventions/Progress Updates:   Pt received sitting in WC and agreeable to PT  WC mobility x 2087fwith supervision assist from PT with min cues from PT for technique to reduc energy expenditure. .   Gait training with RW ans BLE AFO 6222f 2 with supervision assist. Min cues for posture, step length, and pursed lip breathing to reduce anxiety.   PT instructed pt in stair training x 4 steps with BUE support and min-mod assist. Pt self selects to ascend leading with the LLE due to RLE pain.   Patient returned to room and [facility] and all needs met.     Session 2.   Pt received sitting in WC and agreeable to PT. Pt transfers to day room .   Nustep endurance training 5 min x 2 with prolonged rest break  due to fatigue between bouts. Pt reports Borg RPE 15/10 at end of nustep endurance exercise.   Standing tolerance to play wii bowling x 15 minutes with supervision assist from PT. Pt noted mild increase in foot pain at end of standing tolerance task.   Sit<>stand and stand pivot transfers completed x 4 thorughout treatment with RW and supervision assist .   Patient returned to room and [facility] reach and all needs met.         Therapy Documentation Precautions:  Precautions Precautions: Fall Restrictions Weight Bearing Restrictions: No    Vital Signs: Therapy Vitals Temp: 98.4 F (36.9 C) Temp Source: Oral Pulse Rate: 94 Resp: 18 BP: (!) 141/50 Patient Position (if appropriate): Lying Oxygen Therapy SpO2: 94 % O2 Device: Not Delivered Pain: Pain Assessment Pain Score: 4  BLE   See Function Navigator for Current Functional Status.  Therapy/Group: Individual Therapy  AusLorie Phenix15/2019, 5:18 PM

## 2017-11-11 ENCOUNTER — Inpatient Hospital Stay (HOSPITAL_COMMUNITY): Payer: Medicare Other | Admitting: Occupational Therapy

## 2017-11-11 ENCOUNTER — Inpatient Hospital Stay (HOSPITAL_COMMUNITY): Payer: Medicare Other | Admitting: Physical Therapy

## 2017-11-11 NOTE — Progress Notes (Signed)
Occupational Therapy Session Note  Patient Details  Name: Sally [name] MRN: [id number] Date of Birth: 01-Oct-1950  Today's Date: 11/11/2017 OT Individual Time: 4332-9518 OT Individual Time Calculation (min): 44 min   Skilled Therapeutic Interventions/Progress Updates:    Pt greeted in recliner, sleepy, but agreeable to tx. She ambulated with RW and Min A into bathroom to transfer to toilet. Pt completing 3/3 components of toileting with steady assist. Mod A for power up from low toilet. She then ambulated into room and completed grooming tasks while standing at sink with unilateral support on surface. Next she self propelled to dayroom and worked on Patent attorney with standing balance via IADL participation. Pt washing large table with unilateral support. Cued pt to decrease reliance on supportive surface when sidestepping Lt>Rt with steady assist. Pt able to wash entire table without rest breaks. She also stood at [employer] with Min A to feel cool breeze for two minutes without device and Min A. Pt verbalizing pride with her progress thus far and motivated to continue making functional gains. Pt self propelled back to room, completed stand pivot<recliner with Min A. Pt repositioned for comfort and left with all needs within reach at session exit.   Therapy Documentation Precautions:  Precautions Precautions: Fall Restrictions Weight Bearing Restrictions: No Pain: No c/o pain during tx    ADL:       See Function Navigator for Current Functional Status.   Therapy/Group: Individual Therapy  Terril Amaro A Rindi Beechy 11/11/2017, 5:43 PM

## 2017-11-11 NOTE — Progress Notes (Signed)
Subjective/Complaints: Encouraged by progress. Happy that she's walking. Getting used to AFO's.   ROS: pt denies nausea, vomiting, diarrhea, cough, shortness of breath or chest pain      Objective: Vital Signs: Blood pressure (!) 137/56, pulse 84, temperature 98.1 F (36.7 C), temperature source Oral, resp. rate 18, height 5\' 2"  (1.575 m), weight 76 kg (167 lb 8.8 oz), SpO2 96 %.  No results found for this or any previous visit (from the past 72 hour(s)).   HEENT: Trach site with dressing C/D/I Cardio: RRR without murmur. No JVD   Resp: CTA Bilaterally without wheezes or rales. Normal effort  GI: BS positive and nondistended Musculoskeletal:  No Edema, no tenderness in extremities Skin:   Intact. Warm and dry. Neuro: Alert/Oriented,  Motor: Bilateral upper extremities 4/5 proximal to distal Bilateral lower extremity is: 4-/5 hip flexion, knee extension, 2 -/5 ankle dorsi/plantar flexion (unchanged) Mild ankle edema bilateral , no foot or toe erythema no lesions, no synovitis, + tenderness to light palpation over the entire R foot General no acute distress.     Assessment/Plan: 1. 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 Physiatrist is providing close team supervision and 24 hour management of active medical problems listed below. Physiatrist and rehab team continue to assess barriers to discharge/monitor patient progress toward functional and medical goals. FIM: Function - Bathing Bathing activity did not occur: Refused Position: Shower Body parts bathed by patient: Right arm, Left arm, Chest, Abdomen, Right upper leg, Left upper leg, Right lower leg, Left lower leg, Front perineal area, Buttocks, Back Body parts bathed by helper: Back Bathing not applicable: Right lower leg, Left lower leg, Right upper leg, Left upper leg, Front perineal  area, Buttocks(Declined LB) Assist Level: Touching or steadying assistance(Pt > 75%)  Function- Upper Body Dressing/Undressing Upper body dressing/undressing activity did not occur: Refused What is the patient wearing?: Pull over shirt/dress Pull over shirt/dress - Perfomed by patient: Thread/unthread right sleeve, Thread/unthread left sleeve, Put head through opening, Pull shirt over trunk Assist Level: Set up Set up : To obtain clothing/put away Function - Lower Body Dressing/Undressing What is the patient wearing?: Pants, Underwear, Socks, Shoes, AFO Position: Wheelchair/chair at sink Underwear - Performed by patient: Thread/unthread right underwear leg, Thread/unthread left underwear leg, Pull underwear up/down Pants- Performed by patient: Thread/unthread right pants leg, Thread/unthread left pants leg, Pull pants up/down Pants- Performed by helper: Pull pants up/down Non-skid slipper socks- Performed by patient: Don/doff right sock, Don/doff left sock Non-skid slipper socks- Performed by helper: Don/doff right sock, Don/doff left sock(don) Socks - Performed by patient: Don/doff right sock, Don/doff left sock Assist for footwear: Supervision/touching assist Assist for lower body dressing: Touching or steadying assistance (Pt > 75%)  Function - Toileting Toileting activity did not occur: No continent bowel/bladder event Toileting steps completed by patient: Performs perineal hygiene Toileting steps completed by helper: Adjust clothing prior to toileting, Adjust clothing after toileting Toileting Assistive Devices: Grab bar or rail Assist level: Touching or steadying assistance (Pt.75%)  Function - Archivist transfer assistive device: Grab bar, Walker Assist level to toilet: Supervision or verbal cues Assist level from toilet: Moderate assist (Pt 50 - 74%/lift or lower) Assist level to bedside commode (at bedside): Touching or steadying assistance (Pt > 75%) Assist  level from bedside commode (at bedside): Touching or steadying assistance (Pt > 75%)  Function - Chair/bed transfer Chair/bed transfer method:  Stand pivot Chair/bed transfer assist level: Touching or steadying assistance (Pt > 75%) Chair/bed transfer assistive device: Walker, Armrests Chair/bed transfer details: Verbal cues for technique, Manual facilitation for placement, Manual facilitation for weight shifting, Verbal cues for precautions/safety  Function - Locomotion: Wheelchair Will patient use wheelchair at discharge?: (TBD) Type: Manual Max wheelchair distance: 131ft  Assist Level: Supervision or verbal cues Assist Level: Supervision or verbal cues Wheel 150 feet activity did not occur: Safety/medical concerns Assist Level: Supervision or verbal cues Turns around,maneuvers to table,bed, and toilet,negotiates 3% grade,maneuvers on rugs and over doorsills: No Function - Locomotion: Ambulation Ambulation activity did not occur: Safety/medical concerns Assistive device: Walker-rolling Max distance: 14ft  Assist level: Supervision or verbal cues Walk 10 feet activity did not occur: Safety/medical concerns Assist level: Supervision or verbal cues Walk 50 feet with 2 turns activity did not occur: Safety/medical concerns Walk 150 feet activity did not occur: Safety/medical concerns Walk 10 feet on uneven surfaces activity did not occur: Safety/medical concerns  Function - Comprehension Comprehension: Auditory Comprehension assist level: Follows complex conversation/direction with extra time/assistive device  Function - Expression Expression: Verbal Expression assist level: Expresses complex ideas: With extra time/assistive device  Function - Social Interaction Social Interaction assist level: Interacts appropriately with others with medication or extra time (anti-anxiety, antidepressant).  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 2. DVT Prophylaxis/Anticoagulation: Subcutaneous heparin.   Small isolated asymptomatic left peroneal  DVT, stable on repeat study on 2/8, plan for follow-up Doppler this week 3. Pain Management:Hydrocodone as needed. -schedule tylenol bid -Neuropathic foot pain start gabapentin 100mg  TID 2/13, added cymbalta 20mg  on 2/13, I expect that this will continue to improve with time.  Consider titration of Cymbalta dose. 4. Mood:Ativan 0.5 mg every 4 hours as needed.Provide emotional support 5. Neuropsych: This patientiscapable of making decisions on herown behalf. 6. Skin/Wound Care:Routine skin checks 7. Fluids/Electrolytes/Nutrition:Routine I&O's 8.Severe COPD with tobacco abuse. Continue nebulizers. Provide counseling regarding cessation of nicotine products.NicoDerm patch as well as prednisone taper, No SOB    10. S/p Trach: decannulated , 11.  Leukocytosis likely steroid related, afebrile, off po and on corticosteroid nebs   WBCs 14.5 on 2/11     12.  Vaginal itching some improvement no rash in peri area, has received Diflucan x 1 which is usual dose for vaginal candidiasis  13. Acute blood loss anemia   Hemoglobin 11.5 on 2/11   Continue to monitor 14. Hypoalbuminemia   Supplement initiated on  2/9  15. Hypertension   Continue Cardizem    Vitals:   11/10/17 2101 11/11/17 0618  BP: (!) 136/55 (!) 137/56  Pulse: 82 84  Resp:  18  Temp:  98.1 F (36.7 C)  SpO2:  96%  controlled 2/16  LOS (Days) 15 A FACE TO FACE EVALUATION WAS PERFORMED  Jahkai Yandell T 11/11/2017, 1:04 PM

## 2017-11-11 NOTE — Progress Notes (Signed)
Physical Therapy Session Note  Patient Details  Name: Sally [name] MRN: [id number] Date of Birth: 12/06/1950  Today's Date: 11/11/2017 PT Individual Time: 0905-0950 PT Individual Time Calculation (min): 45 min   Short Term Goals: Week 2:  PT Short Term Goal 1 (Week 2): STG =LTG due to ELOS PT Short Term Goal 1 - Progress (Week 2): Progressing toward goal  Skilled Therapeutic Interventions/Progress Updates:   Pt received supine in bed and agreeable to PT. Supine>sit transfer with supervision assist  Stand pivot transfer to [facility] with RW and min assist. PT assisted pt to don AFO sitting in WC.   WC mobility to day room with supervision assist x 151ft and min cues for safety. Additional WC mobility x 27ft through simulated home environment and supervision assist from PT.   Gait training with RW 2x 68 ft with RW, BAFO and supervision assist from PT. Pt noted to have no foot drag on this day, and increased step length with cues from PT. pr  Sit<>stand Transfers training x 6 throughout treatment with with supervision assist. Min cues for proper UE placement as well as improved anterior weight shift to improve safety and success.   Pt reports need for urination. Toilet transfer with RW and supervision assist from PT; pt able to manage clothing with supervision assist from PT. Pt left sitting on toilet, RN aware.         Therapy Documentation Precautions:  Precautions Precautions: Fall Restrictions Weight Bearing Restrictions: No Pain: Pain Assessment Pain Score: 3    See Function Navigator for Current Functional Status.   Therapy/Group: Individual Therapy  Golden Pop 11/11/2017, 10:48 AM

## 2017-11-12 ENCOUNTER — Inpatient Hospital Stay (HOSPITAL_COMMUNITY): Payer: Medicare Other | Admitting: Occupational Therapy

## 2017-11-12 MED ORDER — GABAPENTIN 100 MG PO CAPS
200.0000 mg | ORAL_CAPSULE | Freq: Three times a day (TID) | ORAL | Status: DC
  Administered 2017-11-12 – 2017-11-17 (×15): 200 mg via ORAL
  Filled 2017-11-12 (×15): qty 2

## 2017-11-12 NOTE — Progress Notes (Signed)
Subjective/Complaints: Patient complains of pain in her feet again.  It is keeping her up at night still.  Otherwise happy with progress  ROS: pt denies nausea, vomiting, diarrhea, cough, shortness of breath or chest pain      Objective: Vital Signs: Blood pressure (!) 137/56, pulse 86, temperature 97.6 F (36.4 C), temperature source Oral, resp. rate 18, height 5\' 2"  (1.575 m), weight 71.3 kg (157 lb 3 oz), SpO2 94 %.  No results found for this or any previous visit (from the past 72 hour(s)).   HEENT: Neck clean trach site healed  Cardio: RRR without murmur. No JVD    Resp: CTA Bilaterally without wheezes or rales. Normal effort  GI: BS positive and nondistended Musculoskeletal:  No Edema, no tenderness in extremities Skin:   Intact. Warm and dry. Neuro: Alert/Oriented,  Motor: Bilateral upper extremities 4/5 proximal to distal Bilateral lower extremity is: 4-/5 hip flexion, knee extension, 2 -/5 ankle dorsi/plantar flexion (unchanged) Mild ankle edema bilateral , no foot or toe erythema no lesions, no synovitis, + tenderness to light palpation over the entire R foot General no acute distress.   Pleasant but anxiousPsych :   Assessment/Plan: 1. 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 Physiatrist is providing close team supervision and 24 hour management of active medical problems listed below. Physiatrist and rehab team continue to assess barriers to discharge/monitor patient progress toward functional and medical goals. FIM: Function - Bathing Bathing activity did not occur: Refused Position: Shower Body parts bathed by patient: Right arm, Left arm, Chest, Abdomen, Right upper leg, Left upper leg, Right lower leg, Left lower leg, Front perineal area, Buttocks, Back Body parts bathed by helper: Back Bathing not applicable: Right lower leg,  Left lower leg, Right upper leg, Left upper leg, Front perineal area, Buttocks(Declined LB) Assist Level: Touching or steadying assistance(Pt > 75%)  Function- Upper Body Dressing/Undressing Upper body dressing/undressing activity did not occur: Refused What is the patient wearing?: Pull over shirt/dress Pull over shirt/dress - Perfomed by patient: Thread/unthread right sleeve, Thread/unthread left sleeve, Put head through opening, Pull shirt over trunk Assist Level: Set up Set up : To obtain clothing/put away Function - Lower Body Dressing/Undressing What is the patient wearing?: Pants, Underwear, Socks, Shoes, AFO Position: Wheelchair/chair at sink Underwear - Performed by patient: Thread/unthread right underwear leg, Thread/unthread left underwear leg, Pull underwear up/down Pants- Performed by patient: Thread/unthread right pants leg, Thread/unthread left pants leg, Pull pants up/down Pants- Performed by helper: Pull pants up/down Non-skid slipper socks- Performed by patient: Don/doff right sock, Don/doff left sock Non-skid slipper socks- Performed by helper: Don/doff right sock, Don/doff left sock(don) Socks - Performed by patient: Don/doff right sock, Don/doff left sock Assist for footwear: Supervision/touching assist Assist for lower body dressing: Touching or steadying assistance (Pt > 75%)  Function - Toileting Toileting activity did not occur: No continent bowel/bladder event Toileting steps completed by patient: Adjust clothing prior to toileting, Performs perineal hygiene, Adjust clothing after toileting Toileting steps completed by helper: Adjust clothing prior to toileting, Adjust clothing after toileting Toileting Assistive Devices: Grab bar or rail Assist level: Touching or steadying assistance (Pt.75%)  Function - Archivist transfer assistive device: Grab bar, Walker Assist level to toilet: Touching or steadying assistance (Pt > 75%) Assist level from  toilet: Moderate assist (Pt 50 - 74%/lift or lower) Assist level to bedside commode (at bedside): Touching  or steadying assistance (Pt > 75%) Assist level from bedside commode (at bedside): Touching or steadying assistance (Pt > 75%)  Function - Chair/bed transfer Chair/bed transfer method: Stand pivot Chair/bed transfer assist level: Supervision or verbal cues Chair/bed transfer assistive device: Armrests, Walker, Orthosis Chair/bed transfer details: Verbal cues for technique, Manual facilitation for placement, Manual facilitation for weight shifting, Verbal cues for precautions/safety  Function - Locomotion: Wheelchair Will patient use wheelchair at discharge?: (TBD) Type: Manual Max wheelchair distance: 127ft  Assist Level: Supervision or verbal cues Assist Level: Supervision or verbal cues Wheel 150 feet activity did not occur: Safety/medical concerns Assist Level: Supervision or verbal cues Turns around,maneuvers to table,bed, and toilet,negotiates 3% grade,maneuvers on rugs and over doorsills: No Function - Locomotion: Ambulation Ambulation activity did not occur: Safety/medical concerns Assistive device: Walker-rolling Max distance: 15ft  Assist level: Supervision or verbal cues Walk 10 feet activity did not occur: Safety/medical concerns Assist level: Supervision or verbal cues Walk 50 feet with 2 turns activity did not occur: Safety/medical concerns Assist level: Supervision or verbal cues Walk 150 feet activity did not occur: Safety/medical concerns Walk 10 feet on uneven surfaces activity did not occur: Safety/medical concerns  Function - Comprehension Comprehension: Auditory Comprehension assist level: Follows complex conversation/direction with extra time/assistive device  Function - Expression Expression: Verbal Expression assist level: Expresses complex ideas: With extra time/assistive device  Function - Social Interaction Social Interaction assist level:  Interacts appropriately with others with medication or extra time (anti-anxiety, antidepressant).  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 DVT Prophylaxis/Anticoagulation: Subcutaneous heparin.   Small isolated asymptomatic left peroneal  DVT, stable on repeat study on 2/8, plan for follow-up Doppler this week 3. Pain Management:Hydrocodone as needed. -schedule tylenol bid -Neuropathic foot pain start gabapentin 100mg  TID 2/13, added cymbalta 20mg  on 2/13,    -Increase gabapentin to 200 mg 3 times daily today    -consider titration of Cymbalta dose also. 4. Mood:Ativan 0.5 mg every 4 hours as needed.Provide emotional support 5. Neuropsych: This patientiscapable of making decisions on herown behalf. 6. Skin/Wound Care:Routine skin checks 7. Fluids/Electrolytes/Nutrition:Routine I&O's 8.Severe COPD with tobacco abuse. Continue nebulizers. Provide counseling regarding cessation of nicotine products.NicoDerm patch as well as prednisone taper, No SOB    10. S/p Trach: decannulated , 11.  Leukocytosis likely steroid related, afebrile, off po and on corticosteroid nebs   WBCs 14.5 on 2/11     12.  Vaginal itching some improvement no rash in peri area, has received Diflucan x 1 which is usual dose for vaginal  candidiasis  13. Acute blood loss anemia   Hemoglobin 11.5 on 2/11   Continue to monitor 14. Hypoalbuminemia   Supplement initiated on 2/9  15. Hypertension   Continue Cardizem    Vitals:   11/12/17 0801 11/12/17 0802  BP:    Pulse:    Resp:    Temp:    SpO2: 94% 94%  controlled 2/17  LOS (  Days) 16 A FACE TO FACE EVALUATION WAS PERFORMED  Sally [name] T 11/12/2017, 9:41 AM

## 2017-11-12 NOTE — Progress Notes (Signed)
Occupational Therapy Session Note  Patient Details  Name: Sally [name] MRN: [id number] Date of Birth: December 01, 1950  Today's Date: 11/12/2017 OT Individual Time: 1245-1315 OT Individual Time Calculation (min): 30 min    Short Term Goals: Week 3:  OT Short Term Goal 1 (Week 3): STG=LTG due to LOS  Skilled Therapeutic Interventions/Progress Updates:    Pt seen for OT ADL bathing/dressing session. Pt sitting up in w/c upon arrival with daughter present assisting with cutting finger nails. Pt agreeable to tx session and desiring to complete bathing task at sink and complete toileting task. With encouragement, pt willing to try ambulation into bathroom. Required increased time and min A for ambulation into/ out of bathroom and VCs for RW management. SHe completed 3/3 toileting tasks with steadying assist. She ambulated out of bathroom to w/c. Completed UB bathing from w/c level at sink with assist for set-up. She stood at sink ledge with supervision, able to maintain dynamic standing balance without UE support while completing pericare/ buttock hygiene.  Pt left seated in w/c at end of session, all needs in reach.   Therapy Documentation Precautions:  Precautions Precautions: Fall Restrictions Weight Bearing Restrictions: No Pain: Pain Assessment Pain Assessment: 0-10 Pain Score: 6 Pain Type: Chronic pain Pain Location: Foot Pain Orientation: Right;Left Pain Radiating Towards: Right leg Pain Descriptors / Indicators: Aching;Cramping Pain Frequency: Intermittent Pain Onset: On-going Patients Stated Pain Goal: 2 Pain Intervention(s): Repositioned;Emotional support  See Function Navigator for Current Functional Status.   Therapy/Group: Individual Therapy  Jamicheal Heard L 11/12/2017, 6:50 AM

## 2017-11-13 ENCOUNTER — Inpatient Hospital Stay (HOSPITAL_COMMUNITY): Payer: Medicare Other | Admitting: Physical Therapy

## 2017-11-13 ENCOUNTER — Inpatient Hospital Stay (HOSPITAL_COMMUNITY): Payer: Medicare Other | Admitting: Occupational Therapy

## 2017-11-13 NOTE — Progress Notes (Signed)
Subjective/Complaints: Pain in R foot but able to sleep all night  ROS: pt denies nausea, vomiting, diarrhea, cough, shortness of breath or chest pain      Objective: Vital Signs: Blood pressure (!) 130/48, pulse 89, temperature 98.4 F (36.9 C), temperature source Oral, resp. rate 18, height 5\' 2"  (1.575 m), weight 71.5 kg (157 lb 8.3 oz), SpO2 93 %.  No results found for this or any previous visit (from the past 72 hour(s)).   HEENT: Neck clean trach site healed  Cardio: RRR without murmur. No JVD    Resp: CTA Bilaterally without wheezes or rales. Normal effort  GI: BS positive and nondistended Musculoskeletal:  No Edema, no tenderness in extremities Skin:   Intact. Warm and dry. Neuro: Alert/Oriented,  Motor: Bilateral upper extremities 4/5 proximal to distal Bilateral lower extremity is: 4-/5 hip flexion, knee extension, 2 -/5 ankle dorsi/plantar flexion (unchanged) Mild ankle edema bilateral , no foot or toe erythema no lesions, no synovitis, + tenderness to light palpation over the entire R foot General no acute distress.   Pleasant but anxiousPsych :   Assessment/Plan: 1. 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 Physiatrist is providing close team supervision and 24 hour management of active medical problems listed below. Physiatrist and rehab team continue to assess barriers to discharge/monitor patient progress toward functional and medical goals. FIM: Function - Bathing Bathing activity did not occur: Refused Position: Wheelchair/chair at sink Body parts bathed by patient: Right arm, Left arm, Chest, Abdomen, Right upper leg, Left upper leg, Right lower leg, Left lower leg, Front perineal area, Buttocks, Back Body parts bathed by helper: Back Bathing not applicable: Right lower leg, Left lower leg, Right upper leg, Left upper leg,  Front perineal area, Buttocks(Declined LB) Assist Level: Touching or steadying assistance(Pt > 75%)  Function- Upper Body Dressing/Undressing Upper body dressing/undressing activity did not occur: Refused What is the patient wearing?: Hospital gown Pull over shirt/dress - Perfomed by patient: Thread/unthread right sleeve, Thread/unthread left sleeve, Put head through opening, Pull shirt over trunk Assist Level: Set up Set up : To obtain clothing/put away Function - Lower Body Dressing/Undressing What is the patient wearing?: Hospital Gown, Non-skid slipper socks Position: Wheelchair/chair at sink Underwear - Performed by patient: Thread/unthread right underwear leg, Thread/unthread left underwear leg, Pull underwear up/down Pants- Performed by patient: Thread/unthread right pants leg, Thread/unthread left pants leg, Pull pants up/down Pants- Performed by helper: Pull pants up/down Non-skid slipper socks- Performed by patient: Don/doff right sock, Don/doff left sock Non-skid slipper socks- Performed by helper: Don/doff right sock, Don/doff left sock(don) Socks - Performed by patient: Don/doff right sock, Don/doff left sock Assist for footwear: Supervision/touching assist Assist for lower body dressing: Touching or steadying assistance (Pt > 75%)  Function - Toileting Toileting activity did not occur: No continent bowel/bladder event Toileting steps completed by patient: Adjust clothing prior to toileting, Performs perineal hygiene, Adjust clothing after toileting Toileting steps completed by helper: Adjust clothing prior to toileting, Adjust clothing after toileting Toileting Assistive Devices: Grab bar or rail Assist level: Touching or steadying assistance (Pt.75%)  Function - Archivist transfer assistive device: Grab bar, Walker Assist level to toilet: Touching or steadying assistance (Pt > 75%) Assist level from toilet: Touching or steadying assistance (Pt >  75%) Assist level to bedside commode (at bedside): Touching or steadying assistance (Pt > 75%) Assist level from bedside commode (at  bedside): Touching or steadying assistance (Pt > 75%)  Function - Chair/bed transfer Chair/bed transfer method: Stand pivot Chair/bed transfer assist level: Supervision or verbal cues Chair/bed transfer assistive device: Armrests, Walker, Orthosis Chair/bed transfer details: Verbal cues for technique, Manual facilitation for placement, Manual facilitation for weight shifting, Verbal cues for precautions/safety  Function - Locomotion: Wheelchair Will patient use wheelchair at discharge?: (TBD) Type: Manual Max wheelchair distance: 164ft  Assist Level: Supervision or verbal cues Assist Level: Supervision or verbal cues Wheel 150 feet activity did not occur: Safety/medical concerns Assist Level: Supervision or verbal cues Turns around,maneuvers to table,bed, and toilet,negotiates 3% grade,maneuvers on rugs and over doorsills: No Function - Locomotion: Ambulation Ambulation activity did not occur: Safety/medical concerns Assistive device: Walker-rolling Max distance: 59ft  Assist level: Supervision or verbal cues Walk 10 feet activity did not occur: Safety/medical concerns Assist level: Supervision or verbal cues Walk 50 feet with 2 turns activity did not occur: Safety/medical concerns Assist level: Supervision or verbal cues Walk 150 feet activity did not occur: Safety/medical concerns Walk 10 feet on uneven surfaces activity did not occur: Safety/medical concerns  Function - Comprehension Comprehension: Auditory Comprehension assist level: Follows complex conversation/direction with extra time/assistive device  Function - Expression Expression: Verbal Expression assist level: Expresses complex ideas: With extra time/assistive device  Function - Social Interaction Social Interaction assist level: Interacts appropriately with others with medication  or extra time (anti-anxiety, antidepressant).  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 DVT Prophylaxis/Anticoagulation: Subcutaneous heparin.   Small isolated asymptomatic left peroneal  DVT, stable on repeat study on 2/8, plan for follow-up Doppler, will order today 3. Pain Management:Hydrocodone as needed. -schedule tylenol bid -Neuropathic foot pain start gabapentin 100mg  TID 2/13, added cymbalta 20mg  on 2/13,    -Increase gabapentin to 200 mg 3 times daily 2/17, helping    -consider titration of Cymbalta dose also. 4. Mood:Ativan 0.5 mg every 4 hours as needed.Provide emotional support 5. Neuropsych: This patientiscapable of making decisions on herown behalf. 6. Skin/Wound Care:Routine skin checks 7. Fluids/Electrolytes/Nutrition:Routine I&O's 8.Severe COPD with tobacco abuse. Continue nebulizers. Provide counseling regarding cessation of nicotine products.NicoDerm patch as well as prednisone taper, No SOB    10. S/p Trach: decannulated , 11.  Leukocytosis likely steroid related, afebrile, off po and on corticosteroid nebs   WBCs 14.5 on 2/11     12.  Vaginal itching some improvement no rash in peri area, has received Diflucan x 1 which is usual dose for vaginal candidiasis  13. Acute blood loss anemia    Hemoglobin 11.5 on 2/11   Continue to monitor 14. Hypoalbuminemia   Supplement initiated on 2/9  15. Hypertension   Continue Cardizem    Vitals:   11/12/17 2056 11/13/17 0500  BP:  (!) 130/48  Pulse: 90 89  Resp: 17 18  Temp:  98.4 F (36.9 C)  SpO2: 93% 93%  controlled 2/18  LOS (Days) 17 A FACE TO FACE  EVALUATION WAS PERFORMED  Erick Colace 11/13/2017, 7:40 AM

## 2017-11-13 NOTE — Progress Notes (Signed)
Physical Therapy Session Note  Patient Details  Name: Sally [name] MRN: [id number] Date of Birth: [birth year]  Today's Date: 11/13/2017 PT Individual Time: 1105-1205 PT Individual Time Calculation (min): 60 min   Short Term Goals: Week 3:  PT Short Term Goal 2 (Week 3): STG =LTG due to ELOS  Skilled Therapeutic Interventions/Progress Updates:  Pt presented in recliner agreeable to therapy. Pt indicated currently pleased with progress. Session focused on LE strengthening and stair training. Pt transported to rehab gym for energy conservation. Attempted 6in step with single rail however pt unable to power up to clear step. Trialed 4in step ups in parallel bars. Pt able to perform forward x 8 bilaterally with seated rest and x 4 lateral step ups with each LE. Pt required frequent rest breaks due to fatigue and PTA provided verbal cues for foot placement and increasing quad activation. During therapeutic rest PTA provided pt edu regarding energy conservation strategies and strategies for improving endurance upon d/c. Pt verbalized understanding. Participated in gait training 75f with RW with x 1 brief standing rest. Pt noted to have consistent L foot clearance during ambulation with L AFO placed. Pt propelled remaining distance back to room and performed stand pivot to recliner. Pt left in chair at end of session with all current needs met.      Therapy Documentation Precautions:  Precautions Precautions: Fall Restrictions Weight Bearing Restrictions: No   See Function Navigator for Current Functional Status.   Therapy/Group: Individual Therapy  Sally Isip  [name], PTA  11/13/2017, 4:05 PM

## 2017-11-13 NOTE — Progress Notes (Signed)
Occupational Therapy Session Note  Patient Details  Name: Sally [name] MRN: [id number] Date of Birth: 09/16/1951  Today's Date: 11/13/2017 OT Individual Time: 7425-9563 OT Individual Time Calculation (min): 73 min    Short Term Goals: Week 3:  OT Short Term Goal 1 (Week 3): STG=LTG due to LOS  Skilled Therapeutic Interventions/Progress Updates:    Pt seen for OT ADL bathing/dressing session. Pt in supine upon arrival with RN present administering medications. Pt reports feeling well rested and ready for tx session and planned shower.  She ambulated throughout room with RW and min A with VCs for foot clearance as AFOs not donned. She required VCs for RW management in functional context of shower transfer. She bathed with distant supervision seated on tub bench, standing with use of grab bars to complete pericare/buttock hygiene.  She ambulated out of bathroom with CGA. She dressed seated in w/c, standing at RW with close supervision to pull pants up, demonstrating ability to maintain dynamic standing balance without UE support in order to manage clothing with B hands.. Pt able to don B shoes/AFOs with min A.  Following seated rest break, pt ambulated short distance to sink and completed grooming tasks standing at sink with support of RW. SHe was able to use B UEs to brush hair and complete oral care. Pt returned to recliner at end of session, left with all needs in reach.   Therapy Documentation Precautions:  Precautions Precautions: Fall Restrictions Weight Bearing Restrictions: No Pain: Pain Assessment Pain Assessment: No/denies pain   See Function Navigator for Current Functional Status.   Therapy/Group: Individual Therapy  Kelechi Astarita L 11/13/2017, 7:11 AM

## 2017-11-13 NOTE — Progress Notes (Signed)
Physical Therapy Session Note  Patient Details  Name: Sally [name] MRN: [id number] Date of Birth: 01/15/1951  Today's Date: 11/13/2017 PT Individual Time: 1510-1540 PT Individual Time Calculation (min): 30 min   Short Term Goals: Week 2:  PT Short Term Goal 1 (Week 2): STG =LTG due to ELOS PT Short Term Goal 1 - Progress (Week 2): Progressing toward goal  Skilled Therapeutic Interventions/Progress Updates:  Pt received in w/c & agreeable to tx. Pt propelled w/c room>gym with supervision. Gait x 100 ft with B AFO's and steady assist with decreased gait speed. Pt reports she has steps to enter her home with L rail only. Therapist provided instruction & demonstration for laterally negotiating stairs. Pt return demonstrated with BUE supported on L rail and min/mod assist; pt only able to negotiate 1 step before requiring a seated rest break 2/2 fatigue & R LE pain. Discussed home entry and pt reports she does not plan to have anyone bump her up steps in w/c. Therapist provided handout on how to build a ramp but pt's husband will have to follow up with their landlord. Pt reports she is able to negotiate 4 steps with B rails but only has the L rail at home therefore pt will need to practice this prior to d/c. Also provided energy conservation handouts & reviewed information with her. Pt then reporting nausea and requested to return to room. RN alerted to pt's c/o and pt left in w/c in care of RN. Pt missed 30 minutes of skilled PT treatment 2/2 nausea.   Therapy Documentation Precautions:  Precautions Precautions: Fall Restrictions Weight Bearing Restrictions: No   See Function Navigator for Current Functional Status.   Therapy/Group: Individual Therapy  Sandi Mariscal 11/13/2017, 4:03 PM

## 2017-11-14 ENCOUNTER — Inpatient Hospital Stay (HOSPITAL_COMMUNITY): Payer: Medicare Other

## 2017-11-14 ENCOUNTER — Encounter (HOSPITAL_COMMUNITY): Payer: Medicare Other | Admitting: Psychology

## 2017-11-14 ENCOUNTER — Inpatient Hospital Stay (HOSPITAL_COMMUNITY): Payer: Medicare Other | Admitting: Physical Therapy

## 2017-11-14 ENCOUNTER — Inpatient Hospital Stay (HOSPITAL_COMMUNITY): Payer: Medicare Other | Admitting: Occupational Therapy

## 2017-11-14 DIAGNOSIS — Z86718 Personal history of other venous thrombosis and embolism: Secondary | ICD-10-CM

## 2017-11-14 NOTE — Progress Notes (Signed)
Physical Therapy Session Note  Patient Details  Name: Sally [name] MRN: [id number] Date of Birth: 30-May-1951  Today's Date: 11/14/2017 PT Individual Time: 1405-1500 PT Individual Time Calculation (min): 55 min   Short Term Goals: Week 2:  PT Short Term Goal 1 (Week 2): STG =LTG due to ELOS PT Short Term Goal 1 - Progress (Week 2): Progressing toward goal  Skilled Therapeutic Interventions/Progress Updates: Pt presented in recliner agreeable to therapy. Pt performed squat pivot to w/c and propelled supervision to day room. Performed squat pivot to NuStep. nsg arrived to administer pain meds. Pt performed NuStep L1 x 5 min for endurance. Pt returned to w/c and transported to rehab gym. Pt instructed and participated in 3in steps with L rail only. Pt able to perform with increased time and minA. Pt required cues for hand placement to push on rail rather than attempt pulling self up with rail. After seated rest pt attempted 6in steps and able to perform with L rail only and stepping with LLE. Pt noted to have near knee buckling after second step. After therapeutic rest pt ambulated 174ft with RW and min guard with pt demonstrating good clearance of BLE with AFOs. Pt propelled remaining distance back to room and performed ambulatory transfer from sink to toilet. Pt left on toilet with verbal understanding to use call bell once completed and nsg notified.      Therapy Documentation Precautions:  Precautions Precautions: Fall Restrictions Weight Bearing Restrictions: No General:   Vital Signs: Therapy Vitals Pulse Rate: (!) 102 BP: (!) 160/62 Patient Position (if appropriate): Sitting Oxygen Therapy SpO2: 95 % O2 Device: Not Delivered  See Function Navigator for Current Functional Status.   Therapy/Group: Individual Therapy  Marin Milley  Etrulia Zarr, PTA   11/14/2017, 4:31 PM

## 2017-11-14 NOTE — Plan of Care (Signed)
  RH SKIN INTEGRITY RH STG SKIN FREE OF INFECTION/BREAKDOWN Description While on rehab  11/14/2017 1155 - Progressing by Thurston Hole, RN   RH PAIN MANAGEMENT RH STG PAIN MANAGED AT OR BELOW PT'S PAIN GOAL Description Less than 3 out of 10  11/14/2017 1155 - Progressing by Thurston Hole, RN   RH KNOWLEDGE DEFICIT GENERAL RH STG INCREASE KNOWLEDGE OF SELF CARE AFTER HOSPITALIZATION Description Pt will demonstrate increased knowledge of self care at discharge with complete independence  11/14/2017 1155 - Progressing by Thurston Hole, RN

## 2017-11-14 NOTE — Progress Notes (Signed)
Left lower extremity venous duplex completed. Remains positive for a DVT in the mid peroneal vein with no propagation proximally. There is still no evidence of a superficial thrombosis or Baker's cyst. Toma Deiters, RVS 11/14/2017, 12:08 PM

## 2017-11-14 NOTE — Progress Notes (Signed)
Patient transported via Bed to IR for Vascular US Lower Extremity. Patient alert and oriented x 4 to person, place, time, and situation. No s/s distress noted.

## 2017-11-14 NOTE — Progress Notes (Signed)
Patient returned to unit via Bed. Patient arouses easily to verbal stimuli. Patient is currently resting quietly with eyes closed. Respirations even and non-labored. No s/s distress noted.

## 2017-11-14 NOTE — Progress Notes (Signed)
Occupational Therapy Session Note  Patient Details  Name: Sally [name] MRN: [id number] Date of Birth: [birth year]  Today's Date: 11/14/2017 OT Individual Time: 3016-0109 OT Individual Time Calculation (min): 75 min    Short Term Goals: Week 3:  OT Short Term Goal 1 (Week 3): STG=LTG due to LOS  Skilled Therapeutic Interventions/Progress Updates:    Pt seen for OT ADL bathing/dressing session. Pt asleep in supine upon arrival, easily awoken and agreeable to tx session. She transferred to EOB mod I. Upon standing from EOB with RW, she voiced increased discomfort in R calf, described pain as a "stretch" vs. A burning sensation in LE. Pt then declined ambulation into bathroom due to R LE discomfort. She completed stand pivot transfer from w/c to shower bench with supervision using grab bars. She bathed seated on tub bench, standing with use of grab bars to complete pericare/buttock hygiene.  She returned to w/c to dress, using step stool to assist when donning B shoes/AFOs.  She stood at [facility] to pull pants up, maintaining dynamic balance with supervision. She stood at sink to complete grooming tasks, tolerating ~3-4 minutes in standing and using B UEs to fix hair and maintaining dynamic standing balance.  Pt provided with theraband and education/demonstration provided for self calf-stretching using theraband. Pt unable to tolerate clearing of toes from from floor during stretching though reports still feeling stretching in calf. Education and encouragement provided to cont with stretching throughout day both in chair and from bed level.  Pt left seated in recliner at end of session, all needs in reach and RN entering.  Along with RN, discussed with pt her pain and anxiety medications and schedule of meds. Pt had not received anxiety med this morning and requested from RN. Descided to wait for PRN pain meds in order to be pre-medicated for PT sessions in the afternoon, pt with no more AM therapies.    Therapy Documentation Precautions:  Precautions Precautions: Fall Restrictions Weight Bearing Restrictions: No  See Function Navigator for Current Functional Status.   Therapy/Group: Individual Therapy  Joson Sapp L 11/14/2017, 7:05 AM

## 2017-11-14 NOTE — Progress Notes (Signed)
Subjective/Complaints: Right Foot pain improved No left calf pain or swelling  ROS: pt denies nausea, vomiting, diarrhea, cough, shortness of breath or chest pain      Objective: Vital Signs: Blood pressure (!) 139/59, pulse 95, temperature 98 F (36.7 C), temperature source Oral, resp. rate 12, height 5\' 2"  (1.575 m), weight 71.5 kg (157 lb 8.3 oz), SpO2 92 %.  No results found for this or any previous visit (from the past 72 hour(s)).   HEENT: Neck clean trach site healed  Cardio: RRR without murmur. No JVD    Resp: CTA Bilaterally without wheezes or rales. Normal effort  GI: BS positive and nondistended Musculoskeletal:  No Edema, no tenderness in extremities Skin:   Intact. Warm and dry. Neuro: Alert/Oriented,  Motor: Bilateral upper extremities 4/5 proximal to distal Bilateral lower extremity is: 4-/5 hip flexion, knee extension, 2 -/5 ankle dorsi/plantar flexion (unchanged) Mild ankle edema bilateral , no foot or toe erythema no lesions, no synovitis, + tenderness to light palpation over the entire R foot General no acute distress.   Pleasant but anxiousPsych :   Assessment/Plan: 1. 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 Physiatrist is providing close team supervision and 24 hour management of active medical problems listed below. Physiatrist and rehab team continue to assess barriers to discharge/monitor patient progress toward functional and medical goals. FIM: Function - Bathing Bathing activity did not occur: Refused Position: Shower Body parts bathed by patient: Right arm, Left arm, Chest, Abdomen, Right upper leg, Left upper leg, Right lower leg, Left lower leg, Front perineal area, Buttocks, Back Body parts bathed by helper: Back Bathing not applicable: Right lower leg, Left lower leg, Right upper leg, Left upper leg, Front  perineal area, Buttocks(Declined LB) Assist Level: Supervision or verbal cues  Function- Upper Body Dressing/Undressing Upper body dressing/undressing activity did not occur: Refused What is the patient wearing?: Pull over shirt/dress Pull over shirt/dress - Perfomed by patient: Thread/unthread right sleeve, Thread/unthread left sleeve, Put head through opening, Pull shirt over trunk Assist Level: Set up Set up : To obtain clothing/put away Function - Lower Body Dressing/Undressing What is the patient wearing?: Pants, Socks, Shoes, Underwear Position: Wheelchair/chair at sink Underwear - Performed by patient: Thread/unthread right underwear leg, Thread/unthread left underwear leg, Pull underwear up/down Pants- Performed by patient: Thread/unthread right pants leg, Thread/unthread left pants leg, Pull pants up/down Pants- Performed by helper: Pull pants up/down Non-skid slipper socks- Performed by patient: Don/doff right sock, Don/doff left sock Non-skid slipper socks- Performed by helper: Don/doff right sock, Don/doff left sock(don) Socks - Performed by patient: Don/doff right sock, Don/doff left sock Assist for footwear: Supervision/touching assist Assist for lower body dressing: Touching or steadying assistance (Pt > 75%)  Function - Toileting Toileting activity did not occur: No continent bowel/bladder event Toileting steps completed by patient: Adjust clothing prior to toileting, Performs perineal hygiene, Adjust clothing after toileting Toileting steps completed by helper: Adjust clothing prior to toileting, Adjust clothing after toileting Toileting Assistive Devices: Grab bar or rail Assist level: Supervision or verbal cues  Function Programmer, multimedia transfer assistive device: Grab bar, Walker Assist level to toilet: Touching or steadying assistance (Pt > 75%) Assist level from toilet: Moderate assist (Pt 50 - 74%/lift or lower) Assist level to bedside commode (at  bedside): Touching or steadying assistance (Pt > 75%) Assist level from bedside commode (at bedside): Touching or steadying  assistance (Pt > 75%)  Function - Chair/bed transfer Chair/bed transfer method: Stand pivot Chair/bed transfer assist level: Supervision or verbal cues Chair/bed transfer assistive device: Armrests, Walker, Orthosis Chair/bed transfer details: Verbal cues for technique, Manual facilitation for placement, Manual facilitation for weight shifting, Verbal cues for precautions/safety  Function - Locomotion: Wheelchair Will patient use wheelchair at discharge?: (TBD) Type: Manual Max wheelchair distance: 125 ft Assist Level: Supervision or verbal cues Assist Level: Supervision or verbal cues Wheel 150 feet activity did not occur: Safety/medical concerns Assist Level: Supervision or verbal cues Turns around,maneuvers to table,bed, and toilet,negotiates 3% grade,maneuvers on rugs and over doorsills: No Function - Locomotion: Ambulation Ambulation activity did not occur: Safety/medical concerns Assistive device: Walker-rolling, Orthosis Max distance: 90 ft Assist level: Touching or steadying assistance (Pt > 75%) Walk 10 feet activity did not occur: Safety/medical concerns Assist level: Touching or steadying assistance (Pt > 75%) Walk 50 feet with 2 turns activity did not occur: Safety/medical concerns Assist level: Touching or steadying assistance (Pt > 75%) Walk 150 feet activity did not occur: Safety/medical concerns Walk 10 feet on uneven surfaces activity did not occur: Safety/medical concerns  Function - Comprehension Comprehension: Auditory Comprehension assist level: Follows complex conversation/direction with extra time/assistive device  Function - Expression Expression: Verbal Expression assist level: Expresses complex ideas: With extra time/assistive device  Function - Social Interaction Social Interaction assist level: Interacts appropriately with  others with medication or extra time (anti-anxiety, antidepressant).  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 DVT Prophylaxis/Anticoagulation: Subcutaneous heparin.   Small isolated asymptomatic left peroneal  DVT, stable on repeat study on 2/8, plan for follow-up Doppler, will need prior to d/c 3. Pain Management:Hydrocodone as needed. -schedule tylenol bid -Neuropathic foot pain start gabapentin 100mg  TID 2/13, added cymbalta 20mg  on 2/13,    -Increase gabapentin to 200 mg 3 times daily 2/17, helping    -contCymbalta  4. Mood:Ativan 0.5 mg every 4 hours as needed.Provide emotional support 5. Neuropsych: This patientiscapable of making decisions on herown behalf. 6. Skin/Wound Care:Routine skin checks 7. Fluids/Electrolytes/Nutrition:Routine I&O's 8.Severe COPD with tobacco abuse. Continue nebulizers. Provide counseling regarding cessation of nicotine products.NicoDerm patch as well as prednisone taper, No SOB    10. S/p Trach: decannulated , 11.  Leukocytosis likely steroid related, afebrile, off po and on corticosteroid nebs   WBCs 14.5 on 2/11     12.  Vaginal itching some improvement no rash in peri area, has received Diflucan x 1 which is usual dose for vaginal candidiasis  13. Acute blood  loss anemia   Hemoglobin 11.5 on 2/11   Continue to monitor 14. Hypoalbuminemia   Supplement initiated on 2/9  15. Hypertension   Continue Cardizem    Vitals:   11/13/17 2134 11/14/17 0555  BP: (!) 144/65 (!) 139/59  Pulse:  95  Resp:  12  Temp:  98 F (36.7 C)  SpO2:    controlled 2/19  LOS (Days) 18 A FACE TO FACE EVALUATION WAS PERFORMED  Erick Colace 11/14/2017, 7:57 AM

## 2017-11-14 NOTE — Progress Notes (Signed)
Physical Therapy Session Note  Patient Details  Name: Sally [name] MRN: [id number] Date of Birth: 09/04/1951  Today's Date: 11/14/2017 PT Individual Time: 5621-3086 PT Individual Time Calculation (min): 58 min   Short Term Goals: Week 2:  PT Short Term Goal 1 (Week 2): STG =LTG due to ELOS PT Short Term Goal 1 - Progress (Week 2): Progressing toward goal  Skilled Therapeutic Interventions/Progress Updates:    Pt seated in w/c upon PT arrival, agreeable to therapy tx and denies pain. Pt propelled w/c from room>gym x 150 ft using B UEs. Pt ascended/descended 1 step x 3 using single handrail and min assist, x2 trials with verbal cues for techniques. Pt ambulated x 80 ft with RW working on endurance and activity tolerance. Pt worked on dynamic standing balance without UE support to throw horseshoes, x 2 trials. Pt ambulated x 110 ft with RW and supervision, limited by endurance, requiring seated rest break before making it all the way back to her room. Pt performed stand pivot from w/c>recliner with supervision using RW. Pt doffed shoes/AFOs and donned yellow socks, left seated in recliner with needs in reach.   Therapy Documentation Precautions:  Precautions Precautions: Fall Restrictions Weight Bearing Restrictions: No   See Function Navigator for Current Functional Status.   Therapy/Group: Individual Therapy  Cresenciano Genre, PT, DPT 11/14/2017, 3:58 PM

## 2017-11-14 NOTE — Consult Note (Signed)
Neuropsychological Consultation   Patient:   Cam Harnden   DOB:   27-May-1951  MR Number:  [id number]  Location:  [facility] [id number] [place] [address] Dept: [phone] Loc: [phone]           Date of Service:   11/14/2017  Start Time:   1 PM End Time:   2 PM  Provider/Observer:  Arley Phenix, Psy.D.       Clinical Neuropsychologist       Billing Code/Service: [id number] 4 Units  Chief Complaint:    Katilin Raynes is a 67 year old female with history of hypertension, severe COPD with tobacco abuse.  The patient presented on 09/26/2017 with increasing shortness of breath and cough.  She failed BiPAP and required intubation.  Elevated white blood cell count and was placed on broad-spectrum antibiotics.  Prolonged intubation requiring trach tube on 10/10/2017.  Pneumothorax requiring chest tube was removed on 10/13/2017.  Due to extended illness and hospital stay the patient was referred for physical/occupational therapy and admitted for comprehensive rehabilitation program.  The patient had been noted to be teary and upset about her overall health function and the extended hospital stay.  The patient is having some adjustment difficulties but as her discharge date and improvement overall medically has developed the patient's mood has improved.  Reason for Service:  Dortha Neighbors was referred for neuropsychological/psychological consultation due to adjustment and adaptive concerns and issues.  Below is the HPI for the current admission.  UVO:ZDGUY Trujillois a 67 y.o.right handedfemalewith history of hypertension, severe COPD with tobacco abuse. Per chart review patient lives with spouse. Husband can assist as needed as well as daughter. Reported to be independent prior to admission. Presented 09/26/2017 with increasing shortness of breath and cough she failed BiPAP and required  intubation.WBC 21,900.Placed on broad-spectrum antibiotics.hospital course prolonged intubation requiring tracheostomy tube 10/10/2017 downsize to a #4 cuffless anddecannulated02/09/2017.Pneumothorax requiring chest tube that was removed 10/13/2017. Currently on a regular diet.Completed a 5 day course of Zosyn for right lower lobe infiltrate.Subcutaneous heparin for DVT prophylaxis.Fitted with bilateral PRAFO boots.Acute on chronic anemia 10.9 and monitored. Physical occupational therapy evaluations completed 10/14/2017 with recommendations of physical medicine rehabilitation consult.Patient was admitted for a comprehensive rehabilitation program.  Current Status:  The patient reports that she is doing much better with mood and particularly in relationship to anxiety and worry.  She is feeling good about being stronger but still worried about weakness in right leg vs left leg.    Behavioral Observation: Juanitta Earnhardt  presents as a 67 y.o.-year-old Right Caucasian Female who appeared her stated age. her dress was Appropriate and she was Well Groomed and her manners were Appropriate to the situation.  her participation was indicative of Appropriate and Attentive behaviors.  There were not any physical disabilities noted.  she displayed an appropriate level of cooperation and motivation.     Interactions:    Active Appropriate and Attentive  Attention:   within normal limits and attention span and concentration were age appropriate  Memory:   within normal limits; recent and remote memory intact  Visuo-spatial:  not examined  Speech (Volume):  normal  Speech:   normal; normal  Thought Process:  Coherent and Relevant  Though Content:  WNL; not suicidal and not homicidal  Orientation:   person, place, time/date and situation  Judgment:   Good  Planning:   Good  Affect:    Anxious  Mood:    Anxious  Insight:   Good  Intelligence:   normal  Medical History:   Past Medical  History:  Diagnosis Date  . Asthma   . Back pain   . COPD (chronic obstructive pulmonary disease) (HCC)   . Crohn disease (HCC)   . Hypertension        Psychiatric History:  While the patient denies any significant prior history of major depression/clinical depression she does acknowledge a history of some anxiety.  She reports that she has had some care in the past for anxiety and worry but is never experienced severe disruptive anxiety in the past.  Family Med/Psych History:  Family History  Problem Relation Age of Onset  . Hypertension Other     Risk of Suicide/Violence: low patient denies any suicidal or homicidal ideation.  Impression/DX:  Inas Avena is a 67 year old female with history of hypertension, severe COPD with tobacco abuse.  The patient presented on 09/26/2017 with increasing shortness of breath and cough.  She failed BiPAP and required intubation.  Elevated white blood cell count and was placed on broad-spectrum antibiotics.  Prolonged intubation requiring trach tube on 10/10/2017.  Pneumothorax requiring chest tube was removed on 10/13/2017.  Due to extended illness and hospital stay the patient was referred for physical/occupational therapy and admitted for comprehensive rehabilitation program.  The patient had been noted to be teary and upset about her overall health function and the extended hospital stay.  The patient is having some adjustment difficulties but as her discharge date and improvement overall medically has developed the patient's mood has improved.   The patient reports that she is doing much better with mood and particularly in relationship to anxiety and worry.  She is feeling good about being stronger but still worried about weakness in right leg vs left leg.    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        Electronically Signed   _______________________ Arley Phenix, Psy.D.

## 2017-11-15 ENCOUNTER — Inpatient Hospital Stay (HOSPITAL_COMMUNITY): Payer: Medicare Other | Admitting: Physical Therapy

## 2017-11-15 ENCOUNTER — Inpatient Hospital Stay (HOSPITAL_COMMUNITY): Payer: Medicare Other | Admitting: Occupational Therapy

## 2017-11-15 DIAGNOSIS — I82402 Acute embolism and thrombosis of unspecified deep veins of left lower extremity: Secondary | ICD-10-CM

## 2017-11-15 NOTE — Progress Notes (Signed)
Subjective/Complaints: No left calf pain  ROS: pt denies nausea, vomiting, diarrhea, cough, shortness of breath or chest pain      Objective: Vital Signs: Blood pressure (!) 145/59, pulse 86, temperature 97.6 F (36.4 C), temperature source Oral, resp. rate 18, height 5\' 2"  (1.575 m), weight 72.5 kg (159 lb 13.3 oz), SpO2 96 %.  No results found for this or any previous visit (from the past 72 hour(s)).   HEENT: Neck clean trach site healed  Cardio: RRR without murmur. No JVD    Resp: CTA Bilaterally without wheezes or rales. Normal effort  GI: BS positive and nondistended Musculoskeletal:  No Edema, no tenderness in extremities Skin:   Intact. Warm and dry. Neuro: Alert/Oriented,  Motor: Bilateral upper extremities 4/5 proximal to distal Bilateral lower extremity is: 4-/5 hip flexion, knee extension, 2 -/5 ankle dorsi/plantar flexion (unchanged) Mild ankle edema bilateral , no foot or toe erythema no lesions, no synovitis, + tenderness to light palpation over the entire R foot General no acute distress.   Pleasant but anxiousPsych :   Assessment/Plan: 1. 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 Physiatrist is providing close team supervision and 24 hour management of active medical problems listed below. Physiatrist and rehab team continue to assess barriers to discharge/monitor patient progress toward functional and medical goals. FIM: Function - Bathing Bathing activity did not occur: Refused Position: Shower Body parts bathed by patient: Right arm, Left arm, Chest, Abdomen, Right upper leg, Left upper leg, Right lower leg, Left lower leg, Front perineal area, Buttocks, Back Body parts bathed by helper: Back Bathing not applicable: Right lower leg, Left lower leg, Right upper leg, Left upper leg, Front perineal area, Buttocks(Declined  LB) Assist Level: Supervision or verbal cues  Function- Upper Body Dressing/Undressing Upper body dressing/undressing activity did not occur: Refused What is the patient wearing?: Pull over shirt/dress Pull over shirt/dress - Perfomed by patient: Thread/unthread right sleeve, Thread/unthread left sleeve, Put head through opening, Pull shirt over trunk Assist Level: Set up Set up : To obtain clothing/put away Function - Lower Body Dressing/Undressing What is the patient wearing?: Pants, Socks, Shoes, Underwear, AFO Position: Wheelchair/chair at sink Underwear - Performed by patient: Thread/unthread right underwear leg, Thread/unthread left underwear leg, Pull underwear up/down Pants- Performed by patient: Thread/unthread right pants leg, Thread/unthread left pants leg, Pull pants up/down Pants- Performed by helper: Pull pants up/down Non-skid slipper socks- Performed by patient: Don/doff right sock, Don/doff left sock Non-skid slipper socks- Performed by helper: Don/doff right sock, Don/doff left sock(don) Socks - Performed by patient: Don/doff right sock, Don/doff left sock Shoes - Performed by patient: Don/doff right shoe, Don/doff left shoe, Fasten right, Fasten left AFO - Performed by patient: Don/doff right AFO, Don/doff left AFO Assist for footwear: Supervision/touching assist Assist for lower body dressing: Supervision or verbal cues  Function - Toileting Toileting activity did not occur: No continent bowel/bladder event Toileting steps completed by patient: Adjust clothing prior to toileting, Performs perineal hygiene, Adjust clothing after toileting Toileting steps completed by helper: Adjust clothing prior to toileting, Adjust clothing after toileting Toileting Assistive Devices: Grab bar or rail Assist level: Supervision or verbal cues  Function - Archivist transfer assistive device: Grab bar, Walker Assist level to toilet: Touching or steadying assistance (Pt  > 75%) Assist level from toilet: Moderate assist (Pt 50 - 74%/lift or lower) Assist level to bedside commode (at  bedside): Touching or steadying assistance (Pt > 75%) Assist level from bedside commode (at bedside): Touching or steadying assistance (Pt > 75%)  Function - Chair/bed transfer Chair/bed transfer method: Stand pivot, Ambulatory Chair/bed transfer assist level: Supervision or verbal cues Chair/bed transfer assistive device: Armrests, Walker, Orthosis Chair/bed transfer details: Verbal cues for technique, Manual facilitation for placement, Manual facilitation for weight shifting, Verbal cues for precautions/safety  Function - Locomotion: Wheelchair Will patient use wheelchair at discharge?: (TBD) Type: Manual Max wheelchair distance: 125 ft Assist Level: Supervision or verbal cues Assist Level: Supervision or verbal cues Wheel 150 feet activity did not occur: Safety/medical concerns Assist Level: Supervision or verbal cues Turns around,maneuvers to table,bed, and toilet,negotiates 3% grade,maneuvers on rugs and over doorsills: No Function - Locomotion: Ambulation Ambulation activity did not occur: Safety/medical concerns Assistive device: Walker-rolling, Orthosis Max distance: 110 ft Assist level: Supervision or verbal cues Walk 10 feet activity did not occur: Safety/medical concerns Assist level: Supervision or verbal cues Walk 50 feet with 2 turns activity did not occur: Safety/medical concerns Assist level: Supervision or verbal cues Walk 150 feet activity did not occur: Safety/medical concerns Walk 10 feet on uneven surfaces activity did not occur: Safety/medical concerns  Function - Comprehension Comprehension: Auditory Comprehension assist level: Follows complex conversation/direction with extra time/assistive device  Function - Expression Expression: Verbal Expression assist level: Expresses complex ideas: With extra time/assistive device  Function - Social  Interaction Social Interaction assist level: Interacts appropriately with others with medication or extra time (anti-anxiety, antidepressant).  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 DVT Prophylaxis/Anticoagulation: Subcutaneous heparin.   Small isolated asymptomatic left peroneal  DVT, stable on repeat study on 2/8 and on 2/19 no further studies needed, no anticoag except for ASA 3. Pain Management:Hydrocodone as needed. -schedule tylenol bid -Neuropathic foot pain start gabapentin 100mg  TID 2/13, added cymbalta 20mg  on 2/13,    -Increase gabapentin to 200 mg 3 times daily 2/17, helping    -contCymbalta  4. Mood:Ativan 0.5 mg every 4 hours as needed.Provide emotional support 5. Neuropsych: This patientiscapable of making decisions on herown behalf. 6. Skin/Wound Care:Routine skin checks 7. Fluids/Electrolytes/Nutrition:Routine I&O's 8.Severe COPD with tobacco abuse. Continue nebulizers. Provide counseling regarding cessation of nicotine products.NicoDerm patch as well as prednisone taper, No SOB    10. S/p Trach: decannulated , 11.  Leukocytosis likely steroid related, afebrile, off po and on corticosteroid nebs   WBCs 14.5 on 2/11     12.  Vaginal itching some improvement no rash in peri area, has received Diflucan  x 1 which is usual dose for vaginal candidiasis  13. Acute blood loss anemia   Hemoglobin 11.5 on 2/11   Continue to monitor 14. Hypoalbuminemia   Supplement initiated on 2/9  15. Hypertension   Continue Cardizem    Vitals:   11/14/17 2131 11/15/17 0616  BP:  (!) 145/59  Pulse: 94 86  Resp: 16 18  Temp:  97.6 F (36.4  C)  SpO2: 95% 96%  controlled 2/20  LOS (Days) 19 A FACE TO FACE EVALUATION WAS PERFORMED  Erick Colace 11/15/2017, 9:57 AM

## 2017-11-15 NOTE — Progress Notes (Signed)
Occupational Therapy Session Note  Patient Details  Name: Sally [name] MRN: [id number] Date of Birth: 01-04-1951  Today's Date: 11/15/2017 OT Individual Time: 0930-1030 OT Individual Time Calculation (min): 60 min    Short Term Goals: Week 3:  OT Short Term Goal 1 (Week 3): STG=LTG due to LOS  Skilled Therapeutic Interventions/Progress Updates:    Pt seen for OT ADL bathing/dressing session. Pt asleep in supine upon arrival, agreeable to tx session and desiring to shower. She ambulated throughout session with RW and guarding assist. Pt ambulating at very slow rate of speed due to cont complaints of pain in R LE as a "stretching" pain. Pt reports she was pre-medicated prior to tx session.  She completed toileting task with supervision, able to stand from standard height tolet with use of grab bar and supervision, an improvement from previous sessions. Discussed use of BSC over toilet for elevated surface at home. Pt bathed with distant supervision seated on tub bench. She returned to w/c to dress with supervision, standing at RW to pull pants up. She was able to don B AFOs/shoes with use of LH shoe horn with increased time.  She completed grooming tasks standing at sink, requiring seated rest breaks btwn tasks. Education provided throughout session regarding d/c planning and how to implement exercise into daily activities in order to cont to build functional activity tolerance.  Pt left seated in w/c at end of session, all needs in reach.   Therapy Documentation Precautions:  Precautions Precautions: Fall Restrictions Weight Bearing Restrictions: No   See Function Navigator for Current Functional Status.   Therapy/Group: Individual Therapy  Danielle Mink L 11/15/2017, 7:12 AM

## 2017-11-15 NOTE — Plan of Care (Signed)
  Progressing RH BOWEL ELIMINATION RH STG MANAGE BOWEL WITH ASSISTANCE Description STG Manage Bowel with min Assistance.  11/15/2017 7564 - Progressing by Marigene Ehlers, RN RH BLADDER ELIMINATION RH STG MANAGE BLADDER WITH ASSISTANCE Description STG Manage Bladder With min Assistance  11/15/2017 0333 - Progressing by Marigene Ehlers, RN RH SKIN INTEGRITY RH STG SKIN FREE OF INFECTION/BREAKDOWN Description While on rehab  11/15/2017 0333 - Progressing by Marigene Ehlers, RN RH PAIN MANAGEMENT RH STG PAIN MANAGED AT OR BELOW PT'S PAIN GOAL Description Less than 3 out of 10  11/15/2017 0333 - Progressing by Marigene Ehlers, RN

## 2017-11-15 NOTE — Progress Notes (Signed)
Physical Therapy Session Note  Patient Details  Name: Sally [name] MRN: [id number] Date of Birth: [birth year]  Today's Date: 11/15/2017 PT Individual Time: 1110-1205 PT Individual Time Calculation (min): 55 min   Short Term Goals: Week 3:  PT Short Term Goal 2 (Week 3): STG =LTG due to ELOS  Skilled Therapeutic Interventions/Progress Updates: Pt presented in w/c agreeable to therapy. Pt propelled to day room and performed NuStep L1 x 6 min for endurance. Pt propelled to rehab gym and after extended rest pt performed stair training x 4 with L rail with min A for safety. Provided verbal cues for BUE placement and to avoid excessive pulling with BUE. Participated in LE therex LAQ 2.5# 2x10, standing hip abd 2 x 10, sit to stand no AD x 5. Pt propelled back to room and remained in w/c at end of session with call bell within reach and lunch tray in place.      Therapy Documentation Precautions:  Precautions Precautions: Fall Restrictions Weight Bearing Restrictions: No General:   Vital Signs:  Pain: Pain Assessment Pain Assessment: 0-10 Pain Score: 2  Pain Type: Acute pain Pain Location: Leg Pain Orientation: Right Pain Radiating Towards: foot Pain Descriptors / Indicators: Throbbing Pain Frequency: Constant Pain Onset: On-going Pain Intervention(s): Medication (See eMAR)   See Function Navigator for Current Functional Status.   Therapy/Group: Individual Therapy  Dimitri Dsouza  Sally [name], PTA  11/15/2017, 12:10 PM

## 2017-11-16 ENCOUNTER — Inpatient Hospital Stay (HOSPITAL_COMMUNITY): Payer: Medicare Other | Admitting: Occupational Therapy

## 2017-11-16 ENCOUNTER — Inpatient Hospital Stay (HOSPITAL_COMMUNITY): Payer: Medicare Other | Admitting: Physical Therapy

## 2017-11-16 NOTE — Progress Notes (Signed)
Occupational Therapy Session Note  Patient Details  Name: Sally [name] MRN: [id number] Date of Birth: March 20, 1951  Today's Date: 11/16/2017 OT Individual Time: 1458-1528(make up time) OT Individual Time Calculation (min): 30 min    Short Term Goals: Week 3:  OT Short Term Goal 1 (Week 3): STG=LTG due to LOS  Skilled Therapeutic Interventions/Progress Updates:    Treatment session with focus on activity tolerance, functional transfers, and d/c planning.  Pt received in recliner reporting desire to organize room and pack up in preparation for d/c home tomorrow.  Utilized RW for mobility in room with close supervision while obtaining items from [employer] and sorted items.  Pt able to stabilize self in standing at dresser, utilized w/c for energy conservation with prolonged activity.  Pt reports need to toilet, secondary to fatigue completed stand pivot w/c <> toilet with close supervision.  Pt demonstrated good balance reactions during sit <> stand and dynamic standing for clothing management.  Returned to recliner stand pivot with close supervision.  Therapy Documentation Precautions:  Precautions Precautions: Fall Precaution Comments: B foot drop Restrictions Weight Bearing Restrictions: No General:   Vital Signs: Therapy Vitals Temp: 98.4 F (36.9 C) Temp Source: Oral Pulse Rate: 89 Resp: 18 BP: (!) 160/60 Patient Position (if appropriate): Sitting Oxygen Therapy SpO2: 99 % O2 Device: Not Delivered Pain: Pain Assessment Pain Assessment: 0-10 Pain Score: 5  Pain Type: Neuropathic pain Pain Location: Leg Pain Orientation: Right;Left Pain Radiating Towards: right foot Pain Descriptors / Indicators: Throbbing;Aching Pain Frequency: Constant Pain Onset: On-going Pain Intervention(s): Medication (See eMAR)  See Function Navigator for Current Functional Status.   Therapy/Group: Individual Therapy  Rosalio Loud 11/16/2017, 3:34 PM

## 2017-11-16 NOTE — Discharge Instructions (Signed)
Inpatient Rehab Discharge Instructions  Sally [name] Discharge date and time: No discharge date for patient encounter.   Activities/Precautions/ Functional Status: Activity: activity as tolerated Diet: regular diet Wound Care: keep wound clean and dry Functional status:  ___ No restrictions     ___ Walk up steps independently ___ 24/7 supervision/assistance   ___ Walk up steps with assistance ___ Intermittent supervision/assistance  ___ Bathe/dress independently ___ Walk with walker     __x_ Bathe/dress with assistance ___ Walk Independently    ___ Shower independently ___ Walk with assistance    ___ Shower with assistance ___ No alcohol     ___ Return to work/school ________  COMMUNITY REFERRALS UPON DISCHARGE:   Home Health:   PT     OT  Agency:  [facility]:  [phone] Medical Equipment/Items Ordered:   Rolling walker; 18"x16" hemi-height, lightweight wheelchair  Agency/Supplier:  [facility]        Phone:  [phone]  Special Instructions: No driving/smoking   My questions have been answered and I understand these instructions. I will adhere to these goals and the provided educational materials after my discharge from the hospital.  Patient/Caregiver Signature _______________________________ Date __________  Clinician Signature _______________________________________ Date __________  Please bring this form and your medication list with you to all your follow-up doctor's appointments.

## 2017-11-16 NOTE — Discharge Summary (Signed)
NAMEZENNIA, TUONG                ACCOUNT NO.:  [id number]  MEDICAL RECORD NO.:  [id number]  LOCATION:                                 FACILITY:  PHYSICIAN:  Erick Colace, M.D.   DATE OF BIRTH:  DATE OF ADMISSION:  10/27/2017 DATE OF DISCHARGE:  11/17/2017                              DISCHARGE SUMMARY   DISCHARGE DIAGNOSES: 1. 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 2. Subcutaneous heparin for deep vein thrombosis prophylaxis with     small isolated left peroneal deep vein thrombosis. 3. Tracheostomy -- decannulated on October 27, 2017. 4. Pain management. 5. Mood. 6. Severe chronic obstructive pulmonary disease with tobacco abuse. 7. Vaginal itching, improved. 8. Hypertension.  This is a 67 year old right-handed female with history of hypertension, COPD with tobacco abuse, who lives with husband, independent prior to admission.  Presented on September 26, 2017, with increasing shortness of breath, cough, required intubation, WBC 21,000.  Placed on broad- spectrum antibiotics.  Prolonged course of intubation requiring tracheostomy, October 10, 2017, slowly downsized and later decannulating, October 27, 2017.  Pneumothorax requiring chest tube that was removed on October 13, 2017.  Diet advanced to regular. Subcutaneous heparin for DVT prophylaxis.  Acute on chronic anemia, 10.9 and monitored.  Physical and occupational therapy ongoing.  The patient was admitted for a comprehensive rehab program.  PAST MEDICAL HISTORY:  See discharge diagnoses.  SOCIAL HISTORY:  Lives with husband independent prior to admission.  FUNCTIONAL STATUS UPON ADMISSION TO REHAB SERVICES:  Was +2, max assist sit to stand, minimal assist sit to supine, mod to max assist activities of daily living.  PHYSICAL EXAMINATION:  VITAL SIGNS:  Blood pressure 153/60, pulse 92, temperature 97, respirations 18. GENERAL:  Alert female, oriented x3. HEENT:  EOMs  intact. NECK:  Supple.  Nontender.  No JVD.  Tracheostomy site healing. CARDIAC:  Rate controlled. ABDOMEN:  Soft, nontender.  Good bowel sounds. LUNGS:  Clear to auscultation without wheeze. EXTREMITIES:  Upper extremity motor strength 3+/5 to 4/5 proximal and distal.  Left lower extremity; 2/5 hip flexors, knee extension, 3/5 ankle plantar flexion, 0/5 dorsiflexion.  REHABILITATION HOSPITAL COURSE:  The patient was admitted to Inpatient Rehab Services with therapies initiated on a 3-hour daily basis, consisting of physical therapy, occupational therapy, and rehabilitation nursing.  The following issues were addressed during the patient's rehabilitation stay.  Pertaining to Mr. Lutes's critical illness myopathy related to ventilatory respiratory failure, she continued to progress nicely.  She had been using AFOs for ambulation.  The patient had been on subcutaneous heparin for DVT prophylaxis.  Routine Doppler studies, November 03, 2017, showed a small isolated, asymptomatic left peroneal DVT.  Followup scans, February 19, showed no further propagation of DVT.  No anticoagulation needed except aspirin.  Pain management with use of hydrocodone as needed as well as Cymbalta 20 mg daily and Neurontin 200 mg 3 times daily.  She had required tracheostomy decannulated on October 27, 2017, oxygen saturations remained greater than 90%.  She would follow up Outpatient Pulmonary Services in [place], [place].  She did have a history of tobacco abuse.  She received full counseling  in regard to cessation of nicotine products. She was tapered from a NicoDerm patch.  Acute-on-chronic anemia, latest hemoglobin 11.5, no bleeding episodes.  Blood pressure, heart rate remained controlled with the use of Cardizem 30 mg every 8 hours.  The patient received weekly collaborative interdisciplinary team conferences to discuss estimated length of stay, family teaching, any barriers to discharge.   She could propel her wheelchair to the day room. Participated in lower extremity strengthening, performed squat pivot to wheelchair, propelled supervision to again the day room.  The patient able to perform all tasks with some increased time and minimal assistance.  Ambulates 100 feet rolling walker, minimal guard, activities of daily living, and homemaking, dressing, grooming, hygiene, ambulates throughout the sessions rolling walker and guard assist.  She completed toileting tasks with supervision, able to stand from a standard height toilet and use of a grab bar.  The patient encouraged of the overall progress and plan was discharge to home.  DISCHARGE MEDICATIONS:  Included: 1. Aspirin 81 mg p.o. daily. 2. Cardizem 30 mg p.o. every 8 hours. 3. Cymbalta 20 mg p.o. daily. 4. Pepcid 20 mg p.o. b.i.d. 5. Neurontin 200 mg p.o. t.i.d. 6. Nicoderm patch taper as directed. 7. Hydrocodone 1 tablet every 6 hours as needed pain. 8. Ativan 0.5 mg every 4 hours as needed anxiety. 9. Robaxin 500 mg p.o. every 6 hours as needed spasms.  FOLLOWUP:  The patient would follow up outpatient with Dr. Claudette Laws as advised; [facility], [place], [place], call for appointment; Dr. Luiz Iron, Medical Management.  SPECIAL INSTRUCTIONS:  No smoking.     Mariam Dollar, P.A.   ______________________________ Erick Colace, M.D.    DA/MEDQ  D:  11/16/2017  T:  11/16/2017  Job:  [id number]  cc:   [facility] Dennis Bast, MD Erick Colace, M.D.

## 2017-11-16 NOTE — Progress Notes (Signed)
Social Work Patient ID: Sally [name], female   DOB: 05-05-1951, 67 y.o.   MRN: [id number]   CSW met with pt 11-15-17 to update her on team conference discussion and that 11-17-17 remains targeted d/c date.  Pt is pleased with her progress and feels she stayed the exact right amount of time and is confident about going home.  PT wanted a session with pt's family and CSW called husband, but he did not return call.  Hopefully he will attend pt's scheduled therapies today, as pt was going to tell him why CSW had called when she talked to him.  CSW has arranged HH.  Pt wanted to go with [facility], but they cannot provide OT, so CSW set pt up with [facility].  CSW also ordered DME.  CSW will continue to follow and assist as needed.

## 2017-11-16 NOTE — Progress Notes (Signed)
Occupational Therapy Session Note  Patient Details  Name: Sally [name] MRN: [id number] Date of Birth: Dec 21, 1950  Today's Date: 11/16/2017 OT Individual Time: 0900-1000 and 1400-1430 OT Individual Time Calculation (min): 60 min and 30 min   Short Term Goals: Week 3:  OT Short Term Goal 1 (Week 3): STG=LTG due to LOS  Skilled Therapeutic Interventions/Progress Updates:    Session One: Pt seen for OT ADL bathing/dressing session. Pt asleep in supine upon arrival, easily awoken but agreeable to tx session. She cont with complaints of pain in B calves due to excessive stretching in WBing position due to foot drop. Cont to educate pt on importance of PRAFO boots at night to facilitate and promote functional positioning. Pt ambulated throughout session with close supervision using RW, requiring occasional cues for proper RW management in functional context. She bathed seated on tub bench with distant supervision, standing with garb bars to complete pericare/ buttock hygiene.  She returned to w/c to dress, standing with supervision at RW to pull pants up. With increased time and use of LH shoe horn, pt able to don B AFOs and shoes. She completed grooming tasks standing at sink with distant supervision, using B UEs in functional manner while completing task. Pt returned to recliner at end of session, left seated with all needs in reach.  Throughout session, pt required seated rest breaks due to decreased functional activity tolerance.  Session Two: Pt seen for OT session focusing on functional transfers. Pt asleep in recliner upon arrival, easily awoken and agreeable to tx session. She completed functional transfers throughout session with RW and supervision. She self propelled w/c to ADL apartment mod I. Completed simulated tub/shower transfer utilizing tub bench, completed with supervision. Education provided regarding set-up and use of bench at d/c. Pt returned to room and completed stand pivot  transfer to recliner. Pt left in recliner with all needs in reach.   Therapy Documentation Precautions:  Precautions Precautions: Fall Restrictions Weight Bearing Restrictions: No   Pain: No/denies pain  See Function Navigator for Current Functional Status.   Therapy/Group: Individual Therapy  Rishikesh Khachatryan L 11/16/2017, 6:40 AM

## 2017-11-16 NOTE — Patient Care Conference (Signed)
Inpatient RehabilitationTeam Conference and Plan of Care Update Date: 11/15/2017   Time: 10:45 AM    Patient Name: Sally [name]      Medical Record Number: [id number]  Date of Birth: [birth year] Sex: Female         Room/Bed: 4W09C/4W09C-01 Payor Info: Payor: [employer] MEDICARE / Plan: [employer] MEDICARE / Product Type: *No Product type* /    Admitting Diagnosis: cril Illness neropathy  Admit Date/Time:  10/27/2017  8:07 PM Admission Comments: No comment available   Primary Diagnosis:  <principal problem not specified> Principal Problem: <principal problem not specified>  Patient Active Problem List   Diagnosis Date Noted  . Adjustment disorder with depressed mood   . Acute blood loss anemia   . Chronic obstructive pulmonary disease (HCC)   . Deep venous thrombosis (HCC)   . Critical illness myopathy   . Critical illness neuropathy (HCC)   . Leukocytosis   . Tracheostomy status (HCC)   . Hypoxemia   . Pneumothorax, traumatic   . Acute respiratory failure (HCC)   . Influenza A   . Acute on chronic respiratory failure (HCC)   . Acute respiratory failure with hypoxemia (HCC) 09/26/2017  . RSV (acute bronchiolitis due to respiratory syncytial virus) 10/06/2016  . COPD exacerbation (HCC) 07/14/2016  . Acute respiratory failure with hypoxia and hypercapnia (HCC)   . Essential hypertension   . Dyslipidemia     Expected Discharge Date: Expected Discharge Date: 11/17/17  Team Members Present: Physician leading conference: Dr. Claudette Laws Social Worker Present: Staci Acosta, LCSW Nurse Present: Willey Blade, RN PT Present: Grier Rocher, PT;Rosita Dechalus, PTA OT Present: Johnsie Cancel, OT SLP Present: Reuel Derby, SLP PPS Coordinator present : Tora Duck, RN, CRRN     Current Status/Progress Goal Weekly Team Focus  Medical   calf dvt stable  Increase ROM RLE and reduce pain  D/C in am   Bowel/Bladder   continent of B/B  maintain continence  Assist with toileting as  needed   Swallow/Nutrition/ Hydration             ADL's   Supervision- min A overall, VCs for anxiety  Supervision overall  Functional standing balance/ endurance; d/c planning   Mobility   gait 137ft min guard, supervision transfers, 6in steps x 2 with LLE and minA  supervision assist transfers and gait for short distances. supervision assist WC mobility   stair training, endurance, standing tolerance, family ed   Communication             Safety/Cognition/ Behavioral Observations            Pain   c/o foot pain pain 7/10, relieved with PRN's  Pain < 3  Assess Qshift and PRN   Skin   Abdominal bruising (heparin)  Maintain skin integrity  Assess Qshift and PRN    Rehab Goals Patient on target to meet rehab goals: Yes Rehab Goals Revised: none *See Care Plan and progress notes for long and short-term goals.     Barriers to Discharge  Current Status/Progress Possible Resolutions Date Resolved   Physician    Medical stability;Lack of/limited family support     progressing  Caregiver training      Nursing                  PT                    OT  SLP                SW                Discharge Planning/Teaching Needs:  Pt plans to return to her home where her husband and family will assist her.  Pt's family will come for family education closer to d/c.   Team Discussion:  Pt with DVT, but it has stabilized and pt will only need aspirin at home.  Pt is continent for the most part.  Pt is supervision overall for transfers and is walking to the bathroom now.  Pt with a stretching pain in right calf and MD and OT both encouraged pt to wear PRAFO and this education will continue.  Pt's anxiety is decreased and pt is progressing well with PT also, walking 100' min guard/S.  Pt is working on stairs and is refusing to be bumped up the stairs.  Will need min A for 2-3 stairs.  Pt will be ready for d/c on Friday.  Revisions to Treatment Plan:  none    Continued  Need for Acute Rehabilitation Level of Care: The patient requires daily medical management by a physician with specialized training in physical medicine and rehabilitation for the following conditions: Daily direction of a multidisciplinary physical rehabilitation program to ensure safe treatment while eliciting the highest outcome that is of practical value to the patient.: Yes Daily medical management of patient stability for increased activity during participation in an intensive rehabilitation regime.: Yes Daily analysis of laboratory values and/or radiology reports with any subsequent need for medication adjustment of medical intervention for : Neurological problems;Pulmonary problems;Mood/behavior problems  Mollie Rossano, [place] 11/16/2017, 11:57 AM

## 2017-11-16 NOTE — Discharge Summary (Signed)
Discharge summary job [id number]

## 2017-11-16 NOTE — Progress Notes (Signed)
Subjective/Complaints: No calf or leg pain.  Patient is looking forward to discharge in a.m.  ROS: pt denies nausea, vomiting, diarrhea, cough, shortness of breath or chest pain      Objective: Vital Signs: Blood pressure 138/60, pulse 94, temperature 97.6 F (36.4 C), temperature source Oral, resp. rate 16, height 5\' 2"  (1.575 m), weight 73 kg (160 lb 15 oz), SpO2 94 %.  No results found for this or any previous visit (from the past 72 hour(s)).   HEENT: Neck clean trach site healed  Cardio: RRR without murmur. No JVD    Resp: CTA Bilaterally without wheezes or rales. Normal effort  GI: BS positive and nondistended Musculoskeletal:  No Edema, no tenderness in extremities Skin:   Intact. Warm and dry. Neuro: Alert/Oriented,  Motor: Bilateral upper extremities 4/5 proximal to distal Bilateral lower extremity is: 4-/5 hip flexion, knee extension, 2 -/5 ankle dorsi/plantar flexion (unchanged) Mild ankle edema bilateral , no foot or toe erythema no lesions, no synovitis, + tenderness to light palpation over the entire R foot General no acute distress.   Pleasant but anxiousPsych :   Assessment/Plan: 1. 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 Physiatrist is providing close team supervision and 24 hour management of active medical problems listed below. Physiatrist and rehab team continue to assess barriers to discharge/monitor patient progress toward functional and medical goals. FIM: Function - Bathing Bathing activity did not occur: Refused Position: Shower Body parts bathed by patient: Right arm, Left arm, Chest, Abdomen, Right upper leg, Left upper leg, Right lower leg, Left lower leg, Front perineal area, Buttocks, Back Body parts bathed by helper: Back Bathing not applicable: Right lower leg, Left lower leg, Right upper leg, Left upper leg,  Front perineal area, Buttocks(Declined LB) Assist Level: Supervision or verbal cues  Function- Upper Body Dressing/Undressing Upper body dressing/undressing activity did not occur: Refused What is the patient wearing?: Pull over shirt/dress Pull over shirt/dress - Perfomed by patient: Thread/unthread right sleeve, Thread/unthread left sleeve, Put head through opening, Pull shirt over trunk Assist Level: Set up Set up : To obtain clothing/put away Function - Lower Body Dressing/Undressing What is the patient wearing?: Pants, Socks, Shoes, Underwear, AFO Position: Wheelchair/chair at sink Underwear - Performed by patient: Thread/unthread right underwear leg, Thread/unthread left underwear leg, Pull underwear up/down Pants- Performed by patient: Thread/unthread right pants leg, Thread/unthread left pants leg, Pull pants up/down Pants- Performed by helper: Pull pants up/down Non-skid slipper socks- Performed by patient: Don/doff right sock, Don/doff left sock Non-skid slipper socks- Performed by helper: Don/doff right sock, Don/doff left sock(don) Socks - Performed by patient: Don/doff right sock, Don/doff left sock Shoes - Performed by patient: Don/doff right shoe, Don/doff left shoe, Fasten right, Fasten left AFO - Performed by patient: Don/doff right AFO, Don/doff left AFO Assist for footwear: Supervision/touching assist Assist for lower body dressing: Supervision or verbal cues  Function - Toileting Toileting activity did not occur: No continent bowel/bladder event Toileting steps completed by patient: Adjust clothing prior to toileting, Performs perineal hygiene, Adjust clothing after toileting Toileting steps completed by helper: Adjust clothing prior to toileting, Adjust clothing after toileting Toileting Assistive Devices: Grab bar or rail Assist level: Touching or steadying assistance (Pt.75%)  Function - Archivist transfer assistive device: Grab bar, Walker Assist  level to toilet: Supervision or verbal cues Assist level from toilet: Supervision or verbal cues Assist level to  bedside commode (at bedside): Touching or steadying assistance (Pt > 75%) Assist level from bedside commode (at bedside): Touching or steadying assistance (Pt > 75%)  Function - Chair/bed transfer Chair/bed transfer method: Stand pivot, Ambulatory Chair/bed transfer assist level: Supervision or verbal cues Chair/bed transfer assistive device: Armrests, Walker, Orthosis Chair/bed transfer details: Verbal cues for technique, Manual facilitation for placement, Manual facilitation for weight shifting, Verbal cues for precautions/safety  Function - Locomotion: Wheelchair Will patient use wheelchair at discharge?: (TBD) Type: Manual Max wheelchair distance: 125 ft Assist Level: Supervision or verbal cues Assist Level: Supervision or verbal cues Wheel 150 feet activity did not occur: Safety/medical concerns Assist Level: Supervision or verbal cues Turns around,maneuvers to table,bed, and toilet,negotiates 3% grade,maneuvers on rugs and over doorsills: No Function - Locomotion: Ambulation Ambulation activity did not occur: Safety/medical concerns Assistive device: Walker-rolling, Orthosis Max distance: 110 ft Assist level: Supervision or verbal cues Walk 10 feet activity did not occur: Safety/medical concerns Assist level: Supervision or verbal cues Walk 50 feet with 2 turns activity did not occur: Safety/medical concerns Assist level: Supervision or verbal cues Walk 150 feet activity did not occur: Safety/medical concerns Walk 10 feet on uneven surfaces activity did not occur: Safety/medical concerns  Function - Comprehension Comprehension: Auditory Comprehension assist level: Follows complex conversation/direction with extra time/assistive device  Function - Expression Expression: Verbal Expression assist level: Expresses complex ideas: With extra time/assistive  device  Function - Social Interaction Social Interaction assist level: Interacts appropriately with others with medication or extra time (anti-anxiety, antidepressant).  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 DVT Prophylaxis/Anticoagulation: Subcutaneous heparin.   Small isolated asymptomatic left peroneal  DVT, stable on repeat study on 2/8 and on 2/19 no further studies needed, no anticoag except for ASA 3. Pain Management:Hydrocodone as needed. -schedule tylenol bid -Neuropathic foot pain start gabapentin 100mg  TID 2/13, added cymbalta 20mg  on 2/13,    -Increase gabapentin to 200 mg 3 times daily 2/17, will discharge on current dose    -contCymbalta will discharge on current dose 4. Mood:Ativan 0.5 mg every 4 hours as needed.Provide emotional support 5. Neuropsych: This patientiscapable of making decisions on herown behalf. 6. Skin/Wound Care:Routine skin checks 7. Fluids/Electrolytes/Nutrition:Routine I&O's 8.Severe COPD with tobacco abuse. Continue nebulizers. Provide counseling regarding cessation of nicotine products.NicoDerm patch as well as prednisone taper, No SOB    10. S/p Trach: decannulated , 11.  Leukocytosis likely steroid related, afebrile, off po and on corticosteroid nebs   WBCs 14.5 on 2/11      12.  Vaginal itching some improvement no rash in peri area, has received Diflucan x 1 which is usual dose for vaginal candidiasis  13. Acute blood loss anemia   Hemoglobin 11.5 on 2/11   Continue to monitor 14. Hypoalbuminemia   Supplement initiated on 2/9  15. Hypertension   Continue Cardizem    Vitals:   11/16/17 0409 11/16/17 0759  BP: 138/60   Pulse: 88 94  Resp: 17 16  Temp: 97.6 F (36.4 C)   SpO2: 93% 94%  controlled 2/21  LOS (Days) 20 A FACE TO FACE EVALUATION WAS PERFORMED  Erick Colace 11/16/2017, 8:40 AM

## 2017-11-16 NOTE — Progress Notes (Addendum)
Social Work Discharge Note  The overall goal for the admission was met for:   Discharge location: Yes - home with family  Length of Stay: Yes - 21 days  Discharge activity level: Yes - overall supervision  Home/community participation: Yes  Services provided included: MD, RD, PT, OT, SLP, RN, Pharmacy, Neuropsych and SW  Financial Services: Private Insurance: [employer]  Follow-up services arranged: Home Health: PT/OT, DME: 18"x16" hemi-height, lightweight w/c with anti-tippers and brake extenders and basic cushion; rolling walker and Patient/Family request agency HH: [facility], but they cannot provide OT right now, so set pt up with [facility], DME: [place] (or additional information): Pt to return home with family to provide supervision and min A as needed.  Pt feel ready to return home.  Pt's husband came to PT session yesterday for family education and PT feels comfortable with him providing pt the necessary assistance.  Pt can also direct family in her care.  Patient/Family verbalized understanding of follow-up arrangements: Yes  Individual responsible for coordination of the follow-up plan: pt with support and supervision from her husband and dtr  Confirmed correct DME delivered: Alexanderia Gorby, Silvestre Mesi 11/16/2017    Bronson Bressman, Silvestre Mesi

## 2017-11-16 NOTE — Progress Notes (Signed)
Physical Therapy Session Note  Patient Details  Name: Sally [name] MRN: [id number] Date of Birth: 12/01/1950  Today's Date: 11/15/2017 PT Individual Time: 1300-1415   75 min   Short Term Goals: Week 3:  PT Short Term Goal 2 (Week 3): STG =LTG due to ELOS  Skilled Therapeutic Interventions/Progress Updates:   Pt received sitting in WC and agreeable to PT  Pt propelled WC to day room without cues or assist from PT. Stand pivot transfer to Nustep with supervision assist. nustep reciprocal movement training x 8 min at level 4. No rest break needed. Pt reports Borg RPE at 13/20.   PT instructed pt in sit<>stand transfers with supervision assist throughout treatment with UE support on Arm rests. Standing tolerance/dynamic standing balance training with Wii fit to improve weight shifting in all four planes. Penguin slide x 3, No UE support Table tile x 3 with intermittent 0-2 UE support. Pt noted to reach for RW when attempting posterior or anterior weight shift.   Gait training x 51f with supervision assist and RW. Min cues for posture and step length.   Pt instructed by PT in stair negotiation x 4 steps with 1 rail on the L to simulate home set up. Min assist from PT with moderate cues for technique to improve safety.   .[facility] and all needs met.        Therapy Documentation Precautions:  Precautions Precautions: Fall Restrictions Weight Bearing Restrictions: No    Vital Signs: Therapy Vitals Temp: 97.6 F (36.4 C) Temp Source: Oral Pulse Rate: 88 Resp: 17 BP: 138/60 Patient Position (if appropriate): Lying Oxygen Therapy SpO2: 93 % O2 Device: Not Delivered Pain:   denies.   See Function Navigator for Current Functional Status.   Therapy/Group: Individual Therapy  ALorie Phenix2/21/2019, 5:44 AM

## 2017-11-16 NOTE — Progress Notes (Signed)
Occupational Therapy Discharge Summary  Patient Details  Name: Sally [name] MRN: [id number] Date of Birth: 1950-10-13   Patient has met 9 of 9 long term goals due to improved activity tolerance, improved balance, postural control and improved coordination.  Patient to discharge at overall Supervision level.  Patient's care partner is independent to provide the necessary physical assistance at discharge.  Formal family education not completed from OT. Pt is aware of deficits and is able to direct caregiver for any needed assist. Recommend use of tub transfer bench for bathing tasks.  Cont to provide education and encouragement for pt to wear PRAFO boots at night due to excessive foot drop, she has been non-compliant with wearing during rehab admission. She has B AFOs for shoes. Pt voices feeling comfortable and confident for planned d/c.   Recommendation:  Patient will benefit from ongoing skilled OT services in home health setting to continue to advance functional skills in the area of BADL, iADL and Reduce care partner burden.  Equipment: Tub transfer bench, pt has BSC  Reasons for discharge: treatment goals met and discharge from hospital  Patient/family agrees with progress made and goals achieved: Yes  OT Discharge Precautions/Restrictions  Precautions Precautions: Fall Precaution Comments: B foot drop Restrictions Weight Bearing Restrictions: No Vision Baseline Vision/History: Wears glasses Wears Glasses: Reading only Patient Visual Report: No change from baseline Vision Assessment?: No apparent visual deficits Perception  Perception: Within Functional Limits Praxis Praxis: Intact Cognition Overall Cognitive Status: Within Functional Limits for tasks assessed Arousal/Alertness: Awake/alert Orientation Level: Oriented X4 Focused Attention: Appears intact Sustained Attention: Appears intact Memory: Appears intact Awareness: Appears intact Problem Solving: Appears  intact Safety/Judgment: Appears intact Sensation Sensation Light Touch: Appears Intact Proprioception: Appears Intact Coordination Gross Motor Movements are Fluid and Coordinated: No Fine Motor Movements are Fluid and Coordinated: Yes Coordination and Movement Description: poor control of BLE due to foot drop.  Motor  Motor Motor: Other (comment) Motor - Discharge Observations: generalized weakness with foot drop bilaterally, but significantly improved from Eval.  Mobility  Bed Mobility Bed Mobility: Rolling Right;Rolling Left;Supine to Sit;Sit to Supine Rolling Right: 6: Modified independent (Device/Increase time) Rolling Left: 6: Modified independent (Device/Increase time) Supine to Sit: 6: Modified independent (Device/Increase time) Sit to Supine: 6: Modified independent (Device/Increase time) Transfers Sit to Stand: 6: Modified independent (Device/Increase time)  Trunk/Postural Assessment  Cervical Assessment Cervical Assessment: Exceptions to WFL(forward head) Thoracic Assessment Thoracic Assessment: Exceptions to WFL(Kyphotic; Rounded shoulders) Lumbar Assessment Lumbar Assessment: Exceptions to WFL(Posterior pelvic tilt) Postural Control Postural Control: Within Functional Limits  Balance Balance Balance Assessed: Yes Static Sitting Balance Static Sitting - Level of Assistance: 6: Modified independent (Device/Increase time) Dynamic Sitting Balance Dynamic Sitting - Balance Support: During functional activity Dynamic Sitting - Level of Assistance: 6: Modified independent (Device/Increase time) Sitting balance - Comments: Sitting EOB to dress Static Standing Balance Static Standing - Balance Support: During functional activity;No upper extremity supported Static Standing - Level of Assistance: 5: Stand by assistance Dynamic Standing Balance Dynamic Standing - Balance Support: During functional activity;No upper extremity supported Dynamic Standing - Level of  Assistance: 5: Stand by assistance Dynamic Standing - Comments: Standing to complete grooming tasks Extremity/Trunk Assessment RUE Assessment RUE Assessment: Within Functional Limits(Generalized weakness but WFL) LUE Assessment LUE Assessment: Within Functional Limits(Generalized weakness but [facility])   See Function Navigator for Current Functional Status.  Rounds, Amy L 11/16/2017, 12:30 PM

## 2017-11-16 NOTE — Progress Notes (Signed)
Physical Therapy Discharge Summary  Patient Details  Name: Sally [name] MRN: [id number] Date of Birth: [birth year]  Today's Date: 11/16/2017 PT Individual Time:  -       Patient has met 7 of 7 long term goals due to improved activity tolerance, improved balance, increased strength, increased range of motion, decreased pain and functional use of  right lower extremity and left lower extremity.  Patient to discharge at an ambulatory level Supervision.   Patient's care partner is independent to provide the necessary physical assistance at discharge.  Reasons goals not met: all PT goals met.   Recommendation:  Patient will benefit from ongoing skilled PT services in home health setting to continue to advance safe functional mobility, address ongoing impairments in strength, endurance, strength, gait, and minimize fall risk.  Equipment: WC and RW  Reasons for discharge: treatment goals met and discharge from hospital  Patient/family agrees with progress made and goals achieved: Yes   PT treatment:  Pt received sitting in WC and agreeable to PT PT instructed pt in Grad day assessment to measure progress toward goals. See below for details. Pt's husband present for family education; instructed safe guarding and cues for gait, stair management with 1 rail, and car transfer. Patient returned to room and left sitting in [facility] with call bell in reach and all needs met.    Session 2.   Pt instructed in Walker mobility throughout rehab unit without assist from PT x 284f . Dynamic balance training and standing tolerance x 30 min to play wii sports bowling and tennis. Distant supervision assist from PT for safety and 1 UE support on RW. [facility] and all needs met.      PT Discharge Precautions/Restrictions   Vital Signs Therapy Vitals Pulse Rate: 94 Resp: 16 Patient Position (if appropriate): Lying Oxygen Therapy SpO2: 94 % O2  Device: Not Delivered Pain Pain Assessment Pain Assessment: 0-10 Pain Score: 4  Pain Type: Neuropathic pain Pain Location: Leg Pain Orientation: Right;Left Pain Radiating Towards: right leg and foot Pain Descriptors / Indicators: Aching;Throbbing Pain Frequency: Constant Pain Onset: On-going Pain Intervention(s): Medication (See eMAR) 2nd Pain Site Pain Score: 6 Pain Type: Acute pain Pain Location: Head Pain Orientation: Right;Lateral Pain Descriptors / Indicators: Headache Pain Intervention(s): Medication (See eMAR) Vision/Perception  Perception Perception: Within Functional Limits Praxis Praxis: Intact  Cognition Orientation Level: Oriented X4 Sensation Sensation Light Touch: Appears Intact Proprioception: Appears Intact Coordination Gross Motor Movements are Fluid and Coordinated: No Fine Motor Movements are Fluid and Coordinated: Yes Coordination and Movement Description: poor control of BLE due to foot drop.  Motor  Motor Motor: Other (comment) Motor - Discharge Observations: generalized weakness with foot drop bilaterally, but significantly improved from Eval.   Mobility Bed Mobility Bed Mobility: Rolling Right;Rolling Left;Supine to Sit;Sit to Supine Rolling Right: 6: Modified independent (Device/Increase time) Rolling Left: 6: Modified independent (Device/Increase time) Supine to Sit: 6: Modified independent (Device/Increase time) Sit to Supine: 6: Modified independent (Device/Increase time) Transfers Transfers: Yes Sit to Stand: 6: Modified independent (Device/Increase time) Stand Pivot Transfers: 5: Supervision Locomotion  Ambulation Ambulation: Yes Ambulation/Gait Assistance: 5: Supervision Ambulation Distance (Feet): 125 Feet Assistive device: Rolling walker Gait Gait: Yes Gait Pattern: Decreased stride length Stairs / Additional Locomotion Stairs: Yes Stairs Assistance: 4: Min assist Stairs Assistance Details: Manual facilitation for weight  shifting;Manual facilitation for placement Number of Stairs: 4 Height of Stairs: 6 Wheelchair Mobility Wheelchair Mobility:  Yes Wheelchair Assistance: 6: Modified independent (Device/Increase time) Environmental health practitioner: Both upper extremities Wheelchair Parts Management: Independent Distance: 126f   Trunk/Postural Assessment  Cervical Assessment Cervical Assessment: Exceptions to WFL(forward head) Thoracic Assessment Thoracic Assessment: Exceptions to WFL(Kyphotic; Rounded shoulders) Lumbar Assessment Lumbar Assessment: Exceptions to WFL(Posterior pelvic tilt) Postural Control Postural Control: Within Functional Limits  Balance Balance Balance Assessed: Yes Dynamic Sitting Balance Dynamic Sitting - Balance Support: During functional activity Dynamic Sitting - Level of Assistance: 6: Modified independent (Device/Increase time) Sitting balance - Comments: Sitting EOB to dress Static Standing Balance Static Standing - Balance Support: During functional activity;No upper extremity supported Static Standing - Level of Assistance: 5: Stand by assistance Dynamic Standing Balance Dynamic Standing - Balance Support: During functional activity;No upper extremity supported Dynamic Standing - Level of Assistance: 5: Stand by assistance Extremity Assessment      RLE Assessment RLE Assessment: Ex[facility] to [facility] (degrees) RLE Overall AROM Comments: lacking 85% full DF RLE Strength RLE Overall Strength Comments: hip flexion, knee flexion, hip adduction, hip adduction: 4/5. knee extension: 4+/5. ankle DF: 2/5.  LLE Assessment LLE Assessment: Exceptions to WFL LLE AROM (degrees) LLE Overall AROM Comments: lacking 70% full DF LLE Strength LLE Overall Strength Comments: hip flexion, knee flexion, hip adduction, hip adduction: 4/5. knee extension: 4+/5. ankle DF: 2+/5.    See Function Navigator for Current Functional Status.  Sally Phenix2/21/2019, 11:58 AM

## 2017-11-17 MED ORDER — HYDROCODONE-ACETAMINOPHEN 7.5-325 MG PO TABS: 1.0000 | ORAL_TABLET | Freq: Four times a day (QID) | ORAL | 0 refills | Status: DC | PRN

## 2017-11-17 MED ORDER — FAMOTIDINE 20 MG PO TABS: 20.0000 mg | ORAL_TABLET | Freq: Two times a day (BID) | ORAL | 0 refills | Status: DC

## 2017-11-17 MED ORDER — METHOCARBAMOL 500 MG PO TABS: 500.0000 mg | ORAL_TABLET | Freq: Four times a day (QID) | ORAL | 0 refills | Status: DC | PRN

## 2017-11-17 MED ORDER — GABAPENTIN 100 MG PO CAPS: 200.0000 mg | ORAL_CAPSULE | Freq: Three times a day (TID) | ORAL | 0 refills | Status: DC

## 2017-11-17 MED ORDER — NICOTINE 21 MG/24HR TD PT24: MEDICATED_PATCH | TRANSDERMAL | 0 refills | Status: AC

## 2017-11-17 MED ORDER — DULOXETINE HCL 20 MG PO CPEP: 20.0000 mg | ORAL_CAPSULE | Freq: Every day | ORAL | 3 refills | Status: DC

## 2017-11-17 MED ORDER — ANORO ELLIPTA 62.5-25 MCG/INH IN AEPB: 1.0000 | INHALATION_SPRAY | Freq: Every day | RESPIRATORY_TRACT | 11 refills | Status: AC

## 2017-11-17 MED ORDER — DILTIAZEM HCL 30 MG PO TABS: 30.0000 mg | ORAL_TABLET | Freq: Three times a day (TID) | ORAL | 0 refills | Status: AC

## 2017-11-17 MED ORDER — FLOVENT HFA 110 MCG/ACT IN AERO: 2.0000 | INHALATION_SPRAY | Freq: Two times a day (BID) | RESPIRATORY_TRACT | 11 refills | Status: AC

## 2017-11-17 MED ORDER — ALBUTEROL SULFATE HFA 108 (90 BASE) MCG/ACT IN AERS: 1.0000 | INHALATION_SPRAY | Freq: Four times a day (QID) | RESPIRATORY_TRACT | 0 refills | Status: AC | PRN

## 2017-11-17 NOTE — Progress Notes (Signed)
Subjective/Complaints: No calf pain, Right foot pain improved, able to sleep and participate in therapy  ROS: pt denies nausea, vomiting, diarrhea, cough, shortness of breath or chest pain      Objective: Vital Signs: Blood pressure (!) 137/55, pulse 87, temperature 97.6 F (36.4 C), temperature source Oral, resp. rate 17, height 5\' 2"  (1.575 m), weight 73.2 kg (161 lb 6 oz), SpO2 96 %.  No results found for this or any previous visit (from the past 72 hour(s)).   HEENT: Neck clean trach site healed  Cardio: RRR without murmur. No JVD    Resp: CTA Bilaterally without wheezes or rales. Normal effort  GI: BS positive and nondistended Musculoskeletal:  No Edema, no tenderness in extremities Skin:   Intact. Warm and dry. Neuro: Alert/Oriented,  Motor: Bilateral upper extremities 4/5 proximal to distal Bilateral lower extremity is: 4-/5 hip flexion, knee extension, 2 -/5 ankle dorsi/plantar flexion (unchanged) Mild ankle edema bilateral , no foot or toe erythema no lesions, no synovitis, + tenderness to light palpation over the entire R foot General no acute distress.   Pleasant but anxiousPsych :   Assessment/Plan: 1. 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 Stable for D/C today F/u PCP in 3-4 weeks F/u PM&R 2 weeks See D/C summary See D/C instructions FIM: Function - Bathing Bathing activity did not occur: Refused Position: Shower Body parts bathed by patient: Right arm, Left arm, Chest, Abdomen, Right upper leg, Left upper leg, Right lower leg, Left lower leg, Front perineal area, Buttocks, Back Body parts bathed by helper: Back Bathing not applicable: Right lower leg, Left lower leg, Right upper leg, Left upper leg, Front perineal area, Buttocks(Declined LB) Assist Level: Supervision or verbal cues  Function- Upper Body Dressing/Undressing Upper body dressing/undressing activity did not occur: Refused What is the patient wearing?: Pull  over shirt/dress Pull over shirt/dress - Perfomed by patient: Thread/unthread right sleeve, Thread/unthread left sleeve, Put head through opening, Pull shirt over trunk Assist Level: No help, No cues Set up : To obtain clothing/put away Function - Lower Body Dressing/Undressing What is the patient wearing?: Pants, Socks, Shoes, Underwear, AFO Position: Wheelchair/chair at sink Underwear - Performed by patient: Thread/unthread right underwear leg, Thread/unthread left underwear leg, Pull underwear up/down Pants- Performed by patient: Thread/unthread right pants leg, Thread/unthread left pants leg, Pull pants up/down Pants- Performed by helper: Pull pants up/down Non-skid slipper socks- Performed by patient: Don/doff right sock, Don/doff left sock Non-skid slipper socks- Performed by helper: Don/doff right sock, Don/doff left sock(don) Socks - Performed by patient: Don/doff right sock, Don/doff left sock Shoes - Performed by patient: Don/doff right shoe, Don/doff left shoe, Fasten right, Fasten left AFO - Performed by patient: Don/doff right AFO, Don/doff left AFO Assist for footwear: Supervision/touching assist(LH shoe horn) Assist for lower body dressing: Supervision or verbal cues  Function - Toileting Toileting activity did not occur: No continent bowel/bladder event Toileting steps completed by patient: Adjust clothing prior to toileting, Performs perineal hygiene, Adjust clothing after toileting Toileting steps completed by helper: Adjust clothing prior to toileting, Adjust clothing after toileting Toileting Assistive Devices: Grab bar or rail Assist level: Supervision or verbal cues  Function - Archivist transfer assistive device: Grab bar, Walker Assist level to toilet: Supervision or verbal cues Assist level from toilet: Supervision or verbal cues Assist level to bedside commode (at bedside): Touching or steadying assistance (Pt > 75%) Assist level from bedside  commode (at bedside): Touching or steadying assistance (Pt > 75%)  Function - Chair/bed transfer Chair/bed transfer method: Stand pivot Chair/bed transfer assist level: Supervision or verbal cues Chair/bed transfer assistive device: Walker, Armrests Chair/bed transfer details: Verbal cues for technique, Manual facilitation for placement, Manual facilitation for weight shifting, Verbal cues for precautions/safety  Function - Locomotion: Wheelchair Will patient use wheelchair at discharge?: Yes Type: Manual Max wheelchair distance: 26ft  Assist Level: No help, No cues, assistive device, takes more than reasonable amount of time Assist Level: No help, No cues, assistive device, takes more than reasonable amount of time Wheel 150 feet activity did not occur: Safety/medical concerns Assist Level: No help, No cues, assistive device, takes more than reasonable amount of time Turns around,maneuvers to table,bed, and toilet,negotiates 3% grade,maneuvers on rugs and over doorsills: No Function - Locomotion: Ambulation Ambulation activity did not occur: Safety/medical concerns Assistive device: Walker-rolling Max distance: 125 Assist level: Supervision or verbal cues Walk 10 feet activity did not occur: Safety/medical concerns Assist level: Supervision or verbal cues Walk 50 feet with 2 turns activity did not occur: Safety/medical concerns Assist level: Supervision or verbal cues Walk 150 feet activity did not occur: Safety/medical concerns Walk 10 feet on uneven surfaces activity did not occur: Safety/medical concerns  Function - Comprehension Comprehension: Auditory Comprehension assist level: Follows complex conversation/direction with extra time/assistive device  Function - Expression Expression: Verbal Expression assist level: Expresses complex ideas: With extra time/assistive device  Function - Social Interaction Social Interaction assist level: Interacts appropriately with others  with medication or extra time (anti-anxiety, antidepressant).  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 DVT Prophylaxis/Anticoagulation: Subcutaneous heparin.   Small isolated asymptomatic left peroneal  DVT, stable on repeat study on 2/8 and on 2/19 no further studies needed, no anticoag except for ASA 3. Pain Management:Hydrocodone as needed. -schedule tylenol bid -Neuropathic foot pain start gabapentin 100mg  TID 2/13, added cymbalta 20mg  on 2/13,    -Increase gabapentin to 200 mg 3 times daily 2/17, will discharge on current dose    -contCymbalta will discharge on current dose 4. Mood:Ativan 0.5 mg every 4 hours as needed.Provide emotional support 5. Neuropsych: This patientiscapable of making decisions on herown behalf. 6. Skin/Wound Care:Routine skin checks 7. Fluids/Electrolytes/Nutrition:Routine I&O's 8.Severe COPD with tobacco abuse. Continue nebulizers. Provide counseling regarding cessation of nicotine products.NicoDerm patch as well as prednisone taper, No SOB    10. S/p Trach: decannulated , 11.  Leukocytosis likely steroid related, afebrile, off po and on corticosteroid nebs   WBCs 14.5 on 2/11     12.  Vaginal itching some improvement no rash in peri area, has received Diflucan x 1 which is usual dose for  vaginal candidiasis  13. Acute blood loss anemia   Hemoglobin 11.5 on 2/11   Continue to monitor 14. Hypoalbuminemia   Supplement initiated on 2/9  15. Hypertension   Continue Cardizem    Vitals:   11/16/17 2116 11/17/17 0334  BP: (!) 145/72 (!) 137/55  Pulse:  87  Resp:  17  Temp:  97.6 F (36.4 C)  SpO2:  96%  controlled 2/22  LOS (Days) 21 A FACE TO FACE EVALUATION WAS PERFORMED  Erick Colace 11/17/2017, 7:42 AM

## 2017-11-17 NOTE — Progress Notes (Signed)
Pt d/c to home with belongings, son at bedside. Discharge instructions provided to pt by Deatra Ina PA.  All questions and concerns answered with no questions at this time.

## 2017-11-20 ENCOUNTER — Telehealth: Payer: Self-pay | Admitting: *Deleted

## 2017-11-20 NOTE — Telephone Encounter (Signed)
Transitional care call, 1st attempt, tried home number listed, left voicemail to call us back to confirm transitional care appointment.  Transitional care call, 2nd attempt, tried number listed for son, left voicemail to call us back to confirm transitional care appointment.

## 2017-11-22 NOTE — Telephone Encounter (Signed)
Transitional Care Call attempt: Placed a call to Ms. Sally [name], and her son. Left message to return the call.

## 2017-11-23 MED ORDER — HYDROCODONE-ACETAMINOPHEN 7.5-325 MG PO TABS: 1.0000 | ORAL_TABLET | Freq: Four times a day (QID) | ORAL | 0 refills | Status: DC | PRN

## 2017-11-23 NOTE — Telephone Encounter (Signed)
Transitional Care call Transitional Care Call Completed, Appointment Confirmed, Address Confirmed, New Patient Packet Mailed.  Patient name: Sally [name]                                     DOB: [birth year] 1. Are you/is patient experiencing any problems since coming home? No a. Are there any questions regarding any aspect of care? No 2. Are there any questions regarding medications administration/dosing? No a. Are meds being taken as prescribed? Yes b. "Patient should review meds with caller to confirm"  Medication List Reviewed 3. Have there been any falls? No 4. Has Home Health been to the house and/or have they contacted you? [name] has spoken to [facility], appointment has been scheduled. a. If not, have you tried to contact them? NA b. Can we help you contact them? NA 5. Are bowels and bladder emptying properly? Yes a. Are there any unexpected incontinence issues? No b. If applicable, is patient following bowel/bladder programs? NA 6. Any fevers, problems with breathing, unexpected pain? No: She is currently prescribed Hydrocodone for lower back pain and lower extremities: According to PMP Hydrocodone picked up on 11/17/2017, MME 5.0. We will refill today, she verbalizes understanding.  7. Are there any skin problems or new areas of breakdown? No 8. Has the patient/family member arranged specialty MD follow up (ie cardiology/neurology/renal/surgical/etc.)?  Yes a. Can we help arrange? No 9. Does the patient need any other services or support that we can help arrange? No 10. Are caregivers following through as expected in assisting the patient? Yes 11. Has the patient quit smoking, drinking alcohol, or using drugs as recommended? [name] denies smoking, she quit 09/26/2017, denies drinking alcohol or use of illicit drugs.   Appointment date/time 12/04/2017  arrival time 12:40 for 1:00 pm with Jacalyn Lefevre ANP-C at [address]

## 2017-12-01 ENCOUNTER — Telehealth: Payer: Self-pay | Admitting: Registered Nurse

## 2017-12-01 ENCOUNTER — Telehealth: Payer: Self-pay

## 2017-12-01 MED ORDER — GABAPENTIN 100 MG PO CAPS: ORAL_CAPSULE | ORAL | 2 refills | Status: DC

## 2017-12-01 MED ORDER — HYDROCODONE-ACETAMINOPHEN 7.5-325 MG PO TABS: 1.0000 | ORAL_TABLET | Freq: Four times a day (QID) | ORAL | 0 refills | Status: DC | PRN

## 2017-12-01 NOTE — Telephone Encounter (Signed)
Placed another call to Sally [name], voicemail is not set up. We will have to wait till she calls our office, to obtain how frequent she is taking the hydrocodone. She has a scheduled appointment on 12/04/2017.

## 2017-12-01 NOTE — Telephone Encounter (Signed)
Placed a call to Sally [name], no answer. Left message to return the call.

## 2017-12-01 NOTE — Telephone Encounter (Signed)
Patient called today requesting a refill of hydrocodone.  Last refill of the medication was on 11-23-2017.

## 2017-12-01 NOTE — Telephone Encounter (Signed)
Gabapentin sent to pharmacy

## 2017-12-01 NOTE — Telephone Encounter (Signed)
Return Sally [name] call, she reports leg pain and increase frequency and intensity of neuropathic pain in her foot. Hydrocodone e-scribed and Gabapentin instructions gine two capsules in the morning, late afternoon and at bedtime. She verbalizes understanding. Encouraged to keep her scheduled appointment on 12/04/2017, she verbalizes understanding.

## 2017-12-04 ENCOUNTER — Encounter: Payer: Medicare Other | Attending: Registered Nurse | Admitting: Registered Nurse

## 2017-12-04 ENCOUNTER — Other Ambulatory Visit: Payer: Self-pay

## 2017-12-04 ENCOUNTER — Encounter: Payer: Self-pay | Admitting: Registered Nurse

## 2017-12-04 VITALS — BP 171/82 | HR 83

## 2017-12-04 DIAGNOSIS — Z90710 Acquired absence of both cervix and uterus: Secondary | ICD-10-CM | POA: Insufficient documentation

## 2017-12-04 DIAGNOSIS — Z9841 Cataract extraction status, right eye: Secondary | ICD-10-CM | POA: Diagnosis not present

## 2017-12-04 DIAGNOSIS — G7281 Critical illness myopathy: Secondary | ICD-10-CM | POA: Insufficient documentation

## 2017-12-04 DIAGNOSIS — Z5181 Encounter for therapeutic drug level monitoring: Secondary | ICD-10-CM | POA: Diagnosis not present

## 2017-12-04 DIAGNOSIS — M79604 Pain in right leg: Secondary | ICD-10-CM | POA: Diagnosis not present

## 2017-12-04 DIAGNOSIS — Z9842 Cataract extraction status, left eye: Secondary | ICD-10-CM | POA: Diagnosis not present

## 2017-12-04 DIAGNOSIS — G6281 Critical illness polyneuropathy: Secondary | ICD-10-CM | POA: Insufficient documentation

## 2017-12-04 DIAGNOSIS — Z87891 Personal history of nicotine dependence: Secondary | ICD-10-CM | POA: Insufficient documentation

## 2017-12-04 DIAGNOSIS — M79672 Pain in left foot: Secondary | ICD-10-CM | POA: Diagnosis not present

## 2017-12-04 DIAGNOSIS — K5090 Crohn's disease, unspecified, without complications: Secondary | ICD-10-CM | POA: Insufficient documentation

## 2017-12-04 DIAGNOSIS — J449 Chronic obstructive pulmonary disease, unspecified: Secondary | ICD-10-CM | POA: Insufficient documentation

## 2017-12-04 DIAGNOSIS — I10 Essential (primary) hypertension: Secondary | ICD-10-CM | POA: Diagnosis not present

## 2017-12-04 DIAGNOSIS — Z79899 Other long term (current) drug therapy: Secondary | ICD-10-CM | POA: Insufficient documentation

## 2017-12-04 MED ORDER — GABAPENTIN 100 MG PO CAPS: ORAL_CAPSULE | ORAL | 2 refills | Status: DC

## 2017-12-04 MED ORDER — TRAMADOL HCL 50 MG PO TABS: 50.0000 mg | ORAL_TABLET | Freq: Two times a day (BID) | ORAL | 1 refills | Status: DC | PRN

## 2017-12-04 NOTE — Progress Notes (Signed)
Subjective:    Patient ID: Sally [name], female    DOB: 1951-05-10, 67 y.o.   MRN: [id number]  HPI: Sally [name] is a 67 year old female who is here for transitional care visit in follow up of her critical illness myopathy, critical illness neuropathy, COPD, right lower extremity and left foot pain. She is home with home health therapies with [facility]. Sally [name] states she has pain in her right lower extremity and left foot with tingling, burning and numbness. Her Hydrocodone will be discontinued and Tramadol will be prescribed, this was discussed with Dr. Wynn Banker. Dr. Wynn Banker would like for this provider to order bilateral lower extremities EMG, this was discussed with Sally [name], she verbalizes understanding.  She rates her pain 7. Her current exercise regime is walking with her walker.   Sally [name] arrived hypertensive, states she is compliant with her antihypertensive, also reports she is due for her cardizem.  Also denies and headaches, she's asymptomatic. She refuses ED or Urgent care evaluation, instructed to keep a blood pressure log and follow up with her PCP, she verbalizes understanding.   Husband in room all questions answered.   Pain Inventory Average Pain 7 Pain Right Now 7 My pain is burning, tingling and aching  In the last 24 hours, has pain interfered with the following? General activity 7 Relation with others 5 Enjoyment of life 0 What TIME of day is your pain at its worst? evening and night Sleep (in general) Fair  Pain is worse with: some activites Pain improves with: medication Relief from Meds: 5  Mobility walk with assistance use a walker how many minutes can you walk? 3-5 ability to climb steps?  yes do you drive?  no  Function retired I need assistance with the following:  meal prep, household duties and shopping  Neuro/Psych tingling trouble walking spasms  Prior Studies Any changes since last visit?  no  Physicians  involved in your care Any changes since last visit?  no   Family History  Problem Relation Age of Onset  . Hypertension Other    Social History   Socioeconomic History  . Marital status: Married    Spouse name: None  . Number of children: None  . Years of education: None  . Highest education level: None  Social Needs  . Financial resource strain: None  . Food insecurity - worry: None  . Food insecurity - inability: None  . Transportation needs - medical: None  . Transportation needs - non-medical: None  Occupational History  . None  Tobacco Use  . Smoking status: Former Games developer  . Smokeless tobacco: Former Neurosurgeon    Quit date: 09/26/2017  Substance and Sexual Activity  . Alcohol use: No  . Drug use: No  . Sexual activity: None  Other Topics Concern  . None  Social History Narrative  . None   Past Surgical History:  Procedure Laterality Date  . ABDOMINAL HYSTERECTOMY    . CATARACT EXTRACTION, BILATERAL    . FRACTURE SURGERY Left    ARM   Past Medical History:  Diagnosis Date  . Asthma   . Back pain   . COPD (chronic obstructive pulmonary disease) (HCC)   . Crohn disease (HCC)   . Hypertension    BP (!) 162/95   Pulse 83   SpO2 97%   Opioid Risk Score:   Fall Risk Score:  `1  Depression screen PHQ 2/9  Depression screen [facility] 2/9 12/04/2017  Decreased  Interest 0  Down, Depressed, Hopeless 0  PHQ - 2 Score 0  Altered sleeping 3  Tired, decreased energy 3  Change in appetite 0  Feeling bad or failure about yourself  0  Trouble concentrating 0  Moving slowly or fidgety/restless 0  Suicidal thoughts 0  PHQ-9 Score 6  Difficult doing work/chores Very difficult   Review of Systems  Constitutional: Negative.   HENT: Negative.   Eyes: Negative.   Respiratory: Positive for cough.   Cardiovascular: Negative.   Gastrointestinal: Negative.   Endocrine:       High blood sugars  Genitourinary: Negative.   Musculoskeletal: Positive for gait problem.        Spasms  Skin: Negative.   Allergic/Immunologic: Negative.   Neurological:       Tingling  Hematological: Negative.   Psychiatric/Behavioral: Negative.   All other systems reviewed and are negative.      Objective:   Physical Exam  Constitutional: She is oriented to person, place, and time. She appears well-developed and well-nourished.  HENT:  Head: Normocephalic and atraumatic.  Neck: Normal range of motion. Neck supple.  Cardiovascular: Normal rate and regular rhythm.  Pulmonary/Chest: Effort normal and breath sounds normal.  Musculoskeletal:  Normal Muscle Bulk and Muscle Testing Reveals: Upper Extremities: Full ROM and Muscle Strength 5/5 Lower Extremities: Full ROM and Muscle Strength 5/5 Right Lower Extremity Flexion Produces Pain into Extremity Arises from Table Slowly Using Walker For Support Gait is Steady with Normal Steps   Neurological: She is alert and oriented to person, place, and time.  Skin: Skin is warm and dry.  Psychiatric: She has a normal mood and affect.  Nursing note and vitals reviewed.         Assessment & Plan:  1. Critical Illness Myopathy/ Critical Illness Neuropathy: Scheduled for Bilateral Lower Extremities EMG/ Continue Home Therapy. 2. COPD: Pulmonology Following 3. HTN: Continue antihypertensive medication: Encouraged to keep blood pressure log. PCP Following.  4. Acute Right Lower Extremity and Left Foot Pain: RX: Tramadol: Hydrocodone Discontinued.   60 minutes of face to face patient care time was spent during this visit. All questions were encouraged and answered.  F/U with Dr. Wynn Banker

## 2017-12-04 NOTE — Patient Instructions (Signed)
Amlactin Lotion   Eucerin Lotion   Look for Generic

## 2017-12-07 LAB — DRUG TOX MONITOR 1 W/CONF, ORAL FLD
Amphetamines: NEGATIVE ng/mL (ref <10)
BENZODIAZEPINES: NEGATIVE ng/mL (ref <0.50)
BUPRENORPHINE: NEGATIVE ng/mL (ref <0.10)
Barbiturates: NEGATIVE ng/mL (ref <10)
Cocaine: NEGATIVE ng/mL (ref <5.0)
Codeine: NEGATIVE ng/mL (ref <2.5)
Cotinine: 117.7 ng/mL — ABNORMAL HIGH (ref <5.0)
Dihydrocodeine: 6.2 ng/mL — ABNORMAL HIGH (ref <2.5)
Fentanyl: NEGATIVE ng/mL (ref <0.10)
HYDROCODONE: 59.1 ng/mL — AB (ref <2.5)
HYDROMORPHONE: NEGATIVE ng/mL (ref <2.5)
Heroin Metabolite: NEGATIVE ng/mL (ref <1.0)
MARIJUANA: NEGATIVE ng/mL (ref <2.5)
MDMA: NEGATIVE ng/mL (ref <10)
MEPROBAMATE: NEGATIVE ng/mL (ref <2.5)
METHADONE: NEGATIVE ng/mL (ref <5.0)
MORPHINE: NEGATIVE ng/mL (ref <2.5)
NORHYDROCODONE: 2.8 ng/mL — AB (ref <2.5)
NOROXYCODONE: NEGATIVE ng/mL (ref <2.5)
Nicotine Metabolite: POSITIVE ng/mL — AB (ref <5.0)
Opiates: POSITIVE ng/mL — AB (ref <2.5)
Oxycodone: NEGATIVE ng/mL (ref <2.5)
Oxymorphone: NEGATIVE ng/mL (ref <2.5)
Phencyclidine: NEGATIVE ng/mL (ref <10)
TAPENTADOL: NEGATIVE ng/mL (ref <5.0)
Tramadol: NEGATIVE ng/mL (ref <5.0)
ZOLPIDEM: NEGATIVE ng/mL (ref <5.0)

## 2017-12-07 LAB — DRUG TOX ALC METAB W/CON, ORAL FLD: ALCOHOL METABOLITE: NEGATIVE ng/mL (ref <25)

## 2017-12-13 ENCOUNTER — Telehealth: Payer: Self-pay | Admitting: *Deleted

## 2017-12-13 NOTE — Telephone Encounter (Signed)
Oral swab drug screen was consistent for prescribed medications.  ?

## 2017-12-15 ENCOUNTER — Telehealth: Payer: Self-pay | Admitting: *Deleted

## 2017-12-15 MED ORDER — METHOCARBAMOL 500 MG PO TABS: 500.0000 mg | ORAL_TABLET | Freq: Four times a day (QID) | ORAL | 0 refills | Status: DC | PRN

## 2017-12-15 MED ORDER — FAMOTIDINE 20 MG PO TABS: 20.0000 mg | ORAL_TABLET | Freq: Two times a day (BID) | ORAL | 0 refills | Status: AC

## 2017-12-15 NOTE — Telephone Encounter (Signed)
Sally [name] called and says she needs refills on her methocarbamol, cymbalta, and famotidine.

## 2017-12-15 NOTE — Telephone Encounter (Signed)
Medication refill, no refill PCP to follow

## 2018-01-01 ENCOUNTER — Ambulatory Visit: Payer: Medicare Other | Admitting: Physical Medicine & Rehabilitation

## 2018-01-03 ENCOUNTER — Telehealth: Payer: Self-pay

## 2018-01-03 NOTE — Telephone Encounter (Signed)
We can address at that time

## 2018-01-03 NOTE — Telephone Encounter (Signed)
Recieved faxed letter from patients insurance company stating that her current prescription of Methocarbamol is NOT currently on patients formulary of medications and is asking if the prescription can be changed to either:  Cyclobenzaprine (5,10mg ) Flurbiprofen Ibuprofen Naproxen Tizanidine  Patient is due to be seen at 3pm on Friday for a bilateral lower extremity EMG  Please advise.

## 2018-01-04 ENCOUNTER — Other Ambulatory Visit: Payer: Self-pay | Admitting: Registered Nurse

## 2018-01-05 ENCOUNTER — Ambulatory Visit: Payer: Medicare Other | Admitting: Physical Medicine & Rehabilitation

## 2018-01-05 ENCOUNTER — Encounter: Payer: Medicare Other | Attending: Registered Nurse

## 2018-01-05 ENCOUNTER — Encounter: Payer: Self-pay | Admitting: Physical Medicine & Rehabilitation

## 2018-01-05 VITALS — BP 143/72 | HR 105 | Ht 61.0 in | Wt 165.0 lb

## 2018-01-05 DIAGNOSIS — Z87891 Personal history of nicotine dependence: Secondary | ICD-10-CM | POA: Diagnosis not present

## 2018-01-05 DIAGNOSIS — J449 Chronic obstructive pulmonary disease, unspecified: Secondary | ICD-10-CM | POA: Insufficient documentation

## 2018-01-05 DIAGNOSIS — I10 Essential (primary) hypertension: Secondary | ICD-10-CM | POA: Insufficient documentation

## 2018-01-05 DIAGNOSIS — M79672 Pain in left foot: Secondary | ICD-10-CM | POA: Insufficient documentation

## 2018-01-05 DIAGNOSIS — M79604 Pain in right leg: Secondary | ICD-10-CM | POA: Insufficient documentation

## 2018-01-05 DIAGNOSIS — Z90710 Acquired absence of both cervix and uterus: Secondary | ICD-10-CM | POA: Diagnosis not present

## 2018-01-05 DIAGNOSIS — G7281 Critical illness myopathy: Secondary | ICD-10-CM | POA: Diagnosis present

## 2018-01-05 DIAGNOSIS — Z9841 Cataract extraction status, right eye: Secondary | ICD-10-CM | POA: Insufficient documentation

## 2018-01-05 DIAGNOSIS — Z9842 Cataract extraction status, left eye: Secondary | ICD-10-CM | POA: Insufficient documentation

## 2018-01-05 DIAGNOSIS — Z79899 Other long term (current) drug therapy: Secondary | ICD-10-CM | POA: Insufficient documentation

## 2018-01-05 DIAGNOSIS — G6281 Critical illness polyneuropathy: Secondary | ICD-10-CM | POA: Insufficient documentation

## 2018-01-05 DIAGNOSIS — K5090 Crohn's disease, unspecified, without complications: Secondary | ICD-10-CM | POA: Diagnosis not present

## 2018-01-05 DIAGNOSIS — R29898 Other symptoms and signs involving the musculoskeletal system: Secondary | ICD-10-CM

## 2018-01-05 MED ORDER — CYCLOBENZAPRINE HCL 5 MG PO TABS: 5.0000 mg | ORAL_TABLET | Freq: Three times a day (TID) | ORAL | 0 refills | Status: DC | PRN

## 2018-01-05 MED ORDER — HYDROCODONE-ACETAMINOPHEN 5-325 MG PO TABS: 1.0000 | ORAL_TABLET | Freq: Three times a day (TID) | ORAL | 0 refills | Status: DC | PRN

## 2018-01-05 NOTE — Progress Notes (Signed)
EMG NCV bilateral lower limbs was performed today for the indication of bilateral lower extremity weakness following critical illness.  Patient was hospitalized for 1 month in the ICU in January and spent the month of February in the inpatient rehabilitation unit.  She has persistent bilateral foot and ankle weakness as well as numbness in the right foot.  Please refer to full report scanned under media tab.  Briefly this is an abnormal study.  There is evidence of denervation as well as reinnervation in the right tibialis anterior on needle testing as well as in the left vastus medialis.  Bilateral sural sensory were markedly diminished in amplitude but distal latency relatively intact.  Peroneal motor measured at the EDB or absent bilaterally.  Tibial motor were present and normal bilaterally.  Findings most consistent with critical illness neuropathy.  A concommitant myopathy cannot be ruled out with electrodiagnostic testing.

## 2018-01-05 NOTE — Telephone Encounter (Signed)
patient has an appointment today 

## 2018-01-05 NOTE — Patient Instructions (Signed)
Critical illness neuropathy.  Will likely take about 1 yr before we know how much recovery you will have

## 2018-01-23 ENCOUNTER — Other Ambulatory Visit: Payer: Self-pay | Admitting: Registered Nurse

## 2018-01-23 NOTE — Telephone Encounter (Signed)
Recieved electronic medication refill request for famotidine, no mention in previous notes to continue or use this medication, unsure to refill.   Please advise.

## 2018-02-06 ENCOUNTER — Other Ambulatory Visit: Payer: Self-pay | Admitting: Registered Nurse

## 2018-02-06 ENCOUNTER — Other Ambulatory Visit: Payer: Self-pay | Admitting: Physical Medicine & Rehabilitation

## 2018-02-06 NOTE — Telephone Encounter (Signed)
Refill request for Tramadol. Next appt 07/09/18

## 2018-03-14 ENCOUNTER — Other Ambulatory Visit: Payer: Self-pay | Admitting: Registered Nurse

## 2018-03-28 ENCOUNTER — Other Ambulatory Visit: Payer: Self-pay | Admitting: Physical Medicine & Rehabilitation

## 2018-04-03 ENCOUNTER — Other Ambulatory Visit: Payer: Self-pay

## 2018-04-03 NOTE — Telephone Encounter (Signed)
Patient called requesting refill of Cymbalta, no mention on previous notes to refill this medication, please advise.

## 2018-04-04 MED ORDER — DULOXETINE HCL 20 MG PO CPEP: 20.0000 mg | ORAL_CAPSULE | Freq: Every day | ORAL | 3 refills | Status: DC

## 2018-05-23 ENCOUNTER — Other Ambulatory Visit: Payer: Self-pay | Admitting: Physical Medicine & Rehabilitation

## 2018-05-24 ENCOUNTER — Other Ambulatory Visit: Payer: Self-pay

## 2018-05-25 ENCOUNTER — Telehealth: Payer: Self-pay | Admitting: *Deleted

## 2018-05-25 MED ORDER — CYCLOBENZAPRINE HCL 5 MG PO TABS: 5.0000 mg | ORAL_TABLET | Freq: Three times a day (TID) | ORAL | 0 refills | Status: DC | PRN

## 2018-05-25 MED ORDER — CYCLOBENZAPRINE HCL 5 MG PO TABS: 5.0000 mg | ORAL_TABLET | Freq: Three times a day (TID) | ORAL | 1 refills | Status: DC | PRN

## 2018-05-25 NOTE — Telephone Encounter (Signed)
Okay to fill 90  tablets of cyclobenzaprine, will need another appointment before further refills

## 2018-05-25 NOTE — Telephone Encounter (Signed)
Sally [name] is asking for her cyclobenzaprine refill stating that she is only getting 10 day supply.  She takes it tid.  Is it ok to refill with #90 tablets instead of 30?  She says spasms are unbearable without it.

## 2018-05-25 NOTE — Telephone Encounter (Signed)
Ok'd fill plus one refill to get her to the appt she has 07/09/18 with Kirsteins. I advised her and she is aware she must keep appt for more refills.

## 2018-06-06 ENCOUNTER — Encounter: Payer: Self-pay | Admitting: Physical Medicine & Rehabilitation

## 2018-06-25 ENCOUNTER — Other Ambulatory Visit: Payer: Self-pay | Admitting: Registered Nurse

## 2018-06-26 ENCOUNTER — Other Ambulatory Visit: Payer: Self-pay | Admitting: Physical Medicine & Rehabilitation

## 2018-07-09 ENCOUNTER — Ambulatory Visit: Payer: Medicare Other | Admitting: Physical Medicine & Rehabilitation

## 2018-07-19 ENCOUNTER — Encounter: Payer: Medicare Other | Attending: Physical Medicine & Rehabilitation

## 2018-07-19 ENCOUNTER — Ambulatory Visit: Payer: Medicare Other | Admitting: Physical Medicine & Rehabilitation

## 2018-07-19 ENCOUNTER — Encounter: Payer: Self-pay | Admitting: Physical Medicine & Rehabilitation

## 2018-07-19 VITALS — BP 154/75 | HR 84 | Ht 62.0 in | Wt 160.0 lb

## 2018-07-19 DIAGNOSIS — Z79899 Other long term (current) drug therapy: Secondary | ICD-10-CM | POA: Diagnosis not present

## 2018-07-19 DIAGNOSIS — I10 Essential (primary) hypertension: Secondary | ICD-10-CM | POA: Diagnosis not present

## 2018-07-19 DIAGNOSIS — G6281 Critical illness polyneuropathy: Secondary | ICD-10-CM | POA: Diagnosis not present

## 2018-07-19 DIAGNOSIS — G629 Polyneuropathy, unspecified: Secondary | ICD-10-CM | POA: Diagnosis present

## 2018-07-19 DIAGNOSIS — Z90710 Acquired absence of both cervix and uterus: Secondary | ICD-10-CM | POA: Insufficient documentation

## 2018-07-19 DIAGNOSIS — Z87891 Personal history of nicotine dependence: Secondary | ICD-10-CM | POA: Diagnosis not present

## 2018-07-19 DIAGNOSIS — G7281 Critical illness myopathy: Secondary | ICD-10-CM

## 2018-07-19 DIAGNOSIS — J449 Chronic obstructive pulmonary disease, unspecified: Secondary | ICD-10-CM | POA: Diagnosis not present

## 2018-07-19 DIAGNOSIS — Z8249 Family history of ischemic heart disease and other diseases of the circulatory system: Secondary | ICD-10-CM | POA: Insufficient documentation

## 2018-07-19 DIAGNOSIS — Z9889 Other specified postprocedural states: Secondary | ICD-10-CM | POA: Diagnosis not present

## 2018-07-19 MED ORDER — DULOXETINE HCL 20 MG PO CPEP: 20.0000 mg | ORAL_CAPSULE | Freq: Every day | ORAL | 3 refills | Status: DC

## 2018-07-19 MED ORDER — GABAPENTIN 100 MG PO CAPS: ORAL_CAPSULE | ORAL | 0 refills | Status: DC

## 2018-07-19 MED ORDER — TRAMADOL HCL 50 MG PO TABS: 50.0000 mg | ORAL_TABLET | Freq: Two times a day (BID) | ORAL | 4 refills | Status: DC | PRN

## 2018-07-19 NOTE — Progress Notes (Signed)
Subjective:    Patient ID: Sally [name], female    DOB: [birth year], 67 y.o.   MRN: [id number] 67 year old right-handed female with history of hypertension, COPD with tobacco abuse, who lives with husband, independent prior to admission.  Presented on September 26, 2017, with increasing shortness of breath, cough, required intubation, WBC 21,000.  Placed on broad- spectrum antibiotics.  Prolonged course of intubation requiring tracheostomy, October 10, 2017, slowly downsized and later decannulating, October 27, 2017.  Pneumothorax requiring chest tube that was removed on October 13, 2017.  Diet advanced to regular. Subcutaneous heparin for DVT prophylaxis.  Acute on chronic anemia, 10.9 and monitored.  HPI 67 yo female, with hx of CIP who follows up with compliaints of pins and needle sensation in both feet as well as Right lateral leg.  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  Doing cooking and cleaning around the house.  Seen by ENT diagnosed with reflux and is on a medicine for this  Seeing PCP  Sees optho for iridocyclitis  No falls or injuries since last visit Drives locally  Duloxetine 20mg  per day- pt feels it is helpful Gabapentin 100mg  TID- pt states this is helpful  Some jerking in arms when she is falling asleep Pain Inventory Average Pain 5 Pain Right Now 8 My pain is constant, sharp, burning, stabbing, tingling and aching  In the last 24 hours, has pain interfered with the following? General activity 6 Relation with others 6 Enjoyment of life 6 What TIME of day is your pain at its worst? night Sleep (in general) Good  Pain is worse with: walking, bending, sitting and standing Pain improves with: medication Relief from Meds: 4  Mobility walk without assistance ability to climb steps?  yes do you drive?  yes  Function retired  Neuro/Psych tingling spasms depression anxiety  Prior Studies Any changes since last visit?   no  Physicians involved in your care Any changes since last visit?  no   Family History  Problem Relation Age of Onset  . Hypertension Other    Social History   Socioeconomic History  . Marital status: Married    Spouse name: Not on file  . Number of children: Not on file  . Years of education: Not on file  . Highest education level: Not on file  Occupational History  . Not on file  Social Needs  . Financial resource strain: Not on file  . Food insecurity:    Worry: Not on file    Inability: Not on file  . Transportation needs:    Medical: Not on file    Non-medical: Not on file  Tobacco Use  . Smoking status: Former Games developer  . Smokeless tobacco: Former Neurosurgeon    Quit date: 09/26/2017  Substance and Sexual Activity  . Alcohol use: No  . Drug use: No  . Sexual activity: Not on file  Lifestyle  . Physical activity:    Days per week: Not on file    Minutes per session: Not on file  . Stress: Not on file  Relationships  . Social connections:    Talks on phone: Not on file    Gets together: Not on file    Attends religious service: Not on file    Active member of club or organization: Not on file    Attends meetings of clubs or organizations: Not on file    Relationship status: Not on file  Other Topics Concern  .  Not on file  Social History Narrative  . Not on file   Past Surgical History:  Procedure Laterality Date  . ABDOMINAL HYSTERECTOMY    . CATARACT EXTRACTION, BILATERAL    . FRACTURE SURGERY Left    ARM   Past Medical History:  Diagnosis Date  . Asthma   . Back pain   . COPD (chronic obstructive pulmonary disease) (HCC)   . Crohn disease (HCC)   . Hypertension    There were no vitals taken for this visit.  Opioid Risk Score:   Fall Risk Score:  `1  Depression screen PHQ 2/9  Depression screen PHQ 2/9 12/04/2017  Decreased Interest 0  Down, Depressed, Hopeless 0  PHQ - 2 Score 0  Altered sleeping 3  Tired, decreased energy 3  Change in  appetite 0  Feeling bad or failure about yourself  0  Trouble concentrating 0  Moving slowly or fidgety/restless 0  Suicidal thoughts 0  PHQ-9 Score 6  Difficult doing work/chores Very difficult     Review of Systems  Constitutional: Negative.   HENT: Negative.   Eyes: Negative.   Respiratory: Positive for cough and shortness of breath.   Cardiovascular: Negative.   Gastrointestinal: Negative.   Endocrine: Negative.   Genitourinary: Negative.   Musculoskeletal: Positive for myalgias.  Skin: Negative.   Allergic/Immunologic: Negative.   Neurological: Positive for numbness.  Hematological: Negative.   Psychiatric/Behavioral: Positive for dysphoric mood. The patient is nervous/anxious.   All other systems reviewed and are negative.      Objective:   Physical Exam  Constitutional: She is oriented to person, place, and time. She appears well-developed. No distress.  Eyes: Pupils are equal, round, and reactive to light. EOM are normal.  Neck:  Well-healed tracheostomy incision  Pulmonary/Chest: Effort normal. No stridor. No respiratory distress. She has no wheezes.  Neurological: She is alert and oriented to person, place, and time.  Skin: She is not diaphoretic.  Psychiatric: She has a normal mood and affect.  Nursing note and vitals reviewed. Motor strength is 4/5 bilateral ankle dorsiflexors weaker on the right than the left otherwise 5/5 in bilateral upper and lower limbs  Difficulty heel walking on the right ankle     Assessment & Plan:  1.  Critical illness polyneuropathy.  She has residual neuropathic pain.  Continue gabapentin 200 mg 3 times daily Continue tramadol 50 mg twice daily Continue duloxetine 20 mill grams daily Nurse practitioner visit 6 months Repeat urine drug screen next visit

## 2018-07-30 ENCOUNTER — Other Ambulatory Visit: Payer: Self-pay | Admitting: Physical Medicine & Rehabilitation

## 2018-08-17 ENCOUNTER — Other Ambulatory Visit: Payer: Self-pay | Admitting: Physical Medicine & Rehabilitation

## 2018-11-07 ENCOUNTER — Other Ambulatory Visit: Payer: Self-pay | Admitting: Physical Medicine & Rehabilitation

## 2018-11-08 ENCOUNTER — Other Ambulatory Visit: Payer: Self-pay | Admitting: Physical Medicine & Rehabilitation

## 2019-01-17 ENCOUNTER — Encounter: Payer: Medicare Other | Attending: Registered Nurse | Admitting: Registered Nurse

## 2019-01-17 ENCOUNTER — Encounter: Payer: Self-pay | Admitting: Registered Nurse

## 2019-01-17 ENCOUNTER — Other Ambulatory Visit: Payer: Self-pay

## 2019-01-17 VITALS — Ht 61.0 in | Wt 160.0 lb

## 2019-01-17 DIAGNOSIS — G6281 Critical illness polyneuropathy: Secondary | ICD-10-CM | POA: Diagnosis not present

## 2019-01-17 DIAGNOSIS — Z79899 Other long term (current) drug therapy: Secondary | ICD-10-CM

## 2019-01-17 DIAGNOSIS — J449 Chronic obstructive pulmonary disease, unspecified: Secondary | ICD-10-CM

## 2019-01-17 DIAGNOSIS — G894 Chronic pain syndrome: Secondary | ICD-10-CM

## 2019-01-17 DIAGNOSIS — G7281 Critical illness myopathy: Secondary | ICD-10-CM

## 2019-01-17 DIAGNOSIS — R29898 Other symptoms and signs involving the musculoskeletal system: Secondary | ICD-10-CM | POA: Diagnosis not present

## 2019-01-17 DIAGNOSIS — Z5181 Encounter for therapeutic drug level monitoring: Secondary | ICD-10-CM

## 2019-01-17 MED ORDER — TRAMADOL HCL 50 MG PO TABS: 50.0000 mg | ORAL_TABLET | Freq: Two times a day (BID) | ORAL | 5 refills | Status: DC | PRN

## 2019-01-17 MED ORDER — GABAPENTIN 100 MG PO CAPS: ORAL_CAPSULE | ORAL | 2 refills | Status: DC

## 2019-01-17 MED ORDER — DULOXETINE HCL 20 MG PO CPEP: 20.0000 mg | ORAL_CAPSULE | Freq: Every day | ORAL | 3 refills | Status: DC

## 2019-01-17 NOTE — Progress Notes (Signed)
Subjective:    Patient ID: Sally [name], female    DOB: Jul 04, 1951, 68 y.o.   MRN: [id number]  HPI: Sally [name] is a 68 y.o. female her appointment was changed, due to national recommendations of social distancing due to COVID 19, an audio/video telehealth visit is felt to be most appropriate for this patient at this time.  See Chart message from today for the patient's consent to telehealth from [facility].    She  states her pain is located in her lower back and occassionally radiating into her left lower extremity, reports neuropathic pain in her bilateral lower extremities and bilateral feet. She rates her pain 8. Her current exercise regime is walking.   Sally [name] equivalent is 10.00 MME.  Last Oral Swab was Performed on 12/04/2017, it was consistent.   Sally [name] CMA asked the Health and History Questions. This provider and Sally [name]  verified we were speaking with the correct person using two identifiers.  Pain Inventory Average Pain 8 Pain Right Now 8 My pain is intermittent, constant, sharp, burning and stabbing  In the last 24 hours, has pain interfered with the following? General activity 8 Relation with others 8 Enjoyment of life 8 What TIME of day is your pain at its worst? night Sleep (in general) Good  Pain is worse with: walking, bending, standing and some activites Pain improves with: rest, heat/ice, pacing activities and medication Relief from Meds: 10  Mobility walk without assistance ability to climb steps?  yes do you drive?  yes  Function retired  Neuro/Psych numbness tingling spasms depression anxiety loss of taste or smell  Prior Studies Any changes since last visit?  no  Physicians involved in your care Any changes since last visit?  no   Family History  Problem Relation Age of Onset  . Hypertension Other    Social History   Socioeconomic History  . Marital status: Married    Spouse name: Not on file  . Number of children: Not on file  . Years of education: Not on file  . Highest education level: Not on file  Occupational History  . Not on file  Social Needs  . Financial resource strain: Not on file  . Food insecurity:    Worry: Not on file    Inability: Not on file  . Transportation needs:    Medical: Not on file    Non-medical: Not on file  Tobacco Use  . Smoking status: Former Games developer  . Smokeless tobacco: Former Neurosurgeon    Quit date: 09/26/2017  Substance and Sexual Activity  . Alcohol use: No  . Drug use: No  . Sexual activity: Not on file  Lifestyle  . Physical activity:    Days per week: Not on file    Minutes per session: Not on file  . Stress: Not on file  Relationships  . Social connections:    Talks on phone: Not on file    Gets together: Not on file    Attends religious service: Not on file    Active member of club or organization: Not on file    Attends meetings of clubs or organizations: Not on file    Relationship status: Not on file  Other Topics Concern  . Not on file  Social History Narrative  . Not on file   Past Surgical History:  Procedure Laterality Date  . ABDOMINAL HYSTERECTOMY    . CATARACT EXTRACTION, BILATERAL    .  FRACTURE SURGERY Left    ARM   Past Medical History:  Diagnosis Date  . Asthma   . Back pain   . COPD (chronic obstructive pulmonary disease) (HCC)   . Crohn disease (HCC)   . Hypertension    There were no vitals taken for this visit.  Opioid Risk Score:   Fall Risk Score:  `1  Depression screen PHQ 2/9  Depression screen PHQ 2/9 12/04/2017  Decreased Interest 0  Down, Depressed, Hopeless 0  PHQ - 2 Score 0  Altered sleeping 3  Tired, decreased energy 3  Change in appetite 0  Feeling bad or failure about yourself  0  Trouble concentrating 0  Moving slowly or fidgety/restless 0  Suicidal thoughts 0  PHQ-9 Score 6  Difficult doing work/chores Very difficult     Review of Systems   Constitutional: Negative.   HENT: Negative.        States everything has metallic taste  Eyes: Negative.   Respiratory: Positive for cough, shortness of breath and wheezing.   Cardiovascular: Negative.   Gastrointestinal: Negative.   Endocrine: Negative.   Genitourinary: Negative.   Musculoskeletal: Positive for arthralgias and myalgias.  Allergic/Immunologic: Negative.   Neurological: Negative.   Hematological: Negative.   Psychiatric/Behavioral: Positive for dysphoric mood. The patient is nervous/anxious.   All other systems reviewed and are negative.      Objective:   Physical Exam Vitals signs and nursing note reviewed.  Musculoskeletal:     Comments: No Physical Exam Performed: Virtual Visit  Neurological:     Mental Status: She is oriented to person, place, and time.           Assessment & Plan:  1. Critical Illness poly- Neuropathy:Continue Gabapentin 100 mg takes 2 capsules  three times a day and Duloxetine 20 mg daily.  Refill: Tramadol 50 mg one tablet twice daily as needed #60.  We will continue the opioid monitoring program, this consists of regular clinic visits, examinations, urine drug screen, pill counts as well as use of [place] Controlled Substance Reporting system.  Telephone Call Location of patient: Home Location of provider: Office Established patient Time spent on call: 10 Minutes

## 2019-02-13 ENCOUNTER — Other Ambulatory Visit: Payer: Self-pay | Admitting: Physical Medicine & Rehabilitation

## 2019-02-14 ENCOUNTER — Other Ambulatory Visit: Payer: Self-pay | Admitting: Physical Medicine & Rehabilitation

## 2019-04-11 ENCOUNTER — Other Ambulatory Visit: Payer: Self-pay | Admitting: Registered Nurse

## 2019-05-18 ENCOUNTER — Other Ambulatory Visit: Payer: Self-pay | Admitting: Physical Medicine & Rehabilitation

## 2019-07-08 IMAGING — DX DG CHEST 1V PORT
1 series · 1 of 1 positions shown · non-contrast
Comparison: Radiograph September 28, 2017.

CLINICAL DATA: Status post intubation.

EXAM:
PORTABLE CHEST 1 VIEW

[chest ap]
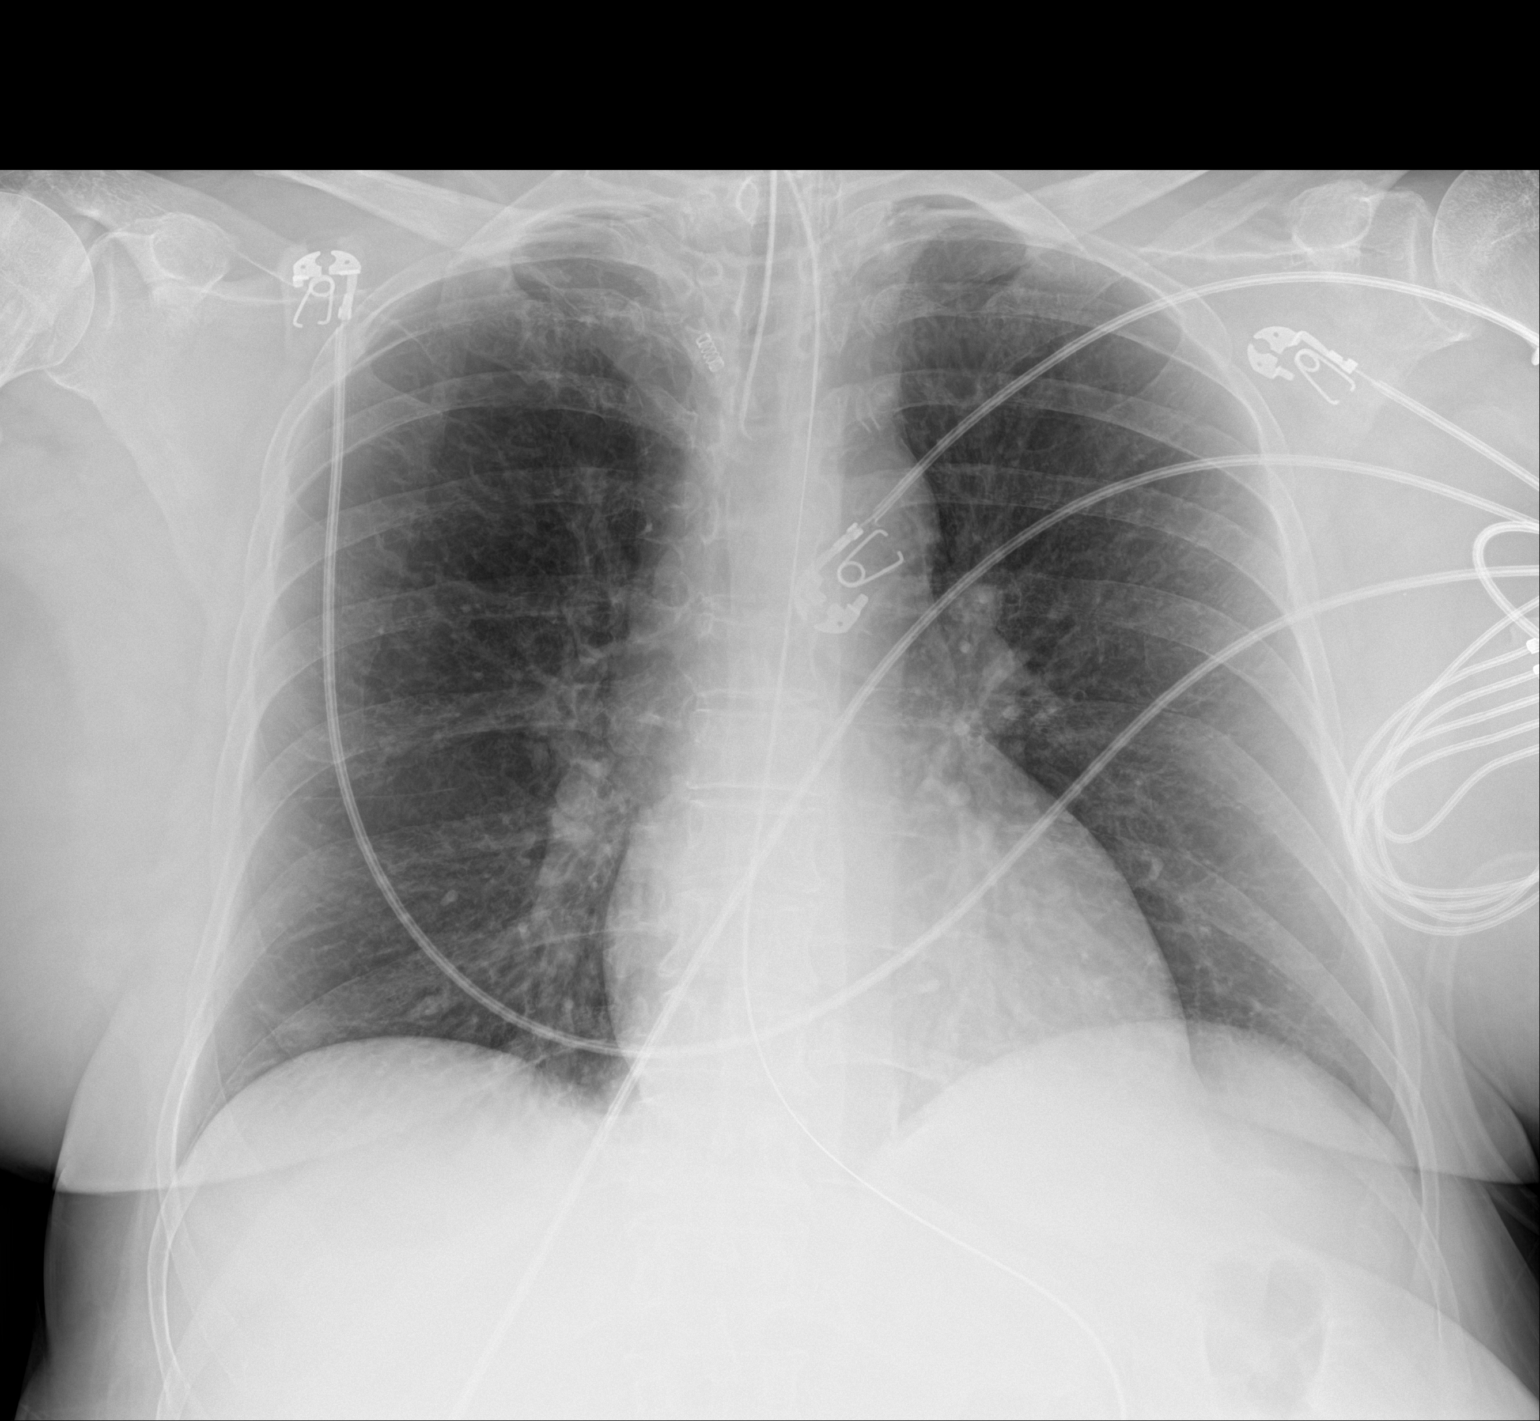

[1 of 1 positions shown; findings below may reference images not displayed]

FINDINGS: The heart size and mediastinal contours are within normal limits.
Endotracheal and nasogastric tubes are unchanged in position. No
pneumothorax or pleural effusion is noted. Both lungs are clear. The
visualized skeletal structures are unremarkable.
IMPRESSION: Status post support apparatus. No acute cardiopulmonary abnormality
seen.

## 2019-07-09 IMAGING — DX DG CHEST 1V PORT
1 series · 1 of 1 positions shown · non-contrast
Comparison: 09/29/2017 and prior radiographs

CLINICAL DATA: Respiratory failure

EXAM:
PORTABLE CHEST 1 VIEW

[chest]
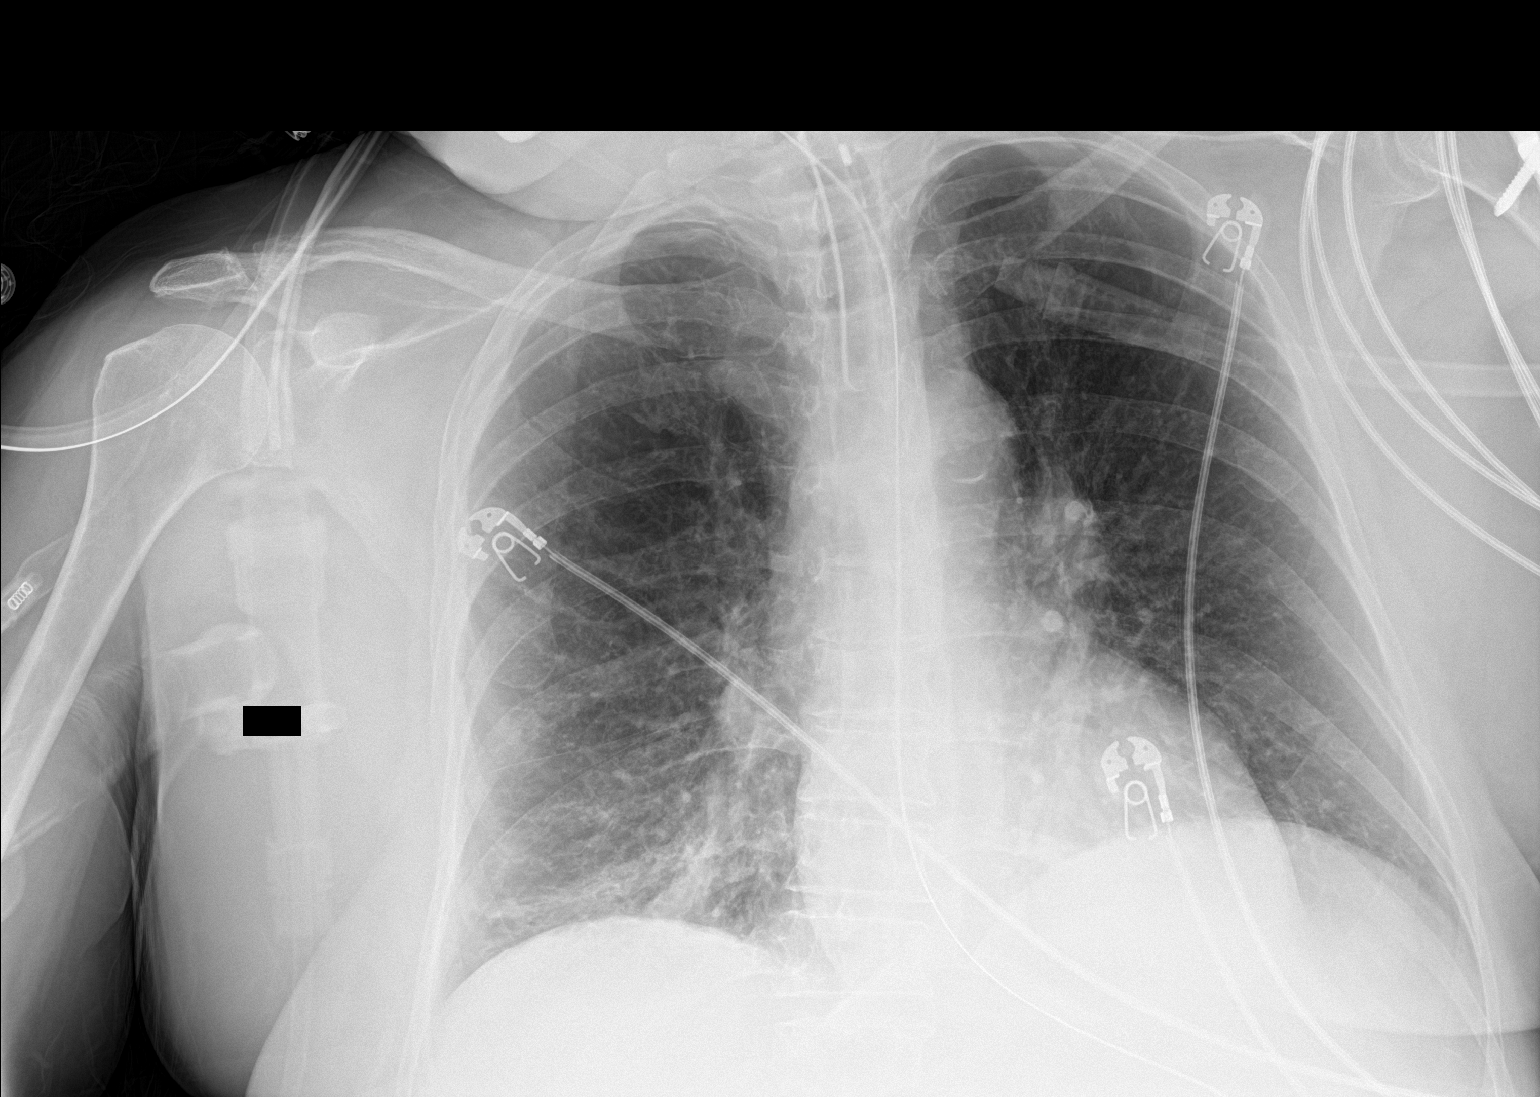

[1 of 1 positions shown; findings below may reference images not displayed]

FINDINGS: An endotracheal tube with tip 4.2 cm above the carina and NG tube
entering the stomach with tip off the field of view again noted.

The cardiomediastinal silhouette is unremarkable.

Right basilar atelectasis/airspace disease is more apparent today.

No evidence of pleural effusion or pneumothorax.
IMPRESSION: More apparent right basilar atelectasis versus airspace disease.

No other significant change.

## 2019-07-10 IMAGING — DX DG CHEST 1V PORT
1 series · 1 of 1 positions shown · non-contrast
Comparison: 09/30/2017

CLINICAL DATA: Respiratory failure

EXAM:
PORTABLE CHEST 1 VIEW

[chest ap]
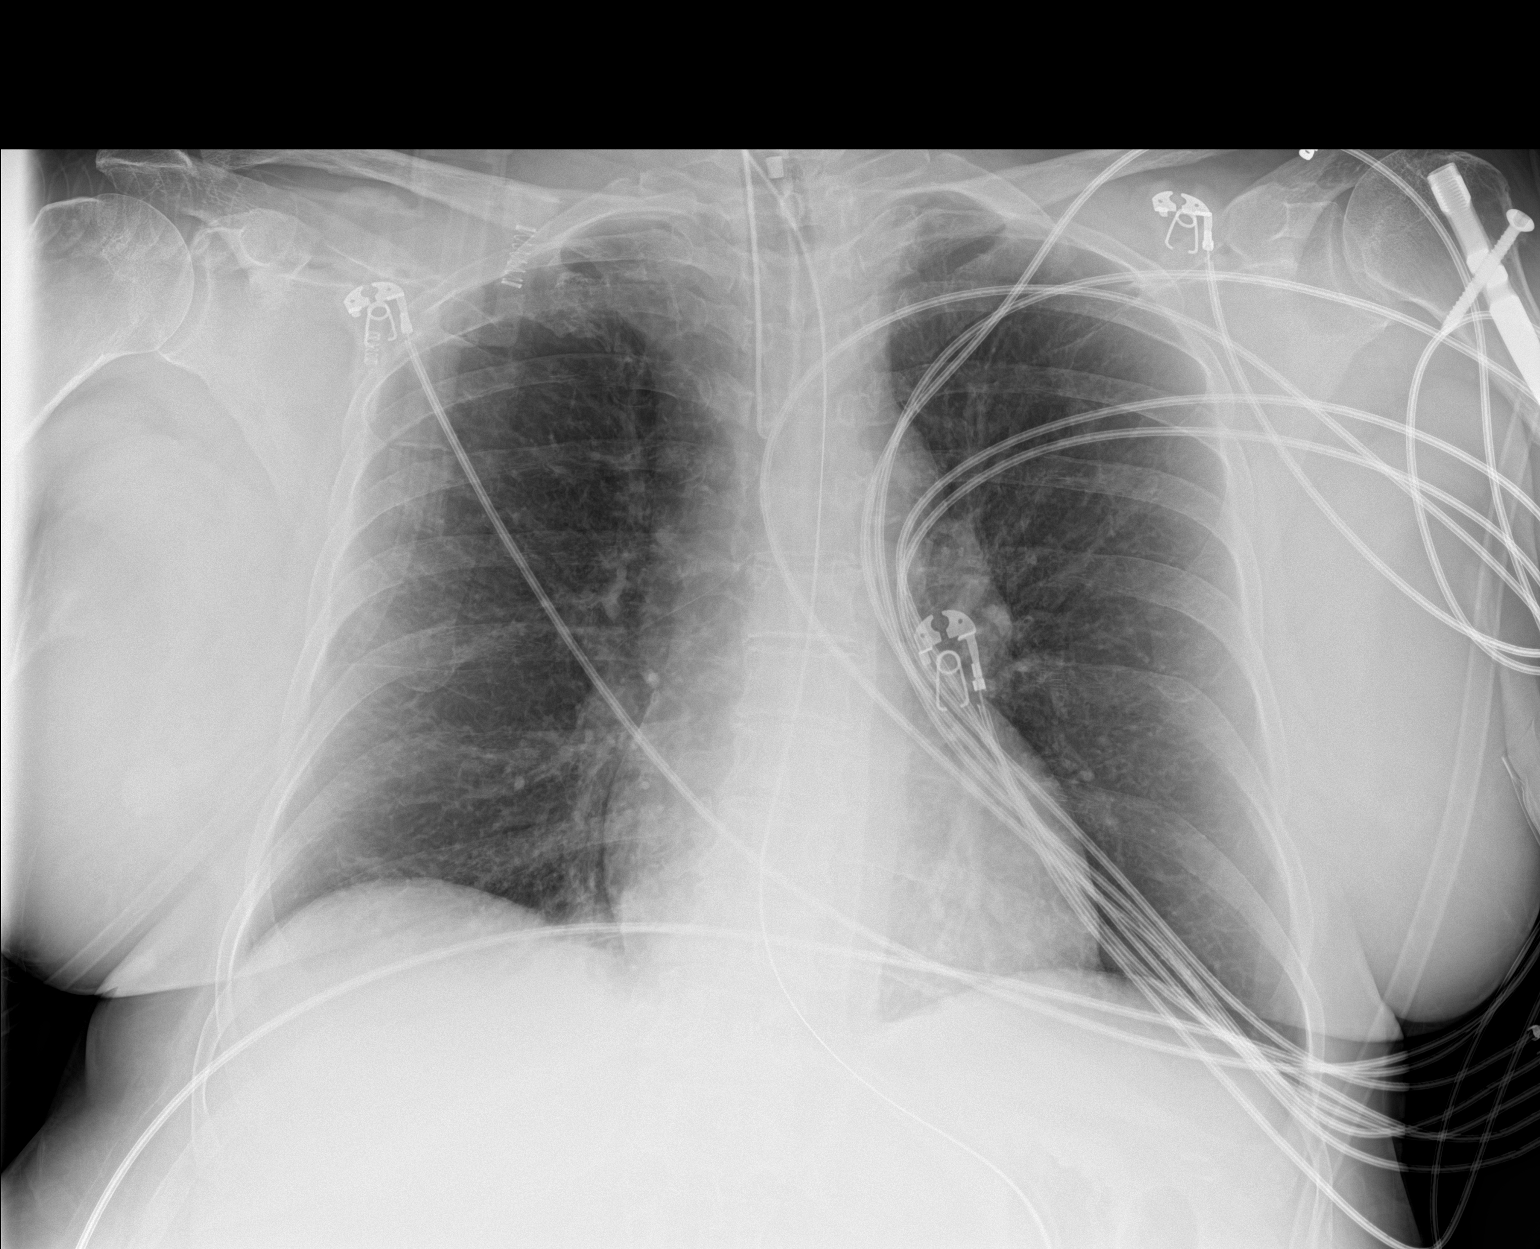

[1 of 1 positions shown; findings below may reference images not displayed]

FINDINGS: An endotracheal tube with tip 4 cm above the carina and NG tube
entering the stomach with tip off the field of view again noted.

Decreased right basilar opacity/atelectasis.

No pneumothorax or definite pleural effusion.

No acute bony abnormalities.
IMPRESSION: Decreased right basilar opacity/atelectasis without other
significant change.

## 2019-07-12 ENCOUNTER — Other Ambulatory Visit: Payer: Self-pay | Admitting: Registered Nurse

## 2019-07-13 IMAGING — DX DG CHEST 1V PORT
1 series · 1 of 1 positions shown · non-contrast
Comparison: 10/03/2017.

CLINICAL DATA: Encounter for intubation.

EXAM:
PORTABLE CHEST 1 VIEW

[chest]
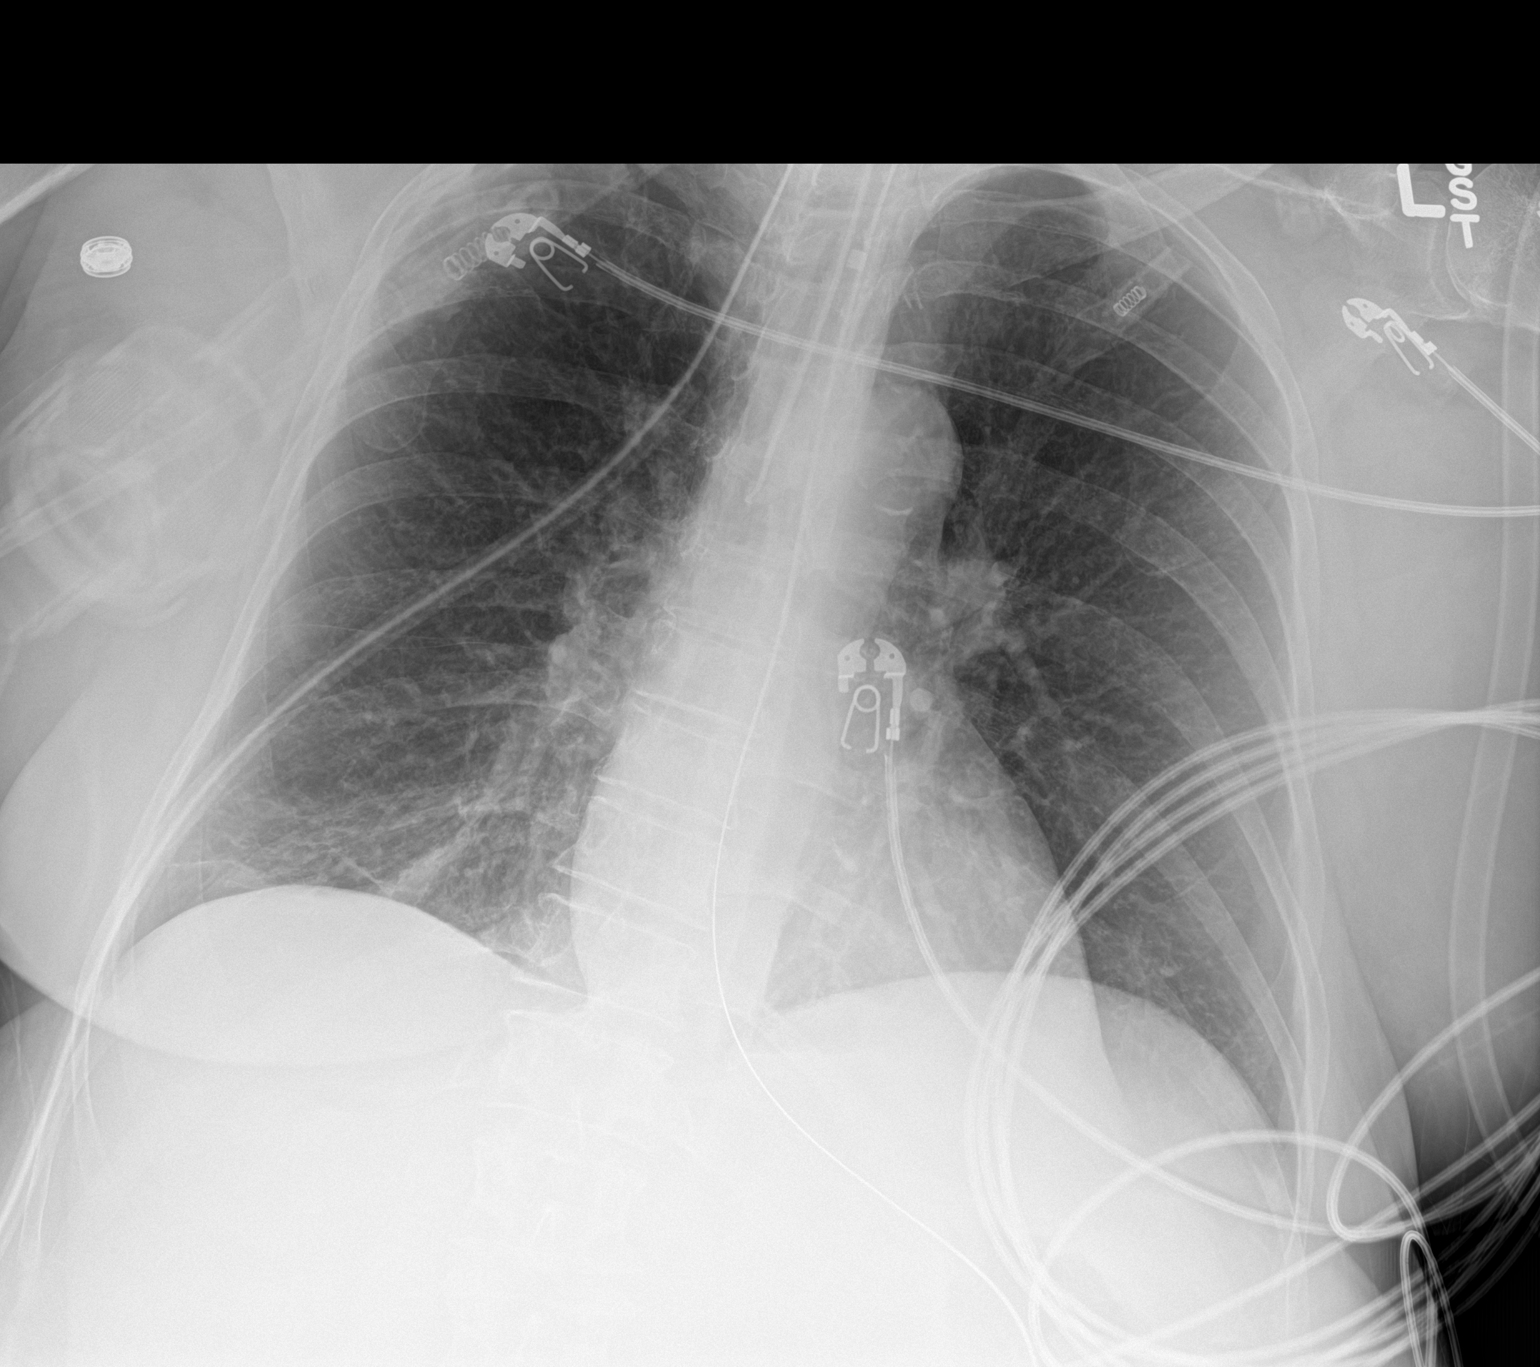

[1 of 1 positions shown; findings below may reference images not displayed]

FINDINGS: ET tube slightly low, 1.8 cm above carina. Normal cardiomediastinal
silhouette. No consolidation or edema. RIGHT base atelectasis,
platelike. Orogastric tube tip lies within the stomach or beyond,
below the radiographic exposure.
IMPRESSION: No consolidation or edema. ET tube slightly low. RIGHT base
atelectasis, stable.

## 2019-07-15 ENCOUNTER — Ambulatory Visit: Payer: Medicare Other | Admitting: Registered Nurse

## 2019-07-15 ENCOUNTER — Encounter: Payer: Medicare Other | Admitting: Physical Medicine & Rehabilitation

## 2019-07-15 IMAGING — DX DG CHEST 1V PORT
1 series · 1 of 1 positions shown · non-contrast
Comparison: 10/05/2017

CLINICAL DATA: Endotracheal tube placement

EXAM:
PORTABLE CHEST 1 VIEW

[chest]
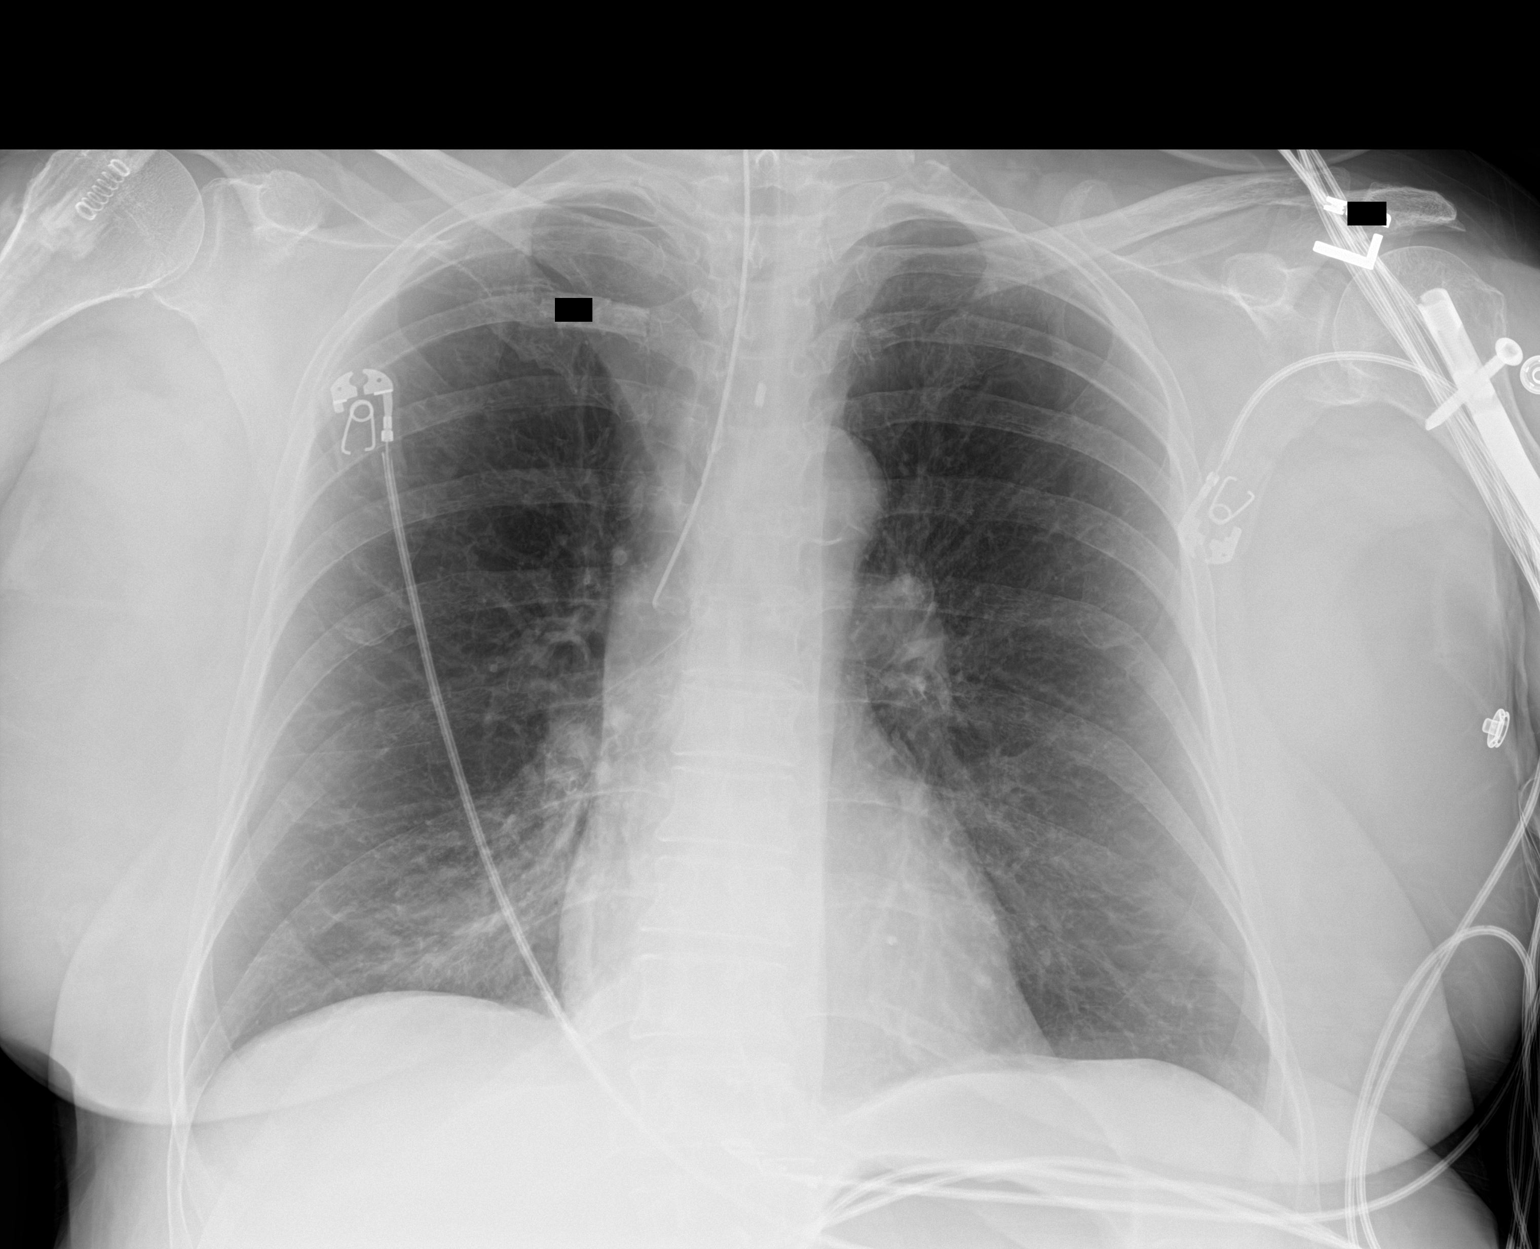

[1 of 1 positions shown; findings below may reference images not displayed]

FINDINGS: The endotracheal tube tip is in the right mainstem bronchus.
Retraction of 4.5 cm is recommended.

The prior plate like atelectasis at the right lung base has
resolved. Mild interstitial accentuation, as can commonly be
encountered in smokers. Atherosclerotic calcification of the aortic
arch. Heart size within normal limits. No airspace opacity
identified.
IMPRESSION: 1. The endotracheal tube tip is in the right mainstem bronchus.
Retraction of 4.5 cm recommended.
2.  Aortic Atherosclerosis ([id number]).

Critical Value/emergent results were called by telephone at the time
of interpretation on 10/06/2017 at [DATE] to clinical team member
Rajveer, who verbally acknowledged these results.

## 2019-08-08 ENCOUNTER — Encounter: Payer: Medicare Other | Attending: Physical Medicine & Rehabilitation | Admitting: Physical Medicine & Rehabilitation

## 2019-08-13 ENCOUNTER — Other Ambulatory Visit: Payer: Self-pay | Admitting: Registered Nurse

## 2019-08-14 ENCOUNTER — Other Ambulatory Visit: Payer: Self-pay | Admitting: Registered Nurse

## 2019-08-29 ENCOUNTER — Encounter: Payer: Medicare Other | Attending: Physical Medicine & Rehabilitation | Admitting: Physical Medicine & Rehabilitation

## 2022-07-06 ENCOUNTER — Encounter: Payer: Self-pay | Admitting: Internal Medicine

## 2022-07-06 ENCOUNTER — Non-Acute Institutional Stay: Payer: Self-pay | Admitting: Internal Medicine

## 2022-07-06 VITALS — BP 138/58 | HR 104 | Temp 97.0°F | Resp 20 | Ht 62.0 in | Wt 162.2 lb

## 2022-07-06 DIAGNOSIS — J449 Chronic obstructive pulmonary disease, unspecified: Secondary | ICD-10-CM

## 2022-07-06 DIAGNOSIS — Z515 Encounter for palliative care: Secondary | ICD-10-CM

## 2022-07-06 DIAGNOSIS — G4733 Obstructive sleep apnea (adult) (pediatric): Secondary | ICD-10-CM

## 2022-07-06 DIAGNOSIS — J9612 Chronic respiratory failure with hypercapnia: Secondary | ICD-10-CM

## 2022-07-06 NOTE — Progress Notes (Signed)
[facility] Palliative Care Consult Note Telephone: [phone]  Fax: [phone]   Date of encounter: 07/23/22 9:25 AM PATIENT NAME: Sally [name] [place] [address]   [phone] (home)  DOB: [birth year] MRN: [id number] PRIMARY CARE PROVIDER:    [name], MD,  [address] [place]  [address] [phone]  REFERRING PROVIDER:   Elmore Guise, [place] [address]  [place] [address] [phone]  RESPONSIBLE PARTY:    Contact Information     Name Relation Home Work Mobile   Bunn,Herbert Spouse Pella, Brooke Bonito Son   [phone]   Lovena Neighbours Daughter   [phone]        I met face to face with patient and family in [place] facility. Palliative Care was asked to follow this patient by consultation request of  Demarchi, Satira Anis, MD to address advance care planning and complex medical decision making. This is the initial visit.                                     ASSESSMENT AND PLAN / RECOMMENDATIONS:   Advance Care Planning/Goals of Care: Goals include to maximize quality of life and symptom management. Patient/health care surrogate gave his/her permission to discuss.Our advance care planning conversation included a discussion about:    The value and importance of advance care planning  Experiences with loved ones who have been seriously ill or have died  Exploration of personal, cultural or spiritual beliefs that might influence medical decisions  Exploration of goals of care in the event of a sudden injury or illness  Identification  of a healthcare agent--husband, Voncille Simm [phone] then daughter Barnett Applebaum [phone] Review and updating or creation of an  advance directive document . Decision not to resuscitate or to de-escalate disease focused treatments due to poor prognosis. CODE STATUS: full code; MOST completed  with her and her husband, Kennith Center, who was present for visit, as well:  FULL CODE, FULL SCOPE with mechanical ventilation as long as brain function remains, antibiotics if indicated, IVF if indicated, feeding tube long-term if indicated as expressed by patient during visit today  Symptom Management/Plan: 1. Chronic hypercapnic respiratory failure (HCC) -was not sent with bipap machine to facility though it was used and recommended there -asked nurse to follow-up on this with O2 supplier asap  -pt's goals are to prolong her life as long as her brain is functional and husband is supportive of her wishes  2. Chronic obstructive pulmonary disease, unspecified COPD type ([place]) -advanced with considerable hypercapnea and fairly good sats with goal 88-95% which has been maintained here  3. OSA (obstructive sleep apnea) -for sleep study   4. Palliative care by specialist -pt expresses full interventions desired should she decompensate again -she did ask about her prognosis which I did share that in my opinion it could be less than 6 months especially if she does not get and use the bipap machine for hypercapnia and sleep apnea    Follow up Palliative Care Visit: Palliative care will continue to follow for complex medical decision making, advance care planning, and clarification of goals. Return 4-6 wks   This visit was coded based on medical decision making (MDM).50 mins spent on ACP  PPS: 40%  HOSPICE ELIGIBILITY/DIAGNOSIS: Yes, but not ready/COPD with hypercapnic resp failure  Chief Complaint: Initial palliative care consult at [facility]  HISTORY OF PRESENT ILLNESS:  Sally [name] is a 71 y.o. year old female  with chronic respiratory failure secondary to gold stage 3 COPD bordering on stage 4 with FEV1 34%, OSA suspected, tobacco abuse, Crohn's disease, critical illness neuropathy, GAD, adjustment disorder with depressed mood, stress incontinence, hyperlipidemia, and vitamin D deficiency  admitted to [facility] for rehab 07/04/22 s/p hospitalization at [facility] beginning 06/14/22 with acute on chronic respiratory failure with hypercapnea with COPD exacerbation and chronic laryngotracheitis. She had lethargy and required bipap and precedex to tolerate it.  Was given solumedrol and abx, nightly bipap recommended, transitioned to prednisone taper, had delirium related to hypercapnea, was started on b12 injections and oral supplementation, also weekly D supplement.  Notes from discharge indicate that discussion was held indicating that if she declines, she may be hospice eligible.    Using 2L O2.  I note some tachycardia on her vitals here.   Met with pt and her husband.  C/o neuropathy, ongoing episodes of confusion at times--does not have bipap machine used at hospital.  Has been here since 10/9 and it's now 10/11.    She's eating better though not a fan of food here.  Wants coffee with each meal.  History obtained from review of EMR, discussion with primary team, and interview with family, facility staff/caregiver and/or Ms. Mathenia.   I reviewed available labs, medications, imaging, studies and related documents from the EMR.  Records reviewed and summarized above.   ROS Review of Systems  Constitutional:  Positive for fatigue. Negative for activity change, appetite change, chills and fever.  HENT:  Negative for congestion.   Eyes:  Negative for visual disturbance.  Respiratory:  Positive for shortness of breath. Negative for chest tightness.   Cardiovascular:  Negative for chest pain and leg swelling.  Gastrointestinal:  Negative for constipation and nausea.  Genitourinary:  Negative for dysuria.  Musculoskeletal:  Positive for gait problem and myalgias.  Skin:  Positive for pallor.  Neurological:  Positive for weakness and numbness.  Psychiatric/Behavioral:  Positive for confusion.     Physical Exam: Vitals:   07/06/22 0912  BP: (!) 138/58  Pulse: (!) 104   Resp: 20  Temp: (!) 97 F (36.1 C)  SpO2: 93%  Weight: 162 lb 3.2 oz (73.6 kg)  Height: 5' 2"  (1.575 m)   Body mass index is 29.67 kg/m. Wt Readings from Last 500 Encounters:  07/06/22 162 lb 3.2 oz (73.6 kg)  01/17/19 160 lb (72.6 kg)  07/19/18 160 lb (72.6 kg)  01/05/18 165 lb (74.8 kg)  11/17/17 161 lb 6 oz (73.2 kg)  10/26/17 152 lb 12.5 oz (69.3 kg)  10/06/16 165 lb 6.4 oz (75 kg)  07/14/16 168 lb 3.4 oz (76.3 kg)   Physical Exam Constitutional:      General: She is not in acute distress.    Appearance: She is obese. She is ill-appearing.  HENT:     Head: Normocephalic and atraumatic.     Right Ear: External ear normal.     Left Ear: External ear normal.  Eyes:     Extraocular Movements: Extraocular movements intact.     Pupils: Pupils are equal, round, and reactive to light.  Cardiovascular:     Rate and Rhythm: Regular rhythm. Tachycardia present.  Pulmonary:     Effort: Pulmonary effort is normal.     Breath sounds: Wheezing present.  Abdominal:  General: Bowel sounds are normal. There is no distension.     Palpations: Abdomen is soft.     Tenderness: There is no abdominal tenderness.  Musculoskeletal:        General: Normal range of motion.     Right lower leg: Edema present.     Left lower leg: Edema present.  Skin:    Coloration: Skin is pale.  Neurological:     General: No focal deficit present.     Mental Status: She is alert and oriented to person, place, and time.     Motor: Weakness present.     Gait: Gait abnormal.     Comments: Using manual wheelchair  Psychiatric:        Mood and Affect: Mood normal.        Behavior: Behavior normal.     CURRENT PROBLEM LIST:  Patient Active Problem List   Diagnosis Date Noted   Adjustment disorder with depressed mood    Acute blood loss anemia    Chronic obstructive pulmonary disease (HCC)    Deep venous thrombosis (HCC)    Critical illness myopathy    Critical illness neuropathy (HCC)     Leukocytosis    Tracheostomy status (HCC)    Hypoxemia    Pneumothorax, traumatic    Acute respiratory failure (HCC)    Influenza A    Acute on chronic respiratory failure (HCC)    Acute respiratory failure with hypoxemia ([place]) 09/26/2017   RSV (acute bronchiolitis due to respiratory syncytial virus) 10/06/2016   COPD exacerbation ([place]) 07/14/2016   Acute respiratory failure with hypoxia and hypercapnia (HCC)    Essential hypertension    Dyslipidemia    PAST MEDICAL HISTORY:  Active Ambulatory Problems    Diagnosis Date Noted   COPD exacerbation ([place]) 07/14/2016   Acute respiratory failure with hypoxia and hypercapnia (HCC)    Essential hypertension    Dyslipidemia    RSV (acute bronchiolitis due to respiratory syncytial virus) 10/06/2016   Acute respiratory failure with hypoxemia ([place]) 09/26/2017   Acute on chronic respiratory failure (HCC)    Influenza A    Acute respiratory failure (HCC)    Pneumothorax, traumatic    Tracheostomy status (HCC)    Hypoxemia    Critical illness myopathy    Critical illness neuropathy (HCC)    Leukocytosis    Acute blood loss anemia    Chronic obstructive pulmonary disease (HCC)    Deep venous thrombosis (HCC)    Adjustment disorder with depressed mood    Resolved Ambulatory Problems    Diagnosis Date Noted   No Resolved Ambulatory Problems   Past Medical History:  Diagnosis Date   Asthma    Back pain    COPD (chronic obstructive pulmonary disease) ([place])    Crohn disease ([place])    Hypertension    SOCIAL HX:  Social History   Tobacco Use   Smoking status: Former   Smokeless tobacco: Former    Quit date: 09/26/2017  Substance Use Topics   Alcohol use: No   FAMILY HX:  Family History  Problem Relation Age of Onset   Hypertension Other       ALLERGIES: No Known Allergies    PERTINENT MEDICATIONS:   MED PREDNISONE 10 MG TABLET :( 12 DAY DOSEPACK Day 9 -12 ) Give 1 tablet by mouth before breakfast,and at Bedtime .For Acute  on Chronic hypercarbic Respiratory Failure DeMarchi, William 07/13/2022 12:00 AM MED PREDNISONE 10 MG TABLET :( 12 DAY DOSEPACK Day  5-8 ) Give 1 tablet by mouth before breakfast, lunch and Supper and at Bedtime .For Acute on Chronic hypercarbic Respiratory Failure DeMarchi, William 07/09/2022 12:00 AM MED ASPIRIN EC 81 MG TABLET TAKE 1 TABLET BY MOUTH ONCE DAILY (DO NOT CRUSH) DeMarchi, William 07/06/2022 10:00 AM MED VITAMIN B-12 500 MCG TABLET TAKE 1 TABLET BY MOUTH ONCE DAILY FOR LOW VITAMIN B12 DeMarchi, William 07/06/2022 10:00 AM MED LOSARTAN POTASSIUM 25 MG TAB TAKE 1 TABLET BY MOUTH ONCE DAILY **SKIP DOSE IF SBP <140MMHG* (RtP) Essential (primary) hypertension (I10) DeMarchi, William 07/06/2022 10:00 AM MED ATORVASTATIN 20 MG TABLET TAKE 1 TABLET BY MOUTH ONCE DAILY Hyperlipidemia, unspecified (E78.5) DeMarchi, William 07/05/2022 10:00 PM MED OMEPRAZOLE DR 40 MG CAPSULE TAKE 1 CAPSULE BY MOUTH 30 MINUTES BEFORE BREAKFAST AND 30 MINUTES BEFORE BEDTIME (DO NOT CRUSH) DeMarchi, William 07/05/2022 10:00 PM MED HUMALOG 100 UNIT/ML KWIKPEN:CBG ac meals with SSI: 70-120=0 units, 121-150=1 unit, 151-200=2 units, 201-250=3 units, 251-300=5 units, 301-350= 7 units, 351-400=9 units, greater than 400= 9units DeMarchi, William 07/05/2022 6:00 PM MED GABAPENTIN 100 MG CAPSULE TAKE 2 CAPSULES (200MG) BY MOUTH THREE TIMES A DAY FOR NEUROPATHY (DO NOT CRUSH) DeMarchi, William 07/05/2022 4:00 PM MED CYCLOBENZAPRINE 5 MG TABLET TAKE 1 TABLET BY MOUTH THREE TIMES DAILY AS NEEDED FOR MUSCLE SPASMS DeMarchi, William 07/05/2022 11:00 AM MED MUCUS RELIEF ER 600 MG TABLET TAKE 1 TABLET BY MOUTH TWICE A DAY FOR COUGH (DO NOT CRUSH) DeMarchi, William 07/05/2022 11:00 AM MED ALBUTEROL SUL 2.5 MG/3 ML SOLN INHALE 1 VIAL VIA NEBULIZER EVERY 6 HOURS AS NEEDED FOR BREATHING DeMarchi, William 07/05/2022 11:00 AM MED BISACODYL EC 5 MG TABLET TAKE 2 TABLETS (10MG) BY MOUTH EVERY DAY AS NEEDED  FOR CONSTIPATION (DO NOT CRUSH) DeMarchi, William 07/05/2022 11:00 AM MED VITAMIN D2 1.25MG(50,000 UNIT) TAKE 1 CAPSULE BY MOUTH WEEKLY ON TUESDAY (DO NOT CRUSH) DeMarchi, William 07/05/2022 11:00 AM MED ALBUTEROL HFA 90 MCG INHALER INHALE 2 PUFFS INTO LUNGS EVERY 6 HOURS AS NEEDED FOR WHEEZING /SOB Chronic obstructive pulmonary disease w (acute) exacerbation (J44.1) DeMarchi, William 07/05/2022 11:00 AM MED DULOXETINE HCL DR 30 MG CAP TAKE 1 CAPSULE BY MOUTH ONCE DAILY **DO NOT CRUSH* Adjustment disorder with depressed mood (F43.21) DeMarchi, William 07/05/2022 11:00 AM MED HYDROXYZINE PAM 25 MG CAP TAKE 1 CAPSULE BY MOUTH TWICE DAILY AS NEEDED FOR ANXIETY FOR 14 DAYS (DO NOT CRUSH) DeMarchi, William 07/05/2022 11:00 AM MED IPRAT-ALBUT 0.5-3(2.5) MG/3 ML INHALE 1 VIAL VIA NEBULIZER EVERY 6 HOURS AS NEEDED FOR WHEEZING DeMarchi, William 07/05/2022 11:00 AM MED PREDNISONE 10 MG TABLET( 12 DAY DOSEPACK : Day 1-4 Give 2 tablets ( 20 mg total ) by mouth before breakfast For Acute on Chronic hypercarbic Respiratory Failure DeMarchi, William 07/05/2022 12:00 AM MED PREDNISONE 10 MG TABLET ( 12 DAY DOSEPACK Day 1-4 )Give 1 tablet by mouth after lunch and Supper .For Acute on Chronic hypercarbic Respiratory Failure DeMarchi, William 07/05/2022 12:00 AM MED PREDNISONE 10 MG TABLET:( 12 DAY DOSEPACK Day 1-4 ) Give 2 tablets ( 20 mg toal ) by mouth at Bedtime .For Acute on Chronic hypercarbic Respiratory Failure DeMarchi, William 07/05/2022 12:00 AM MED TRELEGY ELLIPTA 100-62.5-25:Inhale 1 puff into the lungs daily for COPD *Gargle, rinse your mouth with water and spit out after each use. Do not swallow the rinse water. DeMarchiGwyndolyn Saxon 07/04/2022 5:32 PM MED MULTIVITAMIN WITH MINERALS ATF:TDDU 1 tablet mouth daily DeMarchi, Gwyndolyn Saxon 07/04/2022 5:03 PM MED DILTIAZEM 24H ER(LA) 180 MG KG:URKY 1 tablet by mouth daily for Essential HTN  DeMarchiGwyndolyn Saxon 07/04/2022 4:48  PM MED ACETAMINOPHEN 325 MG TABLET :Give 2 tablets ( 650 mg total ) by mouth every 4 hours as needed for Pain Madison Hickman 07/04/2022 4:24 PM MED Constipation (2 of 4): If not relieved by MOM, give 10 mg Bisacodyl suppositiory rectally X 1 dose in 24 hours as needed (Do not use constipation standing orders for residents with renal failure/CFR less than 30. Contact MD for orders) (Physician Order) Madison Hickman 07/04/2022 3:31 PM MED Constipation (3 of 4): If not relieved by Biscodyl suppository, give disposable Saline Enema rectally X 1 dose/24 hrs as needed (Do not use constipation standing orders for residents with renal failure/CFR less than 30. Contact MD for orders)(Physician Or Madison Hickman 07/04/2022 3:31 PM  Thank you for the opportunity to participate in the care of Ms. Biehn.  The palliative care team will continue to follow. Please call our office at [phone] if we can be of additional assistance.   Hollace Kinnier, DO  COVID-19 PATIENT SCREENING TOOL Asked and negative response unless otherwise noted:  Have you had symptoms of covid, tested positive or been in contact with someone with symptoms/positive test in the past 5-10 days?  NO
